# Patient Record
Sex: Male | Born: 1955 | Race: White | Hispanic: No | Marital: Married | Smoking: Current every day smoker
Health system: Southern US, Community
[De-identification: ages and names within clinical notes are randomized; demographics above are authoritative.]

## PROBLEM LIST (undated history)

## (undated) DIAGNOSIS — M1711 Unilateral primary osteoarthritis, right knee: Secondary | ICD-10-CM

## (undated) DIAGNOSIS — F329 Major depressive disorder, single episode, unspecified: Secondary | ICD-10-CM

## (undated) DIAGNOSIS — K259 Gastric ulcer, unspecified as acute or chronic, without hemorrhage or perforation: Secondary | ICD-10-CM

## (undated) DIAGNOSIS — Z87891 Personal history of nicotine dependence: Secondary | ICD-10-CM

## (undated) DIAGNOSIS — F32A Depression, unspecified: Secondary | ICD-10-CM

## (undated) DIAGNOSIS — I10 Essential (primary) hypertension: Secondary | ICD-10-CM

## (undated) DIAGNOSIS — I739 Peripheral vascular disease, unspecified: Secondary | ICD-10-CM

## (undated) DIAGNOSIS — M7070 Other bursitis of hip, unspecified hip: Secondary | ICD-10-CM

## (undated) DIAGNOSIS — M179 Osteoarthritis of knee, unspecified: Secondary | ICD-10-CM

## (undated) DIAGNOSIS — G4730 Sleep apnea, unspecified: Secondary | ICD-10-CM

## (undated) DIAGNOSIS — Z860100 Personal history of colon polyps, unspecified: Secondary | ICD-10-CM

## (undated) DIAGNOSIS — M549 Dorsalgia, unspecified: Secondary | ICD-10-CM

## (undated) DIAGNOSIS — K2210 Ulcer of esophagus without bleeding: Secondary | ICD-10-CM

## (undated) DIAGNOSIS — E785 Hyperlipidemia, unspecified: Secondary | ICD-10-CM

## (undated) DIAGNOSIS — E7800 Pure hypercholesterolemia, unspecified: Secondary | ICD-10-CM

## (undated) DIAGNOSIS — J45909 Unspecified asthma, uncomplicated: Secondary | ICD-10-CM

## (undated) DIAGNOSIS — M1710 Unilateral primary osteoarthritis, unspecified knee: Secondary | ICD-10-CM

## (undated) DIAGNOSIS — M1990 Unspecified osteoarthritis, unspecified site: Secondary | ICD-10-CM

## (undated) DIAGNOSIS — E119 Type 2 diabetes mellitus without complications: Secondary | ICD-10-CM

## (undated) DIAGNOSIS — K219 Gastro-esophageal reflux disease without esophagitis: Secondary | ICD-10-CM

## (undated) HISTORY — PX: ORIF FINGER FRACTURE: SHX2122

## (undated) HISTORY — DX: Osteoarthritis of knee, unspecified: M17.9

## (undated) HISTORY — DX: Other bursitis of hip, unspecified hip: M70.70

## (undated) HISTORY — PX: TONSILLECTOMY: SUR1361

## (undated) HISTORY — DX: Dorsalgia, unspecified: M54.9

## (undated) HISTORY — PX: FINGER AMPUTATION: SHX636

## (undated) HISTORY — DX: Peripheral vascular disease, unspecified: I73.9

## (undated) HISTORY — DX: Unilateral primary osteoarthritis, unspecified knee: M17.10

## (undated) HISTORY — PX: JOINT REPLACEMENT: SHX530

## (undated) HISTORY — PX: CARPAL TUNNEL RELEASE: SHX101

## (undated) HISTORY — DX: Hyperlipidemia, unspecified: E78.5

## (undated) HISTORY — PX: KNEE ARTHROSCOPY: SUR90

## (undated) HISTORY — DX: Personal history of nicotine dependence: Z87.891

---

## 2001-01-21 ENCOUNTER — Emergency Department (HOSPITAL_COMMUNITY): Admission: EM | Admit: 2001-01-21 | Discharge: 2001-01-21 | Payer: Self-pay

## 2001-01-22 ENCOUNTER — Encounter: Payer: Self-pay | Admitting: Emergency Medicine

## 2001-01-23 ENCOUNTER — Observation Stay (HOSPITAL_COMMUNITY): Admission: EM | Admit: 2001-01-23 | Discharge: 2001-01-24 | Payer: Self-pay | Admitting: Emergency Medicine

## 2001-11-19 ENCOUNTER — Emergency Department (HOSPITAL_COMMUNITY): Admission: EM | Admit: 2001-11-19 | Discharge: 2001-11-19 | Payer: Self-pay | Admitting: Internal Medicine

## 2002-10-27 ENCOUNTER — Emergency Department (HOSPITAL_COMMUNITY): Admission: EM | Admit: 2002-10-27 | Discharge: 2002-10-27 | Payer: Self-pay | Admitting: *Deleted

## 2003-02-05 ENCOUNTER — Ambulatory Visit (HOSPITAL_BASED_OUTPATIENT_CLINIC_OR_DEPARTMENT_OTHER): Admission: RE | Admit: 2003-02-05 | Discharge: 2003-02-05 | Payer: Self-pay | Admitting: Orthopedic Surgery

## 2005-07-06 ENCOUNTER — Emergency Department (HOSPITAL_COMMUNITY): Admission: EM | Admit: 2005-07-06 | Discharge: 2005-07-07 | Payer: Self-pay | Admitting: Emergency Medicine

## 2006-08-29 ENCOUNTER — Emergency Department (HOSPITAL_COMMUNITY): Admission: EM | Admit: 2006-08-29 | Discharge: 2006-08-29 | Payer: Self-pay | Admitting: Emergency Medicine

## 2011-03-17 ENCOUNTER — Ambulatory Visit: Payer: Self-pay | Admitting: Gastroenterology

## 2011-03-18 NOTE — Telephone Encounter (Signed)
Filed in error

## 2011-04-07 ENCOUNTER — Ambulatory Visit (HOSPITAL_COMMUNITY)
Admission: RE | Admit: 2011-04-07 | Discharge: 2011-04-07 | Disposition: A | Discharge status: Home / Self Care | Payer: Medicaid Other | Source: Ambulatory Visit | Attending: Family Medicine | Admitting: Family Medicine

## 2011-04-07 ENCOUNTER — Other Ambulatory Visit: Payer: Self-pay | Admitting: Family Medicine

## 2011-04-07 DIAGNOSIS — R0789 Other chest pain: Secondary | ICD-10-CM | POA: Insufficient documentation

## 2011-12-28 ENCOUNTER — Other Ambulatory Visit: Payer: Self-pay

## 2011-12-28 ENCOUNTER — Telehealth: Payer: Self-pay

## 2011-12-28 DIAGNOSIS — Z139 Encounter for screening, unspecified: Secondary | ICD-10-CM

## 2011-12-28 NOTE — Telephone Encounter (Signed)
OK to proceed with colonoscopy.

## 2011-12-28 NOTE — Telephone Encounter (Signed)
Gastroenterology Pre-Procedure Form  Pt said he does not use any alcoholic beverages  Request Date: 12/28/2011      Requesting Physician: Dr. Lilyan Punt     PATIENT INFORMATION:  Jim [name] is a 56 y.o., male (DOB=1956-08-01).  PROCEDURE: Procedure(s) requested: colonoscopy Procedure Reason: screening for colon cancer  PATIENT REVIEW QUESTIONS: The patient reports the following:   1. Diabetes Melitis: no 2. Joint replacements in the past 12 months: no 3. Major health problems in the past 3 months: no 4. Has an artificial valve or MVP:no 5. Has been advised in past to take antibiotics in advance of a procedure like teeth cleaning: no}    MEDICATIONS & ALLERGIES:    Patient reports the following regarding taking any blood thinners:   Plavix? no Aspirin?no Coumadin?  no  Patient confirms/reports the following medications:  Current Outpatient Prescriptions  Medication Sig Dispense Refill  . citalopram (CELEXA) 20 MG tablet Take 20 mg by mouth daily.      Marland Kitchen HYDROcodone-acetaminophen (NORCO) 10-325 MG per tablet Take 1 tablet by mouth every 6 (six) hours as needed. Pt said he takes 5-6 tablets daily for arthritis   ( neck, shoulders, back and knees)      . Multiple Vitamin (MULTIVITAMIN) tablet Take 1 tablet by mouth daily.        Patient confirms/reports the following allergies:  No Known Allergies  Patient is appropriate to schedule for requested procedure(s): yes  AUTHORIZATION INFORMATION Primary Insurance:   ID #:  Group #:  Pre-Cert / Auth required:  Pre-Cert / Auth #:   Secondary Insurance:   ID #:   Group # Pre-Cert / Auth required:  Pre-Cert / Auth #:   No orders of the defined types were placed in this encounter.    SCHEDULE INFORMATION: Procedure has been scheduled as follows:  Date: 01/31/2012    Time: 7:30 AM  Location: [facility] Stay  This Gastroenterology Pre-Precedure Form is being routed to the following provider(s) for review: R.  Roetta Sessions, MD

## 2011-12-30 MED ORDER — PEG 3350-KCL-NA BICARB-NACL 420 G PO SOLR: ORAL | Status: AC

## 2011-12-30 NOTE — Telephone Encounter (Signed)
Rx and instructions mailed.  

## 2012-01-20 ENCOUNTER — Encounter (HOSPITAL_COMMUNITY): Payer: Self-pay | Admitting: Pharmacy Technician

## 2012-01-26 ENCOUNTER — Telehealth: Payer: Self-pay

## 2012-01-26 NOTE — Telephone Encounter (Signed)
OK to proceed with colonoscopy.

## 2012-01-26 NOTE — Telephone Encounter (Signed)
Called pt and he has not had any new problems and no new medications. He is scheduled for his colonoscopy on 01/31/2012 with Dr. Jena Gauss. He has received his prescription and his instructions.

## 2012-01-26 NOTE — Telephone Encounter (Signed)
LMOM to call. Need to update the triage prior to procedure on 01/31/2012 with RMR.

## 2012-01-31 ENCOUNTER — Ambulatory Visit (HOSPITAL_COMMUNITY)
Admission: RE | Admit: 2012-01-31 | Discharge: 2012-01-31 | Disposition: A | Discharge status: Home Health | Payer: Medicaid Other | Source: Ambulatory Visit | Attending: Internal Medicine | Admitting: Internal Medicine

## 2012-01-31 ENCOUNTER — Encounter (HOSPITAL_COMMUNITY): Payer: Self-pay | Admitting: *Deleted

## 2012-01-31 ENCOUNTER — Encounter (HOSPITAL_COMMUNITY)
Admission: RE | Disposition: A | Discharge status: Home Health | Payer: Self-pay | Source: Ambulatory Visit | Attending: Internal Medicine

## 2012-01-31 DIAGNOSIS — D128 Benign neoplasm of rectum: Secondary | ICD-10-CM | POA: Insufficient documentation

## 2012-01-31 DIAGNOSIS — Z1211 Encounter for screening for malignant neoplasm of colon: Secondary | ICD-10-CM | POA: Insufficient documentation

## 2012-01-31 DIAGNOSIS — Z139 Encounter for screening, unspecified: Secondary | ICD-10-CM

## 2012-01-31 DIAGNOSIS — D126 Benign neoplasm of colon, unspecified: Secondary | ICD-10-CM | POA: Insufficient documentation

## 2012-01-31 DIAGNOSIS — K620 Anal polyp: Secondary | ICD-10-CM

## 2012-01-31 DIAGNOSIS — K621 Rectal polyp: Secondary | ICD-10-CM

## 2012-01-31 HISTORY — DX: Gastric ulcer, unspecified as acute or chronic, without hemorrhage or perforation: K25.9

## 2012-01-31 HISTORY — DX: Unspecified osteoarthritis, unspecified site: M19.90

## 2012-01-31 HISTORY — PX: COLONOSCOPY: SHX5424

## 2012-01-31 HISTORY — DX: Depression, unspecified: F32.A

## 2012-01-31 HISTORY — DX: Major depressive disorder, single episode, unspecified: F32.9

## 2012-01-31 SURGERY — COLONOSCOPY
Anesthesia: Moderate Sedation

## 2012-01-31 MED ORDER — MIDAZOLAM HCL 5 MG/5ML IJ SOLN
INTRAMUSCULAR | Status: DC | PRN
  Administered 2012-01-31: 2 mg via INTRAVENOUS
  Administered 2012-01-31: 1 mg via INTRAVENOUS
  Administered 2012-01-31: 2 mg via INTRAVENOUS

## 2012-01-31 MED ORDER — MEPERIDINE HCL 100 MG/ML IJ SOLN
INTRAMUSCULAR | Status: DC | PRN
  Administered 2012-01-31 (×2): 50 mg via INTRAVENOUS
  Administered 2012-01-31: 25 mg via INTRAVENOUS

## 2012-01-31 MED ORDER — SODIUM CHLORIDE 0.45 % IV SOLN
Freq: Once | INTRAVENOUS | Status: AC
  Administered 2012-01-31: 1000 mL via INTRAVENOUS

## 2012-01-31 MED ORDER — MEPERIDINE HCL 100 MG/ML IJ SOLN
INTRAMUSCULAR | Status: AC
  Filled 2012-01-31: qty 2

## 2012-01-31 MED ORDER — MIDAZOLAM HCL 5 MG/5ML IJ SOLN
INTRAMUSCULAR | Status: AC
  Filled 2012-01-31: qty 10

## 2012-01-31 MED ORDER — STERILE WATER FOR IRRIGATION IR SOLN
Status: DC | PRN
  Administered 2012-01-31: 08:00:00

## 2012-01-31 NOTE — Discharge Instructions (Signed)
Colonoscopy Discharge Instructions  Read the instructions outlined below and refer to this sheet in the next few weeks. These discharge instructions provide you with general information on caring for yourself after you leave the hospital. Your doctor may also give you specific instructions. While your treatment has been planned according to the most current medical practices available, unavoidable complications occasionally occur. If you have any problems or questions after discharge, call Dr. Rourk at [phone]. ACTIVITY  You may resume your regular activity, but move at a slower pace for the next 24 hours.   Take frequent rest periods for the next 24 hours.   Walking will help get rid of the air and reduce the bloated feeling in your belly (abdomen).   No driving for 24 hours (because of the medicine (anesthesia) used during the test).    Do not sign any important legal documents or operate any machinery for 24 hours (because of the anesthesia used during the test).  NUTRITION  Drink plenty of fluids.   You may resume your normal diet as instructed by your doctor.   Begin with a light meal and progress to your normal diet. Heavy or fried foods are harder to digest and may make you feel sick to your stomach (nauseated).   Avoid alcoholic beverages for 24 hours or as instructed.  MEDICATIONS  You may resume your normal medications unless your doctor tells you otherwise.  WHAT YOU CAN EXPECT TODAY  Some feelings of bloating in the abdomen.   Passage of more gas than usual.   Spotting of blood in your stool or on the toilet paper.  IF YOU HAD POLYPS REMOVED DURING THE COLONOSCOPY:  No aspirin products for 7 days or as instructed.   No alcohol for 7 days or as instructed.   Eat a soft diet for the next 24 hours.  FINDING OUT THE RESULTS OF YOUR TEST Not all test results are available during your visit. If your test results are not back during the visit, make an appointment  with your caregiver to find out the results. Do not assume everything is normal if you have not heard from your caregiver or the medical facility. It is important for you to follow up on all of your test results.  SEEK IMMEDIATE MEDICAL ATTENTION IF:  You have more than a spotting of blood in your stool.   Your belly is swollen (abdominal distention).   You are nauseated or vomiting.   You have a temperature over 101.   You have abdominal pain or discomfort that is severe or gets worse throughout the day.    Polyp information provided.  Further recommendations to follow pending review of pathology report.  Colon Polyps A polyp is extra tissue that grows inside your body. Colon polyps grow in the large intestine. The large intestine, also called the colon, is part of your digestive system. It is a long, hollow tube at the end of your digestive tract where your body makes and stores stool. Most polyps are not dangerous. They are benign. This means they are not cancerous. But over time, some types of polyps can turn into cancer. Polyps that are smaller than a pea are usually not harmful. But larger polyps could someday become or may already be cancerous. To be safe, doctors remove all polyps and test them.  WHO GETS POLYPS? Anyone can get polyps, but certain people are more likely than others. You may have a greater chance of getting polyps if:    You are over 50.   You have had polyps before.   Someone in your family has had polyps.   Someone in your family has had cancer of the large intestine.   Find out if someone in your family has had polyps. You may also be more likely to get polyps if you:   Eat a lot of fatty foods.   Smoke.   Drink alcohol.   Do not exercise.   Eat too much.  SYMPTOMS  Most small polyps do not cause symptoms. People often do not know they have one until their caregiver finds it during a regular checkup or while testing them for something else. Some  people do have symptoms like these:  Bleeding from the anus. You might notice blood on your underwear or on toilet paper after you have had a bowel movement.   Constipation or diarrhea that lasts more than a week.   Blood in the stool. Blood can make stool look black or it can show up as red streaks in the stool.  If you have any of these symptoms, see your caregiver. HOW DOES THE DOCTOR TEST FOR POLYPS? The doctor can use four tests to check for polyps:  Digital rectal exam. The caregiver wears gloves and checks your rectum (the last part of the large intestine) to see if it feels normal. This test would find polyps only in the rectum. Your caregiver may need to do one of the other tests listed below to find polyps higher up in the intestine.   Barium enema. The caregiver puts a liquid called barium into your rectum before taking x-rays of your large intestine. Barium makes your intestine look white in the pictures. Polyps are dark, so they are easy to see.   Sigmoidoscopy. With this test, the caregiver can see inside your large intestine. A thin flexible tube is placed into your rectum. The device is called a sigmoidoscope, which has a light and a tiny video camera in it. The caregiver uses the sigmoidoscope to look at the last third of your large intestine.   Colonoscopy. This test is like sigmoidoscopy, but the caregiver looks at all of the large intestine. It usually requires sedation. This is the most common method for finding and removing polyps.  TREATMENT   The caregiver will remove the polyp during sigmoidoscopy or colonoscopy. The polyp is then tested for cancer.   If you have had polyps, your caregiver may want you to get tested regularly in the future.  PREVENTION  There is not one sure way to prevent polyps. You might be able to lower your risk of getting them if you:  Eat more fruits and vegetables and less fatty food.   Do not smoke.   Avoid alcohol.   Exercise every  day.   Lose weight if you are overweight.   Eating more calcium and folate can also lower your risk of getting polyps. Some foods that are rich in calcium are milk, cheese, and broccoli. Some foods that are rich in folate are chickpeas, kidney beans, and spinach.   Aspirin might help prevent polyps. Studies are under way.  Document Released: 04/14/2004 Document Revised: 07/08/2011 Document Reviewed: 09/20/2007 [employer] Patient Information 2012 [employer]. 

## 2012-01-31 NOTE — H&P (Signed)
Primary Care Physician:  Lilyan Punt, MD Primary Gastroenterologist:  Dr. Jena Gauss  Pre-Procedure History & Physical: HPI:  Jim [name] is a 56 y.o. male is here for a screening colonoscopy. No bowel symptoms. No family history of colon polyps or colon cancer. No prior colonoscopy.  Past Medical History  Diagnosis Date  . Depression   . Gastric ulcer   . Arthritis     Past Surgical History  Procedure Date  . Knee arthroscopy     right knee  . Carpal tunnel release     right  . Tonsillectomy   . 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 Authorizing Provider  citalopram (CELEXA) 20 MG tablet Take 20 mg by mouth daily.   Yes Historical Provider, MD  HYDROcodone-acetaminophen (NORCO) 10-325 MG per tablet Take 1 tablet by mouth every 6 (six) hours as needed. For pain. Pt states he takes 5-6 tablets daily for arthritis   ( neck, shoulders, back and knees)   Yes Historical Provider, MD  Multiple Vitamin (MULTIVITAMIN WITH MINERALS) TABS Take 1 tablet by mouth daily.   Yes Historical Provider, MD  zolpidem (AMBIEN) 10 MG tablet Take 10 mg by mouth at bedtime as needed. For sleep   Yes Historical Provider, MD    Allergies as of 12/28/2011  . (No Known Allergies)    Family History  Problem Relation Age of Onset  . Heart disease Mother   . Heart disease Father   . Diabetes type I Father   . Heart disease Sister   . Heart disease Brother     History   Social History  . Marital Status: Legally Separated    Spouse Name: N/A    Number of Children: N/A  . Years of Education: N/A   Occupational History  . Not on file.   Social History Main Topics  . Smoking status: Former Smoker    Quit date: 10/31/2011  . Smokeless tobacco: Not on file  . Alcohol Use: No  . Drug Use: No  . Sexually Active: Yes   Other Topics Concern  . Not on file   Social History Narrative  . No narrative on  file    Review of Systems: See HPI, otherwise negative ROS  Physical Exam: BP 136/86  Pulse 65  Temp 97.4 F (36.3 C) (Oral)  Resp 20  Ht 5\' 9"  (1.753 m)  Wt 210 lb (95.255 kg)  BMI 31.01 kg/m2  SpO2 94% General:   Alert,  Well-developed, somewhat disheveled pleasant and cooperative in NAD Head:  Normocephalic and atraumatic. Eyes:  Sclera clear, no icterus.   Conjunctiva pink. Ears:  Normal auditory acuity. Nose:  No deformity, discharge,  or lesions. Mouth:  No deformity or lesions, poor dentition. Neck:  Supple; no masses or thyromegaly. Lungs:  Clear throughout to auscultation.   No wheezes, crackles, or rhonchi. No acute distress. Heart:  Regular rate and rhythm; no murmurs, clicks, rubs,  or gallops. Abdomen:  Soft, nontender and nondistended. No masses, hepatosplenomegaly or hernias noted. Normal bowel sounds, without guarding, and without rebound.   Msk:  Symmetrical without gross deformities. Normal posture. Pulses:  Normal pulses noted. Extremities:  Without clubbing or edema. Neurologic:  Alert and  oriented x4;  grossly normal neurologically. Skin:  Intact without significant lesions or rashes. Cervical Nodes:  No significant cervical adenopathy. Psych:  Alert and cooperative. Normal mood and affect.  Impression/Plan: Jim [name] is now here to undergo a screening colonoscopy. First-ever average risk screening examination.  Risks, benefits, limitations, imponderables and alternatives regarding colonoscopy have been reviewed with the patient. Questions have been answered. All parties agreeable.

## 2012-01-31 NOTE — Op Note (Signed)
[facility] [address] [place], [place]  [address]  COLONOSCOPY PROCEDURE REPORT  PATIENT:  Jim [name], Jim [name]  MR#:  [id number] BIRTHDATE:  January 20, 1956, 56 yrs. old  GENDER:  male ENDOSCOPIST:  R. Roetta Sessions, MD FACP [facility] REF. BY:  Lilyan Punt, M.D. PROCEDURE DATE:  01/31/2012 PROCEDURE:  Colonoscopy with snare polypectomy and polyp ablation  INDICATIONS:  First-ever average risk screening colonoscopy  INFORMED CONSENT:  The risks, benefits, alternatives and imponderables including but not limited to bleeding, perforation as well as the possibility of a missed lesion have been reviewed. The potential for biopsy, lesion removal, etc. have also been discussed.  Questions have been answered.  All parties agreeable. Please see the history and physical in the medical record for more information.  MEDICATIONS:  Versed 5 mg IV and Demerol 125 mg IV in divided doses.  DESCRIPTION OF PROCEDURE:  After a digital rectal exam was performed, the EC-[id number] ([id number]) colonoscope was advanced from the anus through the rectum and colon to the area of the cecum, ileocecal valve and appendiceal orifice.  The cecum was deeply intubated.  These structures were well-seen and photographed for the record.  From the level of the cecum and ileocecal valve, the scope was slowly and cautiously withdrawn.  The mucosal surfaces were carefully surveyed utilizing scope tip deflection to facilitate fold flattening as needed.  The scope was pulled down into the rectum where a thorough examination including retroflexion was performed. <<PROCEDUREIMAGES>>  FINDINGS: Adequate preparation. 7 mm polyp in the rectum at 5 cm. Multiple adjacent diminutive polyps. Colonic polyps in the cecum, ascending  and sigmoid segments; the remainder of the colonic mucosa appeared normal.  THERAPEUTIC / DIAGNOSTIC MANEUVERS PERFORMED:  Above-mentioned polyps were hot snare removed. There were diminutive polyps in  the rectum and sigmoid segments which were ablated with the tip of the hot snare.  COMPLICATIONS:  None  CECAL WITHDRAWAL TIME: 17 minutes  IMPRESSION:  Multiple colonic and rectal polyps-removed/treated as described above  RECOMMENDATIONS:  Followup on pathology.  ______________________________ R. Roetta Sessions, MD Caleen Essex  CC:  Lilyan Punt, M.D.  n. eSIGNED:   R. Roetta Sessions at 01/31/2012 08:29 AM  Randol Kern, [phone]

## 2012-02-05 ENCOUNTER — Encounter: Payer: Self-pay | Admitting: Internal Medicine

## 2012-02-07 ENCOUNTER — Encounter (HOSPITAL_COMMUNITY): Payer: Self-pay | Admitting: Internal Medicine

## 2012-08-21 ENCOUNTER — Encounter (HOSPITAL_COMMUNITY): Payer: Self-pay | Admitting: Pharmacy Technician

## 2012-08-21 ENCOUNTER — Other Ambulatory Visit (HOSPITAL_COMMUNITY): Payer: Medicaid Other

## 2012-08-23 ENCOUNTER — Encounter: Payer: Self-pay | Admitting: Physician Assistant

## 2012-08-23 ENCOUNTER — Other Ambulatory Visit: Payer: Self-pay | Admitting: Physician Assistant

## 2012-08-23 DIAGNOSIS — E785 Hyperlipidemia, unspecified: Secondary | ICD-10-CM

## 2012-08-23 DIAGNOSIS — K259 Gastric ulcer, unspecified as acute or chronic, without hemorrhage or perforation: Secondary | ICD-10-CM

## 2012-08-23 DIAGNOSIS — M1710 Unilateral primary osteoarthritis, unspecified knee: Secondary | ICD-10-CM | POA: Insufficient documentation

## 2012-08-23 DIAGNOSIS — Z87891 Personal history of nicotine dependence: Secondary | ICD-10-CM | POA: Insufficient documentation

## 2012-08-23 DIAGNOSIS — M1990 Unspecified osteoarthritis, unspecified site: Secondary | ICD-10-CM | POA: Insufficient documentation

## 2012-08-23 DIAGNOSIS — M179 Osteoarthritis of knee, unspecified: Secondary | ICD-10-CM | POA: Insufficient documentation

## 2012-08-23 DIAGNOSIS — F32A Depression, unspecified: Secondary | ICD-10-CM

## 2012-08-23 DIAGNOSIS — F329 Major depressive disorder, single episode, unspecified: Secondary | ICD-10-CM

## 2012-08-23 DIAGNOSIS — M7070 Other bursitis of hip, unspecified hip: Secondary | ICD-10-CM

## 2012-08-23 DIAGNOSIS — M549 Dorsalgia, unspecified: Secondary | ICD-10-CM

## 2012-08-23 NOTE — H&P (Signed)
TOTAL KNEE ADMISSION H&P  Patient is being admitted for left total knee arthroplasty.  Subjective:  Chief Complaint:left knee pain.  HPI: Jim [name], 57 y.o. male, has a history of pain and functional disability in the left knee due to arthritis and has failed non-surgical conservative treatments for greater than 12 weeks to includeNSAID's and/or analgesics, corticosteriod injections, viscosupplementation injections, flexibility and strengthening excercises, use of assistive devices, weight reduction as appropriate and activity modification.  Onset of symptoms was gradual, starting 5 years ago with gradually worsening course since that time. The patient noted no past surgery on the left knee(s).  Patient currently rates pain in the left knee(s) at 10 out of 10 with activity. Patient has night pain, worsening of pain with activity and weight bearing, pain that interferes with activities of daily living, crepitus and joint swelling.  Patient has evidence of subchondral sclerosis, periarticular osteophytes and joint space narrowing by imaging studies. There is no active infection.  There are no active problems to display for this patient.  Past Medical History  Diagnosis Date  . Depression   . Gastric ulcer   . Arthritis   . Hyperlipidemia   . Bursitis of hip     Bilateral  . 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 Ex-smoker     Quit 11/07/2011    Past Surgical History  Procedure Date  . Knee arthroscopy     right knee  . Carpal tunnel release     right  . Tonsillectomy   . 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 Colonoscopy 01/31/2012    Procedure: COLONOSCOPY;  Surgeon: Corbin Ade, MD;  Location: [facility];  Service: Endoscopy;  Laterality: N/A;  7:30 AM  . Orif finger fracture     Right 5th finger, Amputation reatachment     (Not in a hospital admission) No Known Allergies   Current Outpatient Prescriptions on File Prior to  Visit  Medication Sig Dispense Refill  . citalopram (CELEXA) 20 MG tablet Take 20 mg by mouth daily.      Marland Kitchen oxyCODONE-acetaminophen (PERCOCET) 10-325 MG per tablet Take 1 tablet by mouth every 4 (four) hours as needed. Pain      . Multiple Vitamin (MULTIVITAMIN WITH MINERALS) TABS Take 1 tablet by mouth daily.         History  Substance Use Topics  . Smoking status: Former Smoker -- 2.0 packs/day for 40 years    Types: Cigarettes    Quit date: 10/31/2011  . Smokeless tobacco: Not on file  . Alcohol Use: Yes     Comment: 1 beer every 3-4 months    Family History  Problem Relation Age of Onset  . Heart disease Mother   . Heart disease Father   . Diabetes type I Father   . Heart disease Sister   . Heart disease Brother   . Hypertension Father   . Cancer Father      Review of Systems  Constitutional: Negative.  Negative for fever, chills and weight loss.  HENT: Negative.   Eyes: Negative.   Respiratory: Negative.   Cardiovascular: Negative.   Gastrointestinal: Negative.   Musculoskeletal: Positive for back pain and joint pain.       Left knee   Skin: Negative.   Neurological: Negative.   Endo/Heme/Allergies: Negative.   Psychiatric/Behavioral: Negative.     Objective:  Physical Exam  Vitals reviewed. Constitutional:  He is oriented to person, place, and time. He appears well-developed and well-nourished.  HENT:  Head: Normocephalic and atraumatic.  Eyes: Pupils are equal, round, and reactive to light.  Neck: Normal range of motion. Neck supple.  Cardiovascular: Normal rate, regular rhythm and normal heart sounds.   Respiratory: Effort normal and breath sounds normal. He has no wheezes.  GI: Soft. Bowel sounds are normal. There is no tenderness.  Genitourinary:       Not pertinent to current symptomatology therefore not examined.   Musculoskeletal: He exhibits tenderness.       Examination of both knees reveals moderate varus deformity bilaterally 1+ crepitation  1+ synovitis range of motion -5 to 115 degrees with diffuse pain, both knees are stable with normal patella tracking. Vascular exam: pulses 2+ and symmetric.  Neurological: He is alert and oriented to person, place, and time.  Skin: Skin is warm and dry.  Psychiatric: He has a normal mood and affect.    Vital signs in last 24 hours: Last recorded: 01/22 1500   BP: 147/94 Pulse: 94  Temp: 98.5 F (36.9 C)    Height: 5\' 9"  (1.753 m) SpO2: 97  Weight: 102.967 kg (227 lb)     Labs:   Estimated Body mass index is 33.52 kg/(m^2) as calculated from the following:   Height as of this encounter: 5\' 9" (1.753 m).   Weight as of this encounter: 227 lb(102.967 kg).   Imaging Review Plain radiographs demonstrate severe degenerative joint disease of the left knee(s). The overall alignment issignificant varus. The bone quality appears to be good for age and reported activity level. Both knees standing AP lateral flexion and sunrise views show significant varus end stage degenerative joint disease with bone on bone arthritis and periarticular osteophytes and spurring and a large anterior loose body in the left knee. Assessment/Plan:  End stage arthritis, left knee  Patient Active Problem List  Diagnosis  . Depression  . Gastric ulcer  . Arthritis  . Hyperlipidemia  . Bursitis of hip  . DJD (degenerative joint disease) of knee  . Ex-smoker  . Back pain   The patient history, physical examination, clinical judgment of the provider and imaging studies are consistent with end stage degenerative joint disease of the left knee(s) and total knee arthroplasty is deemed medically necessary. The treatment options including medical management, injection therapy arthroscopy and arthroplasty were discussed at length. The risks and benefits of total knee arthroplasty were presented and reviewed. The risks due to aseptic loosening, infection, stiffness, patella tracking problems, thromboembolic  complications and other imponderables were discussed. The patient acknowledged the explanation, agreed to proceed with the plan and consent was signed. Patient is being admitted for inpatient treatment for surgery, pain control, PT, OT, prophylactic antibiotics, VTE prophylaxis, progressive ambulation and ADL's and discharge planning. The patient is planning to be discharged home with home health services  Asuna Peth A. Gwinda Passe Physician Assistant [facility] [phone]  08/23/2012, 4:27 PM

## 2012-08-29 ENCOUNTER — Encounter (HOSPITAL_COMMUNITY)
Admission: RE | Admit: 2012-08-29 | Discharge: 2012-08-29 | Disposition: A | Discharge status: Home / Self Care | Payer: Medicaid Other | Source: Ambulatory Visit | Attending: Orthopedic Surgery | Admitting: Orthopedic Surgery

## 2012-08-29 ENCOUNTER — Encounter (HOSPITAL_COMMUNITY): Payer: Self-pay

## 2012-08-29 ENCOUNTER — Ambulatory Visit (HOSPITAL_COMMUNITY)
Admission: RE | Admit: 2012-08-29 | Discharge: 2012-08-29 | Disposition: A | Discharge status: Home / Self Care | Payer: Medicaid Other | Source: Ambulatory Visit | Attending: Physician Assistant | Admitting: Physician Assistant

## 2012-08-29 HISTORY — DX: Personal history of colonic polyps: Z86.010

## 2012-08-29 HISTORY — DX: Personal history of colon polyps, unspecified: Z86.0100

## 2012-08-29 LAB — URINALYSIS, ROUTINE W REFLEX MICROSCOPIC
Bilirubin Urine: NEGATIVE
Hgb urine dipstick: NEGATIVE
Leukocytes, UA: NEGATIVE
Specific Gravity, Urine: 1.02 (ref 1.005–1.030)
pH: 5 (ref 5.0–8.0)

## 2012-08-29 LAB — SURGICAL PCR SCREEN
MRSA, PCR: NEGATIVE
Staphylococcus aureus: NEGATIVE

## 2012-08-29 LAB — PROTIME-INR: Prothrombin Time: 12.7 seconds (ref 11.6–15.2)

## 2012-08-29 LAB — COMPREHENSIVE METABOLIC PANEL
Alkaline Phosphatase: 55 U/L (ref 39–117)
BUN: 12 mg/dL (ref 6–23)
Chloride: 102 mEq/L (ref 96–112)
GFR calc Af Amer: >90 mL/min (ref >90)
GFR calc non Af Amer: >90 mL/min (ref >90)
Glucose, Bld: 98 mg/dL (ref 70–99)
Potassium: 4 mEq/L (ref 3.5–5.1)
Total Bilirubin: 0.2 mg/dL — ABNORMAL LOW (ref 0.3–1.2)

## 2012-08-29 LAB — ABO/RH: ABO/RH(D): O POS

## 2012-08-29 LAB — CBC WITH DIFFERENTIAL/PLATELET
Basophils Relative: 0 % (ref 0–1)
HCT: 45.8 % (ref 39.0–52.0)
Hemoglobin: 15.4 g/dL (ref 13.0–17.0)
Lymphocytes Relative: 24 % (ref 12–46)
MCHC: 33.6 g/dL (ref 30.0–36.0)
MCV: 93.5 fL (ref 78.0–100.0)
Monocytes Relative: 9 % (ref 3–12)
Neutro Abs: 7.6 10*3/uL (ref 1.7–7.7)
WBC: 11.7 10*3/uL — ABNORMAL HIGH (ref 4.0–10.5)

## 2012-08-29 LAB — APTT: aPTT: 30 seconds (ref 24–37)

## 2012-08-29 NOTE — Pre-Procedure Instructions (Signed)
Jim [name]  08/29/2012   Your procedure is scheduled on:  Monday, February 3  Report to [facility] at 0700 AM.  Call this number if you have problems the morning of surgery: [phone]   Remember:   Do not eat food or drink liquids after midnight.Sunday night   Take these medicines the morning of surgery with A SIP OF WATER: Oxycodone   Do not wear jewelry, make-up or nail polish.  Do not wear lotions, powders, or perfumes. You may wear deodorant.  Do not shave 48 hours prior to surgery. Men may shave face and neck.  Do not bring valuables to the hospital.  Contacts, dentures or bridgework may not be worn into surgery.  Leave suitcase in the car. After surgery it may be brought to your room.  For patients admitted to the hospital, checkout time is 11:00 AM the day of  discharge.   Special Instructions: Incentive Spirometry - Practice and bring it with you on the day of surgery. Shower using CHG 2 nights before surgery and the night before surgery.  If you shower the day of surgery use CHG.  Use special wash - you have one bottle of CHG for all showers.  You should use approximately 1/3 of the bottle for each shower.   Please read over the following fact sheets that you were given: Pain Booklet, Coughing and Deep Breathing, Blood Transfusion Information, Total Joint Packet, MRSA Information and Surgical Site Infection Prevention

## 2012-08-30 LAB — URINE CULTURE
Colony Count: NO GROWTH
Culture: NO GROWTH

## 2012-09-03 MED ORDER — CEFAZOLIN SODIUM-DEXTROSE 2-3 GM-% IV SOLR
2.0000 g | INTRAVENOUS | Status: AC
  Administered 2012-09-04: 2 g via INTRAVENOUS

## 2012-09-04 ENCOUNTER — Encounter (HOSPITAL_COMMUNITY)
Admission: RE | Disposition: A | Discharge status: Home Health | Payer: Self-pay | Source: Ambulatory Visit | Attending: Orthopedic Surgery

## 2012-09-04 ENCOUNTER — Encounter (HOSPITAL_COMMUNITY): Payer: Self-pay | Admitting: *Deleted

## 2012-09-04 ENCOUNTER — Inpatient Hospital Stay (HOSPITAL_COMMUNITY)
Admission: RE | Admit: 2012-09-04 | Discharge: 2012-09-05 | DRG: 470 | Disposition: A | Discharge status: Home Health | Payer: Medicaid Other | Source: Ambulatory Visit | Attending: Orthopedic Surgery | Admitting: Orthopedic Surgery

## 2012-09-04 ENCOUNTER — Encounter (HOSPITAL_COMMUNITY): Payer: Self-pay | Admitting: Vascular Surgery

## 2012-09-04 ENCOUNTER — Ambulatory Visit (HOSPITAL_COMMUNITY): Payer: Medicaid Other | Admitting: Vascular Surgery

## 2012-09-04 DIAGNOSIS — F32A Depression, unspecified: Secondary | ICD-10-CM | POA: Diagnosis present

## 2012-09-04 DIAGNOSIS — Z01818 Encounter for other preprocedural examination: Secondary | ICD-10-CM

## 2012-09-04 DIAGNOSIS — Z860100 Personal history of colon polyps, unspecified: Secondary | ICD-10-CM

## 2012-09-04 DIAGNOSIS — Z01810 Encounter for preprocedural cardiovascular examination: Secondary | ICD-10-CM

## 2012-09-04 DIAGNOSIS — F329 Major depressive disorder, single episode, unspecified: Secondary | ICD-10-CM | POA: Diagnosis present

## 2012-09-04 DIAGNOSIS — K259 Gastric ulcer, unspecified as acute or chronic, without hemorrhage or perforation: Secondary | ICD-10-CM | POA: Diagnosis present

## 2012-09-04 DIAGNOSIS — Z79899 Other long term (current) drug therapy: Secondary | ICD-10-CM

## 2012-09-04 DIAGNOSIS — Z87891 Personal history of nicotine dependence: Secondary | ICD-10-CM

## 2012-09-04 DIAGNOSIS — F3289 Other specified depressive episodes: Secondary | ICD-10-CM | POA: Diagnosis present

## 2012-09-04 DIAGNOSIS — M549 Dorsalgia, unspecified: Secondary | ICD-10-CM | POA: Diagnosis present

## 2012-09-04 DIAGNOSIS — M7070 Other bursitis of hip, unspecified hip: Secondary | ICD-10-CM | POA: Diagnosis present

## 2012-09-04 DIAGNOSIS — Z01811 Encounter for preprocedural respiratory examination: Secondary | ICD-10-CM

## 2012-09-04 DIAGNOSIS — S68118A Complete traumatic metacarpophalangeal amputation of other finger, initial encounter: Secondary | ICD-10-CM

## 2012-09-04 DIAGNOSIS — M1990 Unspecified osteoarthritis, unspecified site: Secondary | ICD-10-CM | POA: Diagnosis present

## 2012-09-04 DIAGNOSIS — M1710 Unilateral primary osteoarthritis, unspecified knee: Principal | ICD-10-CM | POA: Diagnosis present

## 2012-09-04 DIAGNOSIS — E785 Hyperlipidemia, unspecified: Secondary | ICD-10-CM | POA: Diagnosis present

## 2012-09-04 DIAGNOSIS — Z01812 Encounter for preprocedural laboratory examination: Secondary | ICD-10-CM

## 2012-09-04 DIAGNOSIS — M179 Osteoarthritis of knee, unspecified: Secondary | ICD-10-CM | POA: Diagnosis present

## 2012-09-04 HISTORY — PX: TOTAL KNEE ARTHROPLASTY: SHX125

## 2012-09-04 LAB — CBC
Platelets: 166 10*3/uL (ref 150–400)
RBC: 4.04 MIL/uL — ABNORMAL LOW (ref 4.22–5.81)
RDW: 13.1 % (ref 11.5–15.5)
WBC: 12.8 10*3/uL — ABNORMAL HIGH (ref 4.0–10.5)

## 2012-09-04 LAB — CREATININE, SERUM
GFR calc Af Amer: >90 mL/min (ref >90)
GFR calc non Af Amer: >90 mL/min (ref >90)

## 2012-09-04 SURGERY — ARTHROPLASTY, KNEE, TOTAL
Anesthesia: Regional | Site: Knee | Laterality: Left | Wound class: Clean

## 2012-09-04 MED ORDER — BUPIVACAINE-EPINEPHRINE 0.25% -1:200000 IJ SOLN
INTRAMUSCULAR | Status: DC | PRN
  Administered 2012-09-04: 30 mL

## 2012-09-04 MED ORDER — ONDANSETRON HCL 4 MG/2ML IJ SOLN: 4.0000 mg | Freq: Four times a day (QID) | INTRAMUSCULAR | Status: DC | PRN

## 2012-09-04 MED ORDER — ADULT MULTIVITAMIN W/MINERALS CH
1.0000 | ORAL_TABLET | Freq: Every day | ORAL | Status: DC
  Administered 2012-09-04 – 2012-09-05 (×2): 1 via ORAL
  Filled 2012-09-04 (×2): qty 1

## 2012-09-04 MED ORDER — DEXAMETHASONE 4 MG PO TABS
10.0000 mg | ORAL_TABLET | Freq: Three times a day (TID) | ORAL | Status: DC
  Administered 2012-09-04 – 2012-09-05 (×2): 10 mg via ORAL
  Filled 2012-09-04 (×3): qty 1

## 2012-09-04 MED ORDER — MENTHOL 3 MG MT LOZG: 1.0000 | LOZENGE | OROMUCOSAL | Status: DC | PRN

## 2012-09-04 MED ORDER — ACETAMINOPHEN 325 MG PO TABS: 650.0000 mg | ORAL_TABLET | Freq: Four times a day (QID) | ORAL | Status: DC | PRN

## 2012-09-04 MED ORDER — DEXAMETHASONE SODIUM PHOSPHATE 10 MG/ML IJ SOLN
INTRAMUSCULAR | Status: DC | PRN
  Administered 2012-09-04: 10 mg via INTRAVENOUS

## 2012-09-04 MED ORDER — CEFUROXIME SODIUM 1.5 G IJ SOLR
INTRAMUSCULAR | Status: AC
  Filled 2012-09-04: qty 1.5

## 2012-09-04 MED ORDER — OXYCODONE HCL 5 MG PO TABS
ORAL_TABLET | ORAL | Status: AC
  Administered 2012-09-04: 5 mg via ORAL
  Filled 2012-09-04: qty 1

## 2012-09-04 MED ORDER — HYDROMORPHONE HCL PF 1 MG/ML IJ SOLN
INTRAMUSCULAR | Status: AC
  Filled 2012-09-04: qty 2

## 2012-09-04 MED ORDER — MIDAZOLAM HCL 5 MG/5ML IJ SOLN
INTRAMUSCULAR | Status: DC | PRN
  Administered 2012-09-04: 2 mg via INTRAVENOUS

## 2012-09-04 MED ORDER — FENTANYL CITRATE 0.05 MG/ML IJ SOLN: 50.0000 ug | INTRAMUSCULAR | Status: DC | PRN

## 2012-09-04 MED ORDER — ACETAMINOPHEN 650 MG RE SUPP: 650.0000 mg | Freq: Four times a day (QID) | RECTAL | Status: DC | PRN

## 2012-09-04 MED ORDER — CEFUROXIME SODIUM 1.5 G IJ SOLR
INTRAMUSCULAR | Status: DC | PRN
  Administered 2012-09-04: 1.5 g

## 2012-09-04 MED ORDER — LACTATED RINGERS IV SOLN
INTRAVENOUS | Status: DC
  Administered 2012-09-04: 08:00:00 via INTRAVENOUS

## 2012-09-04 MED ORDER — POTASSIUM CHLORIDE IN NACL 20-0.9 MEQ/L-% IV SOLN
INTRAVENOUS | Status: DC
  Administered 2012-09-04 – 2012-09-05 (×2): via INTRAVENOUS
  Filled 2012-09-04 (×3): qty 1000

## 2012-09-04 MED ORDER — BISACODYL 5 MG PO TBEC
10.0000 mg | DELAYED_RELEASE_TABLET | Freq: Every day | ORAL | Status: DC
  Administered 2012-09-04: 10 mg via ORAL
  Filled 2012-09-04: qty 2

## 2012-09-04 MED ORDER — DIAZEPAM 5 MG PO TABS
5.0000 mg | ORAL_TABLET | Freq: Four times a day (QID) | ORAL | Status: DC | PRN
  Administered 2012-09-04 – 2012-09-05 (×3): 5 mg via ORAL
  Filled 2012-09-04 (×3): qty 1

## 2012-09-04 MED ORDER — FENTANYL CITRATE 0.05 MG/ML IJ SOLN
50.0000 ug | INTRAMUSCULAR | Status: DC | PRN
  Administered 2012-09-04: 100 ug via INTRAVENOUS

## 2012-09-04 MED ORDER — CHLORHEXIDINE GLUCONATE 4 % EX LIQD: 60.0000 mL | Freq: Once | CUTANEOUS | Status: DC

## 2012-09-04 MED ORDER — PHENOL 1.4 % MT LIQD: 1.0000 | OROMUCOSAL | Status: DC | PRN

## 2012-09-04 MED ORDER — HYDROMORPHONE HCL PF 1 MG/ML IJ SOLN
INTRAMUSCULAR | Status: AC
  Administered 2012-09-04: 0.5 mg via INTRAVENOUS
  Filled 2012-09-04: qty 1

## 2012-09-04 MED ORDER — MIDAZOLAM HCL 2 MG/2ML IJ SOLN
INTRAMUSCULAR | Status: AC
  Administered 2012-09-04: 2 mg via INTRAVENOUS
  Filled 2012-09-04: qty 2

## 2012-09-04 MED ORDER — METOCLOPRAMIDE HCL 5 MG/ML IJ SOLN: 5.0000 mg | Freq: Three times a day (TID) | INTRAMUSCULAR | Status: DC | PRN

## 2012-09-04 MED ORDER — FENTANYL CITRATE 0.05 MG/ML IJ SOLN
INTRAMUSCULAR | Status: AC
  Administered 2012-09-04: 100 ug via INTRAVENOUS
  Filled 2012-09-04: qty 2

## 2012-09-04 MED ORDER — ALUM & MAG HYDROXIDE-SIMETH 200-200-20 MG/5ML PO SUSP: 30.0000 mL | ORAL | Status: DC | PRN

## 2012-09-04 MED ORDER — OXYCODONE HCL 5 MG PO TABS
15.0000 mg | ORAL_TABLET | ORAL | Status: DC | PRN
  Administered 2012-09-04 (×2): 20 mg via ORAL
  Administered 2012-09-05 (×4): 30 mg via ORAL
  Filled 2012-09-04: qty 4
  Filled 2012-09-04 (×4): qty 6
  Filled 2012-09-04: qty 4

## 2012-09-04 MED ORDER — DIPHENHYDRAMINE HCL 12.5 MG/5ML PO ELIX: 12.5000 mg | ORAL_SOLUTION | ORAL | Status: DC | PRN

## 2012-09-04 MED ORDER — LACTATED RINGERS IV SOLN
INTRAVENOUS | Status: DC | PRN
  Administered 2012-09-04 (×2): via INTRAVENOUS

## 2012-09-04 MED ORDER — SODIUM CHLORIDE 0.9 % IR SOLN
Status: DC | PRN
  Administered 2012-09-04: 3000 mL

## 2012-09-04 MED ORDER — POVIDONE-IODINE 7.5 % EX SOLN
Freq: Once | CUTANEOUS | Status: DC
  Filled 2012-09-04: qty 118

## 2012-09-04 MED ORDER — ARTIFICIAL TEARS OP OINT
TOPICAL_OINTMENT | OPHTHALMIC | Status: DC | PRN
  Administered 2012-09-04: 1 via OPHTHALMIC

## 2012-09-04 MED ORDER — HYDROMORPHONE HCL PF 1 MG/ML IJ SOLN
0.5000 mg | INTRAMUSCULAR | Status: DC | PRN
  Administered 2012-09-04 (×2): 0.5 mg via INTRAVENOUS

## 2012-09-04 MED ORDER — OXYCODONE HCL 5 MG/5ML PO SOLN: 5.0000 mg | Freq: Once | ORAL | Status: AC | PRN

## 2012-09-04 MED ORDER — ONDANSETRON HCL 4 MG/2ML IJ SOLN
INTRAMUSCULAR | Status: DC | PRN
  Administered 2012-09-04: 4 mg via INTRAVENOUS

## 2012-09-04 MED ORDER — PROPOFOL 10 MG/ML IV BOLUS
INTRAVENOUS | Status: DC | PRN
  Administered 2012-09-04: 200 mg via INTRAVENOUS

## 2012-09-04 MED ORDER — FENTANYL CITRATE 0.05 MG/ML IJ SOLN
INTRAMUSCULAR | Status: DC | PRN
  Administered 2012-09-04 (×2): 50 ug via INTRAVENOUS
  Administered 2012-09-04 (×2): 100 ug via INTRAVENOUS
  Administered 2012-09-04 (×4): 50 ug via INTRAVENOUS

## 2012-09-04 MED ORDER — HYDROMORPHONE HCL PF 1 MG/ML IJ SOLN
0.5000 mg | INTRAMUSCULAR | Status: DC | PRN
  Administered 2012-09-04 – 2012-09-05 (×3): 1 mg via INTRAVENOUS
  Filled 2012-09-04 (×3): qty 1

## 2012-09-04 MED ORDER — CEFAZOLIN SODIUM-DEXTROSE 2-3 GM-% IV SOLR
INTRAVENOUS | Status: AC
  Filled 2012-09-04: qty 50

## 2012-09-04 MED ORDER — ZOLPIDEM TARTRATE 5 MG PO TABS: 5.0000 mg | ORAL_TABLET | Freq: Every evening | ORAL | Status: DC | PRN

## 2012-09-04 MED ORDER — ONDANSETRON HCL 4 MG PO TABS: 4.0000 mg | ORAL_TABLET | Freq: Four times a day (QID) | ORAL | Status: DC | PRN

## 2012-09-04 MED ORDER — OXYCODONE HCL 5 MG PO TABS
5.0000 mg | ORAL_TABLET | Freq: Once | ORAL | Status: AC | PRN
  Administered 2012-09-04: 5 mg via ORAL

## 2012-09-04 MED ORDER — METOCLOPRAMIDE HCL 10 MG PO TABS: 5.0000 mg | ORAL_TABLET | Freq: Three times a day (TID) | ORAL | Status: DC | PRN

## 2012-09-04 MED ORDER — MIDAZOLAM HCL 2 MG/2ML IJ SOLN
1.0000 mg | INTRAMUSCULAR | Status: DC | PRN
  Administered 2012-09-04: 2 mg via INTRAVENOUS

## 2012-09-04 MED ORDER — CEFAZOLIN SODIUM-DEXTROSE 2-3 GM-% IV SOLR
2.0000 g | Freq: Four times a day (QID) | INTRAVENOUS | Status: AC
  Administered 2012-09-04 (×2): 2 g via INTRAVENOUS
  Filled 2012-09-04 (×2): qty 50

## 2012-09-04 MED ORDER — CELECOXIB 200 MG PO CAPS
200.0000 mg | ORAL_CAPSULE | Freq: Two times a day (BID) | ORAL | Status: DC
  Administered 2012-09-04 – 2012-09-05 (×2): 200 mg via ORAL
  Filled 2012-09-04 (×3): qty 1

## 2012-09-04 MED ORDER — ENOXAPARIN SODIUM 30 MG/0.3ML ~~LOC~~ SOLN
30.0000 mg | Freq: Two times a day (BID) | SUBCUTANEOUS | Status: DC
  Administered 2012-09-05: 30 mg via SUBCUTANEOUS
  Filled 2012-09-04 (×3): qty 0.3

## 2012-09-04 MED ORDER — DOCUSATE SODIUM 100 MG PO CAPS
100.0000 mg | ORAL_CAPSULE | Freq: Two times a day (BID) | ORAL | Status: DC
  Administered 2012-09-04 – 2012-09-05 (×2): 100 mg via ORAL
  Filled 2012-09-04 (×3): qty 1

## 2012-09-04 MED ORDER — HYDROMORPHONE HCL PF 1 MG/ML IJ SOLN
0.2500 mg | INTRAMUSCULAR | Status: DC | PRN
  Administered 2012-09-04 (×4): 0.5 mg via INTRAVENOUS

## 2012-09-04 MED ORDER — DEXAMETHASONE SODIUM PHOSPHATE 10 MG/ML IJ SOLN
10.0000 mg | Freq: Three times a day (TID) | INTRAMUSCULAR | Status: DC
  Filled 2012-09-04 (×3): qty 1

## 2012-09-04 MED ORDER — CITALOPRAM HYDROBROMIDE 20 MG PO TABS
20.0000 mg | ORAL_TABLET | Freq: Every day | ORAL | Status: DC
Start: 2012-09-04 — End: 2012-09-05
  Administered 2012-09-04 – 2012-09-05 (×2): 20 mg via ORAL
  Filled 2012-09-04 (×2): qty 1

## 2012-09-04 MED ORDER — ACETAMINOPHEN 10 MG/ML IV SOLN
1000.0000 mg | Freq: Four times a day (QID) | INTRAVENOUS | Status: DC
  Administered 2012-09-04 – 2012-09-05 (×3): 1000 mg via INTRAVENOUS
  Filled 2012-09-04 (×4): qty 100

## 2012-09-04 SURGICAL SUPPLY — 67 items
BANDAGE ESMARK 6X9 LF (GAUZE/BANDAGES/DRESSINGS) ×1 IMPLANT
BLADE SAGITTAL 25.0X1.19X90 (BLADE) ×2 IMPLANT
BLADE SAW SGTL 11.0X1.19X90.0M (BLADE) IMPLANT
BLADE SAW SGTL 13.0X1.19X90.0M (BLADE) ×2 IMPLANT
BLADE SURG 10 STRL SS (BLADE) ×4 IMPLANT
BNDG CMPR 9X6 STRL LF SNTH (GAUZE/BANDAGES/DRESSINGS) ×1
BNDG CMPR MED 15X6 ELC VLCR LF (GAUZE/BANDAGES/DRESSINGS) ×1
BNDG ELASTIC 6X15 VLCR STRL LF (GAUZE/BANDAGES/DRESSINGS) ×2 IMPLANT
BNDG ESMARK 6X9 LF (GAUZE/BANDAGES/DRESSINGS) ×2
BOWL SMART MIX CTS (DISPOSABLE) ×2 IMPLANT
CEMENT HV SMART SET (Cement) ×4 IMPLANT
CLOTH BEACON ORANGE TIMEOUT ST (SAFETY) ×2 IMPLANT
COVER BACK TABLE 24X17X13 BIG (DRAPES) IMPLANT
COVER SURGICAL LIGHT HANDLE (MISCELLANEOUS) ×2 IMPLANT
CUFF TOURNIQUET SINGLE 34IN LL (TOURNIQUET CUFF) ×2 IMPLANT
CUFF TOURNIQUET SINGLE 44IN (TOURNIQUET CUFF) IMPLANT
DRAPE EXTREMITY T 121X128X90 (DRAPE) ×2 IMPLANT
DRAPE INCISE IOBAN 66X45 STRL (DRAPES) ×1 IMPLANT
DRAPE PROXIMA HALF (DRAPES) ×2 IMPLANT
DRAPE U-SHAPE 47X51 STRL (DRAPES) ×2 IMPLANT
DRSG ADAPTIC 3X8 NADH LF (GAUZE/BANDAGES/DRESSINGS) ×2 IMPLANT
DRSG PAD ABDOMINAL 8X10 ST (GAUZE/BANDAGES/DRESSINGS) ×4 IMPLANT
DURAPREP 26ML APPLICATOR (WOUND CARE) ×2 IMPLANT
ELECT CAUTERY BLADE 6.4 (BLADE) ×2 IMPLANT
ELECT REM PT RETURN 9FT ADLT (ELECTROSURGICAL) ×2
ELECTRODE REM PT RTRN 9FT ADLT (ELECTROSURGICAL) ×1 IMPLANT
EVACUATOR 1/8 PVC DRAIN (DRAIN) ×1 IMPLANT
FACESHIELD LNG OPTICON STERILE (SAFETY) ×2 IMPLANT
GLOVE BIO SURGEON STRL SZ7 (GLOVE) ×2 IMPLANT
GLOVE BIOGEL PI IND STRL 7.0 (GLOVE) ×1 IMPLANT
GLOVE BIOGEL PI IND STRL 7.5 (GLOVE) ×1 IMPLANT
GLOVE BIOGEL PI INDICATOR 7.0 (GLOVE) ×1
GLOVE BIOGEL PI INDICATOR 7.5 (GLOVE) ×1
GLOVE SS BIOGEL STRL SZ 7.5 (GLOVE) ×1 IMPLANT
GLOVE SUPERSENSE BIOGEL SZ 7.5 (GLOVE) ×1
GOWN PREVENTION PLUS XLARGE (GOWN DISPOSABLE) ×4 IMPLANT
GOWN STRL NON-REIN LRG LVL3 (GOWN DISPOSABLE) ×4 IMPLANT
HANDPIECE INTERPULSE COAX TIP (DISPOSABLE) ×2
HOOD PEEL AWAY FACE SHEILD DIS (HOOD) ×4 IMPLANT
IMMOBILIZER KNEE 22 UNIV (SOFTGOODS) ×1 IMPLANT
INSERT CUSHION PRONEVIEW LG (MISCELLANEOUS) ×2 IMPLANT
KIT BASIN OR (CUSTOM PROCEDURE TRAY) ×2 IMPLANT
KIT ROOM TURNOVER OR (KITS) ×2 IMPLANT
MANIFOLD NEPTUNE II (INSTRUMENTS) ×2 IMPLANT
NS IRRIG 1000ML POUR BTL (IV SOLUTION) ×2 IMPLANT
PACK TOTAL JOINT (CUSTOM PROCEDURE TRAY) ×2 IMPLANT
PAD ARMBOARD 7.5X6 YLW CONV (MISCELLANEOUS) ×4 IMPLANT
PAD CAST 4YDX4 CTTN HI CHSV (CAST SUPPLIES) ×1 IMPLANT
PADDING CAST COTTON 4X4 STRL (CAST SUPPLIES) ×2
PADDING CAST COTTON 6X4 STRL (CAST SUPPLIES) ×2 IMPLANT
POSITIONER HEAD PRONE TRACH (MISCELLANEOUS) ×2 IMPLANT
RUBBERBAND STERILE (MISCELLANEOUS) ×2 IMPLANT
SET HNDPC FAN SPRY TIP SCT (DISPOSABLE) ×1 IMPLANT
SPONGE GAUZE 4X4 12PLY (GAUZE/BANDAGES/DRESSINGS) ×2 IMPLANT
STRIP CLOSURE SKIN 1/2X4 (GAUZE/BANDAGES/DRESSINGS) ×2 IMPLANT
SUCTION FRAZIER TIP 10 FR DISP (SUCTIONS) ×2 IMPLANT
SUT ETHIBOND NAB CT1 #1 30IN (SUTURE) ×4 IMPLANT
SUT MNCRL AB 3-0 PS2 18 (SUTURE) ×2 IMPLANT
SUT VIC AB 0 CT1 27 (SUTURE) ×4
SUT VIC AB 0 CT1 27XBRD ANBCTR (SUTURE) ×2 IMPLANT
SUT VIC AB 2-0 CT1 27 (SUTURE) ×4
SUT VIC AB 2-0 CT1 TAPERPNT 27 (SUTURE) ×2 IMPLANT
SYR 30ML SLIP (SYRINGE) ×2 IMPLANT
TOWEL OR 17X24 6PK STRL BLUE (TOWEL DISPOSABLE) ×2 IMPLANT
TOWEL OR 17X26 10 PK STRL BLUE (TOWEL DISPOSABLE) ×2 IMPLANT
TRAY FOLEY CATH 14FR (SET/KITS/TRAYS/PACK) ×2 IMPLANT
WATER STERILE IRR 1000ML POUR (IV SOLUTION) ×6 IMPLANT

## 2012-09-04 NOTE — Transfer of Care (Signed)
Immediate Anesthesia Transfer of Care Note  Patient: Jim [name]  Procedure(s) Performed: Procedure(s) (LRB) with comments: TOTAL KNEE ARTHROPLASTY (Left)  Patient Location: PACU  Anesthesia Type:General and Regional  Level of Consciousness: awake, alert , oriented and sedated  Airway & Oxygen Therapy: Patient Spontanous Breathing, Patient connected to nasal cannula oxygen and Patient connected to face mask oxygen  Post-op Assessment: Report given to PACU RN, Post -op Vital signs reviewed and stable and Patient moving all extremities  Post vital signs: Reviewed and stable  Complications: No apparent anesthesia complications

## 2012-09-04 NOTE — Progress Notes (Signed)
Orthopedic Tech Progress Note Patient Details:  Jim [name] 1956-04-08 [id number]  CPM Left Knee CPM Left Knee: On Left Knee Flexion (Degrees): 60  Left Knee Extension (Degrees): 0  Additional Comments: trapeze bar   Cammer, Mickie Bail 09/04/2012, 1:00 PM

## 2012-09-04 NOTE — Evaluation (Signed)
Physical Therapy Evaluation Patient Details Name: Jim [name] MRN: [id number] DOB: November 18, 1955 Today's Date: 09/04/2012 Time: 7829-5621 PT Time Calculation (min): 26 min  PT Assessment / Plan / Recommendation Clinical Impression  Patient is a 57 yo male s/p Lt. TKA.  Will benefit from acute PT to maximize independence prior to discharge.  Recommend HHPT for continued therapy.    PT Assessment  Patient needs continued PT services    Follow Up Recommendations  Home health PT;Supervision/Assistance - 24 hour    Does the patient have the potential to tolerate intense rehabilitation      Barriers to Discharge None      Equipment Recommendations  None recommended by PT    Recommendations for Other Services     Frequency 7X/week    Precautions / Restrictions Precautions Precautions: Knee Precaution Booklet Issued: Yes (comment) Precaution Comments: Reviewed precautions with patient. Required Braces or Orthoses: Knee Immobilizer - Left Knee Immobilizer - Left: On except when in CPM;Discontinue once straight leg raise with < 10 degree lag Restrictions Weight Bearing Restrictions: Yes LLE Weight Bearing: Weight bearing as tolerated   Pertinent Vitals/Pain       Mobility  Bed Mobility Bed Mobility: Supine to Sit;Sitting - Scoot to Edge of Bed Supine to Sit: 4: Min assist;HOB flat Sitting - Scoot to Edge of Bed: 4: Min guard;With rail Details for Bed Mobility Assistance: Verbal cues for technique.  Assist to move LLE off of bed..  Patient sat EOB x 6 minutes with good balance. Transfers Transfers: Sit to Stand;Stand to Sit;Stand Pivot Transfers Sit to Stand: 4: Min assist;With upper extremity assist;From bed;From elevated surface Stand to Sit: 4: Min assist;With upper extremity assist;With armrests;To chair/3-in-1 Stand Pivot Transfers: 4: Min assist Details for Transfer Assistance: Verbal cues for hand placement, placement of LLE during transfers, and technique.  Assist for  stability and to rise from sitting.  Able to take several steps to pivot to chair. Ambulation/Gait Ambulation/Gait Assistance: Not tested (comment)    Exercises Total Joint Exercises Ankle Circles/Pumps: AROM;Both;10 reps;Seated   PT Diagnosis: Difficulty walking;Acute pain  PT Problem List: Decreased strength;Decreased range of motion;Decreased mobility;Decreased knowledge of use of DME;Decreased knowledge of precautions;Pain PT Treatment Interventions: DME instruction;Gait training;Stair training;Functional mobility training;Therapeutic exercise;Patient/family education   PT Goals Acute Rehab PT Goals PT Goal Formulation: With patient Time For Goal Achievement: 09/11/12 Potential to Achieve Goals: Good Pt will go Supine/Side to Sit: Independently;with HOB 0 degrees PT Goal: Supine/Side to Sit - Progress: Goal set today Pt will go Sit to Supine/Side: Independently;with HOB 0 degrees PT Goal: Sit to Supine/Side - Progress: Goal set today Pt will go Sit to Stand: with supervision;with upper extremity assist PT Goal: Sit to Stand - Progress: Goal set today Pt will Ambulate: 51 - 150 feet;with supervision;with rolling walker PT Goal: Ambulate - Progress: Goal set today Pt will Go Up / Down Stairs: 3-5 stairs;with supervision;with rail(s);with least restrictive assistive device PT Goal: Up/Down Stairs - Progress: Goal set today Pt will Perform Home Exercise Program: Independently PT Goal: Perform Home Exercise Program - Progress: Goal set today  Visit Information  Last PT Received On: 09/04/12 Assistance Needed: +1    Subjective Data  Subjective: "Let's do this" Patient Stated Goal: To go home soon   Prior Functioning  Home Living Lives With: Alone (Will be staying with ex-wife and her mother.) Available Help at Discharge: Family;Available 24 hours/day Type of Home: House Home Access: Stairs to enter [employer] of Steps: 4 Entrance  Stairs-Rails: Right;Left Home  Layout: One level Bathroom Shower/Tub: Engineer, manufacturing systems: Standard Bathroom Accessibility: Yes How Accessible: Accessible via walker Home Adaptive Equipment: Walker - rolling;Bedside commode/3-in-1;Straight cane Prior Function Level of Independence: Independent Able to Take Stairs?: Yes Driving: Yes Vocation: On disability Communication Communication: No difficulties    Cognition  Cognition Overall Cognitive Status: Appears within functional limits for tasks assessed/performed Arousal/Alertness: Awake/alert Orientation Level: Oriented X4 / Intact Behavior During Session: [facility] for tasks performed    Extremity/Trunk Assessment Right Upper Extremity Assessment RUE ROM/Strength/Tone: WFL for tasks assessed RUE Sensation: WFL - Light Touch Left Upper Extremity Assessment LUE ROM/Strength/Tone: WFL for tasks assessed LUE Sensation: WFL - Light Touch Right Lower Extremity Assessment RLE ROM/Strength/Tone: WFL for tasks assessed (Patient reports he needs a TKA on RLE as well) RLE Sensation: WFL - Light Touch Left Lower Extremity Assessment LLE ROM/Strength/Tone: Deficits;Unable to fully assess;Due to pain LLE ROM/Strength/Tone Deficits: Able to assist moving LLE off of bed. Trunk Assessment Trunk Assessment: Normal   Balance    End of Session PT - End of Session Equipment Utilized During Treatment: Gait belt;Left knee immobilizer;Oxygen Activity Tolerance: Patient limited by pain Patient left: in chair;with call bell/phone within reach Nurse Communication: Mobility status CPM Left Knee CPM Left Knee: Off   GP     Vena Austria 09/04/2012, 6:17 PM Durenda Hurt. Renaldo Fiddler, [facility] Acute Rehab Services Pager [phone]

## 2012-09-04 NOTE — Anesthesia Preprocedure Evaluation (Signed)
Anesthesia Evaluation  Patient identified by MRN, date of birth, ID band Patient awake    Reviewed: Allergy & Precautions, H&P , NPO status , Patient's Chart, lab work & pertinent test results  Airway Mallampati: II  Neck ROM: full    Dental   Pulmonary former smoker,          Cardiovascular     Neuro/Psych Depression    GI/Hepatic   Endo/Other  obese  Renal/GU      Musculoskeletal  (+) Arthritis -,   Abdominal   Peds  Hematology   Anesthesia Other Findings   Reproductive/Obstetrics                           Anesthesia Physical Anesthesia Plan  ASA: II  Anesthesia Plan: General and Regional   Post-op Pain Management: MAC Combined w/ Regional for Post-op pain   Induction: Intravenous  Airway Management Planned: LMA  Additional Equipment:   Intra-op Plan:   Post-operative Plan:   Informed Consent: I have reviewed the patients History and Physical, chart, labs and discussed the procedure including the risks, benefits and alternatives for the proposed anesthesia with the patient or authorized representative who has indicated his/her understanding and acceptance.     Plan Discussed with: CRNA and Surgeon  Anesthesia Plan Comments:         Anesthesia Quick Evaluation

## 2012-09-04 NOTE — Progress Notes (Signed)
UR COMPLETED  

## 2012-09-04 NOTE — Anesthesia Postprocedure Evaluation (Signed)
Anesthesia Post Note  Patient: Jim [name]  Procedure(s) Performed: Procedure(s) (LRB): TOTAL KNEE ARTHROPLASTY (Left)  Anesthesia type: general  Patient location: PACU  Post pain: Pain level controlled  Post assessment: Patient's Cardiovascular Status Stable  Last Vitals:  Filed Vitals:   09/04/12 1345  BP:   Pulse: 88  Temp:   Resp: 9    Post vital signs: Reviewed and stable  Level of consciousness: sedated  Complications: No apparent anesthesia complications

## 2012-09-04 NOTE — H&P (View-Only) (Signed)
TOTAL KNEE ADMISSION H&P  Patient is being admitted for left total knee arthroplasty.  Subjective:  Chief Complaint:left knee pain.  HPI: Jim [name], 57 y.o. male, has a history of pain and functional disability in the left knee due to arthritis and has failed non-surgical conservative treatments for greater than 12 weeks to includeNSAID's and/or analgesics, corticosteriod injections, viscosupplementation injections, flexibility and strengthening excercises, use of assistive devices, weight reduction as appropriate and activity modification.  Onset of symptoms was gradual, starting 5 years ago with gradually worsening course since that time. The patient noted no past surgery on the left knee(s).  Patient currently rates pain in the left knee(s) at 10 out of 10 with activity. Patient has night pain, worsening of pain with activity and weight bearing, pain that interferes with activities of daily living, crepitus and joint swelling.  Patient has evidence of subchondral sclerosis, periarticular osteophytes and joint space narrowing by imaging studies. There is no active infection.  There are no active problems to display for this patient.  Past Medical History  Diagnosis Date  . Depression   . Gastric ulcer   . Arthritis   . Hyperlipidemia   . Bursitis of hip     Bilateral  . 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 Ex-smoker     Quit 11/07/2011    Past Surgical History  Procedure Date  . Knee arthroscopy     right knee  . Carpal tunnel release     right  . Tonsillectomy   . 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 Colonoscopy 01/31/2012    Procedure: COLONOSCOPY;  Surgeon: Robert M Rourk, MD;  Location: [facility];  Service: Endoscopy;  Laterality: N/A;  7:30 AM  . Orif finger fracture     Right 5th finger, Amputation reatachment     (Not in a hospital admission) No Known Allergies   Current Outpatient Prescriptions on File Prior to  Visit  Medication Sig Dispense Refill  . citalopram (CELEXA) 20 MG tablet Take 20 mg by mouth daily.      . oxyCODONE-acetaminophen (PERCOCET) 10-325 MG per tablet Take 1 tablet by mouth every 4 (four) hours as needed. Pain      . Multiple Vitamin (MULTIVITAMIN WITH MINERALS) TABS Take 1 tablet by mouth daily.         History  Substance Use Topics  . Smoking status: Former Smoker -- 2.0 packs/day for 40 years    Types: Cigarettes    Quit date: 10/31/2011  . Smokeless tobacco: Not on file  . Alcohol Use: Yes     Comment: 1 beer every 3-4 months    Family History  Problem Relation Age of Onset  . Heart disease Mother   . Heart disease Father   . Diabetes type I Father   . Heart disease Sister   . Heart disease Brother   . Hypertension Father   . Cancer Father      Review of Systems  Constitutional: Negative.  Negative for fever, chills and weight loss.  HENT: Negative.   Eyes: Negative.   Respiratory: Negative.   Cardiovascular: Negative.   Gastrointestinal: Negative.   Musculoskeletal: Positive for back pain and joint pain.       Left knee   Skin: Negative.   Neurological: Negative.   Endo/Heme/Allergies: Negative.   Psychiatric/Behavioral: Negative.     Objective:  Physical Exam  Vitals reviewed. Constitutional:   He is oriented to person, place, and time. He appears well-developed and well-nourished.  HENT:  Head: Normocephalic and atraumatic.  Eyes: Pupils are equal, round, and reactive to light.  Neck: Normal range of motion. Neck supple.  Cardiovascular: Normal rate, regular rhythm and normal heart sounds.   Respiratory: Effort normal and breath sounds normal. He has no wheezes.  GI: Soft. Bowel sounds are normal. There is no tenderness.  Genitourinary:       Not pertinent to current symptomatology therefore not examined.   Musculoskeletal: He exhibits tenderness.       Examination of both knees reveals moderate varus deformity bilaterally 1+ crepitation  1+ synovitis range of motion -5 to 115 degrees with diffuse pain, both knees are stable with normal patella tracking. Vascular exam: pulses 2+ and symmetric.  Neurological: He is alert and oriented to person, place, and time.  Skin: Skin is warm and dry.  Psychiatric: He has a normal mood and affect.    Vital signs in last 24 hours: Last recorded: 01/22 1500   BP: 147/94 Pulse: 94  Temp: 98.5 F (36.9 C)    Height: 5' 9" (1.753 m) SpO2: 97  Weight: 102.967 kg (227 lb)     Labs:   Estimated Body mass index is 33.52 kg/(m^2) as calculated from the following:   Height as of this encounter: 5' 9"(1.753 m).   Weight as of this encounter: 227 lb(102.967 kg).   Imaging Review Plain radiographs demonstrate severe degenerative joint disease of the left knee(s). The overall alignment issignificant varus. The bone quality appears to be good for age and reported activity level. Both knees standing AP lateral flexion and sunrise views show significant varus end stage degenerative joint disease with bone on bone arthritis and periarticular osteophytes and spurring and a large anterior loose body in the left knee. Assessment/Plan:  End stage arthritis, left knee  Patient Active Problem List  Diagnosis  . Depression  . Gastric ulcer  . Arthritis  . Hyperlipidemia  . Bursitis of hip  . DJD (degenerative joint disease) of knee  . Ex-smoker  . Back pain   The patient history, physical examination, clinical judgment of the provider and imaging studies are consistent with end stage degenerative joint disease of the left knee(s) and total knee arthroplasty is deemed medically necessary. The treatment options including medical management, injection therapy arthroscopy and arthroplasty were discussed at length. The risks and benefits of total knee arthroplasty were presented and reviewed. The risks due to aseptic loosening, infection, stiffness, patella tracking problems, thromboembolic  complications and other imponderables were discussed. The patient acknowledged the explanation, agreed to proceed with the plan and consent was signed. Patient is being admitted for inpatient treatment for surgery, pain control, PT, OT, prophylactic antibiotics, VTE prophylaxis, progressive ambulation and ADL's and discharge planning. The patient is planning to be discharged home with home health services  Dawsyn Zurn A. Noland Pizano, PA-C Physician Assistant [facility] [phone]  08/23/2012, 4:27 PM    

## 2012-09-04 NOTE — Op Note (Signed)
MRN:     [id number] DOB/AGE:    1955/11/02 / 57 y.o.       OPERATIVE REPORT    DATE OF PROCEDURE:  09/04/2012       PREOPERATIVE DIAGNOSIS:   DJD LEFT KNEE      Estimated Body mass index is 31.01 kg/(m^2) as calculated from the following:   Height as of 01/31/12: 5\' 9" (1.753 m).   Weight as of 01/31/12: 210 lb(95.255 kg).                                                        POSTOPERATIVE DIAGNOSIS:   degenerative joint disease left knee                                                                      PROCEDURE:  Procedure(s): TOTAL KNEE ARTHROPLASTY Using Depuy Sigma RP implants #3 Femur, #4Tibia, 12.52mm sigma RP bearing, 35 Patella     SURGEON: Pasqualina Colasurdo A    ASSISTANT:  Kirstin Shepperson PA-C   (Present and scrubbed throughout the case, critical for assistance with exposure, retraction, instrumentation, and closure.)         ANESTHESIA: GET with Femoral Nerve Block  DRAINS: foley, 2 medium hemovac in knee   TOURNIQUET TIME:   COMPLICATIONS:  None     SPECIMENS: None   INDICATIONS FOR PROCEDURE: The patient has  DJD LEFT KNEE, varus deformities, XR shows bone on bone arthritis. Patient has failed all conservative measures including anti-inflammatory medicines, narcotics, attempts at  exercise and weight loss, cortisone injections and viscosupplementation.  Risks and benefits of surgery have been discussed, questions answered.   DESCRIPTION OF PROCEDURE: The patient identified by armband, received  right femoral nerve block and IV antibiotics, in the holding area at [facility]. Patient taken to the operating room, appropriate anesthetic  monitors were attached General endotracheal anesthesia induced with  the patient in supine position, Foley catheter was inserted. Tourniquet  applied high to the operative thigh. Lateral post and foot positioner  applied to the table, the lower extremity was then prepped and draped  in usual sterile fashion from the ankle to  the tourniquet. Time-out procedure was performed. The limb was wrapped with an Esmarch bandage and the tourniquet inflated to 365 mmHg. We began the operation by making the anterior midline incision starting at handbreadth above the patella going over the patella 1 cm medial to and  4 cm distal to the tibial tubercle. Small bleeders in the skin and the  subcutaneous tissue identified and cauterized. Transverse retinaculum was incised and reflected medially and a medial parapatellar arthrotomy was accomplished. the patella was everted and theprepatellar fat pad resected. The superficial medial collateral  ligament was then elevated from anterior to posterior along the proximal  flare of the tibia and anterior half of the menisci resected. The knee was hyperflexed exposing bone on bone arthritis. Peripheral and notch osteophytes as well as the cruciate ligaments were then resected. We continued to  work our way around posteriorly along the proximal tibia, and externally  rotated  the tibia subluxing it out from underneath the femur. A McHale  retractor was placed through the notch and a lateral Hohmann retractor  placed, and we then drilled through the proximal tibia in line with the  axis of the tibia followed by an intramedullary guide rod and 2-degree  posterior slope cutting guide. The tibial cutting guide was pinned into place  allowing resection of 4 mm of bone medially and about 6 mm of bone  laterally because of her varus deformity. Satisfied with the tibial resection, we then  entered the distal femur 2 mm anterior to the PCL origin with the  intramedullary guide rod and applied the distal femoral cutting guide  set at 11mm, with 5 degrees of valgus. This was pinned along the  epicondylar axis. At this point, the distal femoral cut was accomplished without difficulty. We then sized for a #3 femoral component and pinned the guide in 3 degrees of external rotation.The chamfer cutting guide was  pinned into place. The anterior, posterior, and chamfer cuts were accomplished without difficulty followed by  the Sigma RP box cutting guide and the box cut. We also removed posterior osteophytes from the posterior femoral condyles. At this  time, the knee was brought into full extension. We checked our  extension and flexion gaps and found them symmetric at 12.85mm.  The patella thickness measured at 25 mm. We set the cutting guide at 15 and removed the posterior 9.5-10 mm  of the patella sized for 35 button and drilled the lollipop. The knee  was then once again hyperflexed exposing the proximal tibia. We sized for a #4 tibial base plate, applied the smokestack and the conical reamer followed by the the Delta fin keel punch. We then hammered into place the Sigma RP trial femoral component, inserted a 12.5-mm trial bearing, trial patellar button, and took the knee through range of motion from 0-130 degrees. No thumb pressure was required for patellar  tracking. At this point, all trial components were removed, a double batch of DePuy HV cement with 1500 mg of Zinacef was mixed and applied to all bony metallic mating surfaces except for the posterior condyles of the femur itself. In order, we  hammered into place the tibial tray and removed excess cement, the femoral component and removed excess cement, a 12.5-mm Sigma RP bearing  was inserted, and the knee brought to full extension with compression.  The patellar button was clamped into place, and excess cement  removed. While the cement cured the wound was irrigated out with normal saline solution pulse lavage, and medium Hemovac drains were placed.. Ligament stability and patellar tracking were checked and found to be excellent. The tourniquet was then released and hemostasis was obtained with cautery. The parapatellar arthrotomy was closed with  #1 ethibond suture. The subcutaneous tissue with 0 and 2-0 undyed  Vicryl suture, and 4-0 Monocryl.. A  dressing of Xeroform,  4 x 4, dressing sponges, Webril, and Ace wrap applied. Needle and sponge count were correct times 2.The patient awakened, extubated, and taken to recovery room without difficulty. Vascular status was normal, pulses 2+ and symmetric.   Aman Bonet A 09/04/2012, 11:36 AM

## 2012-09-04 NOTE — Preoperative (Signed)
Beta Blockers   Reason not to administer Beta Blockers:Not Applicable 

## 2012-09-04 NOTE — Interval H&P Note (Signed)
History and Physical Interval Note:  09/04/2012 9:05 AM  Jim [name]  has presented today for surgery, with the diagnosis of DJD LEFT KNEE  The various methods of treatment have been discussed with the patient and family. After consideration of risks, benefits and other options for treatment, the patient has consented to  Procedure(s) (LRB) with comments: TOTAL KNEE ARTHROPLASTY (Left) as a surgical intervention .  The patient's history has been reviewed, patient examined, no change in status, stable for surgery.  I have reviewed the patient's chart and labs.  Questions were answered to the patient's satisfaction.     Salvatore Marvel A

## 2012-09-05 ENCOUNTER — Encounter (HOSPITAL_COMMUNITY): Payer: Self-pay | Admitting: Orthopedic Surgery

## 2012-09-05 LAB — BASIC METABOLIC PANEL
CO2: 25 mEq/L (ref 19–32)
Calcium: 8.9 mg/dL (ref 8.4–10.5)
Glucose, Bld: 192 mg/dL — ABNORMAL HIGH (ref 70–99)
Potassium: 4.2 mEq/L (ref 3.5–5.1)
Sodium: 139 mEq/L (ref 135–145)

## 2012-09-05 LAB — CBC
MCHC: 32.6 g/dL (ref 30.0–36.0)
Platelets: 183 10*3/uL (ref 150–400)
RDW: 13 % (ref 11.5–15.5)
WBC: 14.2 10*3/uL — ABNORMAL HIGH (ref 4.0–10.5)

## 2012-09-05 MED ORDER — ACETAMINOPHEN 325 MG PO TABS: 650.0000 mg | ORAL_TABLET | Freq: Four times a day (QID) | ORAL | Status: DC | PRN

## 2012-09-05 MED ORDER — DSS 100 MG PO CAPS: ORAL_CAPSULE | ORAL | Status: DC

## 2012-09-05 MED ORDER — ENOXAPARIN SODIUM 30 MG/0.3ML ~~LOC~~ SOLN: 30.0000 mg | Freq: Two times a day (BID) | SUBCUTANEOUS | Status: DC

## 2012-09-05 MED ORDER — OXYCODONE HCL 15 MG PO TABS: ORAL_TABLET | ORAL | Status: DC

## 2012-09-05 MED ORDER — DIAZEPAM 5 MG PO TABS: 5.0000 mg | ORAL_TABLET | Freq: Three times a day (TID) | ORAL | Status: DC | PRN

## 2012-09-05 MED ORDER — BISACODYL 5 MG PO TBEC: DELAYED_RELEASE_TABLET | ORAL | Status: DC

## 2012-09-05 MED ORDER — CELECOXIB 200 MG PO CAPS: 200.0000 mg | ORAL_CAPSULE | Freq: Every day | ORAL | Status: DC

## 2012-09-05 NOTE — Care Management Note (Signed)
    Page 1 of 1   09/05/2012     11:47:25 AM   CARE MANAGEMENT NOTE 09/05/2012  Patient:  Jim [name], Jim [name]   Account Number:  [id number]  Date Initiated:  09/04/2012  Documentation initiated by:  Anette Guarneri  Subjective/Objective Assessment:   left TKA  plan d/c home w/HH services  MD office pre-arranged w/[facility]     Action/Plan:   Home w/HH services,  DME delivered to home by [facility]   Anticipated DC Date:  09/05/2012   Anticipated DC Plan:  HOME W HOME HEALTH SERVICES         Choice offered to / List presented to:             Status of service:  Completed, signed off Medicare Important Message given?   (If response is "NO", the following Medicare IM given date fields will be blank) Date Medicare IM given:   Date Additional Medicare IM given:    Discharge Disposition:  HOME W HOME HEALTH SERVICES  Per UR Regulation:  Reviewed for med. necessity/level of care/duration of stay  If discussed at Long Length of Stay Meetings, dates discussed:    Comments:  09/05/12 11:44 Anette Guarneri RN/CM Spoke with patient regarding d/c plans. Patient plans to d/c home, MD office has pre-arranged services with [facility] Has RW/3n1/CPM per [facility].

## 2012-09-05 NOTE — Evaluation (Signed)
Occupational Therapy Evaluation Patient Details Name: Jim [name] MRN: [id number] DOB: 08/05/1955 Today's Date: 09/05/2012 Time: 4098-1191 OT Time Calculation (min): 16 min  OT Assessment / Plan / Recommendation Clinical Impression  pt doing well following L knee surgery and  at a min A level for LB ADLs and sup level with ADL mobility. All education completed, no further acute or follow up OT needed at this time. OT will sign off    OT Assessment  Patient does not need any further OT services    Follow Up Recommendations  Home health OT    Barriers to Discharge      Equipment Recommendations  Tub/shower seat    Recommendations for Other Services    Frequency       Precautions / Restrictions Precautions Precautions: Knee Required Braces or Orthoses: Knee Immobilizer - Left Restrictions Weight Bearing Restrictions: Yes LLE Weight Bearing: Weight bearing as tolerated   Pertinent Vitals/Pain     ADL  Eating/Feeding: Performed;Set up Where Assessed - Eating/Feeding: Chair Grooming: Performed;Wash/dry hands;Wash/dry face;Supervision/safety Where Assessed - Grooming: Supported standing Upper Body Bathing: Simulated;Supervision/safety;Set up Where Assessed - Upper Body Bathing: Supported standing Lower Body Bathing: Simulated;Minimal assistance Where Assessed - Lower Body Bathing: Unsupported sitting;Supported standing;Supported sit to stand Upper Body Dressing: Performed;Supervision/safety;Set up Where Assessed - Upper Body Dressing: Unsupported sitting Lower Body Dressing: Performed;Minimal assistance Where Assessed - Lower Body Dressing: Unsupported sitting;Supported standing;Supported sit to stand Toilet Transfer: Performed;Supervision/safety Toilet Transfer Method: Other (comment) (ambulating from RW level) Toilet Transfer Equipment: Raised toilet seat with arms (or 3-in-1 over toilet) Toileting - Clothing Manipulation and Hygiene: Performed;Min guard Where Assessed  - Glass blower/designer Manipulation and Hygiene: Standing Equipment Used: Rolling walker;Sock aid;Knee Immobilizer;Long-handled shoe horn;Reacher;Long-handled sponge;Other (comment) (3 in 1) ADL Comments: pt provided with education and demos of ADL A/E for use at home    OT Diagnosis:    OT Problem List:   OT Treatment Interventions:     OT Goals    Visit Information  Last OT Received On: 09/05/12    Subjective Data  Subjective: " I am gonna have plenty help at home " Patient Stated Goal: To return home   Prior Functioning     Home Living Lives With: Alone;Other (Comment) (will d/c to ex wife's house, ex mother in law) Available Help at Discharge: Family;Available 24 hours/day Type of Home: House Home Access: Stairs to enter [employer] of Steps: 4 Entrance Stairs-Rails: Right;Left Home Layout: One level Bathroom Shower/Tub: Health visitor: Standard Bathroom Accessibility: Yes How Accessible: Accessible via walker Home Adaptive Equipment: Walker - rolling;Bedside commode/3-in-1;Straight cane Prior Function Level of Independence: Independent Able to Take Stairs?: Yes Driving: Yes Vocation: On disability Communication Communication: No difficulties Dominant Hand: Right         Vision/Perception Vision - History Baseline Vision: Wears glasses all the time Patient Visual Report: No change from baseline Vision - Assessment Eye Alignment: Within Functional Limits Perception Perception: Within Functional Limits   Cognition  Cognition Overall Cognitive Status: Appears within functional limits for tasks assessed/performed Arousal/Alertness: Awake/alert Orientation Level: Appears intact for tasks assessed Behavior During Session: [facility] for tasks performed    Extremity/Trunk Assessment Right Upper Extremity Assessment RUE ROM/Strength/Tone: [facility] for tasks assessed Left Upper Extremity Assessment LUE ROM/Strength/Tone: WFL for tasks  assessed     Mobility Bed Mobility Bed Mobility: Not assessed Transfers Transfers: Sit to Stand;Stand to Sit Sit to Stand: 4: Min guard;5: Supervision;With armrests;From chair/3-in-1 Stand to Sit: 4: Min guard;5: Supervision;With  armrests;To chair/3-in-1     Exercise     Balance Balance Balance Assessed: No   End of Session OT - End of Session Equipment Utilized During Treatment: Gait belt;Left knee immobilizer;Other (comment) (3 in 1, ADL A/E) Activity Tolerance: Patient tolerated treatment well Patient left: in chair;with call bell/phone within reach    GO     Galen Manila 09/05/2012, 10:17 AM

## 2012-09-05 NOTE — Progress Notes (Signed)
Physical Therapy Note   09/05/12 0900  PT Visit Information  Last PT Received On 09/05/12  Assistance Needed +1  PT Time Calculation  PT Start Time 0826  PT Stop Time 0851  PT Time Calculation (min) 25 min  Subjective Data  Subjective I'm ready to go home.    Precautions  Precautions Knee  Precaution Comments pt able to perform SLR.    Restrictions  Weight Bearing Restrictions Yes  LLE Weight Bearing WBAT  Cognition  Overall Cognitive Status Appears within functional limits for tasks assessed/performed  Arousal/Alertness Awake/alert  Orientation Level Oriented X4 / Intact  Behavior During Session [facility] for tasks performed  Bed Mobility  Bed Mobility Not assessed  Transfers  Transfers Sit to Stand;Stand to Sit  Sit to Stand 5: Supervision;With upper extremity assist;From chair/3-in-1;With armrests  Stand to Sit 5: Supervision;With upper extremity assist;To chair/3-in-1;With armrests  Details for Transfer Assistance cues for UE use and controlling descent to chair.    Ambulation/Gait  Ambulation/Gait Assistance 5: Supervision  Ambulation Distance (Feet) 120 Feet  Assistive device Rolling walker  Ambulation/Gait Assistance Details cues for upright posture, gait sequencing, increased WBing on L LE.    Gait Pattern Step-through pattern;Decreased step length - right;Decreased stance time - left;Decreased stride length  Stairs Yes  Stairs Assistance 4: Min guard  Stair Management Technique One rail Left;Sideways  Number of Stairs 6   Wheelchair Mobility  Wheelchair Mobility No  Balance  Balance Assessed No  Exercises  Exercises Total Joint  Total Joint Exercises  Ankle Circles/Pumps AROM;Both;10 reps  Quad Sets AROM;Both;10 reps  Long [address] [place];Left;10 reps  Knee Flexion AAROM;Left;10 reps  PT - End of Session  Equipment Utilized During Treatment Gait belt  Activity Tolerance Patient tolerated treatment well  Patient left in chair;with call bell/phone within reach   Nurse Communication Mobility status  PT - Assessment/Plan  Comments on Treatment Session pt presents with L TKA.  pt moving excellent!  Ready for D/C to home from PT stand point.    PT Plan Discharge plan remains appropriate;Frequency remains appropriate  PT Frequency 7X/week  Follow Up Recommendations Home health PT;Supervision - Intermittent  PT equipment None recommended by PT  Acute Rehab PT Goals  Time For Goal Achievement 09/11/12  Potential to Achieve Goals Good  PT Goal: Sit to Stand - Progress Met  PT Goal: Ambulate - Progress Met  PT Goal: Up/Down Stairs - Progress Progressing toward goal  PT Goal: Perform Home Exercise Program - Progress Progressing toward goal  PT General Charges  $$ ACUTE PT VISIT 1 Procedure  PT Treatments  $Gait Training 8-22 mins  $Therapeutic Exercise 8-22 mins    Mamta Rimmer, PT [phone]

## 2012-09-05 NOTE — Plan of Care (Signed)
Problem: Phase II Progression Outcomes Goal: Discharge plan established Recommend HH OT, shower chair for d/c home after acute care stay

## 2012-09-05 NOTE — Discharge Summary (Signed)
Patient ID: Jim [name] MRN: [id number] DOB/AGE: 01-08-1956 57 y.o.  Admit date: 09/04/2012 Discharge date: 09/05/2012  Admission Diagnoses:  Principal Problem:  *DJD (degenerative joint disease) of knee Active Problems:  Depression  Gastric ulcer  Arthritis  Hyperlipidemia  Bursitis of hip  Ex-smoker  Back pain   Discharge Diagnoses:  Same  Past Medical History  Diagnosis Date  . Depression   . Gastric ulcer   . Arthritis   . Hyperlipidemia   . Bursitis of hip     Bilateral  . 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 Ex-smoker     Quit 11/07/2011  . Back pain   . History of colon polyps     Surgeries: Procedure(s): TOTAL KNEE ARTHROPLASTY on 09/04/2012   Consultants:    Discharged Condition: Improved  Hospital Course: Jim [name] is an 57 y.o. male who was admitted 09/04/2012 for operative treatment ofDJD (degenerative joint disease) of knee. Patient has severe unremitting pain that affects sleep, daily activities, and work/hobbies. After pre-op clearance the patient was taken to the operating room on 09/04/2012 and underwent  Procedure(s): TOTAL KNEE ARTHROPLASTY.    Patient was given perioperative antibiotics: Anti-infectives     Start     Dose/Rate Route Frequency Ordered Stop   09/04/12 1700   ceFAZolin (ANCEF) IVPB 2 g/50 mL premix        2 g 100 mL/hr over 30 Minutes Intravenous Every 6 hours 09/04/12 1530 09/04/12 2240   09/04/12 1050   cefUROXime (ZINACEF) injection  Status:  Discontinued          As needed 09/04/12 1050 09/04/12 1200   09/04/12 0724   ceFAZolin (ANCEF) 2-3 GM-% IVPB SOLR     Comments: WILHELM, JENNIFER: cabinet override         09/04/12 0724 09/04/12 1929   09/04/12 0600   ceFAZolin (ANCEF) IVPB 2 g/50 mL premix        2 g 100 mL/hr over 30 Minutes Intravenous On call to O.R. 09/03/12 1214 09/04/12 1015           Patient was given sequential compression devices, early ambulation, and  chemoprophylaxis to prevent DVT.  Patient benefited maximally from hospital stay and there were no complications.    Recent vital signs: Patient Vitals for the past 24 hrs:  BP Temp Temp src Pulse Resp SpO2 Height Weight  09/05/12 0536 175/77 mmHg 98 F (36.7 C) Oral 75  18  98 % - -  09/05/12 0219 152/71 mmHg 97.4 F (36.3 C) Oral 78  19  97 % - -  09/04/12 2209 - - - - - - 5\' 9"  (1.753 m) 102.967 kg (227 lb)  09/04/12 2100 154/73 mmHg 97.7 F (36.5 C) - 88  19  98 % - -  09/04/12 1600 - - - - 13  97 % - -  09/04/12 1539 168/84 mmHg 98.6 F (37 C) - 105  12  98 % - -  09/04/12 1500 - - - 94  11  97 % - -  09/04/12 1449 165/93 mmHg - - - - - - -  09/04/12 1445 - - - 84  11  98 % - -  09/04/12 1434 150/99 mmHg - - - - - - -  09/04/12 1430 - - - 89  18  98 % - -  09/04/12 1419 167/89 mmHg - - - - - - -  09/04/12 1415 - - -  99  16  97 % - -  2012/09/20 1404 165/93 mmHg - - - - - - -  09-20-2012 1400 - - - 92  16  97 % - -  09/20/12 1349 162/95 mmHg - - - - - - -  09-20-12 1345 - - - 88  9  96 % - -  September 20, 2012 1334 158/84 mmHg - - - - - - -  09-20-12 1330 - - - 91  15  96 % - -  09/20/12 1319 164/82 mmHg - - - - - - -  Sep 20, 2012 1315 - - - 91  18  99 % - -  09-20-2012 1306 165/87 mmHg - - - - - - -  09-20-12 1300 - - - 89  15  99 % - -  Sep 20, 2012 1249 168/128 mmHg - - - - - - -  2012/09/20 1245 - - - 91  13  98 % - -  2012-09-20 1234 169/104 mmHg - - - - - - -  09/20/2012 1230 - - - 86  17  96 % - -  Sep 20, 2012 1219 181/99 mmHg - - - - - - -  09/20/2012 1215 - - - 88  16  98 % - -  2012-09-20 1205 172/104 mmHg 96.8 F (36 C) - 95  19  93 % - -     Recent laboratory studies:  Basename 09/05/12 0550 09-20-2012 1600  WBC 14.2* 12.8*  HGB 11.5* 12.7*  HCT 35.3* 38.1*  PLT 183 166  NA 139 --  K 4.2 --  CL 103 --  CO2 25 --  BUN 11 --  CREATININE 0.77 0.86  GLUCOSE 192* --  INR -- --  CALCIUM 8.9 --     Discharge Medications:     Medication List     As of 09/05/2012  8:23 AM    STOP  taking these medications         oxyCODONE-acetaminophen 10-325 MG per tablet   Commonly known as: PERCOCET      TAKE these medications         acetaminophen 325 MG tablet   Commonly known as: TYLENOL   Take 2 tablets (650 mg total) by mouth every 6 (six) hours as needed for pain (or Fever >/= 101).      bisacodyl 5 MG EC tablet   Commonly known as: DULCOLAX   Take 2 tablets every night with dinner until bowel movement.  LAXITIVE.  Restart if two days since last bowel movement      celecoxib 200 MG capsule   Commonly known as: CELEBREX   Take 1 capsule (200 mg total) by mouth daily.      citalopram 20 MG tablet   Commonly known as: CELEXA   Take 20 mg by mouth daily.      diazepam 5 MG tablet   Commonly known as: VALIUM   Take 1 tablet (5 mg total) by mouth every 8 (eight) hours as needed for anxiety (or muscle spasm).      DSS 100 MG Caps   1 tab 2 times a day while on narcotics.  STOOL SOFTENER      enoxaparin 30 MG/0.3ML injection   Commonly known as: LOVENOX   Inject 0.3 mLs (30 mg total) into the skin every 12 (twelve) hours.      multivitamin with minerals Tabs   Take 1 tablet by mouth daily.      oxyCODONE 15 MG immediate release  tablet   Commonly known as: ROXICODONE   1-2 tablets every 4-6 hrs as needed for pain        Diagnostic Studies: Dg Chest 2 View  08/29/2012  *RADIOLOGY REPORT*  Clinical Data: Preop knee surgery.  Prior smoker.  CHEST - 2 VIEW  Comparison: 04/07/2011  Findings: Heart and mediastinal contours are within normal limits. No focal opacities or effusions.  No acute bony abnormality.  IMPRESSION: No active cardiopulmonary disease.   Original Report Authenticated By: Charlett Nose, M.D.     Disposition: 06-Home-Health Care Svc      Discharge Orders    Future Orders Please Complete By Expires   Diet - low sodium heart healthy      Call MD / Call 911      Comments:   If you experience chest pain or shortness of breath, CALL 911 and be  transported to the hospital emergency room.  If you develope a fever above 101 F, pus (white drainage) or increased drainage or redness at the wound, or calf pain, call your surgeon's office.   Discharge instructions      Comments:   Total Knee Replacement Care After Refer to this sheet in the next few weeks. These discharge instructions provide you with general information on caring for yourself after you leave the hospital. Your caregiver may also give you specific instructions. Your treatment has been planned according to the most current medical practices available, but unavoidable complications sometimes occur. If you have any problems or questions after discharge, please call your caregiver. Regaining a near full range of motion of your knee within the first 3 to 6 weeks after surgery is critical. HOME CARE INSTRUCTIONS  You may resume a normal diet and activities as directed.  Perform exercises as directed.  Place yellow foam block, yellow side up under heel at all times except when in CPM or when walking.  DO NOT modify, tear, cut, or change in any way. You will receive physical therapy daily  Take showers instead of baths until informed otherwise.  Change bandages (dressings)daily Do not take over-the-counter or prescription medicines for pain, discomfort, or fever. Eat a well-balanced diet.  Avoid lifting or driving until you are instructed otherwise.  Make an appointment to see your caregiver for stitches (suture) or staple removal as directed.  If you have been sent home with a continuous passive motion machine (CPM machine), 0-90 degrees 6 hrs a day   2 hrs a shift SEEK MEDICAL CARE IF: You have swelling of your calf or leg.  You develop shortness of breath or chest pain.  You have redness, swelling, or increasing pain in the wound.  There is pus or any unusual drainage coming from the surgical site.  You notice a bad smell coming from the surgical site or dressing.  The surgical  site breaks open after sutures or staples have been removed.  There is persistent bleeding from the suture or staple line.  You are getting worse or are not improving.  You have any other questions or concerns.  SEEK IMMEDIATE MEDICAL CARE IF:  You have a fever.  You develop a rash.  You have difficulty breathing.  You develop any reaction or side effects to medicines given.  Your knee motion is decreasing rather than improving.  MAKE SURE YOU:  Understand these instructions.  Will watch your condition.  Will get help right away if you are not doing well or get worse.   Constipation Prevention  Comments:   Drink plenty of fluids.  Prune juice may be helpful.  You may use a stool softener, such as Colace (over the counter) 100 mg twice a day.  Use MiraLax (over the counter) for constipation as needed.   Increase activity slowly as tolerated      CPM      Comments:   Continuous passive motion machine (CPM):      Use the CPM from 0 to 90 for 6 hours per day.       You may break it up into 2 or 3 sessions per day.      Use CPM for 2 weeks or until you are told to stop.   TED hose      Comments:   Use stockings (TED hose) for 2 weeks on both leg(s).  You may remove them at night for sleeping.   Change dressing      Comments:   Change the dressing daily with sterile 4 x 4 inch gauze dressing and apply TED hose.  You may clean the incision with alcohol prior to redressing.   Do not put a pillow under the knee. Place it under the heel.      Comments:   Place yellow foam block, yellow side up under heel at all times except when in CPM or when walking.  DO NOT modify, tear, cut, or change in any way the yellow foam block.      Follow-up Information    Follow up with Nilda Simmer, MD. On 09/18/2012. (appt time 2:30)    Contact information:   [address] [place] [address] [phone]           Signed: Pascal Lux 09/05/2012, 8:23 AM

## 2012-10-18 ENCOUNTER — Other Ambulatory Visit: Payer: Self-pay | Admitting: Family Medicine

## 2012-10-18 LAB — LIPID PANEL
HDL: 31 mg/dL — ABNORMAL LOW (ref >39)
LDL Cholesterol: 92 mg/dL (ref 0–99)
Total CHOL/HDL Ratio: 4.9 Ratio
Triglycerides: 141 mg/dL (ref <150)
VLDL: 28 mg/dL (ref 0–40)

## 2012-10-18 LAB — HEPATIC FUNCTION PANEL
Albumin: 4 g/dL (ref 3.5–5.2)
Total Protein: 6.4 g/dL (ref 6.0–8.3)

## 2012-10-20 ENCOUNTER — Encounter: Payer: Self-pay | Admitting: *Deleted

## 2012-10-23 ENCOUNTER — Encounter: Payer: Self-pay | Admitting: Family Medicine

## 2012-10-23 ENCOUNTER — Ambulatory Visit (INDEPENDENT_AMBULATORY_CARE_PROVIDER_SITE_OTHER): Payer: Medicaid Other | Admitting: Family Medicine

## 2012-10-23 VITALS — BP 140/88 | HR 80 | Ht 69.0 in | Wt 214.0 lb

## 2012-10-23 DIAGNOSIS — M25569 Pain in unspecified knee: Secondary | ICD-10-CM

## 2012-10-23 DIAGNOSIS — M25561 Pain in right knee: Secondary | ICD-10-CM

## 2012-10-23 DIAGNOSIS — E785 Hyperlipidemia, unspecified: Secondary | ICD-10-CM

## 2012-10-23 MED ORDER — HYDROCODONE-ACETAMINOPHEN 10-325 MG PO TABS: 1.0000 | ORAL_TABLET | ORAL | Status: DC | PRN

## 2012-10-23 NOTE — Progress Notes (Signed)
  Subjective:    Patient ID: Jim [name], male    DOB: 16-Jul-1956, 57 y.o.   MRN: [id number]  Knee Pain  The incident occurred more than 1 week ago. There was no injury mechanism. The pain is present in the left knee and right knee. The quality of the pain is described as aching. The pain is at a severity of 7/10. The pain is moderate. The pain has been intermittent since onset. Associated symptoms include an inability to bear weight (use cane), a loss of motion (decreased rom l knee since total knee in early Feb) and muscle weakness. Pertinent negatives include no loss of sensation, numbness or tingling. He reports no foreign bodies present. The symptoms are aggravated by movement. He has tried nothing for the symptoms. The treatment provided mild relief.    He has a history of arthritis and a history of chronic pain also a history hyperlipidemia. He will need to followup for regular wellness exam on a yearly basis.  Review of Systems  Neurological: Negative for tingling and numbness.       Objective:   Physical Exam  On exam he is using a cane and a walker and he cannot flex his leg totally or extend it fully his lungs are clear his hearts regular surgical wound is healed he has osteoarthritis both sides skin is warm and dry neurologic grossly normal      Assessment & Plan:  Knee pain, bilateral - Plan: HYDROcodone-acetaminophen (NORCO) 10-325 MG per tablet  Other and unspecified hyperlipidemia  This patient does have chronic pain issues he is on narcotics for this we will use hydrocodone 10 mg/325 one every 4 hours as needed #180 tablets. 2 refills. He will followup in 3 months time.

## 2012-10-23 NOTE — Patient Instructions (Signed)
Use meds as directed  Come back in 3 months

## 2012-12-27 ENCOUNTER — Ambulatory Visit (INDEPENDENT_AMBULATORY_CARE_PROVIDER_SITE_OTHER): Payer: Medicaid Other | Admitting: Family Medicine

## 2012-12-27 ENCOUNTER — Encounter: Payer: Self-pay | Admitting: Family Medicine

## 2012-12-27 VITALS — BP 144/86 | Temp 97.9°F | Ht 69.0 in | Wt 235.4 lb

## 2012-12-27 DIAGNOSIS — M1990 Unspecified osteoarthritis, unspecified site: Secondary | ICD-10-CM

## 2012-12-27 DIAGNOSIS — M129 Arthropathy, unspecified: Secondary | ICD-10-CM

## 2012-12-27 MED ORDER — PREDNISONE 20 MG PO TABS: ORAL_TABLET | ORAL | Status: DC

## 2012-12-27 MED ORDER — OXYCODONE-ACETAMINOPHEN 10-325 MG PO TABS: 1.0000 | ORAL_TABLET | ORAL | Status: DC | PRN

## 2012-12-27 NOTE — Progress Notes (Signed)
  Subjective:    Patient ID: Jim [name], male    DOB: October 05, 1955, 57 y.o.   MRN: [id number]  Arm Pain  The incident occurred 3 to 5 days ago. The injury mechanism is unknown. The pain is present in the left forearm and left hand. The quality of the pain is described as aching. The pain radiates to the left arm. The pain is at a severity of 10/10. The pain is severe. The pain has been constant since the incident. Associated symptoms include tingling. Exacerbated by: lying down. He has tried elevation and immobilization for the symptoms. The treatment provided mild relief.   No rash , moderate pain from neck to hand Worse today, hydrocodone not helping   Review of Systems  Neurological: Positive for tingling.  - for injury - for sob,cp    Objective:   Physical Exam  Vitals reviewed. Constitutional: He appears well-developed.  HENT:  Head: Normocephalic.  Cardiovascular: Normal rate, regular rhythm and normal heart sounds.   Pulmonary/Chest: Effort normal and breath sounds normal.  Musculoskeletal: Normal range of motion. He exhibits edema.  subj pain in hand, tender to firm touch, some hand swelling  Lymphadenopathy:    He has no cervical adenopathy.          Assessment & Plan:  Prednisone taper Percocet 10 / 325 # 50 , use in place of hydrocodone Recheck one week Nerve impingement vs tendonitis

## 2013-01-02 ENCOUNTER — Encounter: Payer: Self-pay | Admitting: Family Medicine

## 2013-01-02 ENCOUNTER — Ambulatory Visit (INDEPENDENT_AMBULATORY_CARE_PROVIDER_SITE_OTHER): Payer: Medicaid Other | Admitting: Family Medicine

## 2013-01-02 VITALS — BP 128/82 | HR 60

## 2013-01-02 DIAGNOSIS — M25561 Pain in right knee: Secondary | ICD-10-CM

## 2013-01-02 DIAGNOSIS — M25569 Pain in unspecified knee: Secondary | ICD-10-CM

## 2013-01-02 DIAGNOSIS — M792 Neuralgia and neuritis, unspecified: Secondary | ICD-10-CM

## 2013-01-02 DIAGNOSIS — IMO0002 Reserved for concepts with insufficient information to code with codable children: Secondary | ICD-10-CM

## 2013-01-02 DIAGNOSIS — M25539 Pain in unspecified wrist: Secondary | ICD-10-CM

## 2013-01-02 DIAGNOSIS — M25532 Pain in left wrist: Secondary | ICD-10-CM

## 2013-01-02 LAB — RHEUMATOID FACTOR: Rhuematoid fact SerPl-aCnc: <10 IU/mL (ref <=14)

## 2013-01-02 LAB — SEDIMENTATION RATE: Sed Rate: 6 mm/hr (ref 0–16)

## 2013-01-02 MED ORDER — HYDROCODONE-ACETAMINOPHEN 10-325 MG PO TABS: 1.0000 | ORAL_TABLET | ORAL | Status: DC | PRN

## 2013-01-02 NOTE — Progress Notes (Signed)
  Subjective:    Patient ID: NUMAN ZYLSTRA, male    DOB: 1956/02/15, 57 y.o.   MRN: [id number]  Arm Pain  The incident occurred more than 1 week ago. The incident occurred in the yard. The pain is present in the left forearm and left shoulder. The quality of the pain is described as aching. The pain does not radiate. The pain is at a severity of 4/10. The pain has been improving since the incident. The symptoms are aggravated by movement.   Patient no longer smokes. Family history noncontributory He has a history of arthralgias and joint pain discomfort.   Review of Systems Denies fever sweats chills he does have arthralgia in other joints.Denies chest pain shortness of breath.    Objective:   Physical Exam Lungs are clear heart is regular does have some subjective discomfort in the upper trapezius also some in the forearm into the hand. No obvious redness or swelling in the joints. Has decent range of motion. No rash.       Assessment & Plan:  Arthralgias-we'll check a sedimentation rate await the results of this Probable neuropathic pain down the left arm. I believe this patient should go ahead and see neurology is unsure they will do nerve conduction studies to help rule out the possibility of carpal tunnel or even the possibility of an impingement minute of the nerve in the neck. Patient already uses hydrocodone for pain I told him to stick with that. I do not want to have him on long-term Percocet. He will keep his regular followup appointment.

## 2013-01-03 ENCOUNTER — Encounter: Payer: Self-pay | Admitting: Family Medicine

## 2013-01-10 ENCOUNTER — Ambulatory Visit: Payer: Medicaid Other | Admitting: Family Medicine

## 2013-01-23 ENCOUNTER — Telehealth: Payer: Self-pay | Admitting: Family Medicine

## 2013-01-23 NOTE — Telephone Encounter (Signed)
Patient notified RX ready for pick up at front desk.

## 2013-01-23 NOTE — Telephone Encounter (Signed)
Pt went to Neurologist today and they used the electrodes on it, pt is now in pain and says his Hydrocone 10/325 is not touching the pain. Can we call him in something else for pain? [facility]

## 2013-01-23 NOTE — Telephone Encounter (Signed)
Short term Percocet 10/325, #16 , one q 4 hours in place of hydrocodone, not for long term use.

## 2013-01-25 ENCOUNTER — Telehealth: Payer: Self-pay | Admitting: Family Medicine

## 2013-01-25 NOTE — Telephone Encounter (Signed)
Discussed with patient. Patient verbalized understanding. 

## 2013-01-25 NOTE — Telephone Encounter (Signed)
I have not heard anything yet it often takes 1-2 weeks to get in there about. (If patient has serious concerns regarding urology visit he should probably call back the urologist with these questions. It will not be possible for [place] to assume management of these issues until we receive what was evaluated.)

## 2013-01-25 NOTE — Telephone Encounter (Signed)
Pt saw urologist Tuesday and would like to know if you have heard anything out of this visit.

## 2013-02-22 ENCOUNTER — Encounter: Payer: Self-pay | Admitting: Family Medicine

## 2013-02-22 ENCOUNTER — Ambulatory Visit (INDEPENDENT_AMBULATORY_CARE_PROVIDER_SITE_OTHER): Payer: Medicaid Other | Admitting: Family Medicine

## 2013-02-22 VITALS — BP 128/82 | Wt 225.8 lb

## 2013-02-22 DIAGNOSIS — M25569 Pain in unspecified knee: Secondary | ICD-10-CM

## 2013-02-22 DIAGNOSIS — M25561 Pain in right knee: Secondary | ICD-10-CM

## 2013-02-22 DIAGNOSIS — G5602 Carpal tunnel syndrome, left upper limb: Secondary | ICD-10-CM

## 2013-02-22 DIAGNOSIS — G5600 Carpal tunnel syndrome, unspecified upper limb: Secondary | ICD-10-CM

## 2013-02-22 MED ORDER — HYDROCODONE-ACETAMINOPHEN 10-325 MG PO TABS: 1.0000 | ORAL_TABLET | ORAL | Status: DC | PRN

## 2013-02-22 NOTE — Progress Notes (Signed)
  Subjective:    Patient ID: Jim [name], male    DOB: 1956-06-06, 57 y.o.   MRN: [id number]  HPI Patient arrives to follow up on wrist pain. Patient has had NCV tests. Patient's tests did show nerve conduction problems worse on the left he is oriented surgery on the right side patient denies any other particular troubles. States severe pain discomfort has to take his pain medication for it it still does not totally help her. He denies other troubles. He does need refills on his pain medication he states he takes it responsibly denies any misuse of it denies abuse of it.  PMH chronic arthritic pain. Review of Systems See above.    Objective:   Physical Exam Lungs are clear no crackle heart is regular pulse normal subjective discomfort left hand   Nerve conduction velocity test was reviewed in detail.       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 Refer back to Dr. Dianah Field. Recommend patient followup if ongoing troubles  Chronic pain-pain medication with refills was given. Patient followup in approximately 3 months. He will need lab work I told him to get this in the fall

## 2013-03-13 ENCOUNTER — Other Ambulatory Visit: Payer: Self-pay | Admitting: Family Medicine

## 2013-05-14 ENCOUNTER — Telehealth: Payer: Self-pay | Admitting: Family Medicine

## 2013-05-14 NOTE — Telephone Encounter (Signed)
Patient needs Rx for hydrocodone. °

## 2013-05-14 NOTE — Telephone Encounter (Signed)
Patient transferred to front desk to schedule appointment.  

## 2013-05-16 HISTORY — PX: CARPAL TUNNEL RELEASE: SHX101

## 2013-05-21 ENCOUNTER — Encounter: Payer: Self-pay | Admitting: Family Medicine

## 2013-05-21 ENCOUNTER — Ambulatory Visit (INDEPENDENT_AMBULATORY_CARE_PROVIDER_SITE_OTHER): Payer: Medicaid Other | Admitting: Family Medicine

## 2013-05-21 VITALS — BP 128/84 | Ht 69.0 in | Wt 227.0 lb

## 2013-05-21 DIAGNOSIS — M25561 Pain in right knee: Secondary | ICD-10-CM

## 2013-05-21 DIAGNOSIS — Z125 Encounter for screening for malignant neoplasm of prostate: Secondary | ICD-10-CM

## 2013-05-21 DIAGNOSIS — M25569 Pain in unspecified knee: Secondary | ICD-10-CM

## 2013-05-21 DIAGNOSIS — M549 Dorsalgia, unspecified: Secondary | ICD-10-CM

## 2013-05-21 DIAGNOSIS — L0291 Cutaneous abscess, unspecified: Secondary | ICD-10-CM

## 2013-05-21 MED ORDER — SULFAMETHOXAZOLE-TMP DS 800-160 MG PO TABS: 1.0000 | ORAL_TABLET | Freq: Two times a day (BID) | ORAL | Status: DC

## 2013-05-21 MED ORDER — HYDROCODONE-ACETAMINOPHEN 10-325 MG PO TABS: 1.0000 | ORAL_TABLET | ORAL | Status: DC | PRN

## 2013-05-21 NOTE — Progress Notes (Signed)
  Subjective:    Patient ID: Jim [name], male    DOB: 05-11-1956, 57 y.o.   MRN: [id number]  HPI Patient is here today for a refill on his Norco.  He suffers from chronic back pain, knee pain, elbow pain, etc. This patient was seen today for chronic pain  The medication list was reviewed and updated.  Discussion was held with the patient regarding compliance with pain medication. The patient was advised the importance of maintaining medication and not using illegal substances with these. The patient was educated that we can provide 3 monthly scripts for their medication, it is their responsibility to follow the instructions. Discussion was held with the patient to make sure they're not having significant side effects. Patient is aware that pain medications are meant to minimize the severity of the pain to allow their pain levels to improve to allow for better function. They are aware of that pain medications cannot totally remove their pain. We also talked at length about the importance of his overall health prostate exam was done today. Dietary measures discussed. Importance of staying away from smoking. No other concerns.  Patient states he tried to do a part time job too much pain in his back. This patient at the end of the visit brought up that he would like to try oxycodone plain because it does not have acetaminophen in it. I told him that his current medications stays under the healthy limit of acetaminophen just don't take any additional with it and I don't recommend any additional changes in his pain medicine currently  Review of Systems Denies chest pain shortness breath rectal bleeding hematuria.    Objective:   Physical Exam Lungs clear hearts regular abdomen soft subjective discomfort in his hips back knees elbows  Prostate exam normal. Patient also has several areas on his abdomen and one on his leg that are consistent with MRSA culture was taken    Assessment & Plan:   Severe orthopedic pain-continue pain medication as is do not change pain medicine currently 3 separate scripts given followup in approximately 3 months. Patient's blood pressure prostate exam doing well dietary measures were discussed. Treat infection with Bactrim DS twice a day for 10 days warning signs discussed await culture

## 2013-05-24 LAB — WOUND CULTURE

## 2013-06-20 ENCOUNTER — Telehealth: Payer: Self-pay | Admitting: Family Medicine

## 2013-06-20 MED ORDER — SULFAMETHOXAZOLE-TMP DS 800-160 MG PO TABS: 1.0000 | ORAL_TABLET | Freq: Two times a day (BID) | ORAL | Status: DC

## 2013-06-20 NOTE — Telephone Encounter (Signed)
NTC, review Sx, Bactrim DS one bid 10 days, f/u if ongoing

## 2013-06-20 NOTE — Telephone Encounter (Signed)
Finished meds given for a staph infection, and it seems to have returned.  Can we call in more meds or NTBS?  Please advise.  Call pt

## 2013-06-20 NOTE — Telephone Encounter (Signed)
Seen on 05/21/13 and given bactrim for cellulitis/abscess.

## 2013-06-20 NOTE — Telephone Encounter (Signed)
Finished meds given for staph infection.  Pt feels like it's returning.  Can we call in more meds or NTBS?  Please advise  Pt uses [facility]

## 2013-06-20 NOTE — Telephone Encounter (Signed)
Medication sent in to pharmacy. Patient was notified.  

## 2013-08-14 ENCOUNTER — Ambulatory Visit: Payer: Medicaid Other | Admitting: Family Medicine

## 2013-08-15 ENCOUNTER — Encounter: Payer: Self-pay | Admitting: Family Medicine

## 2013-08-15 ENCOUNTER — Ambulatory Visit (INDEPENDENT_AMBULATORY_CARE_PROVIDER_SITE_OTHER): Payer: Medicaid Other | Admitting: Family Medicine

## 2013-08-15 VITALS — BP 158/96 | Ht 69.0 in | Wt 236.0 lb

## 2013-08-15 DIAGNOSIS — E785 Hyperlipidemia, unspecified: Secondary | ICD-10-CM

## 2013-08-15 DIAGNOSIS — Z79899 Other long term (current) drug therapy: Secondary | ICD-10-CM

## 2013-08-15 DIAGNOSIS — M25569 Pain in unspecified knee: Secondary | ICD-10-CM

## 2013-08-15 DIAGNOSIS — M25562 Pain in left knee: Secondary | ICD-10-CM

## 2013-08-15 DIAGNOSIS — R0602 Shortness of breath: Secondary | ICD-10-CM

## 2013-08-15 DIAGNOSIS — M25561 Pain in right knee: Secondary | ICD-10-CM

## 2013-08-15 DIAGNOSIS — R739 Hyperglycemia, unspecified: Secondary | ICD-10-CM

## 2013-08-15 DIAGNOSIS — I10 Essential (primary) hypertension: Secondary | ICD-10-CM

## 2013-08-15 DIAGNOSIS — Z125 Encounter for screening for malignant neoplasm of prostate: Secondary | ICD-10-CM

## 2013-08-15 DIAGNOSIS — R7309 Other abnormal glucose: Secondary | ICD-10-CM

## 2013-08-15 DIAGNOSIS — M549 Dorsalgia, unspecified: Secondary | ICD-10-CM

## 2013-08-15 MED ORDER — HYDROCODONE-ACETAMINOPHEN 10-325 MG PO TABS: 1.0000 | ORAL_TABLET | ORAL | Status: DC | PRN

## 2013-08-15 MED ORDER — LISINOPRIL 5 MG PO TABS: 5.0000 mg | ORAL_TABLET | Freq: Every day | ORAL | Status: DC

## 2013-08-15 NOTE — Patient Instructions (Signed)
Hypertension  As your heart beats, it forces blood through your arteries. This force is your blood pressure. If the pressure is too high, it is called hypertension (HTN) or high blood pressure. HTN is dangerous because you may have it and not know it. High blood pressure may mean that your heart has to work harder to pump blood. Your arteries may be narrow or stiff. The extra work puts you at risk for heart disease, stroke, and other problems.   Blood pressure consists of two numbers, a higher number over a lower, 110/72, for example. It is stated as "110 over 72." The ideal is below 120 for the top number (systolic) and under 80 for the bottom (diastolic). Write down your blood pressure today.  You should pay close attention to your blood pressure if you have certain conditions such as:   Heart failure.   Prior heart attack.   Diabetes   Chronic kidney disease.   Prior stroke.   Multiple risk factors for heart disease.  To see if you have HTN, your blood pressure should be measured while you are seated with your arm held at the level of the heart. It should be measured at least twice. A one-time elevated blood pressure reading (especially in the Emergency Department) does not mean that you need treatment. There may be conditions in which the blood pressure is different between your right and left arms. It is important to see your caregiver soon for a recheck.  Most people have essential hypertension which means that there is not a specific cause. This type of high blood pressure may be lowered by changing lifestyle factors such as:   Stress.   Smoking.   Lack of exercise.   Excessive weight.   Drug/tobacco/alcohol use.   Eating less salt.  Most people do not have symptoms from high blood pressure until it has caused damage to the body. Effective treatment can often prevent, delay or reduce that damage.  TREATMENT    When a cause has been identified, treatment for high blood pressure is directed at the cause. There are a large number of medications to treat HTN. These fall into several categories, and your caregiver will help you select the medicines that are best for you. Medications may have side effects. You should review side effects with your caregiver.  If your blood pressure stays high after you have made lifestyle changes or started on medicines,    Your medication(s) may need to be changed.   Other problems may need to be addressed.   Be certain you understand your prescriptions, and know how and when to take your medicine.   Be sure to follow up with your caregiver within the time frame advised (usually within two weeks) to have your blood pressure rechecked and to review your medications.   If you are taking more than one medicine to lower your blood pressure, make sure you know how and at what times they should be taken. Taking two medicines at the same time can result in blood pressure that is too low.  SEEK IMMEDIATE MEDICAL CARE IF:   You develop a severe headache, blurred or changing vision, or confusion.   You have unusual weakness or numbness, or a faint feeling.   You have severe chest or abdominal pain, vomiting, or breathing problems.  MAKE SURE YOU:    Understand these instructions.   Will watch your condition.   Will get help right away if you are not doing well   or get worse.  Document Released: 07/19/2005 Document Revised: 10/11/2011 Document Reviewed: 03/08/2008  ExitCare Patient Information 2014 [employer]  DASH Diet   The DASH diet stands for "Dietary Approaches to Stop Hypertension." It is a healthy eating plan that has been shown to reduce high blood pressure (hypertension) in as little as 14 days, while also possibly providing other significant health benefits. These other health benefits include reducing the risk of breast cancer after menopause and reducing the risk of type 2 diabetes, heart disease, colon cancer, and stroke. Health benefits also include weight loss and slowing kidney failure in patients with chronic kidney disease.   DIET GUIDELINES   Limit salt (sodium). Your diet should contain less than 1500 mg of sodium daily.   Limit refined or processed carbohydrates. Your diet should include mostly whole grains. Desserts and added sugars should be used sparingly.   Include small amounts of heart-healthy fats. These types of fats include nuts, oils, and tub margarine. Limit saturated and trans fats. These fats have been shown to be harmful in the body.  CHOOSING FOODS   The following food groups are based on a 2000 calorie diet. See your Registered Dietitian for individual calorie needs.  Grains and Grain Products (6 to 8 servings daily)   Eat More Often: Whole-wheat bread, brown rice, whole-grain or wheat pasta, quinoa, popcorn without added fat or salt (air popped).   Eat Less Often: White bread, white pasta, white rice, cornbread.  Vegetables (4 to 5 servings daily)   Eat More Often: Fresh, frozen, and canned vegetables. Vegetables may be raw, steamed, roasted, or grilled with a minimal amount of fat.   Eat Less Often/Avoid: Creamed or fried vegetables. Vegetables in a cheese sauce.  Fruit (4 to 5 servings daily)   Eat More Often: All fresh, canned (in natural juice), or frozen fruits. Dried fruits without added sugar. One hundred percent fruit juice ( cup [237 mL] daily).   Eat Less Often: Dried fruits with added sugar. Canned fruit in light or heavy syrup.   Lean Meats, Fish, and Poultry (2 servings or less daily. One serving is 3 to 4 oz [85-114 g]).   Eat More Often: Ninety percent or leaner ground beef, tenderloin, sirloin. Round cuts of beef, chicken breast, turkey breast. All fish. Grill, bake, or broil your meat. Nothing should be fried.   Eat Less Often/Avoid: Fatty cuts of meat, turkey, or chicken leg, thigh, or wing. Fried cuts of meat or fish.  Dairy (2 to 3 servings)   Eat More Often: Low-fat or fat-free milk, low-fat plain or light yogurt, reduced-fat or part-skim cheese.   Eat Less Often/Avoid: Milk (whole, 2%).Whole milk yogurt. Full-fat cheeses.  Nuts, Seeds, and Legumes (4 to 5 servings per week)   Eat More Often: All without added salt.   Eat Less Often/Avoid: Salted nuts and seeds, canned beans with added salt.  Fats and Sweets (limited)   Eat More Often: Vegetable oils, tub margarines without trans fats, sugar-free gelatin. Mayonnaise and salad dressings.   Eat Less Often/Avoid: Coconut oils, palm oils, butter, stick margarine, cream, half and half, cookies, candy, pie.  FOR MORE INFORMATION  The Dash Diet Eating Plan: www.dashdiet.org  Document Released: 07/08/2011 Document Revised: 10/11/2011 Document Reviewed: 07/08/2011  Exit[employer] Patient Information 2014 [employer].

## 2013-08-15 NOTE — Progress Notes (Signed)
   Subjective:    Patient ID: Jim [name], male    DOB: 05/24/1956, 58 y.o.   MRN: [id number]  HPI Patient is here today d/t high BPs. He denies any symptoms of HTN.  He went to the dentist last week and it was 196/94. He checked it one other time since then (yesterday), and it was 150's/90's at [facility]. He denies any chest tightness pressure pain shortness of breath  He would also like a refill on his hydrocodone.  This patient was seen today for chronic pain  The medication list was reviewed and updated.  Discussion was held with the patient regarding compliance with pain medication. The patient was advised the importance of maintaining medication and not using illegal substances with these. The patient was educated that we can provide 3 monthly scripts for their medication, it is their responsibility to follow the instructions. Discussion was held with the patient to make sure they're not having significant side effects. Patient is aware that pain medications are meant to minimize the severity of the pain to allow their pain levels to improve to allow for better function. They are aware of that pain medications cannot totally remove their pain.      Review of Systems  Constitutional: Negative for fever, activity change, appetite change and fatigue.  HENT: Negative for congestion and rhinorrhea.   Eyes: Negative for discharge.  Respiratory: Negative for cough, shortness of breath and wheezing.   Cardiovascular: Negative for chest pain.  Gastrointestinal: Negative for vomiting, abdominal pain and blood in stool.  Endocrine: Negative for polydipsia and polyphagia.  Genitourinary: Negative for frequency and difficulty urinating.  Musculoskeletal: Positive for arthralgias and back pain. Negative for neck pain.  Skin: Negative for rash.  Allergic/Immunologic: Negative for environmental allergies and food allergies.  Neurological: Negative for weakness and headaches.    Psychiatric/Behavioral: Negative for confusion and agitation.       Objective:   Physical Exam  Constitutional: He appears well-developed and well-nourished.  HENT:  Head: Normocephalic and atraumatic.  Neck: No thyromegaly present.  Cardiovascular: Normal rate, regular rhythm and normal heart sounds.   No murmur heard. Pulmonary/Chest: Effort normal and breath sounds normal. No respiratory distress. He has no wheezes.  Abdominal: Soft. Bowel sounds are normal. He exhibits no distension and no mass. There is no tenderness.  Musculoskeletal: He exhibits no edema.  Lymphadenopathy:    He has no cervical adenopathy.  Neurological: He is alert. He exhibits normal muscle tone.  Skin: Skin is warm and dry. No erythema.  Psychiatric: He has a normal mood and affect. His behavior is normal. Judgment normal.          Assessment & Plan:  #1 chronic pain-3 prescriptions were given for his regular medicine he is to followup in 3 months time #2 HTN-I discussed with him the importance of trying to stay physically active watching his diet try and lose low but await also minimizing salt in the diet. Also discussed lisinopril 5 mg daily we will go ahead and check lab work as well. He is to followup in several weeks' time to recheck blood pressure. If any problems with medicines followup sooner. His overall evaluation was good.

## 2013-08-16 LAB — LIPID PANEL
CHOLESTEROL: 168 mg/dL (ref 0–200)
HDL: 32 mg/dL — ABNORMAL LOW (ref >39)
LDL CALC: 102 mg/dL — AB (ref 0–99)
TRIGLYCERIDES: 172 mg/dL — AB (ref <150)
Total CHOL/HDL Ratio: 5.3 Ratio
VLDL: 34 mg/dL (ref 0–40)

## 2013-08-16 LAB — HEMOGLOBIN A1C
Hgb A1c MFr Bld: 6.1 % — ABNORMAL HIGH (ref <5.7)
Mean Plasma Glucose: 128 mg/dL — ABNORMAL HIGH (ref <117)

## 2013-08-16 LAB — HEPATIC FUNCTION PANEL
ALT: 33 U/L (ref 0–53)
AST: 24 U/L (ref 0–37)
Albumin: 4 g/dL (ref 3.5–5.2)
Alkaline Phosphatase: 47 U/L (ref 39–117)
Bilirubin, Direct: 0.1 mg/dL (ref 0.0–0.3)
Indirect Bilirubin: 0.4 mg/dL (ref 0.0–0.9)
Total Bilirubin: 0.5 mg/dL (ref 0.3–1.2)
Total Protein: 6.4 g/dL (ref 6.0–8.3)

## 2013-08-16 LAB — BASIC METABOLIC PANEL WITH GFR
BUN: 15 mg/dL (ref 6–23)
CO2: 27 meq/L (ref 19–32)
Calcium: 9.1 mg/dL (ref 8.4–10.5)
Chloride: 106 meq/L (ref 96–112)
Creat: 1.1 mg/dL (ref 0.50–1.35)
Glucose, Bld: 92 mg/dL (ref 70–99)
Potassium: 4.4 meq/L (ref 3.5–5.3)
Sodium: 142 meq/L (ref 135–145)

## 2013-08-16 LAB — PSA: PSA: 0.7 ng/mL

## 2013-08-17 ENCOUNTER — Encounter: Payer: Self-pay | Admitting: Family Medicine

## 2013-08-17 DIAGNOSIS — R7303 Prediabetes: Secondary | ICD-10-CM | POA: Insufficient documentation

## 2013-08-17 LAB — MICROALBUMIN, URINE: Microalb, Ur: 1.04 mg/dL (ref 0.00–1.89)

## 2013-08-20 ENCOUNTER — Ambulatory Visit: Payer: Medicaid Other | Admitting: Family Medicine

## 2013-09-10 ENCOUNTER — Encounter: Payer: Self-pay | Admitting: Family Medicine

## 2013-09-12 ENCOUNTER — Encounter: Payer: Medicaid Other | Admitting: Family Medicine

## 2013-09-13 NOTE — Progress Notes (Signed)
This encounter was created in error - please disregard. This encounter was created in error - please disregard. This encounter was created in error - please disregard. 

## 2013-09-17 ENCOUNTER — Encounter: Payer: Self-pay | Admitting: Family Medicine

## 2013-09-17 ENCOUNTER — Ambulatory Visit (INDEPENDENT_AMBULATORY_CARE_PROVIDER_SITE_OTHER): Payer: Medicaid Other | Admitting: Family Medicine

## 2013-09-17 VITALS — BP 134/80 | Temp 98.1°F | Ht 69.0 in | Wt 235.0 lb

## 2013-09-17 DIAGNOSIS — I10 Essential (primary) hypertension: Secondary | ICD-10-CM

## 2013-09-17 NOTE — Patient Instructions (Signed)
DASH Diet  The DASH diet stands for "Dietary Approaches to Stop Hypertension." It is a healthy eating plan that has been shown to reduce high blood pressure (hypertension) in as little as 14 days, while also possibly providing other significant health benefits. These other health benefits include reducing the risk of breast cancer after menopause and reducing the risk of type 2 diabetes, heart disease, colon cancer, and stroke. Health benefits also include weight loss and slowing kidney failure in patients with chronic kidney disease.   DIET GUIDELINES  · Limit salt (sodium). Your diet should contain less than 1500 mg of sodium daily.  · Limit refined or processed carbohydrates. Your diet should include mostly whole grains. Desserts and added sugars should be used sparingly.  · Include small amounts of heart-healthy fats. These types of fats include nuts, oils, and tub margarine. Limit saturated and trans fats. These fats have been shown to be harmful in the body.  CHOOSING FOODS   The following food groups are based on a 2000 calorie diet. See your Registered Dietitian for individual calorie needs.  Grains and Grain Products (6 to 8 servings daily)  · Eat More Often: Whole-wheat bread, brown rice, whole-grain or wheat pasta, quinoa, popcorn without added fat or salt (air popped).  · Eat Less Often: White bread, white pasta, white rice, cornbread.  Vegetables (4 to 5 servings daily)  · Eat More Often: Fresh, frozen, and canned vegetables. Vegetables may be raw, steamed, roasted, or grilled with a minimal amount of fat.  · Eat Less Often/Avoid: Creamed or fried vegetables. Vegetables in a cheese sauce.  Fruit (4 to 5 servings daily)  · Eat More Often: All fresh, canned (in natural juice), or frozen fruits. Dried fruits without added sugar. One hundred percent fruit juice (½ cup [237 mL] daily).  · Eat Less Often: Dried fruits with added sugar. Canned fruit in light or heavy syrup.  Lean Meats, Fish, and Poultry (2  servings or less daily. One serving is 3 to 4 oz [85-114 g]).  · Eat More Often: Ninety percent or leaner ground beef, tenderloin, sirloin. Round cuts of beef, chicken breast, turkey breast. All fish. Grill, bake, or broil your meat. Nothing should be fried.  · Eat Less Often/Avoid: Fatty cuts of meat, turkey, or chicken leg, thigh, or wing. Fried cuts of meat or fish.  Dairy (2 to 3 servings)  · Eat More Often: Low-fat or fat-free milk, low-fat plain or light yogurt, reduced-fat or part-skim cheese.  · Eat Less Often/Avoid: Milk (whole, 2%). Whole milk yogurt. Full-fat cheeses.  Nuts, Seeds, and Legumes (4 to 5 servings per week)  · Eat More Often: All without added salt.  · Eat Less Often/Avoid: Salted nuts and seeds, canned beans with added salt.  Fats and Sweets (limited)  · Eat More Often: Vegetable oils, tub margarines without trans fats, sugar-free gelatin. Mayonnaise and salad dressings.  · Eat Less Often/Avoid: Coconut oils, palm oils, butter, stick margarine, cream, half and half, cookies, candy, pie.  FOR MORE INFORMATION  The Dash Diet Eating Plan: www.dashdiet.org  Document Released: 07/08/2011 Document Revised: 10/11/2011 Document Reviewed: 07/08/2011  ExitCare® Patient Information ©2014 [employer].

## 2013-09-17 NOTE — Progress Notes (Signed)
   Subjective:    Patient ID: Jim [name], male    DOB: 04/07/1956, 58 y.o.   MRN: [id number]  Hypertension This is a new problem. The current episode started more than 1 month ago. The problem has been gradually improving since onset. The problem is controlled. Pertinent negatives include no chest pain. There are no associated agents to hypertension. There are no known risk factors for coronary artery disease. Treatments tried: lisinopril. The current treatment provides significant improvement. There are no compliance problems.   Patient has no concerns at this time.   Patient denies any other particular troubles he states he is taking his medicine  Review of Systems  Constitutional: Negative for activity change, appetite change and fatigue.  Respiratory: Negative for cough, choking and chest tightness.   Cardiovascular: Negative for chest pain.  Endocrine: Negative for polydipsia and polyphagia.  Neurological: Negative for weakness.  Psychiatric/Behavioral: Negative for confusion.       Objective:   Physical Exam  Vitals reviewed. Constitutional: He appears well-nourished. No distress.  Cardiovascular: Normal rate, regular rhythm and normal heart sounds.   No murmur heard. Pulmonary/Chest: Effort normal and breath sounds normal. No respiratory distress.  Musculoskeletal: He exhibits no edema.  Lymphadenopathy:    He has no cervical adenopathy.  Neurological: He is alert.  Psychiatric: His behavior is normal.          Assessment & Plan:  HTN-overall good control watch diet stay physically active as best as his arthralgias allow him. Followup in approximately 6 months  Patient does state that he might be relocating to [place] in the future if so he will let us know

## 2013-11-09 ENCOUNTER — Encounter: Payer: Self-pay | Admitting: Family Medicine

## 2013-11-09 ENCOUNTER — Ambulatory Visit (INDEPENDENT_AMBULATORY_CARE_PROVIDER_SITE_OTHER): Payer: Medicaid Other | Admitting: Family Medicine

## 2013-11-09 VITALS — BP 130/88 | Ht 69.0 in | Wt 234.0 lb

## 2013-11-09 DIAGNOSIS — J019 Acute sinusitis, unspecified: Secondary | ICD-10-CM

## 2013-11-09 DIAGNOSIS — J309 Allergic rhinitis, unspecified: Secondary | ICD-10-CM

## 2013-11-09 DIAGNOSIS — R0989 Other specified symptoms and signs involving the circulatory and respiratory systems: Secondary | ICD-10-CM

## 2013-11-09 DIAGNOSIS — J45909 Unspecified asthma, uncomplicated: Secondary | ICD-10-CM

## 2013-11-09 MED ORDER — METHYLPREDNISOLONE ACETATE 40 MG/ML IJ SUSP
40.0000 mg | Freq: Once | INTRAMUSCULAR | Status: AC
  Administered 2013-11-09: 40 mg via INTRAMUSCULAR

## 2013-11-09 MED ORDER — CEFPROZIL 500 MG PO TABS: 500.0000 mg | ORAL_TABLET | Freq: Two times a day (BID) | ORAL | Status: DC

## 2013-11-09 MED ORDER — ALBUTEROL SULFATE HFA 108 (90 BASE) MCG/ACT IN AERS: 2.0000 | INHALATION_SPRAY | Freq: Four times a day (QID) | RESPIRATORY_TRACT | Status: DC | PRN

## 2013-11-09 NOTE — Progress Notes (Signed)
   Subjective:    Patient ID: Jim [name], male    DOB: 1955/11/24, 58 y.o.   MRN: [id number]  Sore Throat  This is a new problem. Episode onset: Sunday. Maximum temperature: Low grade yesterday. Associated symptoms include congestion, coughing, headaches and trouble swallowing. Pertinent negatives include no ear pain. Associated symptoms comments: Body aches. Treatments tried: Allergy meds. The treatment provided no relief.   Patient relates cough drainage is and also some wheezing denies severe shortness of breath   Review of Systems  Constitutional: Negative for fever and activity change.  HENT: Positive for congestion, rhinorrhea and trouble swallowing. Negative for ear pain.   Eyes: Negative for discharge.  Respiratory: Positive for cough. Negative for wheezing.   Cardiovascular: Negative for chest pain.  Neurological: Positive for headaches.       Objective:   Physical Exam  Nursing note and vitals reviewed. Constitutional: He appears well-developed.  HENT:  Head: Normocephalic.  Mouth/Throat: Oropharynx is clear and moist. No oropharyngeal exudate.  Neck: Normal range of motion.  Cardiovascular: Normal rate, regular rhythm and normal heart sounds.   No murmur heard. Pulmonary/Chest: Effort normal and breath sounds normal. He has no wheezes.  Lymphadenopathy:    He has no cervical adenopathy.  Neurological: He exhibits normal muscle tone.  Skin: Skin is warm and dry.          Assessment & Plan:  Allergies/sinusitis/bronchial irritation with reactive airway-albuterol when necessary Depo-Medrol shot antibiotics prescribed. Followup if ongoing troubles warning signs discussed  Patient is to keep his appointment for chronic pain management.

## 2013-11-14 ENCOUNTER — Telehealth: Payer: Self-pay | Admitting: Family Medicine

## 2013-11-14 DIAGNOSIS — M25561 Pain in right knee: Secondary | ICD-10-CM

## 2013-11-14 DIAGNOSIS — M25562 Pain in left knee: Principal | ICD-10-CM

## 2013-11-14 MED ORDER — HYDROCODONE-ACETAMINOPHEN 10-325 MG PO TABS: 1.0000 | ORAL_TABLET | ORAL | Status: DC | PRN

## 2013-11-14 NOTE — Telephone Encounter (Signed)
Last seen for pain management 08/2013

## 2013-11-14 NOTE — Telephone Encounter (Signed)
Rx up front for patient pick up. Patient notified. 

## 2013-11-14 NOTE — Telephone Encounter (Signed)
Patient needs Rx for hydrocodone. °

## 2013-11-14 NOTE — Telephone Encounter (Signed)
Refill times one, will need OV in May for further refills

## 2013-11-26 ENCOUNTER — Ambulatory Visit (INDEPENDENT_AMBULATORY_CARE_PROVIDER_SITE_OTHER): Payer: Medicaid Other | Admitting: Family Medicine

## 2013-11-26 ENCOUNTER — Encounter: Payer: Self-pay | Admitting: Family Medicine

## 2013-11-26 VITALS — BP 120/86 | Ht 69.0 in | Wt 227.5 lb

## 2013-11-26 DIAGNOSIS — M25562 Pain in left knee: Secondary | ICD-10-CM

## 2013-11-26 DIAGNOSIS — M25569 Pain in unspecified knee: Secondary | ICD-10-CM

## 2013-11-26 DIAGNOSIS — M7070 Other bursitis of hip, unspecified hip: Secondary | ICD-10-CM

## 2013-11-26 DIAGNOSIS — M549 Dorsalgia, unspecified: Secondary | ICD-10-CM

## 2013-11-26 DIAGNOSIS — M25561 Pain in right knee: Secondary | ICD-10-CM

## 2013-11-26 DIAGNOSIS — M1990 Unspecified osteoarthritis, unspecified site: Secondary | ICD-10-CM

## 2013-11-26 DIAGNOSIS — M129 Arthropathy, unspecified: Secondary | ICD-10-CM

## 2013-11-26 DIAGNOSIS — M76899 Other specified enthesopathies of unspecified lower limb, excluding foot: Secondary | ICD-10-CM

## 2013-11-26 MED ORDER — HYDROCODONE-ACETAMINOPHEN 10-325 MG PO TABS: 1.0000 | ORAL_TABLET | ORAL | Status: DC | PRN

## 2013-11-26 MED ORDER — OXYCODONE-ACETAMINOPHEN 10-325 MG PO TABS: 1.0000 | ORAL_TABLET | ORAL | Status: DC | PRN

## 2013-11-26 NOTE — Progress Notes (Signed)
   Subjective:    Patient ID: Jim [name], male    DOB: 01-09-1956, 58 y.o.   MRN: [id number]  HPIMed check up.   Fell yesterday going up some steps. Hurt left shoulder and neck.   Patient was going up some steps missed a step was startled fell forward striking his hand on the step he jarred his left shoulder and the muscles having into his neck he denies any numbness or tingling he has his chronic pain and discomfort for which he was scheduled for followup. Patient also will be due comprehensive lab work in the summer time for his other health issues  Review of Systems Denies any problems except for what is listed above denies fever chills cough chest pain.    Objective:   Physical Exam Lungs are clear hearts regular subjective discomfort in the low back and his knees and elbows Has stiffness in the left shoulder from a fall injury.       Assessment & Plan:  Left shoulder pain discomfort secondary to fall-I. feel this is strained muscles. Gentle range of motion exercises were shown Percocet 10 mg every 4 hours when necessary severe pain do not use hydrocodone well you are taking this.  Chronic pain-3 separate prescriptions were written for his hydrocodone this should last him well into early August. He needs to followup near that time.  Patient will be do lab work in his next visit from July or August to include lipid panel, liver, metabolic 7, hemoglobin W0J

## 2014-03-13 ENCOUNTER — Telehealth: Payer: Self-pay | Admitting: Family Medicine

## 2014-03-13 MED ORDER — OXYCODONE-ACETAMINOPHEN 10-325 MG PO TABS: 1.0000 | ORAL_TABLET | ORAL | Status: DC | PRN

## 2014-03-13 NOTE — Telephone Encounter (Signed)
Patient said that he will be out of his hydrocodone today and needs a refill. He has an appointment on 03/15/2014.

## 2014-03-13 NOTE — Telephone Encounter (Signed)
Last seen 11/26/13 

## 2014-03-13 NOTE — Telephone Encounter (Signed)
Ok times one--tell pt we did this last time and pt must be seen evdry three mo's to avoid this

## 2014-03-13 NOTE — Telephone Encounter (Signed)
Rx up front for patient pick up. Patient advised to keep appt on 03/15/14

## 2014-03-15 ENCOUNTER — Ambulatory Visit (INDEPENDENT_AMBULATORY_CARE_PROVIDER_SITE_OTHER): Payer: Medicaid Other | Admitting: Family Medicine

## 2014-03-15 ENCOUNTER — Encounter: Payer: Self-pay | Admitting: Family Medicine

## 2014-03-15 VITALS — BP 128/88 | Ht 69.0 in | Wt 227.0 lb

## 2014-03-15 DIAGNOSIS — M25561 Pain in right knee: Secondary | ICD-10-CM

## 2014-03-15 DIAGNOSIS — M25569 Pain in unspecified knee: Secondary | ICD-10-CM

## 2014-03-15 DIAGNOSIS — M25562 Pain in left knee: Principal | ICD-10-CM

## 2014-03-15 MED ORDER — HYDROCODONE-ACETAMINOPHEN 10-325 MG PO TABS: 1.0000 | ORAL_TABLET | ORAL | Status: DC | PRN

## 2014-03-15 NOTE — Progress Notes (Signed)
   Subjective:    Patient ID: Jim [name], male    DOB: October 24, 1955, 58 y.o.   MRN: [id number]  HPI Patient is here today for a 3 month check up.  He needs his hydrocodone refilled.  I see that we refilled oxycodone for him on 03/13/14. He said he does not take that anymore. He takes Hydrocodone. He took a total of 7 pills since 03/13/14.   Pt would prefer the oxycodone over the hydrocodone.     Review of Systems  Constitutional: Negative for activity change, appetite change and fatigue.  HENT: Negative for congestion.   Respiratory: Negative for cough.   Cardiovascular: Negative for chest pain.  Gastrointestinal: Negative for abdominal pain.  Endocrine: Negative for polydipsia and polyphagia.  Neurological: Negative for weakness.  Psychiatric/Behavioral: Negative for confusion.       Objective:   Physical Exam  Vitals reviewed. Constitutional: He appears well-nourished.  Cardiovascular: Normal rate, regular rhythm and normal heart sounds.   No murmur heard. Pulmonary/Chest: Effort normal and breath sounds normal.  Musculoskeletal: He exhibits no edema.  Lymphadenopathy:    He has no cervical adenopathy.  Neurological: He is alert.  Psychiatric: His behavior is normal.          Assessment & Plan:  Chronic arthritis refills on hydrocodone given. Oxycodone canceled. Patient will followup in 3 months. He may get his first refill on August 17.  I do not recommend oxycodone. Concerned about possibility of increased opioid dependence

## 2014-05-04 ENCOUNTER — Other Ambulatory Visit: Payer: Self-pay | Admitting: Family Medicine

## 2014-06-10 ENCOUNTER — Encounter: Payer: Self-pay | Admitting: Family Medicine

## 2014-06-10 ENCOUNTER — Ambulatory Visit (INDEPENDENT_AMBULATORY_CARE_PROVIDER_SITE_OTHER): Payer: Medicaid Other | Admitting: Family Medicine

## 2014-06-10 VITALS — BP 122/80 | Ht 69.0 in | Wt 232.4 lb

## 2014-06-10 DIAGNOSIS — M25561 Pain in right knee: Secondary | ICD-10-CM

## 2014-06-10 DIAGNOSIS — R7303 Prediabetes: Secondary | ICD-10-CM

## 2014-06-10 DIAGNOSIS — M25562 Pain in left knee: Secondary | ICD-10-CM

## 2014-06-10 DIAGNOSIS — R7309 Other abnormal glucose: Secondary | ICD-10-CM

## 2014-06-10 DIAGNOSIS — I10 Essential (primary) hypertension: Secondary | ICD-10-CM

## 2014-06-10 DIAGNOSIS — M1990 Unspecified osteoarthritis, unspecified site: Secondary | ICD-10-CM

## 2014-06-10 DIAGNOSIS — E785 Hyperlipidemia, unspecified: Secondary | ICD-10-CM

## 2014-06-10 MED ORDER — HYDROCODONE-ACETAMINOPHEN 10-325 MG PO TABS: 1.0000 | ORAL_TABLET | ORAL | Status: DC | PRN

## 2014-06-10 MED ORDER — LISINOPRIL 5 MG PO TABS: 5.0000 mg | ORAL_TABLET | Freq: Every day | ORAL | Status: DC

## 2014-06-10 NOTE — Patient Instructions (Signed)

## 2014-06-10 NOTE — Progress Notes (Signed)
   Subjective:    Patient ID: Jim [name], male    DOB: 1956/04/07, 58 y.o.   MRN: [id number]  Hypertension This is a chronic problem. The current episode started more than 1 year ago. The problem has been gradually improving since onset. The problem is controlled. Pertinent negatives include no chest pain. There are no associated agents to hypertension. There are no known risk factors for coronary artery disease. Treatments tried: lisinopril. The current treatment provides significant improvement. There are no compliance problems.    Patient states that he has no other concerns at this time.  This patient was seen today for chronic pain  The medication list was reviewed and updated.   -Compliance with pain medication: good  The patient was advised the importance of maintaining medication and not using illegal substances with these.  Refills needed: yes  The patient was educated that we can provide 3 monthly scripts for their medication, it is their responsibility to follow the instructions.  Side effects or complications from medications: none  Patient is aware that pain medications are meant to minimize the severity of the pain to allow their pain levels to improve to allow for better function. They are aware of that pain medications cannot totally remove their pain.  Due for UDT ( at least once per year) : next visit       Review of Systems  Constitutional: Negative for activity change, appetite change and fatigue.  HENT: Negative for congestion.   Respiratory: Negative for cough.   Cardiovascular: Negative for chest pain.  Gastrointestinal: Negative for abdominal pain.  Endocrine: Negative for polydipsia and polyphagia.  Neurological: Negative for weakness.  Psychiatric/Behavioral: Negative for confusion.       Objective:   Physical Exam  Constitutional: He appears well-nourished. No distress.  Cardiovascular: Normal rate, regular rhythm and normal heart sounds.     No murmur heard. Pulmonary/Chest: Effort normal and breath sounds normal. No respiratory distress.  Musculoskeletal: He exhibits no edema.  Lymphadenopathy:    He has no cervical adenopathy.  Neurological: He is alert.  Psychiatric: His behavior is normal.  Vitals reviewed.         Assessment & Plan:  #1 chronic pain-3 prescriptions were given. He is to follow-up in 3 months. Urine drug screen on next visit as part of standard protocol  #2history hyperlipidemia check lipid profile  #3 HTN continue current measures continue lisinopril check metabolic 7  #4 hemoglobin A1c ordered because of hyperglycemia watch diet closely

## 2014-06-17 ENCOUNTER — Other Ambulatory Visit: Payer: Self-pay | Admitting: Family Medicine

## 2014-06-17 ENCOUNTER — Telehealth: Payer: Self-pay | Admitting: Family Medicine

## 2014-06-17 DIAGNOSIS — M1990 Unspecified osteoarthritis, unspecified site: Secondary | ICD-10-CM

## 2014-06-17 NOTE — Telephone Encounter (Signed)
Discussed with patient. Patient verbalized understanding. 

## 2014-06-17 NOTE — Telephone Encounter (Signed)
Pt states he would like to get the referral for the surgery on his knee  He needs it sent to Dr Noemi Chapel of [facility] (he did his other knee)  Questions please call pt   [phone]

## 2014-06-17 NOTE — Telephone Encounter (Signed)
referrak was put in,plz explain to pt can take 7 to 10 days to hear

## 2014-08-14 LAB — HEMOGLOBIN A1C
Hgb A1c MFr Bld: 5.8 % — ABNORMAL HIGH (ref <5.7)
MEAN PLASMA GLUCOSE: 120 mg/dL — AB (ref <117)

## 2014-08-14 LAB — LIPID PANEL
CHOLESTEROL: 172 mg/dL (ref 0–200)
HDL: 35 mg/dL — ABNORMAL LOW (ref >39)
LDL Cholesterol: 101 mg/dL — ABNORMAL HIGH (ref 0–99)
TRIGLYCERIDES: 178 mg/dL — AB (ref <150)
Total CHOL/HDL Ratio: 4.9 Ratio
VLDL: 36 mg/dL (ref 0–40)

## 2014-08-14 LAB — BASIC METABOLIC PANEL
BUN: 22 mg/dL (ref 6–23)
CHLORIDE: 105 meq/L (ref 96–112)
CO2: 29 mEq/L (ref 19–32)
CREATININE: 0.93 mg/dL (ref 0.50–1.35)
Calcium: 9 mg/dL (ref 8.4–10.5)
Glucose, Bld: 80 mg/dL (ref 70–99)
Potassium: 4.6 mEq/L (ref 3.5–5.3)
Sodium: 139 mEq/L (ref 135–145)

## 2014-09-11 ENCOUNTER — Ambulatory Visit (INDEPENDENT_AMBULATORY_CARE_PROVIDER_SITE_OTHER): Payer: Medicaid Other | Admitting: Family Medicine

## 2014-09-11 ENCOUNTER — Encounter: Payer: Self-pay | Admitting: Family Medicine

## 2014-09-11 VITALS — BP 132/90 | Ht 69.0 in | Wt 229.0 lb

## 2014-09-11 DIAGNOSIS — E785 Hyperlipidemia, unspecified: Secondary | ICD-10-CM

## 2014-09-11 DIAGNOSIS — M25562 Pain in left knee: Secondary | ICD-10-CM

## 2014-09-11 DIAGNOSIS — M25561 Pain in right knee: Secondary | ICD-10-CM

## 2014-09-11 DIAGNOSIS — L0291 Cutaneous abscess, unspecified: Secondary | ICD-10-CM

## 2014-09-11 DIAGNOSIS — M1990 Unspecified osteoarthritis, unspecified site: Secondary | ICD-10-CM

## 2014-09-11 DIAGNOSIS — R7303 Prediabetes: Secondary | ICD-10-CM

## 2014-09-11 DIAGNOSIS — R7309 Other abnormal glucose: Secondary | ICD-10-CM

## 2014-09-11 MED ORDER — HYDROCODONE-ACETAMINOPHEN 10-325 MG PO TABS: 1.0000 | ORAL_TABLET | ORAL | Status: DC | PRN

## 2014-09-11 MED ORDER — SULFAMETHOXAZOLE-TRIMETHOPRIM 800-160 MG PO TABS: 1.0000 | ORAL_TABLET | Freq: Two times a day (BID) | ORAL | Status: DC

## 2014-09-11 NOTE — Patient Instructions (Signed)

## 2014-09-11 NOTE — Progress Notes (Signed)
   Subjective:    Patient ID: Jim [name], male    DOB: 1956/06/11, 59 y.o.   MRN: [id number]  HPI This patient was seen today for chronic pain  The medication list was reviewed and updated.   -Compliance with pain medication: Yes, but sometimes has to take 2 pills every 4 hours for pain.   The patient was advised the importance of maintaining medication and not using illegal substances with these.  Refills needed: Yes, last filled 08/15/14.   The patient was educated that we can provide 3 monthly scripts for their medication, it is their responsibility to follow the instructions.  Side effects or complications from medications: No  Patient is aware that pain medications are meant to minimize the severity of the pain to allow their pain levels to improve to allow for better function. They are aware of that pain medications cannot totally remove their pain.  Due for UDT ( at least once per year) : Next visit        Review of Systems  Constitutional: Negative for activity change, appetite change and fatigue.  HENT: Negative for congestion.   Respiratory: Negative for cough.   Cardiovascular: Negative for chest pain.  Gastrointestinal: Negative for abdominal pain.  Endocrine: Negative for polydipsia and polyphagia.  Neurological: Negative for weakness.  Psychiatric/Behavioral: Negative for confusion.       Objective:   Physical Exam  Constitutional: He appears well-nourished. No distress.  Cardiovascular: Normal rate, regular rhythm and normal heart sounds.   No murmur heard. Pulmonary/Chest: Effort normal and breath sounds normal. No respiratory distress.  Musculoskeletal: He exhibits no edema.  Lymphadenopathy:    He has no cervical adenopathy.  Neurological: He is alert.  Psychiatric: His behavior is normal.  Vitals reviewed.         Assessment & Plan:  Osteoarthritis joints- Dr Noemi Chapel, he uses pain medication on regular basis. His orthopedist does not feel  he needs surgery. Does injection his needs. He denies misusing the medicine but states he takes one every 3-4 hours 6 in a day. His orthopedist prescribed Mobic  Folliculitis in right axilla, patient was taught to he has a abscess under the arm we discussed the importance of treating this. He did consent. 1% lidocaine with epinephrine. #11 blade used to make incision. Significant amount of pus obtained. Antibiotics prescribed. Warm compresses frequently follow-up if ongoing trouble.  Celexa doing well with his moods continue this medication  Borderline hyperlipidemia watch diet closely stay physically active  Prediabetes actually better control continue current approach with low starch diet  Lipids

## 2014-09-14 LAB — WOUND CULTURE
GRAM STAIN: NONE SEEN
GRAM STAIN: NONE SEEN

## 2014-09-19 ENCOUNTER — Telehealth: Payer: Self-pay | Admitting: Family Medicine

## 2014-09-19 NOTE — Telephone Encounter (Signed)
plz refill times 6, (may need to be specific for Ventolin- may want to confirm with pt) Thanks

## 2014-09-19 NOTE — Telephone Encounter (Signed)
OK to refill

## 2014-09-19 NOTE — Telephone Encounter (Signed)
albuterol (PROVENTIL HFA;VENTOLIN HFA) 108 (90 BASE) MCG/ACT inhaler  Pt states that he needs a refill sent in for this med, that you agreed at his last visit to  Do so if his other inhaler wasn't working  [employer]

## 2014-09-20 MED ORDER — ALBUTEROL SULFATE HFA 108 (90 BASE) MCG/ACT IN AERS: 2.0000 | INHALATION_SPRAY | Freq: Four times a day (QID) | RESPIRATORY_TRACT | Status: DC | PRN

## 2014-09-20 NOTE — Telephone Encounter (Signed)
Rx sent electronically to pharmacy. Patient notified. 

## 2014-09-26 ENCOUNTER — Telehealth: Payer: Self-pay | Admitting: Family Medicine

## 2014-09-26 ENCOUNTER — Other Ambulatory Visit: Payer: Self-pay | Admitting: Family Medicine

## 2014-09-26 DIAGNOSIS — M25561 Pain in right knee: Secondary | ICD-10-CM

## 2014-09-26 NOTE — Telephone Encounter (Signed)
The referral was put in. May proceed thank you

## 2014-09-26 NOTE — Telephone Encounter (Signed)
Pt requesting referral to Dr. Noemi Chapel for right knee pain, hurting pretty bad, has seen Dr. Noemi Chapel before, feels like he needs injection, Dr. Archie Endo office told pt they have an opening next Tuesday but due to his [place], they have to have a referral from our office, if OK please initiate in system so that I may process

## 2014-12-10 ENCOUNTER — Ambulatory Visit (INDEPENDENT_AMBULATORY_CARE_PROVIDER_SITE_OTHER): Payer: Medicaid Other | Admitting: Family Medicine

## 2014-12-10 ENCOUNTER — Encounter: Payer: Self-pay | Admitting: Family Medicine

## 2014-12-10 VITALS — BP 124/84 | Ht 69.0 in | Wt 231.0 lb

## 2014-12-10 DIAGNOSIS — M25561 Pain in right knee: Secondary | ICD-10-CM | POA: Diagnosis not present

## 2014-12-10 DIAGNOSIS — G894 Chronic pain syndrome: Secondary | ICD-10-CM | POA: Diagnosis not present

## 2014-12-10 DIAGNOSIS — I10 Essential (primary) hypertension: Secondary | ICD-10-CM

## 2014-12-10 DIAGNOSIS — M25562 Pain in left knee: Secondary | ICD-10-CM

## 2014-12-10 DIAGNOSIS — R739 Hyperglycemia, unspecified: Secondary | ICD-10-CM | POA: Diagnosis not present

## 2014-12-10 DIAGNOSIS — E785 Hyperlipidemia, unspecified: Secondary | ICD-10-CM

## 2014-12-10 DIAGNOSIS — M170 Bilateral primary osteoarthritis of knee: Secondary | ICD-10-CM

## 2014-12-10 DIAGNOSIS — Z79899 Other long term (current) drug therapy: Secondary | ICD-10-CM | POA: Diagnosis not present

## 2014-12-10 DIAGNOSIS — Z125 Encounter for screening for malignant neoplasm of prostate: Secondary | ICD-10-CM

## 2014-12-10 MED ORDER — HYDROCODONE-ACETAMINOPHEN 10-325 MG PO TABS: 1.0000 | ORAL_TABLET | ORAL | Status: DC | PRN

## 2014-12-10 NOTE — Progress Notes (Signed)
   Subjective:    Patient ID: Jim [name], male    DOB: 27-Sep-1955, 59 y.o.   MRN: [id number]  HPI This patient was seen today for chronic pain  The medication list was reviewed and updated.   -Compliance with pain medication: takes as prescribed  The patient was advised the importance of maintaining medication and not using illegal substances with these.  Refills needed: yes  The patient was educated that we can provide 3 monthly scripts for their medication, it is their responsibility to follow the instructions.  Side effects or complications from medications: none  Patient is aware that pain medications are meant to minimize the severity of the pain to allow their pain levels to improve to allow for better function. They are aware of that pain medications cannot totally remove their pain.  Due for UDT ( at least once per year) : urine drug screen done today  Pain the worse in the knees and hands Also with back and hip pain Seeing Dr Noemi Chapel for the knee injection     Review of Systems  Constitutional: Negative for activity change, appetite change and fatigue.  HENT: Negative for congestion.   Respiratory: Negative for cough.   Cardiovascular: Negative for chest pain.  Gastrointestinal: Negative for abdominal pain.  Endocrine: Negative for polydipsia and polyphagia.  Neurological: Negative for weakness.  Psychiatric/Behavioral: Negative for confusion.       Objective:   Physical Exam  Constitutional: He appears well-nourished. No distress.  Cardiovascular: Normal rate, regular rhythm and normal heart sounds.   No murmur heard. Pulmonary/Chest: Effort normal and breath sounds normal. No respiratory distress.  Musculoskeletal: He exhibits no edema.  Lymphadenopathy:    He has no cervical adenopathy.  Neurological: He is alert.  Psychiatric: His behavior is normal.  Vitals reviewed.         Assessment & Plan:  1. Chronic pain syndrome Pain contract  discussed safety measures regarding medication discussed. Prescriptions given.  2. HTN (hypertension), benign Blood pressure under good control continue current measures  3. Hyperlipidemia Check lab work await the results. - Lipid panel  4. Hyperglycemia Watch diet check lab work stay physically active - Hemoglobin A1c  5. Primary osteoarthritis of both knees Patient sees orthopedics on a regular basis. Check sedimentation rate make sure he does not have inflammatory arthritis - Sedimentation rate  6. Knee pain, bilateral Pain medications Arty have been discussed see above. Helps him with his pain he denies abusing it. - HYDROcodone-acetaminophen (NORCO) 10-325 MG per tablet; Take 1 tablet by mouth every 4 (four) hours as needed.  Dispense: 180 tablet; Refill: 0 - Sedimentation rate  7. Special screening for malignant neoplasm of prostate PSA ordered - PSA  8. High risk medication use Screening test. Patient unable to urinate - Drug Screen + Alcohol, Oral Greater than 25 minutes spent with this patient discussing multiple different things follow-up in 3 months

## 2014-12-10 NOTE — Patient Instructions (Signed)

## 2014-12-17 LAB — DRUG SCREEN + ALCOHOL, ORAL
AMPHETAMINE: NEGATIVE
BARBITURATES: NEGATIVE
BENZODIAZEPINES: NEGATIVE
Buprenorphine: NEGATIVE ng/mL
Cocaine and Metabolites: NEGATIVE
ETHANOL, ORAL FLUID: NEGATIVE %
FENTANYL, ORAL FLUID: NEGATIVE pg/mL
METHADONE: NEGATIVE
Marijuana and Metabolites: NEGATIVE
OPIATES SCREEN, ORAL FLUID: NEGATIVE
Oxycod/Oxymor, Oral Fluid: NEGATIVE
Phencyclidine: NEGATIVE
Propoxyphene: NEGATIVE

## 2014-12-17 LAB — TRAMADOL CONF, ORAL FLUID
TRAMADOL: POSITIVE — AB
Tramadol Conf: 293 ng/mL

## 2014-12-24 ENCOUNTER — Ambulatory Visit (INDEPENDENT_AMBULATORY_CARE_PROVIDER_SITE_OTHER): Payer: Medicaid Other | Admitting: Family Medicine

## 2014-12-24 ENCOUNTER — Encounter: Payer: Self-pay | Admitting: Family Medicine

## 2014-12-24 VITALS — Ht 69.0 in | Wt 231.0 lb

## 2014-12-24 DIAGNOSIS — R52 Pain, unspecified: Secondary | ICD-10-CM

## 2014-12-24 DIAGNOSIS — Z5189 Encounter for other specified aftercare: Secondary | ICD-10-CM | POA: Diagnosis not present

## 2014-12-24 NOTE — Progress Notes (Signed)
   Subjective:    Patient ID: Jim [name], male    DOB: 1955/09/30, 59 y.o.   MRN: [id number]  HPI Patient arrives to discuss pain management.   Review of Systems     Objective:   Physical Exam  I discussed with the patient how his oral swab did not show open your eats. Also discussed with the patient how he is taking his medicine he states he takes his medicines as prescribed. He brings in a bottle that only had approximately 11 days of medications that it yet it's only was 11 days ago that he got the medicine filled. So therefore this does not match up. He states it's because he has pills at home he forgot to bring.      Assessment & Plan:  Long discussion held with the patient. His most recent visit he was unable to give a urine and oral swab did not show any open your legs. It is possible though for this to not registry because of it being oral swab. Therefore he was brought into give urine specimen he states he forgot to bring the pills that were in his 2 week planner for pain medications. As a result he is short runs approximately 2 weeks. I told him it is imperative that he follow-up again tomorrow morning and bring all of the tablets with him. He was unable to come back today because of it being late in the afternoon. They will bring the medicines back in the morning we will count needs to make sure that everything lines up appropriately

## 2014-12-25 ENCOUNTER — Telehealth: Payer: Self-pay | Admitting: Family Medicine

## 2014-12-25 NOTE — Telephone Encounter (Signed)
Hydrocodone refill questions, he states he does not have right amount of medication Due to the pain in his right knee. States it is bone to bone an causing a great deal  Of pain.   Pt states he was supposed to call you to explain why he did not have enough meds   Please advise

## 2014-12-25 NOTE — Telephone Encounter (Signed)
Pt came in this morning wanting to speak with Dr. Nicki Reaper regarding his meds. Pt was told that we would have to take a message and have someone call  Him back. Pt in turn stated that he would wait in the waiting room to speak with Scott. We told him that he would not be able to come out and speak to him regarding this. Pt stated he would call back later.

## 2014-12-26 ENCOUNTER — Encounter: Payer: Self-pay | Admitting: Family Medicine

## 2014-12-26 LAB — PSA: Prostate Specific Ag, Serum: 0.9 ng/mL (ref 0.0–4.0)

## 2014-12-26 LAB — SEDIMENTATION RATE: SED RATE: 11 mm/h (ref 0–30)

## 2014-12-26 LAB — LIPID PANEL
CHOL/HDL RATIO: 4.9 ratio (ref 0.0–5.0)
CHOLESTEROL TOTAL: 173 mg/dL (ref 100–199)
HDL: 35 mg/dL — AB (ref >39)
LDL CALC: 113 mg/dL — AB (ref 0–99)
Triglycerides: 125 mg/dL (ref 0–149)
VLDL CHOLESTEROL CAL: 25 mg/dL (ref 5–40)

## 2014-12-26 LAB — HEMOGLOBIN A1C
ESTIMATED AVERAGE GLUCOSE: 128 mg/dL
Hgb A1c MFr Bld: 6.1 % — ABNORMAL HIGH (ref 4.8–5.6)

## 2014-12-26 NOTE — Telephone Encounter (Signed)
Nurses, this patient had a oral swab which did not show any presence of narcotic in his system. It showed tramadol which she is not being prescribed. When we asked the patient, in for a pill count it should be noted that he was on day 10 out of day 30 of his medication yet only had approximately 7 additional days. At first his explanation was he had his other medicine at home and another container. He was instructed to bring this in for an additional pill count but then obtained by in the morning wanting to explain that he been taking more than he was supposed to because of severe arthritis in his knees. Although that may be so that would not account for approximately 70-80 tablets. Because of these inconsistencies and strict protocols that I must follow I will not be able to further prescribe narcotics for this patient. I would recommend that we go forward with putting in for a pain management consultation in [place]. [facility] would be fine. The patient should taper down his pain medication on the remaining prescriptions he has because these must last him until he is seen by the pain management clinic. There is no guarantee the pain management clinic will prescribe him pain medicine. We will not prescribe pain medicine. I am more than happy to have the patient set up an office visit to come in and discuss but this will not change our decision. If the patient once the referral please go ahead with putting this in.

## 2014-12-27 NOTE — Telephone Encounter (Signed)
Notified patient that because of these inconsistencies and strict protocols that Dr. Nicki Reaper must follow he will not be able to further prescribe narcotics for this patient. He would recommend that we go forward with putting in for a pain management consultation in [place]. [facility] would be fine. The patient should taper down his pain medication on the remaining prescriptions he has because these must last him until he is seen by the pain management clinic. There is no guarantee the pain management clinic will prescribe him pain medicine. We will not prescribe pain medicine. He is more than happy to have the patient set up an office visit to come in and discuss but this will not change our decision. Patient verbalized understanding and was transferred to front desk to schedule an appointment per patient request.

## 2014-12-28 LAB — TOXASSURE SELECT 13 (MW), URINE: PDF: 0

## 2015-01-07 ENCOUNTER — Ambulatory Visit (INDEPENDENT_AMBULATORY_CARE_PROVIDER_SITE_OTHER): Payer: Medicaid Other | Admitting: Family Medicine

## 2015-01-07 ENCOUNTER — Encounter: Payer: Self-pay | Admitting: Family Medicine

## 2015-01-07 VITALS — BP 140/88 | Ht 69.0 in | Wt 232.0 lb

## 2015-01-07 DIAGNOSIS — G894 Chronic pain syndrome: Secondary | ICD-10-CM

## 2015-01-07 NOTE — Progress Notes (Signed)
   Subjective:    Patient ID: Jim [name], male    DOB: 1956-05-30, 59 y.o.   MRN: [id number]  HPI Patient is here today to discuss being referred to pain management. Patient states he has no other concerns at this time.   In the past these negative rheumatoid factor I don't feel referral to rheumatology would be fruitful. Patient's pain is such to where it makes it very difficult for him to get up in the morning and move around he also states that makes it very difficult for him to walk around and stay active when he does take pain medicine seems to help he states a hydrocodone used to help a lot but over the years it is lost effectiveness. Review of Systems He admits that on the last drug screen that was completed on him he had ran out of his pain medicine was taking some of his wife's tramadol. He was told that he should stick with the medicine he is been prescribed in the amount prescribed. See discussion below    Objective:   Physical Exam Severe arthritis in the right knee arthritic changes in his hands arthritic discomfort in his lower back.       Assessment & Plan:  20 minutes spent with this patient today discussing his pain management. I have expressed concerns to him that I felt he was using too much of the medication. He states he has severe pain in his lower back in his hands and in his severely arthritic right knee. He hasn't knee replacement surgery hopefully coming up later this year. He states that the pain medicine is to the point where he has gotten used to it and sometimes takes 2 at a time I reminded him that he must stick with the quantity prescribing that we will not prescribe above that. Our practice is not built for long-acting pain medications and close monitoring of this. I believe this patient would be best served by being referred to a pain management center. We will go ahead and initiate the referral.

## 2015-01-09 ENCOUNTER — Other Ambulatory Visit: Payer: Self-pay | Admitting: Family Medicine

## 2015-01-09 DIAGNOSIS — G894 Chronic pain syndrome: Secondary | ICD-10-CM

## 2015-01-14 ENCOUNTER — Other Ambulatory Visit: Payer: Self-pay | Admitting: Family Medicine

## 2015-01-14 ENCOUNTER — Encounter: Payer: Self-pay | Admitting: Internal Medicine

## 2015-01-14 NOTE — Telephone Encounter (Signed)
Ok plus 5 ref 

## 2015-01-30 ENCOUNTER — Encounter: Payer: Self-pay | Admitting: Family Medicine

## 2015-03-12 ENCOUNTER — Ambulatory Visit: Payer: Medicaid Other | Admitting: Family Medicine

## 2015-03-31 ENCOUNTER — Ambulatory Visit (HOSPITAL_COMMUNITY): Payer: Medicaid Other | Attending: Physical Therapy | Admitting: Physical Therapy

## 2015-03-31 ENCOUNTER — Telehealth (HOSPITAL_COMMUNITY): Payer: Self-pay | Admitting: Physical Therapy

## 2015-04-01 NOTE — Telephone Encounter (Signed)
Called re missed appointment.  Jim [name], [place] CLT [phone]

## 2015-04-08 ENCOUNTER — Ambulatory Visit (HOSPITAL_COMMUNITY): Payer: Medicaid Other | Attending: Anesthesiology

## 2015-04-18 ENCOUNTER — Telehealth (HOSPITAL_COMMUNITY): Payer: Self-pay

## 2015-04-18 NOTE — Telephone Encounter (Signed)
When we called pt back to reschedule he stated that he was doing much better and declined services at this time. NF 04/16/15. NF

## 2015-05-12 ENCOUNTER — Encounter: Payer: Self-pay | Admitting: Family Medicine

## 2015-05-12 ENCOUNTER — Ambulatory Visit (INDEPENDENT_AMBULATORY_CARE_PROVIDER_SITE_OTHER): Payer: Medicaid Other | Admitting: Family Medicine

## 2015-05-12 VITALS — BP 122/8 | Ht 69.0 in | Wt 234.0 lb

## 2015-05-12 DIAGNOSIS — M1711 Unilateral primary osteoarthritis, right knee: Secondary | ICD-10-CM | POA: Diagnosis not present

## 2015-05-12 DIAGNOSIS — R7303 Prediabetes: Secondary | ICD-10-CM

## 2015-05-12 LAB — POCT GLYCOSYLATED HEMOGLOBIN (HGB A1C): HEMOGLOBIN A1C: 5.3

## 2015-05-12 MED ORDER — ALBUTEROL SULFATE HFA 108 (90 BASE) MCG/ACT IN AERS: 2.0000 | INHALATION_SPRAY | Freq: Four times a day (QID) | RESPIRATORY_TRACT | Status: DC | PRN

## 2015-05-12 MED ORDER — CITALOPRAM HYDROBROMIDE 20 MG PO TABS: 20.0000 mg | ORAL_TABLET | Freq: Every day | ORAL | Status: DC

## 2015-05-12 MED ORDER — MELOXICAM 15 MG PO TABS: 15.0000 mg | ORAL_TABLET | Freq: Every day | ORAL | Status: DC

## 2015-05-12 NOTE — Progress Notes (Signed)
   Subjective:    Patient ID: Jim [name], male    DOB: May 18, 1956, 59 y.o.   MRN: [id number]  HPIpt arrives today for med check up on celexa. Pt states no problems with the med. Needs refills.   Prediabetes. A1C today 5.3.  Pt sees pain management.   Requesting referral for right knee pain. Sees Dr. Noemi Chapel. Needs referral sent. Hoping to have right knee replacement in December.   Declines flu vaccine.  Patient having significant pain in the right knee hurts with walking sitting standing   Review of Systems  Constitutional: Negative for activity change, appetite change and fatigue.  HENT: Negative for congestion.   Respiratory: Negative for cough.   Cardiovascular: Negative for chest pain.  Gastrointestinal: Negative for abdominal pain.  Endocrine: Negative for polydipsia and polyphagia.  Musculoskeletal: Positive for arthralgias.  Neurological: Negative for weakness.  Psychiatric/Behavioral: Negative for confusion.       Objective:   Physical Exam  Constitutional: He appears well-nourished. No distress.  Cardiovascular: Normal rate, regular rhythm and normal heart sounds.   No murmur heard. Pulmonary/Chest: Effort normal and breath sounds normal. No respiratory distress.  Musculoskeletal: He exhibits no edema.  Lymphadenopathy:    He has no cervical adenopathy.  Neurological: He is alert.  Psychiatric: His behavior is normal.  Vitals reviewed.         Assessment & Plan:  Right knee arthritis referral to orthopedist patient should be able to do fine for surgery for this.  Pain management patient is followed by pain management [place] they currently have him on 3 pills per day.  Prediabetes watch his diet stay physically active

## 2015-07-02 ENCOUNTER — Encounter (HOSPITAL_COMMUNITY): Payer: Self-pay | Admitting: Physician Assistant

## 2015-07-02 DIAGNOSIS — M1711 Unilateral primary osteoarthritis, right knee: Secondary | ICD-10-CM | POA: Diagnosis present

## 2015-07-02 NOTE — H&P (Signed)
TOTAL KNEE ADMISSION H&P  Patient is being admitted for right total knee arthroplasty.  Subjective:  Chief Complaint:right knee pain.  HPI: Jim [name], 59 y.o. male, has a history of pain and functional disability in the right knee due to arthritis and has failed non-surgical conservative treatments for greater than 12 weeks to includeNSAID's and/or analgesics, corticosteriod injections, viscosupplementation injections, flexibility and strengthening excercises, use of assistive devices, weight reduction as appropriate and activity modification.  Onset of symptoms was gradual, starting 10 years ago with gradually worsening course since that time. The patient noted prior procedures on the knee to include  arthroscopy and menisectomy on the right knee(s).  Patient currently rates pain in the right knee(s) at 10 out of 10 with activity. Patient has night pain, worsening of pain with activity and weight bearing, pain that interferes with activities of daily living, crepitus and joint swelling.  Patient has evidence of subchondral sclerosis, periarticular osteophytes and joint space narrowing by imaging studies.  There is no active infection.  Patient Active Problem List   Diagnosis Date Noted  . Primary localized osteoarthritis of right knee   . Chronic pain syndrome 12/10/2014  . Prediabetes 08/17/2013  . HTN (hypertension), benign 08/15/2013  . Hyperglycemia 08/15/2013  . Depression   . Gastric ulcer   . Arthritis   . Hyperlipidemia   . Bursitis of hip   . DJD (degenerative joint disease) of knee   . Ex-smoker   . Back pain    Past Medical History  Diagnosis Date  . Depression   . Gastric ulcer   . Arthritis   . Hyperlipidemia   . Bursitis of hip     Bilateral  . 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 Ex-smoker     Quit 11/07/2011  . Back pain   . History of colon polyps   . Primary localized osteoarthritis of right knee     Past Surgical  History  Procedure Laterality Date  . Knee arthroscopy      right knee  . Carpal tunnel release      right  . Tonsillectomy    . Finger amputation      partial left 5th finger  . Colonoscopy  01/31/2012    Procedure: COLONOSCOPY;  Surgeon: Daneil Dolin, MD;  [facility];  Service: Endoscopy;  Laterality: N/A;  7:30 AM  . Orif finger fracture      Right 5th finger, Amputation reatachment  . Total knee arthroplasty  09/04/2012    Procedure: TOTAL KNEE ARTHROPLASTY;  Surgeon: Lorn Junes, MD;  Location: [facility];  Service: Orthopedics;  Laterality: Left;  . Carpal tunnel release Left 05/16/13    Dr Noemi Chapel    No prescriptions prior to admission   Allergies  Allergen Reactions  . Doxycycline Nausea Only  . Pravastatin     myalgia    Social History  Substance Use Topics  . Smoking status: Former Smoker -- 2.00 packs/day for 40 years    Types: Cigarettes    Quit date: 10/31/2011  . Smokeless tobacco: Not on file  . Alcohol Use: Yes     Comment: 1 beer every 3-4 months    Family History  Problem Relation Age of Onset  . Heart disease Mother   . Heart disease Father   . Diabetes type I Father   . Heart disease Sister   . Heart disease Brother   . Hypertension  Father   . Cancer Father      Review of Systems  Constitutional: Negative.   HENT: Negative.   Eyes: Negative.   Respiratory: Negative for cough, hemoptysis, sputum production, shortness of breath and wheezing.   Cardiovascular: Negative.   Gastrointestinal: Negative.   Genitourinary: Negative.   Musculoskeletal: Positive for back pain and joint pain.  Skin: Negative.   Neurological: Negative.   Endo/Heme/Allergies: Negative.   Psychiatric/Behavioral: Negative.     Objective:  Physical Exam  Constitutional: He is oriented to person, place, and time. He appears well-developed and well-nourished.  HENT:  Head: Normocephalic and atraumatic.  Eyes: Conjunctivae are normal. Pupils are equal, round,  and reactive to light.  Neck: Neck supple.  Cardiovascular: Normal rate and regular rhythm.   Respiratory: Effort normal and breath sounds normal.  GI: Bowel sounds are normal.  Genitourinary:  Not pertinent to current symptomatology therefore not examined.  Musculoskeletal:   Examination of his right knee reveals varus deformity with pain.  1+ effusion.  Range of motion -5 to 115 degrees.  Knee is stable.  Examination of his left knee reveals well healed total knee incision without swelling or pain.  Full range of motion.  Knee is stable with normal patella tracking.    Neurological: He is alert and oriented to person, place, and time.  Skin: Skin is warm and dry.  Psychiatric: He has a normal mood and affect. His behavior is normal.    Vital signs in last 24 hours: Temp:  [97.8 F (36.6 C)] 97.8 F (36.6 C) (11/30 1500) Pulse Rate:  [65] 65 (11/30 1500) BP: (123)/(78) 123/78 mmHg (11/30 1500) SpO2:  [91 %] 91 % (11/30 1500) Weight:  [103.42 kg (228 lb)] 103.42 kg (228 lb) (11/30 1500)  Labs:   Estimated body mass index is 34.68 kg/(m^2) as calculated from the following:   Height as of this encounter: 5\' 8"  (1.727 m).   Weight as of this encounter: 103.42 kg (228 lb).   Imaging Review Plain radiographs demonstrate severe degenerative joint disease of the right knee(s). The overall alignment issignificant varus. The bone quality appears to be good for age and reported activity level.  Assessment/Plan:  End stage arthritis, right knee  Principal Problem:   Primary localized osteoarthritis of right knee Active Problems:   Depression   Gastric ulcer   Arthritis   Hyperlipidemia   Ex-smoker   Back pain   HTN (hypertension), benign   Prediabetes   Chronic pain syndrome  The patient history, physical examination, clinical judgment of the provider and imaging studies are consistent with end stage degenerative joint disease of the right knee(s) and total knee arthroplasty is  deemed medically necessary. The treatment options including medical management, injection therapy arthroscopy and arthroplasty were discussed at length. The risks and benefits of total knee arthroplasty were presented and reviewed. The risks due to aseptic loosening, infection, stiffness, patella tracking problems, thromboembolic complications and other imponderables were discussed. The patient acknowledged the explanation, agreed to proceed with the plan and consent was signed. Patient is being admitted for inpatient treatment for surgery, pain control, PT, OT, prophylactic antibiotics, VTE prophylaxis, progressive ambulation and ADL's and discharge planning. The patient is planning to be discharged home with home health services

## 2015-07-08 ENCOUNTER — Ambulatory Visit (INDEPENDENT_AMBULATORY_CARE_PROVIDER_SITE_OTHER): Payer: Medicaid Other | Admitting: Family Medicine

## 2015-07-08 ENCOUNTER — Encounter: Payer: Self-pay | Admitting: Family Medicine

## 2015-07-08 VITALS — BP 134/86 | HR 77 | Ht 69.0 in | Wt 228.0 lb

## 2015-07-08 DIAGNOSIS — I10 Essential (primary) hypertension: Secondary | ICD-10-CM | POA: Diagnosis not present

## 2015-07-08 DIAGNOSIS — M1711 Unilateral primary osteoarthritis, right knee: Secondary | ICD-10-CM | POA: Diagnosis not present

## 2015-07-08 NOTE — Progress Notes (Signed)
   Subjective:    Patient ID: Jim [name], male    DOB: Jan 19, 1956, 59 y.o.   MRN: [id number]  HPIpt arrives today for surgical clearance. Right knee total replacement on dec 19th with Dr. Noemi Chapel.  Pt states specialist wanted o2 checked. 02 today 99.  Pt states no other concerns today.  Patient does have severe osteoarthritis in both knees and also some in his hands Patient is having total knee replacement. He underwent one on the left side in the past without difficulty.  Review of Systems Patient denies any chest tightness pressure pain shortness of breath with activity.patient former smoker but denies shortness of breath with activity.    Objective:   Physical Exam His lungs are clear hearts regular pulse normal there is no wheezing no respiratory distress abdomen soft extremities no edema skin warm dry blood pressure good neck no masses throat no masses  EKG looks totally normal. Patient was given a copy of it.     Assessment & Plan:  Surgical clearance-this patient is approved for knee replacement he is to avoid all anti-inflammatories his EKG overall looks good no changes. Preoperative lab work recommended if there are any problems with this lab work anesthesia should notify us. This patient successfully underwent knee replacement on the left side without difficulty. The importance of following the direction of the specialist was discussed in order to avoid risk of infection and minimize risk of blood clots  Patient not hypoxic on room air O2 sat 99. Patient mentioned that he might in.having a spinal block for his total knee. I think this would be a good idea if possible.  Patient is to avoid all anti-inflammatories for the next several weeks until having surgery in follow the direction of the surgeon afterwards.

## 2015-07-11 ENCOUNTER — Encounter (HOSPITAL_COMMUNITY)
Admission: RE | Admit: 2015-07-11 | Discharge: 2015-07-11 | Disposition: A | Discharge status: Home / Self Care | Payer: Medicaid Other | Source: Ambulatory Visit | Attending: Physician Assistant | Admitting: Physician Assistant

## 2015-07-11 ENCOUNTER — Encounter (HOSPITAL_COMMUNITY): Payer: Self-pay

## 2015-07-11 ENCOUNTER — Encounter (HOSPITAL_COMMUNITY)
Admission: RE | Admit: 2015-07-11 | Discharge: 2015-07-11 | Disposition: A | Discharge status: Home / Self Care | Payer: Medicaid Other | Source: Ambulatory Visit | Attending: Orthopedic Surgery | Admitting: Orthopedic Surgery

## 2015-07-11 DIAGNOSIS — Z0183 Encounter for blood typing: Secondary | ICD-10-CM | POA: Diagnosis not present

## 2015-07-11 DIAGNOSIS — J449 Chronic obstructive pulmonary disease, unspecified: Secondary | ICD-10-CM | POA: Diagnosis not present

## 2015-07-11 DIAGNOSIS — Z01818 Encounter for other preprocedural examination: Secondary | ICD-10-CM | POA: Diagnosis not present

## 2015-07-11 DIAGNOSIS — Z01812 Encounter for preprocedural laboratory examination: Secondary | ICD-10-CM | POA: Insufficient documentation

## 2015-07-11 DIAGNOSIS — Z87891 Personal history of nicotine dependence: Secondary | ICD-10-CM | POA: Diagnosis not present

## 2015-07-11 DIAGNOSIS — M179 Osteoarthritis of knee, unspecified: Secondary | ICD-10-CM | POA: Diagnosis not present

## 2015-07-11 LAB — CBC WITH DIFFERENTIAL/PLATELET
Basophils Absolute: 0 10*3/uL (ref 0.0–0.1)
Basophils Relative: 0 %
EOS ABS: 0.2 10*3/uL (ref 0.0–0.7)
Eosinophils Relative: 3 %
HEMATOCRIT: 43.3 % (ref 39.0–52.0)
HEMOGLOBIN: 13.6 g/dL (ref 13.0–17.0)
LYMPHS ABS: 1.8 10*3/uL (ref 0.7–4.0)
LYMPHS PCT: 21 %
MCH: 29.8 pg (ref 26.0–34.0)
MCHC: 31.4 g/dL (ref 30.0–36.0)
MCV: 94.7 fL (ref 78.0–100.0)
Monocytes Absolute: 0.7 10*3/uL (ref 0.1–1.0)
Monocytes Relative: 8 %
NEUTROS ABS: 5.8 10*3/uL (ref 1.7–7.7)
NEUTROS PCT: 68 %
Platelets: 183 10*3/uL (ref 150–400)
RBC: 4.57 MIL/uL (ref 4.22–5.81)
RDW: 13.7 % (ref 11.5–15.5)
WBC: 8.5 10*3/uL (ref 4.0–10.5)

## 2015-07-11 LAB — COMPREHENSIVE METABOLIC PANEL
ALK PHOS: 56 U/L (ref 38–126)
ALT: 35 U/L (ref 17–63)
AST: 26 U/L (ref 15–41)
Albumin: 3.9 g/dL (ref 3.5–5.0)
Anion gap: 8 (ref 5–15)
BILIRUBIN TOTAL: 0.6 mg/dL (ref 0.3–1.2)
BUN: 23 mg/dL — ABNORMAL HIGH (ref 6–20)
CO2: 23 mmol/L (ref 22–32)
Calcium: 8.9 mg/dL (ref 8.9–10.3)
Chloride: 107 mmol/L (ref 101–111)
Creatinine, Ser: 1.29 mg/dL — ABNORMAL HIGH (ref 0.61–1.24)
GFR calc non Af Amer: 59 mL/min — ABNORMAL LOW (ref >60)
Glucose, Bld: 121 mg/dL — ABNORMAL HIGH (ref 65–99)
Potassium: 4.1 mmol/L (ref 3.5–5.1)
SODIUM: 138 mmol/L (ref 135–145)
Total Protein: 6.7 g/dL (ref 6.5–8.1)

## 2015-07-11 LAB — PROTIME-INR
INR: 1.07 (ref 0.00–1.49)
Prothrombin Time: 14.1 seconds (ref 11.6–15.2)

## 2015-07-11 LAB — SURGICAL PCR SCREEN
MRSA, PCR: NEGATIVE
Staphylococcus aureus: POSITIVE — AB

## 2015-07-11 LAB — APTT: aPTT: 31 seconds (ref 24–37)

## 2015-07-11 NOTE — Pre-Procedure Instructions (Addendum)
Jim [name]  07/11/2015      [facility] - [place], [place] - [place] [id number] [place] [id number] Phone: [phone] Fax: [phone]  [id number] - [place], [place] - [id number] [place] [address] Phone: [phone] Fax: [phone]  [id number] - [place], [place] - [address] [place] [address] Phone: [phone] Fax: [phone]    Your procedure is scheduled on 07/21/15.  Report to [facility] short stay admitting at 700 A.M.  Call this number if you have problems the morning of surgery:  [phone]   Remember:  Do not eat food or drink liquids after midnight.  Take these medicines the morning of surgery with A SIP OF WATER inhaler if needed(bring with you), celexa, oxycodone if needed   STOP all herbel meds, nsaids (aleve,naproxen,advil,ibuprofen) 5 days prior to surgery including vitamins, aspirin, mobic starting 07/16/15   Do not wear jewelry, make-up or nail polish.  Do not wear lotions, powders, or perfumes.  You may wear deodorant.  Do not shave 48 hours prior to surgery.  Men may shave face and neck.  Do not bring valuables to the hospital.  [facility] is not responsible for any belongings or valuables.  Contacts, dentures or bridgework may not be worn into surgery.  Leave your suitcase in the car.  After surgery it may be brought to your room.  For patients admitted to the hospital, discharge time will be determined by your treatment team.  Patients discharged the day of surgery will not be allowed to drive home.   Name and phone number of your driver:    Special instructions:   Special Instructions: [facility] - Preparing for Surgery  Before surgery, you can play an important role.  Because skin is not sterile, your skin needs to be as free of germs as possible.  You can reduce the  number of germs on you skin by washing with CHG (chlorahexidine gluconate) soap before surgery.  CHG is an antiseptic cleaner which kills germs and bonds with the skin to continue killing germs even after washing.  Please DO NOT use if you have an allergy to CHG or antibacterial soaps.  If your skin becomes reddened/irritated stop using the CHG and inform your nurse when you arrive at Short Stay.  Do not shave (including legs and underarms) for at least 48 hours prior to the first CHG shower.  You may shave your face.  Please follow these instructions carefully:   1.  Shower with CHG Soap the night before surgery and the morning of Surgery.  2.  If you choose to wash your hair, wash your hair first as usual with your normal shampoo.  3.  After you shampoo, rinse your hair and body thoroughly to remove the Shampoo.  4.  Use CHG as you would any other liquid soap.  You can apply chg directly  to the skin and wash gently with scrungie or a clean washcloth.  5.  Apply the CHG Soap to your body ONLY FROM THE NECK DOWN.  Do not use on open wounds or open sores.  Avoid contact with your eyes ears, mouth and genitals (private parts).  Wash genitals (private parts)       with your normal soap.  6.  Wash thoroughly, paying special attention to the area where your surgery will  be performed.  7.  Thoroughly rinse your body with warm water from the neck down.  8.  DO NOT shower/wash with your normal soap after using and rinsing off the CHG Soap.  9.  Pat yourself dry with a clean towel.            10.  Wear clean pajamas.            11.  Place clean sheets on your bed the night of your first shower and do not sleep with pets.  Day of Surgery  Do not apply any lotions/deodorants the morning of surgery.  Please wear clean clothes to the hospital/surgery center.  Please read over the following fact sheets that you were given. Pain Booklet, Coughing and Deep Breathing, Blood Transfusion Information, Total  Joint Packet, MRSA Information and Surgical Site Infection Prevention

## 2015-07-12 LAB — HEMOGLOBIN A1C
Hgb A1c MFr Bld: 6.3 % — ABNORMAL HIGH (ref 4.8–5.6)
Mean Plasma Glucose: 134 mg/dL

## 2015-07-12 LAB — TYPE AND SCREEN
ABO/RH(D): O POS
Antibody Screen: NEGATIVE

## 2015-07-12 LAB — URINE CULTURE: CULTURE: NO GROWTH

## 2015-07-20 MED ORDER — CEFAZOLIN SODIUM-DEXTROSE 2-3 GM-% IV SOLR
2.0000 g | INTRAVENOUS | Status: AC
  Administered 2015-07-21: 2 g via INTRAVENOUS
  Filled 2015-07-20: qty 50

## 2015-07-20 MED ORDER — CHLORHEXIDINE GLUCONATE 4 % EX LIQD: 60.0000 mL | Freq: Once | CUTANEOUS | Status: DC

## 2015-07-20 MED ORDER — LACTATED RINGERS IV SOLN: INTRAVENOUS | Status: DC

## 2015-07-21 ENCOUNTER — Encounter (HOSPITAL_COMMUNITY): Payer: Self-pay | Admitting: Surgery

## 2015-07-21 ENCOUNTER — Inpatient Hospital Stay (HOSPITAL_COMMUNITY)
Admission: RE | Admit: 2015-07-21 | Discharge: 2015-07-25 | DRG: 470 | Disposition: A | Discharge status: Home / Self Care | Payer: Medicaid Other | Source: Ambulatory Visit | Attending: Orthopedic Surgery | Admitting: Orthopedic Surgery

## 2015-07-21 ENCOUNTER — Inpatient Hospital Stay (HOSPITAL_COMMUNITY): Payer: Medicaid Other | Admitting: Certified Registered"

## 2015-07-21 ENCOUNTER — Encounter (HOSPITAL_COMMUNITY)
Admission: RE | Disposition: A | Discharge status: Home / Self Care | Payer: Self-pay | Source: Ambulatory Visit | Attending: Orthopedic Surgery

## 2015-07-21 DIAGNOSIS — Z87891 Personal history of nicotine dependence: Secondary | ICD-10-CM | POA: Diagnosis not present

## 2015-07-21 DIAGNOSIS — K259 Gastric ulcer, unspecified as acute or chronic, without hemorrhage or perforation: Secondary | ICD-10-CM | POA: Diagnosis present

## 2015-07-21 DIAGNOSIS — M179 Osteoarthritis of knee, unspecified: Secondary | ICD-10-CM | POA: Diagnosis present

## 2015-07-21 DIAGNOSIS — Z881 Allergy status to other antibiotic agents status: Secondary | ICD-10-CM

## 2015-07-21 DIAGNOSIS — Z888 Allergy status to other drugs, medicaments and biological substances status: Secondary | ICD-10-CM

## 2015-07-21 DIAGNOSIS — M1711 Unilateral primary osteoarthritis, right knee: Principal | ICD-10-CM | POA: Diagnosis present

## 2015-07-21 DIAGNOSIS — I10 Essential (primary) hypertension: Secondary | ICD-10-CM | POA: Diagnosis present

## 2015-07-21 DIAGNOSIS — M549 Dorsalgia, unspecified: Secondary | ICD-10-CM | POA: Diagnosis present

## 2015-07-21 DIAGNOSIS — F329 Major depressive disorder, single episode, unspecified: Secondary | ICD-10-CM | POA: Diagnosis present

## 2015-07-21 DIAGNOSIS — E785 Hyperlipidemia, unspecified: Secondary | ICD-10-CM | POA: Diagnosis present

## 2015-07-21 DIAGNOSIS — R7303 Prediabetes: Secondary | ICD-10-CM | POA: Diagnosis present

## 2015-07-21 DIAGNOSIS — F32A Depression, unspecified: Secondary | ICD-10-CM | POA: Diagnosis present

## 2015-07-21 DIAGNOSIS — M1710 Unilateral primary osteoarthritis, unspecified knee: Secondary | ICD-10-CM | POA: Diagnosis present

## 2015-07-21 DIAGNOSIS — M1990 Unspecified osteoarthritis, unspecified site: Secondary | ICD-10-CM | POA: Diagnosis present

## 2015-07-21 DIAGNOSIS — G894 Chronic pain syndrome: Secondary | ICD-10-CM | POA: Diagnosis present

## 2015-07-21 HISTORY — PX: TOTAL KNEE ARTHROPLASTY: SHX125

## 2015-07-21 HISTORY — DX: Unilateral primary osteoarthritis, right knee: M17.11

## 2015-07-21 SURGERY — ARTHROPLASTY, KNEE, TOTAL
Anesthesia: Monitor Anesthesia Care | Site: Knee | Laterality: Right

## 2015-07-21 MED ORDER — TIZANIDINE HCL 4 MG PO TABS
4.0000 mg | ORAL_TABLET | Freq: Three times a day (TID) | ORAL | Status: DC
  Administered 2015-07-21 – 2015-07-25 (×12): 4 mg via ORAL
  Filled 2015-07-21 (×12): qty 1

## 2015-07-21 MED ORDER — DOCUSATE SODIUM 100 MG PO CAPS
100.0000 mg | ORAL_CAPSULE | Freq: Two times a day (BID) | ORAL | Status: DC
  Administered 2015-07-21 – 2015-07-25 (×9): 100 mg via ORAL
  Filled 2015-07-21 (×9): qty 1

## 2015-07-21 MED ORDER — OXYCODONE HCL 5 MG PO TABS
15.0000 mg | ORAL_TABLET | ORAL | Status: DC | PRN
  Administered 2015-07-21 – 2015-07-25 (×22): 15 mg via ORAL
  Filled 2015-07-21 (×24): qty 3

## 2015-07-21 MED ORDER — ACETAMINOPHEN 325 MG PO TABS
650.0000 mg | ORAL_TABLET | Freq: Four times a day (QID) | ORAL | Status: DC | PRN
  Administered 2015-07-23: 650 mg via ORAL
  Filled 2015-07-21: qty 2

## 2015-07-21 MED ORDER — ACETAMINOPHEN 160 MG/5ML PO SOLN
325.0000 mg | ORAL | Status: DC | PRN
  Filled 2015-07-21: qty 20.3

## 2015-07-21 MED ORDER — ACETAMINOPHEN 325 MG PO TABS: 325.0000 mg | ORAL_TABLET | ORAL | Status: DC | PRN

## 2015-07-21 MED ORDER — FENTANYL CITRATE (PF) 100 MCG/2ML IJ SOLN
INTRAMUSCULAR | Status: AC
  Administered 2015-07-21: 50 ug via INTRAVENOUS
  Filled 2015-07-21: qty 2

## 2015-07-21 MED ORDER — BUPIVACAINE-EPINEPHRINE (PF) 0.25% -1:200000 IJ SOLN
INTRAMUSCULAR | Status: AC
  Filled 2015-07-21: qty 30

## 2015-07-21 MED ORDER — HYDROMORPHONE HCL 1 MG/ML IJ SOLN: 0.2500 mg | INTRAMUSCULAR | Status: DC | PRN

## 2015-07-21 MED ORDER — ADULT MULTIVITAMIN W/MINERALS CH
1.0000 | ORAL_TABLET | Freq: Every day | ORAL | Status: DC
  Administered 2015-07-21 – 2015-07-25 (×5): 1 via ORAL
  Filled 2015-07-21 (×5): qty 1

## 2015-07-21 MED ORDER — OXYCODONE HCL 5 MG PO TABS
5.0000 mg | ORAL_TABLET | Freq: Once | ORAL | Status: AC | PRN
  Administered 2015-07-21: 5 mg via ORAL

## 2015-07-21 MED ORDER — ALUM & MAG HYDROXIDE-SIMETH 200-200-20 MG/5ML PO SUSP
30.0000 mL | ORAL | Status: DC | PRN
Start: 2015-07-21 — End: 2015-07-25

## 2015-07-21 MED ORDER — BUPIVACAINE-EPINEPHRINE (PF) 0.5% -1:200000 IJ SOLN
INTRAMUSCULAR | Status: DC | PRN
  Administered 2015-07-21: 20 mL via PERINEURAL

## 2015-07-21 MED ORDER — ACETAMINOPHEN 160 MG/5ML PO SOLN: 325.0000 mg | ORAL | Status: DC | PRN

## 2015-07-21 MED ORDER — ONDANSETRON HCL 4 MG/2ML IJ SOLN
INTRAMUSCULAR | Status: AC
  Filled 2015-07-21: qty 2

## 2015-07-21 MED ORDER — ONDANSETRON HCL 4 MG/2ML IJ SOLN: 4.0000 mg | Freq: Four times a day (QID) | INTRAMUSCULAR | Status: DC | PRN

## 2015-07-21 MED ORDER — LACTATED RINGERS IV SOLN
INTRAVENOUS | Status: DC
  Administered 2015-07-21 (×2): via INTRAVENOUS

## 2015-07-21 MED ORDER — SODIUM CHLORIDE 0.9 % IJ SOLN
INTRAMUSCULAR | Status: AC
  Filled 2015-07-21: qty 10

## 2015-07-21 MED ORDER — BUPIVACAINE IN DEXTROSE 0.75-8.25 % IT SOLN
INTRATHECAL | Status: DC | PRN
  Administered 2015-07-21: 2 mL via INTRATHECAL

## 2015-07-21 MED ORDER — EPHEDRINE SULFATE 50 MG/ML IJ SOLN
INTRAMUSCULAR | Status: AC
  Filled 2015-07-21: qty 1

## 2015-07-21 MED ORDER — FENTANYL CITRATE (PF) 250 MCG/5ML IJ SOLN
INTRAMUSCULAR | Status: AC
  Filled 2015-07-21: qty 5

## 2015-07-21 MED ORDER — OXYCODONE HCL 5 MG PO TABS
ORAL_TABLET | ORAL | Status: AC
  Filled 2015-07-21: qty 1

## 2015-07-21 MED ORDER — ASPIRIN EC 325 MG PO TBEC
325.0000 mg | DELAYED_RELEASE_TABLET | Freq: Two times a day (BID) | ORAL | Status: DC
Start: 2015-07-21 — End: 2015-07-25
  Administered 2015-07-21 – 2015-07-25 (×8): 325 mg via ORAL
  Filled 2015-07-21 (×8): qty 1

## 2015-07-21 MED ORDER — CELECOXIB 200 MG PO CAPS
200.0000 mg | ORAL_CAPSULE | Freq: Two times a day (BID) | ORAL | Status: DC
  Administered 2015-07-21 – 2015-07-25 (×9): 200 mg via ORAL
  Filled 2015-07-21 (×9): qty 1

## 2015-07-21 MED ORDER — PROPOFOL 10 MG/ML IV BOLUS
INTRAVENOUS | Status: AC
  Filled 2015-07-21: qty 20

## 2015-07-21 MED ORDER — POLYETHYLENE GLYCOL 3350 17 G PO PACK
17.0000 g | PACK | Freq: Two times a day (BID) | ORAL | Status: DC
  Administered 2015-07-21 – 2015-07-25 (×9): 17 g via ORAL
  Filled 2015-07-21 (×9): qty 1

## 2015-07-21 MED ORDER — OXYCODONE HCL 5 MG PO TABS: 5.0000 mg | ORAL_TABLET | Freq: Once | ORAL | Status: DC | PRN

## 2015-07-21 MED ORDER — METOCLOPRAMIDE HCL 5 MG/ML IJ SOLN: 5.0000 mg | Freq: Three times a day (TID) | INTRAMUSCULAR | Status: DC | PRN

## 2015-07-21 MED ORDER — DEXAMETHASONE SODIUM PHOSPHATE 10 MG/ML IJ SOLN
INTRAMUSCULAR | Status: AC
  Filled 2015-07-21: qty 1

## 2015-07-21 MED ORDER — OXYCODONE HCL 5 MG/5ML PO SOLN: 5.0000 mg | Freq: Once | ORAL | Status: AC | PRN

## 2015-07-21 MED ORDER — POTASSIUM CHLORIDE IN NACL 20-0.9 MEQ/L-% IV SOLN
INTRAVENOUS | Status: DC
  Administered 2015-07-21 – 2015-07-22 (×2): via INTRAVENOUS
  Filled 2015-07-21 (×2): qty 1000

## 2015-07-21 MED ORDER — PROPOFOL 500 MG/50ML IV EMUL
INTRAVENOUS | Status: DC | PRN
  Administered 2015-07-21: 50 ug/kg/min via INTRAVENOUS

## 2015-07-21 MED ORDER — METHOCARBAMOL 1000 MG/10ML IJ SOLN
500.0000 mg | Freq: Once | INTRAVENOUS | Status: AC
  Administered 2015-07-21: 500 mg via INTRAVENOUS
  Filled 2015-07-21: qty 5

## 2015-07-21 MED ORDER — FENTANYL CITRATE (PF) 100 MCG/2ML IJ SOLN
50.0000 ug | Freq: Once | INTRAMUSCULAR | Status: AC
  Administered 2015-07-21: 50 ug via INTRAVENOUS

## 2015-07-21 MED ORDER — POVIDONE-IODINE 7.5 % EX SOLN
Freq: Once | CUTANEOUS | Status: DC
  Filled 2015-07-21: qty 118

## 2015-07-21 MED ORDER — LIDOCAINE HCL (CARDIAC) 20 MG/ML IV SOLN
INTRAVENOUS | Status: AC
  Filled 2015-07-21: qty 5

## 2015-07-21 MED ORDER — HYDROMORPHONE HCL 1 MG/ML IJ SOLN
INTRAMUSCULAR | Status: AC
  Filled 2015-07-21: qty 1

## 2015-07-21 MED ORDER — METOCLOPRAMIDE HCL 5 MG PO TABS: 5.0000 mg | ORAL_TABLET | Freq: Three times a day (TID) | ORAL | Status: DC | PRN

## 2015-07-21 MED ORDER — MIDAZOLAM HCL 2 MG/2ML IJ SOLN: 2.0000 mg | Freq: Once | INTRAMUSCULAR | Status: DC

## 2015-07-21 MED ORDER — OXYCODONE HCL 5 MG/5ML PO SOLN
5.0000 mg | Freq: Once | ORAL | Status: DC | PRN
Start: 2015-07-21 — End: 2015-07-21

## 2015-07-21 MED ORDER — BUPIVACAINE-EPINEPHRINE 0.25% -1:200000 IJ SOLN
INTRAMUSCULAR | Status: DC | PRN
Start: 2015-07-21 — End: 2015-07-21
  Administered 2015-07-21: 30 mL

## 2015-07-21 MED ORDER — ALBUTEROL SULFATE (2.5 MG/3ML) 0.083% IN NEBU: 3.0000 mL | INHALATION_SOLUTION | Freq: Four times a day (QID) | RESPIRATORY_TRACT | Status: DC | PRN

## 2015-07-21 MED ORDER — CEFAZOLIN SODIUM-DEXTROSE 2-3 GM-% IV SOLR
2.0000 g | Freq: Four times a day (QID) | INTRAVENOUS | Status: AC
  Administered 2015-07-21 (×2): 2 g via INTRAVENOUS
  Filled 2015-07-21 (×2): qty 50

## 2015-07-21 MED ORDER — HYDROMORPHONE HCL 1 MG/ML IJ SOLN
1.0000 mg | INTRAMUSCULAR | Status: DC | PRN
  Administered 2015-07-21 – 2015-07-24 (×20): 1 mg via INTRAVENOUS
  Filled 2015-07-21 (×20): qty 1

## 2015-07-21 MED ORDER — CITALOPRAM HYDROBROMIDE 20 MG PO TABS
20.0000 mg | ORAL_TABLET | Freq: Every day | ORAL | Status: DC
  Administered 2015-07-21 – 2015-07-25 (×5): 20 mg via ORAL
  Filled 2015-07-21 (×5): qty 1

## 2015-07-21 MED ORDER — SODIUM CHLORIDE 0.9 % IR SOLN
Status: DC | PRN
  Administered 2015-07-21: 3000 mL

## 2015-07-21 MED ORDER — PHENOL 1.4 % MT LIQD
1.0000 | OROMUCOSAL | Status: DC | PRN
Start: 2015-07-21 — End: 2015-07-25

## 2015-07-21 MED ORDER — HYDROMORPHONE HCL 1 MG/ML IJ SOLN
0.2500 mg | INTRAMUSCULAR | Status: DC | PRN
  Administered 2015-07-21 (×2): 0.5 mg via INTRAVENOUS

## 2015-07-21 MED ORDER — DEXAMETHASONE SODIUM PHOSPHATE 10 MG/ML IJ SOLN
10.0000 mg | Freq: Three times a day (TID) | INTRAMUSCULAR | Status: AC
  Administered 2015-07-21 – 2015-07-23 (×6): 10 mg via INTRAVENOUS
  Filled 2015-07-21 (×6): qty 1

## 2015-07-21 MED ORDER — SUCCINYLCHOLINE CHLORIDE 20 MG/ML IJ SOLN
INTRAMUSCULAR | Status: AC
  Filled 2015-07-21: qty 1

## 2015-07-21 MED ORDER — CEFUROXIME SODIUM 1.5 G IJ SOLR
INTRAMUSCULAR | Status: DC | PRN
  Administered 2015-07-21: 1.5 g

## 2015-07-21 MED ORDER — MIDAZOLAM HCL 2 MG/2ML IJ SOLN
INTRAMUSCULAR | Status: AC
  Administered 2015-07-21: 2 mg
  Filled 2015-07-21: qty 2

## 2015-07-21 MED ORDER — ONDANSETRON HCL 4 MG/2ML IJ SOLN
INTRAMUSCULAR | Status: DC | PRN
  Administered 2015-07-21: 4 mg via INTRAVENOUS

## 2015-07-21 MED ORDER — DIPHENHYDRAMINE HCL 12.5 MG/5ML PO ELIX: 12.5000 mg | ORAL_SOLUTION | ORAL | Status: DC | PRN

## 2015-07-21 MED ORDER — 0.9 % SODIUM CHLORIDE (POUR BTL) OPTIME
TOPICAL | Status: DC | PRN
  Administered 2015-07-21: 1000 mL

## 2015-07-21 MED ORDER — ONDANSETRON HCL 4 MG PO TABS: 4.0000 mg | ORAL_TABLET | Freq: Four times a day (QID) | ORAL | Status: DC | PRN

## 2015-07-21 MED ORDER — MENTHOL 3 MG MT LOZG: 1.0000 | LOZENGE | OROMUCOSAL | Status: DC | PRN

## 2015-07-21 MED ORDER — CEFUROXIME SODIUM 1.5 G IJ SOLR
INTRAMUSCULAR | Status: AC
  Filled 2015-07-21: qty 1.5

## 2015-07-21 MED ORDER — MORPHINE SULFATE ER 60 MG PO TBCR
60.0000 mg | EXTENDED_RELEASE_TABLET | Freq: Two times a day (BID) | ORAL | Status: DC
  Administered 2015-07-21 – 2015-07-22 (×3): 60 mg via ORAL
  Filled 2015-07-21 (×3): qty 1

## 2015-07-21 MED ORDER — ACETAMINOPHEN 650 MG RE SUPP: 650.0000 mg | Freq: Four times a day (QID) | RECTAL | Status: DC | PRN

## 2015-07-21 MED ORDER — DEXAMETHASONE SODIUM PHOSPHATE 10 MG/ML IJ SOLN
INTRAMUSCULAR | Status: DC | PRN
  Administered 2015-07-21: 10 mg via INTRAVENOUS

## 2015-07-21 SURGICAL SUPPLY — 81 items
APL SKNCLS STERI-STRIP NONHPOA (GAUZE/BANDAGES/DRESSINGS) ×1
BANDAGE ESMARK 6X9 LF (GAUZE/BANDAGES/DRESSINGS) ×1 IMPLANT
BENZOIN TINCTURE PRP APPL 2/3 (GAUZE/BANDAGES/DRESSINGS) ×3 IMPLANT
BLADE SAGITTAL 25.0X1.19X90 (BLADE) ×2 IMPLANT
BLADE SAGITTAL 25.0X1.19X90MM (BLADE) ×1
BLADE SAW RECIP 87.9 MT (BLADE) ×2 IMPLANT
BLADE SAW SAG 90X13X1.27 (BLADE) ×3 IMPLANT
BLADE SURG 10 STRL SS (BLADE) ×10 IMPLANT
BNDG CMPR 9X6 STRL LF SNTH (GAUZE/BANDAGES/DRESSINGS) ×1
BNDG CMPR MED 15X6 ELC VLCR LF (GAUZE/BANDAGES/DRESSINGS) ×1
BNDG ELASTIC 6X15 VLCR STRL LF (GAUZE/BANDAGES/DRESSINGS) ×3 IMPLANT
BNDG ESMARK 6X9 LF (GAUZE/BANDAGES/DRESSINGS) ×3
BOWL SMART MIX CTS (DISPOSABLE) ×3 IMPLANT
CAPT KNEE TOTAL 3 ATTUNE ×2 IMPLANT
CEMENT HV SMART SET (Cement) ×6 IMPLANT
CLOSURE STERI-STRIP 1/2X4 (GAUZE/BANDAGES/DRESSINGS) ×1
CLOSURE WOUND 1/2 X4 (GAUZE/BANDAGES/DRESSINGS) ×1
CLSR STERI-STRIP ANTIMIC 1/2X4 (GAUZE/BANDAGES/DRESSINGS) ×1 IMPLANT
COVER SURGICAL LIGHT HANDLE (MISCELLANEOUS) ×3 IMPLANT
CUFF TOURNIQUET SINGLE 34IN LL (TOURNIQUET CUFF) ×3 IMPLANT
CUFF TOURNIQUET SINGLE 44IN (TOURNIQUET CUFF) IMPLANT
DECANTER SPIKE VIAL GLASS SM (MISCELLANEOUS) ×3 IMPLANT
DRAPE EXTREMITY T 121X128X90 (DRAPE) ×3 IMPLANT
DRAPE INCISE IOBAN 66X45 STRL (DRAPES) ×3 IMPLANT
DRAPE PROXIMA HALF (DRAPES) ×3 IMPLANT
DRAPE U-SHAPE 47X51 STRL (DRAPES) ×3 IMPLANT
DRSG AQUACEL AG ADV 3.5X10 (GAUZE/BANDAGES/DRESSINGS) ×2 IMPLANT
DRSG AQUACEL AG ADV 3.5X14 (GAUZE/BANDAGES/DRESSINGS) ×3 IMPLANT
DRSG PAD ABDOMINAL 8X10 ST (GAUZE/BANDAGES/DRESSINGS) ×2 IMPLANT
DURAPREP 26ML APPLICATOR (WOUND CARE) ×6 IMPLANT
ELECT CAUTERY BLADE 6.4 (BLADE) ×3 IMPLANT
ELECT REM PT RETURN 9FT ADLT (ELECTROSURGICAL) ×3
ELECTRODE REM PT RTRN 9FT ADLT (ELECTROSURGICAL) ×1 IMPLANT
EVACUATOR 1/8 PVC DRAIN (DRAIN) IMPLANT
FACESHIELD WRAPAROUND (MASK) ×3 IMPLANT
FACESHIELD WRAPAROUND OR TEAM (MASK) ×1 IMPLANT
GAUZE SPONGE 4X4 12PLY STRL (GAUZE/BANDAGES/DRESSINGS) ×1 IMPLANT
GLOVE BIO SURGEON STRL SZ7 (GLOVE) ×3 IMPLANT
GLOVE BIOGEL PI IND STRL 7.0 (GLOVE) ×1 IMPLANT
GLOVE BIOGEL PI IND STRL 7.5 (GLOVE) ×1 IMPLANT
GLOVE BIOGEL PI INDICATOR 7.0 (GLOVE) ×2
GLOVE BIOGEL PI INDICATOR 7.5 (GLOVE) ×2
GLOVE SS BIOGEL STRL SZ 7.5 (GLOVE) ×1 IMPLANT
GLOVE SUPERSENSE BIOGEL SZ 7.5 (GLOVE) ×2
GOWN STRL REUS W/ TWL LRG LVL3 (GOWN DISPOSABLE) ×1 IMPLANT
GOWN STRL REUS W/ TWL XL LVL3 (GOWN DISPOSABLE) ×2 IMPLANT
GOWN STRL REUS W/TWL LRG LVL3 (GOWN DISPOSABLE) ×3
GOWN STRL REUS W/TWL XL LVL3 (GOWN DISPOSABLE) ×6
HANDPIECE INTERPULSE COAX TIP (DISPOSABLE) ×3
HOOD PEEL AWAY FACE SHEILD DIS (HOOD) ×6 IMPLANT
IMMOBILIZER KNEE 22 (SOFTGOODS) ×2 IMPLANT
IMMOBILIZER KNEE 22 UNIV (SOFTGOODS) ×1 IMPLANT
KIT BASIN OR (CUSTOM PROCEDURE TRAY) ×3 IMPLANT
KIT ROOM TURNOVER OR (KITS) ×3 IMPLANT
MANIFOLD NEPTUNE II (INSTRUMENTS) ×3 IMPLANT
MARKER SKIN DUAL TIP RULER LAB (MISCELLANEOUS) ×3 IMPLANT
NDL 18GX1X1/2 (RX/OR ONLY) (NEEDLE) IMPLANT
NEEDLE 18GX1X1/2 (RX/OR ONLY) (NEEDLE) ×3 IMPLANT
NS IRRIG 1000ML POUR BTL (IV SOLUTION) ×3 IMPLANT
PACK TOTAL JOINT (CUSTOM PROCEDURE TRAY) ×3 IMPLANT
PACK UNIVERSAL I (CUSTOM PROCEDURE TRAY) ×3 IMPLANT
PAD ARMBOARD 7.5X6 YLW CONV (MISCELLANEOUS) ×6 IMPLANT
PADDING CAST COTTON 6X4 STRL (CAST SUPPLIES) ×3 IMPLANT
RUBBERBAND STERILE (MISCELLANEOUS) ×3 IMPLANT
SET HNDPC FAN SPRY TIP SCT (DISPOSABLE) ×1 IMPLANT
STRIP CLOSURE SKIN 1/2X4 (GAUZE/BANDAGES/DRESSINGS) ×2 IMPLANT
SUCTION FRAZIER TIP 10 FR DISP (SUCTIONS) ×3 IMPLANT
SUT MNCRL AB 3-0 PS2 18 (SUTURE) ×3 IMPLANT
SUT VIC AB 0 CT1 27 (SUTURE) ×6
SUT VIC AB 0 CT1 27XBRD ANBCTR (SUTURE) ×2 IMPLANT
SUT VIC AB 1 CT1 27 (SUTURE) ×3
SUT VIC AB 1 CT1 27XBRD ANBCTR (SUTURE) ×1 IMPLANT
SUT VIC AB 2-0 CT1 27 (SUTURE) ×6
SUT VIC AB 2-0 CT1 TAPERPNT 27 (SUTURE) ×2 IMPLANT
SYR 30ML SLIP (SYRINGE) ×3 IMPLANT
TOWEL OR 17X24 6PK STRL BLUE (TOWEL DISPOSABLE) ×3 IMPLANT
TOWEL OR 17X26 10 PK STRL BLUE (TOWEL DISPOSABLE) ×3 IMPLANT
TRAY FOLEY CATH 16FR SILVER (SET/KITS/TRAYS/PACK) ×3 IMPLANT
TUBE CONNECTING 12'X1/4 (SUCTIONS) ×1
TUBE CONNECTING 12X1/4 (SUCTIONS) ×2 IMPLANT
YANKAUER SUCT BULB TIP NO VENT (SUCTIONS) ×3 IMPLANT

## 2015-07-21 NOTE — Evaluation (Signed)
Physical Therapy Evaluation Patient Details Name: Jim [name] MRN: [id number] DOB: 18-Apr-1956 Today's Date: 07/21/2015   History of Present Illness  59 y.o. male admitted to [facility] on 07/21/15 for elective R TKA.  Pt with significant PMHx of arthritis "everywhere" (back and neck sepcifically reported by pt), and L TKA.    Clinical Impression  Pt is POD #0 and is moving well supervision to min guard assist for in-room gait with RW.  Pt knee education and exercises initiated.  I anticipate he will do well as he has had his other knee done a few years ago.   PT to follow acutely for deficits listed below.       Follow Up Recommendations Home health PT;Supervision for mobility/OOB    Equipment Recommendations  None recommended by PT    Recommendations for Other Services   NA    Precautions / Restrictions Precautions Precautions: Knee Precaution Booklet Issued: Yes (comment) Precaution Comments: exercise handout given and precaution reviewed Required Braces or Orthoses: Knee Immobilizer - Right Knee Immobilizer - Right: Other (comment) (until d/c'd) Restrictions Weight Bearing Restrictions: Yes RLE Weight Bearing: Weight bearing as tolerated      Mobility  Bed Mobility Overal bed mobility: Needs Assistance Bed Mobility: Supine to Sit     Supine to sit: Min assist     General bed mobility comments: Min assist to help progress right leg over EOB.   Transfers Overall transfer level: Needs assistance Equipment used: Rolling walker (2 wheeled) Transfers: Sit to/from Stand Sit to Stand: Supervision         General transfer comment: supervision for safety.  Heavy reliance on arms for transitions. Cues for safe hand placement.   Ambulation/Gait Ambulation/Gait assistance: Min guard Ambulation Distance (Feet): 15 Feet Assistive device: Rolling walker (2 wheeled) Gait Pattern/deviations: Step-to pattern;Antalgic     General Gait Details: Pt with moderately antalgic gait  pattern, no cues needed for sequencing.          Balance Overall balance assessment: Needs assistance Sitting-balance support: Feet supported;No upper extremity supported Sitting balance-Leahy Scale: Good     Standing balance support: Bilateral upper extremity supported Standing balance-Leahy Scale: Poor                               Pertinent Vitals/Pain Pain Assessment: 0-10 Pain Score: 8  Pain Location: right knee Pain Descriptors / Indicators: Aching;Burning Pain Intervention(s): Limited activity within patient's tolerance;Monitored during session;Repositioned;Patient requesting pain meds-RN notified    Home Living Family/patient expects to be discharged to:: Private residence Living Arrangements: Other relatives;Spouse/significant other (staying at "wife's house" , but normally lives with sister) Available Help at Discharge: Family;Available 24 hours/day Type of Home: House Home Access: Stairs to enter Entrance Stairs-Rails: Right;Left (cannot reach both) Entrance Stairs-Number of Steps: 5 Home Layout: One level Home Equipment: Walker - 2 wheels;Cane - single point;Bedside commode      Prior Function Level of Independence: Independent with assistive device(s)         Comments: used cane for ambulation PTA     Hand Dominance   Dominant Hand: Right    Extremity/Trunk Assessment   Upper Extremity Assessment: Defer to OT evaluation           Lower Extremity Assessment: RLE deficits/detail RLE Deficits / Details: right leg with normal post op pain and weakness.  Ankle at least 3/5, knee 2/5, hip 3-/5 (flexion).     Cervical / Trunk  Assessment: Other exceptions  Communication   Communication: HOH (has hearing aide, but not here at the hospital)  Cognition Arousal/Alertness: Awake/alert Behavior During Therapy: WFL for tasks assessed/performed Overall Cognitive Status: Within Functional Limits for tasks assessed                          Exercises Total Joint Exercises Ankle Circles/Pumps: AROM;Both;20 reps;Supine      Assessment/Plan    PT Assessment Patient needs continued PT services  PT Diagnosis Difficulty walking;Abnormality of gait;Generalized weakness;Acute pain   PT Problem List Decreased strength;Decreased range of motion;Decreased activity tolerance;Decreased balance;Decreased mobility;Decreased knowledge of use of DME;Decreased knowledge of precautions;Pain  PT Treatment Interventions DME instruction;Gait training;Stair training;Functional mobility training;Therapeutic activities;Therapeutic exercise;Balance training;Neuromuscular re-education;Patient/family education;Manual techniques;Modalities   PT Goals (Current goals can be found in the Care Plan section) Acute Rehab PT Goals Patient Stated Goal: to go home later this week before Christmas PT Goal Formulation: With patient Time For Goal Achievement: 07/28/15    Frequency 7X/week           End of Session Equipment Utilized During Treatment: Right knee immobilizer Activity Tolerance: Patient limited by pain Patient left: in chair;with call bell/phone within reach;with nursing/sitter in room Nurse Communication: Mobility status;Weight bearing status (to RN tech)         Time: 1455-1530 PT Time Calculation (min) (ACUTE ONLY): 35 min   Charges:   PT Evaluation $Initial PT Evaluation Tier I: 1 Procedure PT Treatments $Gait Training: 8-22 mins        Jim [name], PT, DPT [phone]   07/21/2015, 3:38 PM

## 2015-07-21 NOTE — Op Note (Signed)
MRN:     [id number] DOB/AGE:    1955/12/19 / 59 y.o.       OPERATIVE REPORT    DATE OF PROCEDURE:  07/21/2015       PREOPERATIVE DIAGNOSIS:   PRIMARY LOCALIZED OSTEOARTHRITIS RIGHT KNEE      Estimated body mass index is 34.39 kg/(m^2) as calculated from the following:   Height as of this encounter: 5\' 9"  (1.753 m).   Weight as of this encounter: 105.688 kg (233 lb).                                                        POSTOPERATIVE DIAGNOSIS:   PRIMARY LOCALIZED OSTEOARTHRITIS RIGHT KNEE                                                                      PROCEDURE:  Procedure(s): RIGHT TOTAL KNEE ARTHROPLASTY Using Depuy Attune RP implants #6 Femur, #6Tibia, 7mm attune RP bearing, 36 Patella     SURGEON: Jaren Vanetten A    ASSISTANT:  Kirstin Shepperson PA-C   (Present and scrubbed throughout the case, critical for assistance with exposure, retraction, instrumentation, and closure.)         ANESTHESIA: Spinal with Femoral Nerve Block    TOURNIQUET TIME: [id number]   COMPLICATIONS:  None     SPECIMENS: None   INDICATIONS FOR PROCEDURE: The patient has  PRIMARY LOCALIZED OSTEOARTHRITIS RIGHT KNEE, varus deformities, XR shows bone on bone arthritis. Patient has failed all conservative measures including anti-inflammatory medicines, narcotics, attempts at  exercise and weight loss, cortisone injections and viscosupplementation.  Risks and benefits of surgery have been discussed, questions answered.   DESCRIPTION OF PROCEDURE: The patient identified by armband, received  right femoral nerve block and IV antibiotics, in the holding area at [facility]. Patient taken to the operating room, appropriate anesthetic  monitors were attached General endotracheal anesthesia induced with  the patient in supine position, Foley catheter was inserted. Tourniquet  applied high to the operative thigh. Lateral post and foot positioner  applied to the table, the lower extremity was then prepped  and draped  in usual sterile fashion from the ankle to the tourniquet. Time-out procedure was performed. The limb was wrapped with an Esmarch bandage and the tourniquet inflated to 365 mmHg. We began the operation by making the anterior midline incision starting at handbreadth above the patella going over the patella 1 cm medial to and  4 cm distal to the tibial tubercle. Small bleeders in the skin and the  subcutaneous tissue identified and cauterized. Transverse retinaculum was incised and reflected medially and a medial parapatellar arthrotomy was accomplished. the patella was everted and theprepatellar fat pad resected. The superficial medial collateral  ligament was then elevated from anterior to posterior along the proximal  flare of the tibia and anterior half of the menisci resected. The knee was hyperflexed exposing bone on bone arthritis. Peripheral and notch osteophytes as well as the cruciate ligaments were then resected. We continued to  work our way around posteriorly along the proximal tibia, and externally  rotated the tibia subluxing it out from underneath the femur. A McHale  retractor was placed through the notch and a lateral Hohmann retractor  placed, and we then drilled through the proximal tibia in line with the  axis of the tibia followed by an intramedullary guide rod and 2-degree  posterior slope cutting guide. The tibial cutting guide was pinned into place  allowing resection of 4 mm of bone medially and about 6 mm of bone  laterally because of her varus deformity. Satisfied with the tibial resection, we then  entered the distal femur 2 mm anterior to the PCL origin with the  intramedullary guide rod and applied the distal femoral cutting guide  set at 69mm, with 5 degrees of valgus. This was pinned along the  epicondylar axis. At this point, the distal femoral cut was accomplished without difficulty. We then sized for a #6 femoral component and pinned the guide in 3  degrees of external rotation.The chamfer cutting guide was pinned into place. The anterior, posterior, and chamfer cuts were accomplished without difficulty followed by  the Attune RP box cutting guide and the box cut. We also removed posterior osteophytes from the posterior femoral condyles. At this  time, the knee was brought into full extension. We checked our  extension and flexion gaps and found them symmetric at 60mm.  The patella thickness measured at 25 mm. We set the cutting guide at 15 and removed the posterior 9.5-10 mm  of the patella sized for 38 button and drilled the lollipop. The knee  was then once again hyperflexed exposing the proximal tibia. We sized for a #6 tibial base plate, applied the smokestack and the conical reamer followed by the the Delta fin keel punch. We then hammered into place the Attune RP trial femoral component, inserted a 6-mm trial bearing, trial patellar button, and took the knee through range of motion from 0-130 degrees. No thumb pressure was required for patellar  tracking. At this point, all trial components were removed, a double batch of DePuy HV cement with 1500 mg of Zinacef was mixed and applied to all bony metallic mating surfaces except for the posterior condyles of the femur itself. In order, we  hammered into place the tibial tray and removed excess cement, the femoral component and removed excess cement, a 6-mm Attune RP bearing  was inserted, and the knee brought to full extension with compression.  The patellar button was clamped into place, and excess cement  removed. While the cement cured the wound was irrigated out with normal saline solution pulse lavage.. Ligament stability and patellar tracking were checked and found to be excellent. The tourniquet was then released and hemostasis was obtained with cautery. The parapatellar arthrotomy was closed with  #1 ethibond suture. The subcutaneous tissue with 0 and 2-0 undyed  Vicryl suture, and 4-0  Monocryl.. A dressing of Xeroform,  4 x 4, dressing sponges, Webril, and Ace wrap applied. Needle and sponge count were correct times 2.The patient awakened, extubated, and taken to recovery room without difficulty. Vascular status was normal, pulses 2+ and symmetric.   Jim [name] A 07/21/2015, 11:04 AM

## 2015-07-21 NOTE — Interval H&P Note (Signed)
History and Physical Interval Note:  07/21/2015 8:50 AM  [name]  has presented today for surgery, with the diagnosis of PRIMARY LOCALIZED OA RIGHT KNEE  The various methods of treatment have been discussed with the patient and family. After consideration of risks, benefits and other options for treatment, the patient has consented to  Procedure(s): RIGHT TOTAL KNEE ARTHROPLASTY (Right) as [name] surgical intervention .  The patient's history has been reviewed, patient examined, no change in status, stable for surgery.  I have reviewed the patient's chart and labs.  Questions were answered to the patient's satisfaction.     Jim [name]

## 2015-07-21 NOTE — Transfer of Care (Signed)
Immediate Anesthesia Transfer of Care Note  Patient: Jim [name]  Procedure(s) Performed: Procedure(s): RIGHT TOTAL KNEE ARTHROPLASTY (Right)  Patient Location: PACU  Anesthesia Type:MAC and Spinal  Level of Consciousness: awake, alert  and oriented  Airway & Oxygen Therapy: Patient Spontanous Breathing  Post-op Assessment: Report given to RN  Post vital signs: Reviewed and stable  Last Vitals:  Filed Vitals:   07/21/15 0900 07/21/15 0905  BP: 140/73 120/72  Pulse: 69 73  Temp:    Resp: 17 14    Complications: No apparent anesthesia complications

## 2015-07-21 NOTE — Care Management (Signed)
Utilization review completed. Merrily Tegeler, RN Case Manager [phone]. 

## 2015-07-21 NOTE — Progress Notes (Signed)
Orthopedic Tech Progress Note Patient Details:  Jim [name] 09-06-1955 [id number]  Patient ID: Jim [name], male   DOB: October 12, 1955, 59 y.o.   MRN: [id number] Pt. On cpm at 1815 0-90  [name] 07/21/2015, 6:15 PM

## 2015-07-21 NOTE — Anesthesia Preprocedure Evaluation (Addendum)
Anesthesia Evaluation  Patient identified by MRN, date of birth, ID band Patient awake    Reviewed: Allergy & Precautions, NPO status , Patient's Chart, lab work & pertinent test results  History of Anesthesia Complications Negative for: history of anesthetic complications  Airway Mallampati: IV  TM Distance: >3 FB Neck ROM: Full    Dental  (+) Edentulous Upper,    Pulmonary neg shortness of breath, neg sleep apnea, COPD,  COPD inhaler, neg recent URI, former smoker,    breath sounds clear to auscultation       Cardiovascular hypertension, (-) angina(-) Past MI and (-) CHF  Rhythm:Regular     Neuro/Psych PSYCHIATRIC DISORDERS Depression negative neurological ROS     GI/Hepatic Neg liver ROS,   Endo/Other  Morbid obesity  Renal/GU Renal InsufficiencyRenal disease     Musculoskeletal  (+) Arthritis ,   Abdominal   Peds  Hematology   Anesthesia Other Findings   Reproductive/Obstetrics                            Anesthesia Physical Anesthesia Plan  ASA: II  Anesthesia Plan: MAC and Spinal   Post-op Pain Management: MAC Combined w/ Regional for Post-op pain   Induction: Intravenous  Airway Management Planned: Natural Airway, Nasal Cannula and Simple Face Mask  Additional Equipment: None  Intra-op Plan:   Post-operative Plan:   Informed Consent: I have reviewed the patients History and Physical, chart, labs and discussed the procedure including the risks, benefits and alternatives for the proposed anesthesia with the patient or authorized representative who has indicated his/her understanding and acceptance.   Dental advisory given  Plan Discussed with: CRNA and Surgeon  Anesthesia Plan Comments:         Anesthesia Quick Evaluation

## 2015-07-21 NOTE — Anesthesia Procedure Notes (Addendum)
Anesthesia Regional Block:  Adductor canal block  Pre-Anesthetic Checklist: ,, timeout performed, Correct Patient, Correct Site, Correct Laterality, Correct Procedure, Correct Position, site marked, Risks and benefits discussed,  Surgical consent,  Pre-op evaluation,  At surgeon's request and post-op pain management  Laterality: Lower and Right  Prep: chloraprep       Needles:  Injection technique: Single-shot  Needle Type: Echogenic Stimulator Needle          Additional Needles:  Procedures: ultrasound guided (picture in chart) Adductor canal block Narrative:  Injection made incrementally with aspirations every 5 mL.  Performed by: Personally  Anesthesiologist: MOSER, CHRISTOPHER  Additional Notes: H+P and labs reviewed, risks and benefits discussed with patient, procedure tolerated well without complications   Spinal Patient location during procedure: OR Staffing Anesthesiologist: MOSER, CHRISTOPHER Preanesthetic Checklist Completed: patient identified, surgical consent, pre-op evaluation, timeout performed, IV checked, risks and benefits discussed and monitors and equipment checked Spinal Block Patient position: sitting Prep: site prepped and draped and DuraPrep Patient monitoring: heart rate, cardiac monitor, continuous pulse ox and blood pressure Approach: midline Location: L3-4 Injection technique: single-shot Needle Needle type: Pencan  Needle gauge: 24 G Needle length: 10 cm Assessment Sensory level: T6  Procedure Name: MAC Date/Time: 07/21/2015 9:16 AM Performed by: Barrington Ellison Pre-anesthesia Checklist: Patient identified, Emergency Drugs available, Suction available, Patient being monitored and Timeout performed Patient Re-evaluated:Patient Re-evaluated prior to inductionOxygen Delivery Method: Simple face mask

## 2015-07-22 ENCOUNTER — Encounter (HOSPITAL_COMMUNITY): Payer: Self-pay | Admitting: Orthopedic Surgery

## 2015-07-22 LAB — CBC
HEMATOCRIT: 37.5 % — AB (ref 39.0–52.0)
Hemoglobin: 12.1 g/dL — ABNORMAL LOW (ref 13.0–17.0)
MCH: 30.2 pg (ref 26.0–34.0)
MCHC: 32.3 g/dL (ref 30.0–36.0)
MCV: 93.5 fL (ref 78.0–100.0)
PLATELETS: 175 10*3/uL (ref 150–400)
RBC: 4.01 MIL/uL — ABNORMAL LOW (ref 4.22–5.81)
RDW: 13.4 % (ref 11.5–15.5)
WBC: 12.8 10*3/uL — AB (ref 4.0–10.5)

## 2015-07-22 LAB — BASIC METABOLIC PANEL
ANION GAP: 8 (ref 5–15)
BUN: 16 mg/dL (ref 6–20)
CALCIUM: 8.8 mg/dL — AB (ref 8.9–10.3)
CO2: 27 mmol/L (ref 22–32)
CREATININE: 1.06 mg/dL (ref 0.61–1.24)
Chloride: 105 mmol/L (ref 101–111)
GFR calc Af Amer: >60 mL/min (ref >60)
GLUCOSE: 252 mg/dL — AB (ref 65–99)
Potassium: 4.7 mmol/L (ref 3.5–5.1)
Sodium: 140 mmol/L (ref 135–145)

## 2015-07-22 MED ORDER — MORPHINE SULFATE ER 60 MG PO TBCR
90.0000 mg | EXTENDED_RELEASE_TABLET | Freq: Two times a day (BID) | ORAL | Status: DC
  Administered 2015-07-22 – 2015-07-23 (×3): 90 mg via ORAL
  Filled 2015-07-22 (×3): qty 1

## 2015-07-22 NOTE — Progress Notes (Signed)
Subjective: 1 Day Post-Op Procedure(s) (LRB): RIGHT TOTAL KNEE ARTHROPLASTY (Right) Patient reports pain as 6 on 0-10 scale.    Objective: Vital signs in last 24 hours: Temp:  [97.5 F (36.4 C)-98 F (36.7 C)] 97.7 F (36.5 C) (12/20 0442) Pulse Rate:  [60-82] 66 (12/20 0442) Resp:  [9-18] 16 (12/20 0442) BP: (109-135)/(56-69) 129/68 mmHg (12/20 0442) SpO2:  [91 %-97 %] 96 % (12/20 0442)  Intake/Output from previous day: 12/19 0701 - 12/20 0700 In: 3110 [P.O.:240; I.V.:2815; IV Piggyback:55] Out: 3075 [Urine:3025; Blood:50] Intake/Output this shift:     Recent Labs  07/22/15 0645  HGB 12.1*    Recent Labs  07/22/15 0645  WBC 12.8*  RBC 4.01*  HCT 37.5*  PLT 175    Recent Labs  07/22/15 0645  NA 140  K 4.7  CL 105  CO2 27  BUN 16  CREATININE 1.06  GLUCOSE 252*  CALCIUM 8.8*   No results for input(s): LABPT, INR in the last 72 hours.  ABD soft Neurovascular intact Sensation intact distally Intact pulses distally Dorsiflexion/Plantar flexion intact Incision: dressing C/D/I  Assessment/Plan: 1 Day Post-Op Procedure(s) (LRB): RIGHT TOTAL KNEE ARTHROPLASTY (Right)  Principal Problem:   Primary localized osteoarthritis of right knee Active Problems:   Depression   Gastric ulcer   Arthritis   Hyperlipidemia   DJD (degenerative joint disease) of knee   Ex-smoker   Back pain   HTN (hypertension), benign   Prediabetes   Chronic pain syndrome  Advance diet Up with therapy  Ercell Perlman J 07/22/2015, 9:44 AM

## 2015-07-22 NOTE — Anesthesia Postprocedure Evaluation (Signed)
Anesthesia Post Note  Patient: Jim [name]  Procedure(s) Performed: Procedure(s) (LRB): RIGHT TOTAL KNEE ARTHROPLASTY (Right)  Patient location during evaluation: PACU Anesthesia Type: Regional, MAC and Spinal Level of consciousness: awake Pain management: pain level controlled Vital Signs Assessment: post-procedure vital signs reviewed and stable Respiratory status: spontaneous breathing Cardiovascular status: stable Postop Assessment: no signs of nausea or vomiting Anesthetic complications: no    Last Vitals:  Filed Vitals:   07/22/15 0047 07/22/15 0442  BP: 121/63 129/68  Pulse: 72 66  Temp: 36.4 C 36.5 C  Resp: 18 16    Last Pain:  Filed Vitals:   07/22/15 0525  PainSc: 5                  Yehia Mcbain

## 2015-07-22 NOTE — Progress Notes (Signed)
Physical Therapy Treatment Patient Details Name: Jim [name] MRN: [id number] DOB: 07-31-1956 Today's Date: 07/22/2015    History of Present Illness 59 y.o. male admitted to [facility] on 07/21/15 for elective R TKA.  Pt with significant PMHx of arthritis "everywhere" (back and neck sepcifically reported by pt), and L TKA.      PT Comments    Patient received stair training with safe execution of ascend/descend of 2 steps X 2 with overall mobility level of min guard/supervision for safety. Continue to progress as tolerated.    Follow Up Recommendations  Home health PT;Supervision for mobility/OOB     Equipment Recommendations  None recommended by PT    Recommendations for Other Services       Precautions / Restrictions Precautions Precautions: Knee Precaution Booklet Issued: Yes (comment) Precaution Comments: exercise handout given and precaution reviewed Required Braces or Orthoses: Knee Immobilizer - Right Knee Immobilizer - Right: Other (comment) (until d/c'd) Restrictions Weight Bearing Restrictions: Yes RLE Weight Bearing: Weight bearing as tolerated    Mobility  Bed Mobility Overal bed mobility: Needs Assistance Bed Mobility: Supine to Sit     Supine to sit: Supervision     General bed mobility comments: pt OOB in chair upon arrival  Transfers Overall transfer level: Needs assistance Equipment used: Rolling walker (2 wheeled) Transfers: Sit to/from Stand Sit to Stand: Supervision         General transfer comment: carry over of hand placement; supervision for safety  Ambulation/Gait Ambulation/Gait assistance: Supervision Ambulation Distance (Feet): 8 Feet (X2) Assistive device: Rolling walker (2 wheeled) Gait Pattern/deviations: Step-through pattern;Antalgic     General Gait Details: cues for upright posture    Stairs Stairs: Yes Stairs assistance: Min guard Stair Management: Two rails;Forwards Number of Stairs: 2 (X 2) General stair comments:  good safety awareness; cues for technique  Wheelchair Mobility    Modified Rankin (Stroke Patients Only)       Balance Overall balance assessment: Needs assistance Sitting-balance support: No upper extremity supported Sitting balance-Leahy Scale: Good     Standing balance support: During functional activity Standing balance-Leahy Scale: Fair Standing balance comment: Able to stand with no UE support to void and wash hands at sink                    Cognition Arousal/Alertness: Awake/alert Behavior During Therapy: WFL for tasks assessed/performed Overall Cognitive Status: Within Functional Limits for tasks assessed                      Exercises      General Comments        Pertinent Vitals/Pain Pain Assessment: 0-10 Pain Score: 6  Pain Location: R LE Pain Descriptors / Indicators: Aching Pain Intervention(s): Limited activity within patient's tolerance;Monitored during session;Premedicated before session;Ice applied    Home Living Family/patient expects to be discharged to:: Private residence Living Arrangements: Other relatives Available Help at Discharge: Family;Available 24 hours/day Type of Home: House Home Access: Stairs to enter Entrance Stairs-Rails: Right;Left Home Layout: One level Home Equipment: Environmental consultant - 2 wheels;Cane - single point;Bedside commode Additional Comments: Planning to go stay at ex-wife's house upon d/c    Prior Function Level of Independence: Independent with assistive device(s)      Comments: used cane for ambulation PTA   PT Goals (current goals can now be found in the care plan section) Acute Rehab PT Goals Patient Stated Goal: none stated PT Goal Formulation: With patient Time For Goal Achievement:  07/28/15 Progress towards PT goals: Progressing toward goals    Frequency  7X/week    PT Plan Current plan remains appropriate    Co-evaluation             End of Session Equipment Utilized During  Treatment: Gait belt Activity Tolerance: Patient limited by pain Patient left: in chair;with call bell/phone within reach     Time: 1534-1550 PT Time Calculation (min) (ACUTE ONLY): 16 min  Charges:  $Gait Training: 8-22 mins                    G Codes:      Salina April, PTA Pager: [phone]   07/22/2015, 4:17 PM

## 2015-07-22 NOTE — Evaluation (Signed)
Occupational Therapy Evaluation Patient Details Name: Jim [name] MRN: [id number] DOB: Jun 26, 1956 Today's Date: 07/22/2015    History of Present Illness 59 y.o. male admitted to [facility] on 07/21/15 for elective R TKA.  Pt with significant PMHx of arthritis "everywhere" (back and neck sepcifically reported by pt), and L TKA.     Clinical Impression   Pt reports he was independent with ADLs PTA. Currently pt is overall min guard for functional mobility and ADLs with the exception of min assist for LB ADLs. Began ADL and safety education with pt; he verbalized understanding. Pt planning to d/c home with 24/7 supervision from family. Pt would benefit from continued skilled OT in order to maximize independence and safety with LB ADLs, toilet, and tub/walk in shower transfers.     Follow Up Recommendations  No OT follow up;Supervision - Intermittent    Equipment Recommendations  None recommended by OT (pt reports he has all equipment he will need)    Recommendations for Other Services       Precautions / Restrictions Precautions Precautions: Knee Precaution Comments: Reviewed precautions and use of zero degree knee Required Braces or Orthoses: Knee Immobilizer - Right Knee Immobilizer - Right: Other (comment) (until d/c'd) Restrictions Weight Bearing Restrictions: Yes RLE Weight Bearing: Weight bearing as tolerated      Mobility Bed Mobility Overal bed mobility: Needs Assistance Bed Mobility: Supine to Sit     Supine to sit: Supervision        Transfers Overall transfer level: Needs assistance Equipment used: Rolling walker (2 wheeled) Transfers: Sit to/from Stand Sit to Stand: Min guard         General transfer comment: Min guard for safety; no physical assist required. Good hand placement and technique.    Balance Overall balance assessment: Needs assistance Sitting-balance support: Feet supported Sitting balance-Leahy Scale: Good     Standing balance  support: No upper extremity supported;During functional activity Standing balance-Leahy Scale: Fair Standing balance comment: Able to stand with no UE support to void and wash hands at sink                            ADL Overall ADL's : Needs assistance/impaired Eating/Feeding: Set up;Sitting   Grooming: Min guard;Wash/dry hands;Standing       Lower Body Bathing: Minimal assistance;Sit to/from stand       Lower Body Dressing: Minimal assistance;Sit to/from stand Lower Body Dressing Details (indicate cue type and reason): Pt able to reach bilateral feet but may have difficulty starting pants over RLE. Educated on compensatory strategies for LB ADLs; pt verbalized understanding. Pt reports ex-wife can assist with ADLs as needed. Toilet Transfer: Min guard;Ambulation;Comfort height toilet;RW Armed forces technical officer Details (indicate cue type and reason): Pt able to stand at toilet to void with min guard assist for balance. Toileting- Water quality scientist and Hygiene: Min guard;Sit to/from stand       Functional mobility during ADLs: Min guard;Rolling walker General ADL Comments: No family present for OT eval. Educated on home safety, ice for edema and pain, use of zero degree bone foam; pt verbalized understanding.     Vision     Perception     Praxis      Pertinent Vitals/Pain Pain Assessment: 0-10 Pain Score: 7  Pain Location: R LE Pain Descriptors / Indicators: Aching;Sore Pain Intervention(s): Limited activity within patient's tolerance;Monitored during session;Repositioned;Patient requesting pain meds-RN notified;Ice applied     Hand Dominance Right  Extremity/Trunk Assessment Upper Extremity Assessment Upper Extremity Assessment: Overall WFL for tasks assessed   Lower Extremity Assessment Lower Extremity Assessment: Defer to PT evaluation   Cervical / Trunk Assessment Cervical / Trunk Assessment: Other exceptions Cervical / Trunk Exceptions: h/o chronic  neck and back pain   Communication Communication Communication: HOH   Cognition Arousal/Alertness: Awake/alert Behavior During Therapy: WFL for tasks assessed/performed Overall Cognitive Status: Within Functional Limits for tasks assessed                     General Comments       Exercises       Shoulder Instructions      Home Living Family/patient expects to be discharged to:: Private residence Living Arrangements: Other relatives Available Help at Discharge: Family;Available 24 hours/day Type of Home: House Home Access: Stairs to enter [employer] of Steps: 5 Entrance Stairs-Rails: Right;Left Home Layout: One level     Bathroom Shower/Tub: Walk-in shower;Tub/shower unit   Bathroom Toilet: Standard Bathroom Accessibility: Yes How Accessible: Accessible via walker Home Equipment: [place] - 2 wheels;Cane - single point;Bedside commode   Additional Comments: Planning to go stay at ex-wife's house upon d/c      Prior Functioning/Environment Level of Independence: Independent with assistive device(s)        Comments: used cane for ambulation PTA    OT Diagnosis: Acute pain   OT Problem List: Impaired balance (sitting and/or standing);Decreased activity tolerance;Decreased knowledge of use of DME or AE;Decreased knowledge of precautions;Decreased safety awareness;Pain   OT Treatment/Interventions: Self-care/ADL training;DME and/or AE instruction;Patient/family education    OT Goals(Current goals can be found in the care plan section) Acute Rehab OT Goals Patient Stated Goal: to go home later this week OT Goal Formulation: With patient Time For Goal Achievement: 08/05/15 Potential to Achieve Goals: Good ADL Goals Pt Will Perform Lower Body Bathing: with supervision;sit to/from stand Pt Will Perform Lower Body Dressing: with supervision;sit to/from stand Pt Will Transfer to Toilet: with supervision;ambulating;bedside commode (over toilet) Pt  Will Perform Toileting - Clothing Manipulation and hygiene: with supervision;sit to/from stand Pt Will Perform Tub/Shower Transfer: with supervision;ambulating;3 in 1;rolling walker (tub or shower )  OT Frequency: Min 2X/week   Barriers to D/C:            Co-evaluation              End of Session Equipment Utilized During Treatment: Gait belt;Rolling walker;Right knee immobilizer CPM Right Knee CPM Right Knee: Off Right Knee Flexion (Degrees): 90 Right Knee Extension (Degrees): 0 Nurse Communication: Patient requests pain meds  Activity Tolerance: Patient tolerated treatment well Patient left: in chair;with call bell/phone within reach;Other (comment) (zero degree bone foam applied ro RLE)   Time: [id number] OT Time Calculation (min): 20 min Charges:  OT General Charges $OT Visit: 1 Procedure OT Evaluation $Initial OT Evaluation Tier I: 1 Procedure G-Codes:     Binnie Kand M.S., OTR/L Pager: [phone]  07/22/2015, 3:26 PM

## 2015-07-22 NOTE — Progress Notes (Signed)
Physical Therapy Treatment Patient Details Name: Jim [name] MRN: [id number] DOB: [birth year] Today's Date: 07/22/2015    History of Present Illness 59 y.o. male admitted to [facility] on 07/21/15 for elective R TKA.  Pt with significant PMHx of arthritis "everywhere" (back and neck sepcifically reported by pt), and L TKA.      PT Comments    Pt is progressing toward PT goals with overall mobility level of min guard/supervision and ability to ambulate 100 ft. Pt limited by pain this session but tolerated exercises well with extra time. Pt needs stair training next session. Continue to progress as tolerated.  Follow Up Recommendations  Home health PT;Supervision for mobility/OOB     Equipment Recommendations  None recommended by PT    Recommendations for Other Services       Precautions / Restrictions Precautions Precautions: Knee Precaution Booklet Issued: Yes (comment) Precaution Comments: exercise handout given and precaution reviewed Required Braces or Orthoses: Knee Immobilizer - Right Knee Immobilizer - Right: Other (comment) (until d/c'd) Restrictions Weight Bearing Restrictions: Yes RLE Weight Bearing: Weight bearing as tolerated    Mobility  Bed Mobility               General bed mobility comments: pt OOB in chair upon arrival  Transfers Overall transfer level: Needs assistance Equipment used: Rolling walker (2 wheeled) Transfers: Sit to/from Stand Sit to Stand: Supervision         General transfer comment: carry over of hand placement; cues for safe technique; supervision for safety  Ambulation/Gait Ambulation/Gait assistance: Min guard Ambulation Distance (Feet): 100 Feet Assistive device: Rolling walker (2 wheeled) Gait Pattern/deviations: Step-through pattern;Decreased stride length;Antalgic     General Gait Details: pt with improved with improved B step lengths after initial 20 ft; cues for safe use of AD when turning; tendency to pick RW up  instead of push   Stairs            Wheelchair Mobility    Modified Rankin (Stroke Patients Only)       Balance Overall balance assessment: Needs assistance Sitting-balance support: Feet supported Sitting balance-Leahy Scale: Good     Standing balance support: Bilateral upper extremity supported Standing balance-Leahy Scale: Fair                      Cognition Arousal/Alertness: Awake/alert Behavior During Therapy: WFL for tasks assessed/performed Overall Cognitive Status: Within Functional Limits for tasks assessed                      Exercises Total Joint Exercises Quad Sets: AROM;Right;10 reps Short Arc Quad: AROM;Right;5 reps Heel Slides: AROM;Right;5 reps Hip ABduction/ADduction: AROM;Right;10 reps Straight Leg Raises: AROM;AAROM;Right;5 reps Goniometric ROM: 2-55    General Comments        Pertinent Vitals/Pain Pain Assessment: 0-10 Pain Score: 8  Pain Location: R LE; especially medial and lateral knee Pain Descriptors / Indicators: Aching Pain Intervention(s): Limited activity within patient's tolerance;Monitored during session;Premedicated before session;Ice applied    Home Living                      Prior Function            PT Goals (current goals can now be found in the care plan section) Acute Rehab PT Goals Patient Stated Goal: none stated PT Goal Formulation: With patient Time For Goal Achievement: 07/28/15 Progress towards PT goals: Progressing toward goals    Frequency  7X/week    PT Plan Current plan remains appropriate    Co-evaluation             End of Session Equipment Utilized During Treatment: Gait belt Activity Tolerance: Patient limited by pain Patient left: in chair;with call bell/phone within reach     Time: 1040-1110 PT Time Calculation (min) (ACUTE ONLY): 30 min  Charges:  $Gait Training: 8-22 mins $Therapeutic Exercise: 8-22 mins                    G Codes:       Salina April, PTA Pager: [phone]   07/22/2015, 11:25 AM

## 2015-07-23 ENCOUNTER — Encounter (HOSPITAL_COMMUNITY): Payer: Self-pay | Admitting: General Practice

## 2015-07-23 LAB — BASIC METABOLIC PANEL
ANION GAP: 7 (ref 5–15)
BUN: 18 mg/dL (ref 6–20)
CALCIUM: 8.6 mg/dL — AB (ref 8.9–10.3)
CO2: 29 mmol/L (ref 22–32)
Chloride: 104 mmol/L (ref 101–111)
Creatinine, Ser: 0.91 mg/dL (ref 0.61–1.24)
Glucose, Bld: 279 mg/dL — ABNORMAL HIGH (ref 65–99)
Potassium: 4.8 mmol/L (ref 3.5–5.1)
Sodium: 140 mmol/L (ref 135–145)

## 2015-07-23 LAB — CBC
HEMATOCRIT: 37.1 % — AB (ref 39.0–52.0)
Hemoglobin: 11.7 g/dL — ABNORMAL LOW (ref 13.0–17.0)
MCH: 30.5 pg (ref 26.0–34.0)
MCHC: 31.5 g/dL (ref 30.0–36.0)
MCV: 96.6 fL (ref 78.0–100.0)
PLATELETS: 160 10*3/uL (ref 150–400)
RBC: 3.84 MIL/uL — ABNORMAL LOW (ref 4.22–5.81)
RDW: 13.8 % (ref 11.5–15.5)
WBC: 13.1 10*3/uL — AB (ref 4.0–10.5)

## 2015-07-23 LAB — GLUCOSE, CAPILLARY
GLUCOSE-CAPILLARY: 231 mg/dL — AB (ref 65–99)
Glucose-Capillary: 270 mg/dL — ABNORMAL HIGH (ref 65–99)
Glucose-Capillary: 237 mg/dL — ABNORMAL HIGH (ref 65–99)
Glucose-Capillary: 188 mg/dL — ABNORMAL HIGH (ref 65–99)

## 2015-07-23 MED ORDER — INSULIN GLARGINE 100 UNIT/ML ~~LOC~~ SOLN
10.0000 [IU] | Freq: Every day | SUBCUTANEOUS | Status: DC
  Administered 2015-07-23: 10 [IU] via SUBCUTANEOUS
  Filled 2015-07-23 (×3): qty 0.1

## 2015-07-23 MED ORDER — INSULIN ASPART 100 UNIT/ML ~~LOC~~ SOLN
0.0000 [IU] | Freq: Every day | SUBCUTANEOUS | Status: DC
  Administered 2015-07-23: 2 [IU] via SUBCUTANEOUS

## 2015-07-23 MED ORDER — INSULIN ASPART 100 UNIT/ML ~~LOC~~ SOLN
4.0000 [IU] | Freq: Three times a day (TID) | SUBCUTANEOUS | Status: DC
  Administered 2015-07-23 – 2015-07-25 (×7): 4 [IU] via SUBCUTANEOUS

## 2015-07-23 MED ORDER — INSULIN ASPART 100 UNIT/ML ~~LOC~~ SOLN
0.0000 [IU] | Freq: Three times a day (TID) | SUBCUTANEOUS | Status: DC
  Administered 2015-07-23: 8 [IU] via SUBCUTANEOUS
  Administered 2015-07-23: 3 [IU] via SUBCUTANEOUS
  Administered 2015-07-23: 5 [IU] via SUBCUTANEOUS

## 2015-07-23 NOTE — Progress Notes (Signed)
Occupational Therapy Treatment Patient Details Name: Jim [name] MRN: [id number] DOB: 10/22/1955 Today's Date: 07/23/2015    History of present illness 59 y.o. male admitted to [facility] on 07/21/15 for elective R TKA.  Pt with significant PMHx of arthritis "everywhere" (back and neck sepcifically reported by pt), and L TKA.     OT comments  Pt progressing quickly toward OT goals. Educated on walk in shower transfer technique; pt able to return demo. Pt currently at an overall supervision level for ADLs and functional mobility but still requiring min assist for LB ADLs. Will continue to follow acutely.    Follow Up Recommendations  No OT follow up;Supervision - Intermittent    Equipment Recommendations  None recommended by OT    Recommendations for Other Services      Precautions / Restrictions Precautions Precautions: Knee Precaution Comments: Reviewed precautions and use of zero degree knee Required Braces or Orthoses: Knee Immobilizer - Right Restrictions Weight Bearing Restrictions: Yes RLE Weight Bearing: Weight bearing as tolerated       Mobility Bed Mobility Overal bed mobility: Modified Independent Bed Mobility: Supine to Sit     Supine to sit: Modified independent (Device/Increase time)        Transfers Overall transfer level: Needs assistance Equipment used: Rolling walker (2 wheeled) Transfers: Sit to/from Stand Sit to Stand: Supervision         General transfer comment: Supervision for safety. Good hand placement and technique. Sit to stand from EOB x 1, BSC x 1    Balance Overall balance assessment: Needs assistance Sitting-balance support: Feet supported Sitting balance-Leahy Scale: Good     Standing balance support: During functional activity Standing balance-Leahy Scale: Fair                     ADL Overall ADL's : Needs assistance/impaired     Grooming: Supervision/safety;Wash/dry hands;Standing               Lower Body  Dressing: Minimal assistance;Sit to/from stand Lower Body Dressing Details (indicate cue type and reason): Min assist for managing clothing on RLE Toilet Transfer: Supervision/safety;Ambulation;BSC;RW (BSC over toilet)       Tub/ Shower Transfer: Supervision/safety;Walk-in shower;Ambulation;Rolling walker Tub/Shower Transfer Details (indicate cue type and reason): Educated on walk in shower transfer technique; pt able to return demo. Educated on need for supervision with shower transfers upon return home. Functional mobility during ADLs: Supervision/safety;Rolling walker General ADL Comments: No family present for OT eval. Reviewed use of ice for edema and pain and use of zero degree knee      Vision                     Perception     Praxis      Cognition   Behavior During Therapy: WFL for tasks assessed/performed Overall Cognitive Status: Within Functional Limits for tasks assessed                       Extremity/Trunk Assessment               Exercises     Shoulder Instructions       General Comments      Pertinent Vitals/ Pain       Pain Assessment: 0-10 Pain Score: 6  Pain Location: R knee Pain Descriptors / Indicators: Aching Pain Intervention(s): Monitored during session;Repositioned;Ice applied  Home Living  Prior Functioning/Environment              Frequency Min 2X/week     Progress Toward Goals  OT Goals(current goals can now be found in the care plan section)  Progress towards OT goals: Progressing toward goals  Acute Rehab OT Goals Patient Stated Goal: to go home on friday   Plan Discharge plan remains appropriate    Co-evaluation                 End of Session Equipment Utilized During Treatment: Rolling walker   Activity Tolerance Patient tolerated treatment well   Patient Left in chair;with call bell/phone within reach;Other (comment) (zero  degree applied to RLE)   Nurse Communication          Time: [id number] OT Time Calculation (min): 16 min  Charges: OT General Charges $OT Visit: 1 Procedure OT Treatments $Self Care/Home Management : 8-22 mins  Binnie Kand M.S., OTR/L Pager: [phone]  07/23/2015, 3:35 PM

## 2015-07-23 NOTE — Progress Notes (Signed)
Physical Therapy Treatment Patient Details Name: Jim [name] MRN: [id number] DOB: Apr 24, 1956 Today's Date: 07/23/2015    History of Present Illness 59 y.o. male admitted to [facility] on 07/21/15 for elective R TKA.  Pt with significant PMHx of arthritis "everywhere" (back and neck sepcifically reported by pt), and L TKA.      PT Comments    Patient is progressing toward mobility goals with ability to ambulate 140 ft. Overall mobility level of supervision/mod I. Pt tolerated exercises well and with improved gait mechanics this session. Continue to progress as tolerated.  Follow Up Recommendations  Home health PT;Supervision for mobility/OOB     Equipment Recommendations  None recommended by PT    Recommendations for Other Services       Precautions / Restrictions Precautions Precautions: Knee Precaution Booklet Issued: Yes (comment) Precaution Comments: exercise handout given and precaution reviewed Required Braces or Orthoses: Knee Immobilizer - Right Knee Immobilizer - Right: Other (comment) (until d/c'd) Restrictions Weight Bearing Restrictions: Yes RLE Weight Bearing: Weight bearing as tolerated    Mobility  Bed Mobility Overal bed mobility: Modified Independent Bed Mobility: Supine to Sit     Supine to sit: Modified independent (Device/Increase time)     General bed mobility comments: HOB flat without use of bed rails; extra time needed  Transfers Overall transfer level: Needs assistance Equipment used: Rolling walker (2 wheeled) Transfers: Sit to/from Stand Sit to Stand: Supervision         General transfer comment: carry over of hand placement and technique; supervision for safety  Ambulation/Gait Ambulation/Gait assistance: Supervision Ambulation Distance (Feet): 140 Feet Assistive device: Rolling walker (2 wheeled) Gait Pattern/deviations: Step-through pattern;Antalgic     General Gait Details: heavy use of Bilat UE; improved cadence and WS to R LE  but still antalgic gait; cues for upright posture and increased R knee flexion   Stairs   Stairs assistance: Min guard     General stair comments: good safety awareness; cues for technique  Wheelchair Mobility    Modified Rankin (Stroke Patients Only)       Balance Overall balance assessment: Needs assistance Sitting-balance support: No upper extremity supported Sitting balance-Leahy Scale: Good     Standing balance support: During functional activity Standing balance-Leahy Scale: Fair                      Cognition Arousal/Alertness: Awake/alert Behavior During Therapy: WFL for tasks assessed/performed Overall Cognitive Status: Within Functional Limits for tasks assessed                      Exercises Total Joint Exercises Ankle Circles/Pumps: AROM;Both;10 reps Quad Sets: AROM;Right;10 reps Heel Slides: AROM;Right;10 reps Hip ABduction/ADduction: AROM;Right;10 reps Straight Leg Raises: AROM;Right;5 reps Goniometric ROM: 0-75    General Comments        Pertinent Vitals/Pain Pain Assessment: Faces Faces Pain Scale: Hurts even more Pain Location: R knee Pain Descriptors / Indicators: Aching;Sore    Home Living                      Prior Function            PT Goals (current goals can now be found in the care plan section) Acute Rehab PT Goals Patient Stated Goal: none stated PT Goal Formulation: With patient Time For Goal Achievement: 07/28/15 Progress towards PT goals: Progressing toward goals    Frequency  7X/week    PT Plan Current plan  remains appropriate    Co-evaluation             End of Session Equipment Utilized During Treatment: Gait belt Activity Tolerance: Patient limited by pain Patient left: in chair;with call bell/phone within reach     Time: 0923-0957 PT Time Calculation (min) (ACUTE ONLY): 34 min  Charges:  $Gait Training: 8-22 mins $Therapeutic Exercise: 8-22 mins                    G  Codes:      Salina April, PTA Pager: [phone]   07/23/2015, 10:04 AM

## 2015-07-23 NOTE — Progress Notes (Signed)
Orthopedic Tech Progress Note Patient Details:  Jim [name] 1956/02/15 [id number] Patient on cpm at 1915 Patient ID: Jim [name], male   DOB: 1956-07-03, 59 y.o.   MRN: [id number]   Jim [name] 07/23/2015, 7:15 PM

## 2015-07-23 NOTE — Progress Notes (Signed)
Inpatient Diabetes Program Recommendations  AACE/ADA: New Consensus Statement on Inpatient Glycemic Control (2015)  Target Ranges:  Prepandial:   less than 140 mg/dL      Peak postprandial:   less than 180 mg/dL (1-2 hours)      Critically ill patients:  140 - 180 mg/dL   Review of Glycemic Control  Diabetes history: None Current orders for Inpatient glycemic control: Lantus 10 units QHS, Novolog Moderate + HS + Novolog 4 units TID meal coverage  Inpatient Diabetes Program Recommendations: Insulin - Basal: Basal insulin ordered to be given tonight, please adjust time to this am.  Thanks,  Tama Headings RN, MSN, [facility] Inpatient Diabetes Coordinator Team Pager [phone] (8a-5p)

## 2015-07-23 NOTE — Progress Notes (Signed)
Physical Therapy Treatment Patient Details Name: Jim [name] MRN: [id number] DOB: 1956-06-21 Today's Date: 07/23/2015    History of Present Illness 59 y.o. male admitted to [facility] on 07/21/15 for elective R TKA.  Pt with significant PMHx of arthritis "everywhere" (back and neck sepcifically reported by pt), and L TKA.      PT Comments    Patient continues to do well with PT with ability to ambulate 200 ft with improved gait mechanics and increased cadence. Overall mobility level of supervision. Educated pt on knee flexion stretch and encouraged compliance of HEP. Continue to progress as tolerated.  Follow Up Recommendations  Home health PT;Supervision for mobility/OOB     Equipment Recommendations  None recommended by PT    Recommendations for Other Services       Precautions / Restrictions Precautions Precautions: Knee Precaution Booklet Issued: Yes (comment) Precaution Comments: exercise handout given and precaution reviewed Required Braces or Orthoses: Knee Immobilizer - Right Knee Immobilizer - Right: Other (comment) (until d/c'd) Restrictions Weight Bearing Restrictions: Yes RLE Weight Bearing: Weight bearing as tolerated    Mobility  Bed Mobility Overal bed mobility: Modified Independent Bed Mobility: Supine to Sit     Supine to sit: Modified independent (Device/Increase time)        Transfers Overall transfer level: Needs assistance Equipment used: Rolling walker (2 wheeled) Transfers: Sit to/from Stand Sit to Stand: Supervision         General transfer comment: safe hand placement and technique  Ambulation/Gait Ambulation/Gait assistance: Supervision Ambulation Distance (Feet): 200 Feet Assistive device: Rolling walker (2 wheeled) Gait Pattern/deviations: Step-through pattern;Antalgic;Decreased stance time - right     General Gait Details: heavy use of Bilat UE; tendendy to keep R knee in slight flexion; vc for engaging R quad before WS to R LE     Stairs            Wheelchair Mobility    Modified Rankin (Stroke Patients Only)       Balance Overall balance assessment: Needs assistance Sitting-balance support: Feet supported Sitting balance-Leahy Scale: Good     Standing balance support: During functional activity Standing balance-Leahy Scale: Fair                      Cognition Arousal/Alertness: Awake/alert Behavior During Therapy: WFL for tasks assessed/performed Overall Cognitive Status: Within Functional Limits for tasks assessed                      Exercises Total Joint Exercises Knee Flexion: AROM;Right;10 reps;Seated (L LE assisted for flexion stretch)    General Comments        Pertinent Vitals/Pain Pain Assessment: 0-10 Pain Score: 7  Pain Location: R knee  Pain Descriptors / Indicators: Aching Pain Intervention(s): Limited activity within patient's tolerance;Monitored during session;Premedicated before session;Ice applied    Home Living                      Prior Function            PT Goals (current goals can now be found in the care plan section) Acute Rehab PT Goals Patient Stated Goal: none stated PT Goal Formulation: With patient Time For Goal Achievement: 07/28/15 Progress towards PT goals: Progressing toward goals    Frequency  7X/week    PT Plan Current plan remains appropriate    Co-evaluation             End of Session  Equipment Utilized During Treatment: Gait belt Activity Tolerance: Patient limited by pain Patient left: in chair;with call bell/phone within reach     Time: 1600-1625 PT Time Calculation (min) (ACUTE ONLY): 25 min  Charges:  $Gait Training: 23-37 mins                    G Codes:      Salina April, PTA Pager: [phone]   07/23/2015, 4:32 PM

## 2015-07-24 LAB — GLUCOSE, CAPILLARY
GLUCOSE-CAPILLARY: 119 mg/dL — AB (ref 65–99)
Glucose-Capillary: 152 mg/dL — ABNORMAL HIGH (ref 65–99)
Glucose-Capillary: 95 mg/dL (ref 65–99)
Glucose-Capillary: 110 mg/dL — ABNORMAL HIGH (ref 65–99)

## 2015-07-24 LAB — BASIC METABOLIC PANEL
ANION GAP: 7 (ref 5–15)
BUN: 16 mg/dL (ref 6–20)
CALCIUM: 8.8 mg/dL — AB (ref 8.9–10.3)
CO2: 30 mmol/L (ref 22–32)
Chloride: 104 mmol/L (ref 101–111)
Creatinine, Ser: 0.8 mg/dL (ref 0.61–1.24)
GFR calc Af Amer: >60 mL/min (ref >60)
GFR calc non Af Amer: >60 mL/min (ref >60)
GLUCOSE: 161 mg/dL — AB (ref 65–99)
Potassium: 4.9 mmol/L (ref 3.5–5.1)
Sodium: 141 mmol/L (ref 135–145)

## 2015-07-24 LAB — CBC
HEMATOCRIT: 37.5 % — AB (ref 39.0–52.0)
Hemoglobin: 11.8 g/dL — ABNORMAL LOW (ref 13.0–17.0)
MCH: 30.1 pg (ref 26.0–34.0)
MCHC: 31.5 g/dL (ref 30.0–36.0)
MCV: 95.7 fL (ref 78.0–100.0)
PLATELETS: 179 10*3/uL (ref 150–400)
RBC: 3.92 MIL/uL — ABNORMAL LOW (ref 4.22–5.81)
RDW: 14 % (ref 11.5–15.5)
WBC: 13.4 10*3/uL — AB (ref 4.0–10.5)

## 2015-07-24 LAB — HEMOGLOBIN A1C
Hgb A1c MFr Bld: 6.3 % — ABNORMAL HIGH (ref 4.8–5.6)
Mean Plasma Glucose: 134 mg/dL

## 2015-07-24 MED ORDER — MORPHINE SULFATE ER 60 MG PO TBCR
60.0000 mg | EXTENDED_RELEASE_TABLET | Freq: Two times a day (BID) | ORAL | Status: DC
  Administered 2015-07-24 – 2015-07-25 (×3): 60 mg via ORAL
  Filled 2015-07-24 (×3): qty 1

## 2015-07-24 NOTE — Progress Notes (Signed)
Physical Therapy Treatment Patient Details Name: Jim [name] MRN: [id number] DOB: 1955/08/17 Today's Date: 07/24/2015    History of Present Illness 59 y.o. male admitted to [facility] on 07/21/15 for elective R TKA.  Pt with significant PMHx of arthritis "everywhere" (back and neck sepcifically reported by pt), and L TKA.      PT Comments    Patient received stair training with straight cane and demonstrated ability to ascend/descend 5 steps with min guard for safety. Pt reported that he will have assistance from his sister when d/c home. Continue to progress as tolerated with anticipated d/c home with home health PT.  Follow Up Recommendations  Home health PT;Supervision for mobility/OOB     Equipment Recommendations  None recommended by PT    Recommendations for Other Services       Precautions / Restrictions Precautions Precautions: Knee Precaution Booklet Issued: Yes (comment) Precaution Comments: exercise handout given and precaution reviewed Required Braces or Orthoses: Knee Immobilizer - Right Knee Immobilizer - Right: Other (comment) (until d/c'd) Restrictions Weight Bearing Restrictions: Yes RLE Weight Bearing: Weight bearing as tolerated    Mobility  Bed Mobility               General bed mobility comments: OOB in chair upon arrival  Transfers Overall transfer level: Needs assistance Equipment used: Rolling walker (2 wheeled) Transfers: Sit to/from Stand (from EOB and recliner chair) Sit to Stand: Modified independent (Device/Increase time)         General transfer comment: pt with slight LOB upon first sit to stand from EOB with ability to regain balance with use of bed rail and straight cane; pt instructed that although he is progressing very well and feeling better he still needs to take this slowly and be as safe as possible; carry over of hand placement and technique  Ambulation/Gait Ambulation/Gait assistance: Supervision Ambulation Distance  (Feet): 150 Feet Assistive device: Straight cane Gait Pattern/deviations: Step-through pattern;Antalgic;Decreased stride length     General Gait Details: vc for increased L step length and increased WS to R LE; pt with good sequencing of gait with use of AD    Stairs   Stairs assistance: Min guard Stair Management: One rail Right;Sideways;With cane Number of Stairs: 5 General stair comments: vc for technique and sequencing  Wheelchair Mobility    Modified Rankin (Stroke Patients Only)       Balance Overall balance assessment: Needs assistance Sitting-balance support: Feet supported Sitting balance-Leahy Scale: Good     Standing balance support: During functional activity Standing balance-Leahy Scale: Fair                      Cognition Arousal/Alertness: Awake/alert Behavior During Therapy: WFL for tasks assessed/performed Overall Cognitive Status: Within Functional Limits for tasks assessed                      Exercises Total Joint Exercises Quad Sets: AROM;Right;10 reps Heel Slides: AROM;Right;10 reps Hip ABduction/ADduction: AROM;Right;10 reps Straight Leg Raises: AROM;Right;5 reps Long Arc Quad: AROM;Right;10 reps;Seated Knee Flexion: AROM;Right;15 reps;Seated Goniometric ROM: 0-85    General Comments        Pertinent Vitals/Pain Pain Assessment: Faces Faces Pain Scale: Hurts even more (with knee flexion) Pain Location: R knee Pain Descriptors / Indicators: Sore Pain Intervention(s): Limited activity within patient's tolerance;Premedicated before session;Monitored during session    Home Living  Prior Function            PT Goals (current goals can now be found in the care plan section) Acute Rehab PT Goals Patient Stated Goal: none stated PT Goal Formulation: With patient Time For Goal Achievement: 07/28/15 Progress towards PT goals: Progressing toward goals    Frequency  7X/week    PT Plan  Current plan remains appropriate    Co-evaluation             End of Session Equipment Utilized During Treatment: Gait belt Activity Tolerance: Patient limited by pain Patient left: in chair;with call bell/phone within reach     Time: [id number] PT Time Calculation (min) (ACUTE ONLY): 14 min  Charges:  $Gait Training: 8-22 mins                    G Codes:      Salina April, PTA Pager: [phone]   07/24/2015, 2:37 PM

## 2015-07-24 NOTE — Progress Notes (Signed)
Subjective: 3 Days Post-Op Procedure(s) (LRB): RIGHT TOTAL KNEE ARTHROPLASTY (Right) Patient reports pain as 9 on 0-10 scale.    Objective: Vital signs in last 24 hours: Temp:  [97.8 F (36.6 C)-98.5 F (36.9 C)] 98.3 F (36.8 C) (12/22 0647) Pulse Rate:  [60-69] 60 (12/22 0647) Resp:  [18] 18 (12/22 0647) BP: (134-156)/(71-87) 134/71 mmHg (12/22 0647) SpO2:  [93 %-99 %] 99 % (12/22 0647)  Intake/Output from previous day: 12/21 0701 - 12/22 0700 In: 2880 [P.O.:2880] Out: 2100 [Urine:2100] Intake/Output this shift:     Recent Labs  07/22/15 0645 07/23/15 0540 07/24/15 0622  HGB 12.1* 11.7* 11.8*    Recent Labs  07/23/15 0540 07/24/15 0622  WBC 13.1* 13.4*  RBC 3.84* 3.92*  HCT 37.1* 37.5*  PLT 160 179    Recent Labs  07/23/15 0540 07/24/15 0622  NA 140 141  K 4.8 4.9  CL 104 104  CO2 29 30  BUN 18 16  CREATININE 0.91 0.80  GLUCOSE 279* 161*  CALCIUM 8.6* 8.8*   No results for input(s): LABPT, INR in the last 72 hours.  ABD soft Neurovascular intact Sensation intact distally Intact pulses distally Incision: dressing C/D/I  Assessment/Plan: 3 Days Post-Op Procedure(s) (LRB): RIGHT TOTAL KNEE ARTHROPLASTY (Right)  Principal Problem:   Primary localized osteoarthritis of right knee Active Problems:   Depression   Gastric ulcer   Arthritis   Hyperlipidemia   DJD (degenerative joint disease) of knee   Ex-smoker   Back pain   HTN (hypertension), benign   Prediabetes   Chronic pain syndrome  Advance diet Up with therapy Plan for discharge tomorrow  Will discontinue IV dilaudid today in hopes of controlling his pain on PO pain medication. Plan to start outpatient physical therapy on 07-29-2015.  Patient has an appt at [facility] outpatient therapy already scheduled.  Krisy Dix J 07/24/2015, 8:08 AM

## 2015-07-24 NOTE — Progress Notes (Signed)
Physical Therapy Treatment Patient Details Name: DERONTA LUEPKE MRN: [id number] DOB: 12-31-1955 Today's Date: 07/24/2015    History of Present Illness 59 y.o. male admitted to [facility] on 07/21/15 for elective R TKA.  Pt with significant PMHx of arthritis "everywhere" (back and neck sepcifically reported by pt), and L TKA.      PT Comments    Patient continues to progress toward mobility goals with overall mobility level of supervision/mod I and ability ambulate 250 ft with improved gait mechanics. Pt tolerated exercises well with improved ROM. Stair training with straight cane next session.   Follow Up Recommendations  Home health PT;Supervision for mobility/OOB     Equipment Recommendations  None recommended by PT    Recommendations for Other Services       Precautions / Restrictions Precautions Precautions: Knee Precaution Booklet Issued: Yes (comment) Precaution Comments: exercise handout given and precaution reviewed Required Braces or Orthoses: Knee Immobilizer - Right Knee Immobilizer - Right: Other (comment) (until d/c'd) Restrictions Weight Bearing Restrictions: Yes RLE Weight Bearing: Weight bearing as tolerated    Mobility  Bed Mobility               General bed mobility comments: OOB in chair upon arrival  Transfers Overall transfer level: Needs assistance Equipment used: Rolling walker (2 wheeled) Transfers: Sit to/from Stand Sit to Stand: Modified independent (Device/Increase time)         General transfer comment: carry over of hand placement and technique  Ambulation/Gait Ambulation/Gait assistance: Supervision Ambulation Distance (Feet): 250 Feet Assistive device: Straight cane Gait Pattern/deviations: Step-through pattern;Antalgic;Decreased stride length     General Gait Details: pt with improved gait mechanics and with increased R knee extension before WS onto R LE; vc for 2 point gait pattern    Stairs            Wheelchair  Mobility    Modified Rankin (Stroke Patients Only)       Balance Overall balance assessment: Needs assistance Sitting-balance support: Feet supported Sitting balance-Leahy Scale: Good     Standing balance support: During functional activity Standing balance-Leahy Scale: Fair                      Cognition Arousal/Alertness: Awake/alert Behavior During Therapy: WFL for tasks assessed/performed Overall Cognitive Status: Within Functional Limits for tasks assessed                      Exercises Total Joint Exercises Quad Sets: AROM;Right;10 reps Heel Slides: AROM;Right;10 reps Hip ABduction/ADduction: AROM;Right;10 reps Straight Leg Raises: AROM;Right;5 reps Knee Flexion:  (L LE assisted for flexion stretch) Goniometric ROM: 0-85    General Comments        Pertinent Vitals/Pain Pain Assessment: Faces Faces Pain Scale: Hurts even more Pain Location: R knee Pain Descriptors / Indicators: Sore Pain Intervention(s): Limited activity within patient's tolerance;Monitored during session;Premedicated before session    Home Living                      Prior Function            PT Goals (current goals can now be found in the care plan section) Acute Rehab PT Goals Patient Stated Goal: none stated PT Goal Formulation: With patient Time For Goal Achievement: 07/28/15 Progress towards PT goals: Progressing toward goals    Frequency  7X/week    PT Plan Current plan remains appropriate    Co-evaluation  End of Session Equipment Utilized During Treatment: Gait belt Activity Tolerance: Patient limited by pain Patient left: in chair;with call bell/phone within reach     Time: [id number] PT Time Calculation (min) (ACUTE ONLY): 19 min  Charges:  $Gait Training: 8-22 mins                    G Codes:      Salina April, PTA Pager: [phone]   07/24/2015, 1:36 PM

## 2015-07-25 LAB — BASIC METABOLIC PANEL
ANION GAP: 8 (ref 5–15)
BUN: 20 mg/dL (ref 6–20)
CALCIUM: 8.4 mg/dL — AB (ref 8.9–10.3)
CHLORIDE: 102 mmol/L (ref 101–111)
CO2: 31 mmol/L (ref 22–32)
CREATININE: 1.03 mg/dL (ref 0.61–1.24)
GFR calc non Af Amer: >60 mL/min (ref >60)
Glucose, Bld: 98 mg/dL (ref 65–99)
Potassium: 4.1 mmol/L (ref 3.5–5.1)
SODIUM: 141 mmol/L (ref 135–145)

## 2015-07-25 LAB — CBC
HEMATOCRIT: 37.7 % — AB (ref 39.0–52.0)
HEMOGLOBIN: 11.9 g/dL — AB (ref 13.0–17.0)
MCH: 30.1 pg (ref 26.0–34.0)
MCHC: 31.6 g/dL (ref 30.0–36.0)
MCV: 95.2 fL (ref 78.0–100.0)
Platelets: 163 10*3/uL (ref 150–400)
RBC: 3.96 MIL/uL — ABNORMAL LOW (ref 4.22–5.81)
RDW: 13.8 % (ref 11.5–15.5)
WBC: 10.2 10*3/uL (ref 4.0–10.5)

## 2015-07-25 LAB — GLUCOSE, CAPILLARY: GLUCOSE-CAPILLARY: 91 mg/dL (ref 65–99)

## 2015-07-25 MED ORDER — POLYETHYLENE GLYCOL 3350 17 G PO PACK: PACK | ORAL | Status: DC

## 2015-07-25 MED ORDER — ACETAMINOPHEN 325 MG PO TABS: 650.0000 mg | ORAL_TABLET | Freq: Four times a day (QID) | ORAL | Status: DC | PRN

## 2015-07-25 MED ORDER — ASPIRIN 325 MG PO TBEC: DELAYED_RELEASE_TABLET | ORAL | Status: DC

## 2015-07-25 MED ORDER — OXYCODONE HCL 15 MG PO TABS: ORAL_TABLET | ORAL | Status: DC

## 2015-07-25 MED ORDER — DOCUSATE SODIUM 100 MG PO CAPS: ORAL_CAPSULE | ORAL | Status: DC

## 2015-07-25 MED ORDER — MORPHINE SULFATE ER 30 MG PO TBCR: EXTENDED_RELEASE_TABLET | ORAL | Status: DC

## 2015-07-25 NOTE — Discharge Summary (Signed)
Patient ID: Jim [name] MRN: [id number] DOB/AGE: 60/23/1957 60 y.o.  Admit date: 07/21/2015 Discharge date: 07/25/2015  Admission Diagnoses:  Principal Problem:   Primary localized osteoarthritis of right knee Active Problems:   Depression   Gastric ulcer   Arthritis   Hyperlipidemia   DJD (degenerative joint disease) of knee   Ex-smoker   Back pain   HTN (hypertension), benign   Prediabetes   Chronic pain syndrome   Discharge Diagnoses:  Same  Past Medical History  Diagnosis Date  . Depression   . Gastric ulcer   . Arthritis   . Hyperlipidemia   . Bursitis of hip     Bilateral  . 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 Ex-smoker     Quit 11/07/2011  . Back pain   . History of colon polyps   . Primary localized osteoarthritis of right knee     Surgeries: Procedure(s): RIGHT TOTAL KNEE ARTHROPLASTY on 07/21/2015   Consultants:    Discharged Condition: Improved  Hospital Course: Jim [name] is an 59 y.o. male who was admitted 07/21/2015 for operative treatment ofPrimary localized osteoarthritis of right knee. Patient has severe unremitting pain that affects sleep, daily activities, and work/hobbies. After pre-op clearance the patient was taken to the operating room on 07/21/2015 and underwent  Procedure(s): RIGHT TOTAL KNEE ARTHROPLASTY.    Patient was given perioperative antibiotics: Anti-infectives    Start     Dose/Rate Route Frequency Ordered Stop   07/21/15 1500  ceFAZolin (ANCEF) IVPB 2 g/50 mL premix     2 g 100 mL/hr over 30 Minutes Intravenous Every 6 hours 07/21/15 1357 07/21/15 2214   07/21/15 1042  cefUROXime (ZINACEF) injection  Status:  Discontinued       As needed 07/21/15 1043 07/21/15 1143   07/21/15 0600  ceFAZolin (ANCEF) IVPB 2 g/50 mL premix     2 g 100 mL/hr over 30 Minutes Intravenous To ShortStay Surgical 07/20/15 1525 07/21/15 0916       Patient was given sequential compression devices, early  ambulation, and chemoprophylaxis to prevent DVT.  Patient benefited maximally from hospital stay and there were no complications.    Recent vital signs: Patient Vitals for the past 24 hrs:  BP Temp Temp src Pulse Resp SpO2  07/25/15 0442 (!) 170/81 mmHg 98.2 F (36.8 C) Oral 66 18 99 %  07/24/15 2057 (!) 141/69 mmHg 98 F (36.7 C) Oral 64 18 98 %  07/24/15 1535 (!) 148/86 mmHg 97.7 F (36.5 C) Oral 63 18 95 %     Recent laboratory studies:  Recent Labs  07/24/15 0622 07/25/15 0535  WBC 13.4* 10.2  HGB 11.8* 11.9*  HCT 37.5* 37.7*  PLT 179 163  NA 141 141  K 4.9 4.1  CL 104 102  CO2 30 31  BUN 16 20  CREATININE 0.80 1.03  GLUCOSE 161* 98  CALCIUM 8.8* 8.4*     Discharge Medications:     Medication List    STOP taking these medications        bisacodyl 5 MG EC tablet  Commonly known as:  DULCOLAX      TAKE these medications        acetaminophen 325 MG tablet  Commonly known as:  TYLENOL  Take 2 tablets (650 mg total) by mouth every 6 (six) hours as needed for mild pain (or Fever >/= 101).     albuterol 108 (90 BASE) MCG/ACT  inhaler  Commonly known as:  PROVENTIL HFA;VENTOLIN HFA  Inhale 2 puffs into the lungs every 6 (six) hours as needed for wheezing.     aspirin 325 MG EC tablet  1 tablet twice a day for 30 days to prevent blood clots     citalopram 20 MG tablet  Commonly known as:  CELEXA  Take 1 tablet (20 mg total) by mouth daily.     docusate sodium 100 MG capsule  Commonly known as:  COLACE  1 tab 2 times a day while on narcotics.  STOOL SOFTENER     meloxicam 15 MG tablet  Commonly known as:  MOBIC  Take 1 tablet (15 mg total) by mouth daily. with food     morphine 30 MG 12 hr tablet  Commonly known as:  MS CONTIN  Take 2 tablets twice a day for 7 days then 1 tablet twice a day for 7 days then stop.  LONG ACTING PAIN MEDCATION     multivitamin with minerals Tabs tablet  Take 1 tablet by mouth daily.     oxyCODONE 15 MG immediate  release tablet  Commonly known as:  ROXICODONE  1 tablet every 3 hrs for break through pain.  DO NOT TAKE AT THE SAME TIME AS MSCONTIN.   MAX OF 6 PILLS A DAY. THIS SCRIPT MUST LAST UNTIL APPT AT [facility] ON 08/13/2015     polyethylene glycol packet  Commonly known as:  MIRALAX / GLYCOLAX  17grams in 16 oz of water twice a day until bowel movement.  LAXITIVE.  Restart if two days since last bowel movement     tiZANidine 4 MG tablet  Commonly known as:  ZANAFLEX  Take 4 mg by mouth every 8 (eight) hours.        Diagnostic Studies: Dg Chest 2 View  07/11/2015  CLINICAL DATA:  Preoperative exam prior to right total knee arthroplasty. History of COPD ; previous smoker discontinued in April 2013. EXAM: CHEST  2 VIEW COMPARISON:  PA and lateral chest x-ray of August 29, 2012 FINDINGS: The lungs are adequately inflated. There is no focal infiltrate. The interstitial markings are coarse bilaterally. Nipple shadows are visible bilaterally. The heart and pulmonary vascularity are normal. The mediastinum is normal in width. The trachea is midline. There is mild multilevel degenerative disc space narrowing of the thoracic spine. IMPRESSION: There is no pneumonia nor CHF nor other acute cardiopulmonary abnormality. Prominence of the pulmonary interstitial markings is consistent with the patient's long smoking history. Electronically Signed   By: David  Martinique M.D.   On: 07/11/2015 12:57    Disposition: 06-Home-Health Care Svc      Discharge Instructions    CPM    Complete by:  As directed   Continuous passive motion machine (CPM):      Use the CPM from 0 to 90 for 6 hours per day.       You may break it up into 2 or 3 sessions per day.      Use CPM for 2 weeks or until you are told to stop.     Call MD / Call 911    Complete by:  As directed   If you experience chest pain or shortness of breath, CALL 911 and be transported to the hospital emergency room.  If you develope a fever above 101  F, pus (white drainage) or increased drainage or redness at the wound, or calf pain, call your surgeon's office.  Change dressing    Complete by:  As directed   Change the gauze dressing daily with sterile 4 x 4 inch gauze and apply TED hose.  DO NOT REMOVE BANDAGE OVER SURGICAL INCISION.  [place] WHOLE LEG INCLUDING OVER THE WATERPROOF BANDAGE WITH SOAP AND WATER EVERY DAY.     Constipation Prevention    Complete by:  As directed   Drink plenty of fluids.  Prune juice may be helpful.  You may use a stool softener, such as Colace (over the counter) 100 mg twice a day.  Use MiraLax (over the counter) for constipation as needed.     Diet - low sodium heart healthy    Complete by:  As directed      Discharge instructions    Complete by:  As directed   INSTRUCTIONS AFTER JOINT REPLACEMENT   Remove items at home which could result in a fall. This includes throw rugs or furniture in walking pathways ICE to the affected joint every three hours while awake for 30 minutes at a time, for at least the first 3-5 days, and then as needed for pain and swelling.  Continue to use ice for pain and swelling. You may notice swelling that will progress down to the foot and ankle.  This is normal after surgery.  Elevate your leg when you are not up walking on it.   Continue to use the breathing machine you got in the hospital (incentive spirometer) which will help keep your temperature down.  It is common for your temperature to cycle up and down following surgery, especially at night when you are not up moving around and exerting yourself.  The breathing machine keeps your lungs expanded and your temperature down.   DIET:  As you were doing prior to hospitalization, we recommend a well-balanced diet.  DRESSING / WOUND CARE / SHOWERING  Keep the surgical dressing until follow up.  The dressing is water proof, so you can shower without any extra covering.  IF THE DRESSING FALLS OFF or the wound gets wet inside,  change the dressing with sterile gauze.  Please use good hand washing techniques before changing the dressing.  Do not use any lotions or creams on the incision until instructed by your surgeon.    ACTIVITY  Increase activity slowly as tolerated, but follow the weight bearing instructions below.   No driving for 6 weeks or until further direction given by your physician.  You cannot drive while taking narcotics.  No lifting or carrying greater than 10 lbs. until further directed by your surgeon. Avoid periods of inactivity such as sitting longer than an hour when not asleep. This helps prevent blood clots.  You may return to work once you are authorized by your doctor.     WEIGHT BEARING   Weight bearing as tolerated with assist device cane as directed, use it as long as suggested by your surgeon or therapist, typically at least 3-4 weeks.   EXERCISES  Results after joint replacement surgery are often greatly improved when you follow the exercise, range of motion and muscle strengthening exercises prescribed by your doctor. Safety measures are also important to protect the joint from further injury. Any time any of these exercises cause you to have increased pain or swelling, decrease what you are doing until you are comfortable again and then slowly increase them. If you have problems or questions, call your caregiver or physical therapist for advice.   Rehabilitation is important following a joint  replacement. After just a few days of immobilization, the muscles of the leg can become weakened and shrink (atrophy).  These exercises are designed to build up the tone and strength of the thigh and leg muscles and to improve motion. Often times heat used for twenty to thirty minutes before working out will loosen up your tissues and help with improving the range of motion but do not use heat for the first two weeks following surgery (sometimes heat can increase post-operative swelling).   These  exercises can be done on a training (exercise) mat, on the floor, on a table or on a bed. Use whatever works the best and is most comfortable for you.    Use music or television while you are exercising so that the exercises are a pleasant break in your day. This will make your life better with the exercises acting as a break in your routine that you can look forward to.   Perform all exercises about fifteen times, three times per day or as directed.  You should exercise both the operative leg and the other leg as well.   Exercises include:   Quad Sets - Tighten up the muscle on the front of the thigh (Quad) and hold for 5-10 seconds.   Straight Leg Raises - With your knee straight (if you were given a brace, keep it on), lift the leg to 60 degrees, hold for 3 seconds, and slowly lower the leg.  Perform this exercise against resistance later as your leg gets stronger.  Leg Slides: Lying on your back, slowly slide your foot toward your buttocks, bending your knee up off the floor (only go as far as is comfortable). Then slowly slide your foot back down until your leg is flat on the floor again.  Angel Wings: Lying on your back spread your legs to the side as far apart as you can without causing discomfort.  Hamstring Strength:  Lying on your back, push your heel against the floor with your leg straight by tightening up the muscles of your buttocks.  Repeat, but this time bend your knee to a comfortable angle, and push your heel against the floor.  You may put a pillow under the heel to make it more comfortable if necessary.   A rehabilitation program following joint replacement surgery can speed recovery and prevent re-injury in the future due to weakened muscles. Contact your doctor or a physical therapist for more information on knee rehabilitation.    CONSTIPATION  Constipation is defined medically as fewer than three stools per week and severe constipation as less than one stool per week.  Even if  you have a regular bowel pattern at home, your normal regimen is likely to be disrupted due to multiple reasons following surgery.  Combination of anesthesia, postoperative narcotics, change in appetite and fluid intake all can affect your bowels.   YOU MUST use at least one of the following options; they are listed in order of increasing strength to get the job done.  They are all available over the counter, and you may need to use some, POSSIBLY even all of these options:    Drink plenty of fluids (prune juice may be helpful) and high fiber foods Colace 100 mg by mouth twice a day  Senokot for constipation as directed and as needed Dulcolax (bisacodyl), take with full glass of water  Miralax (polyethylene glycol) once or twice a day as needed.  If you have tried all these things and  are unable to have a bowel movement in the first 3-4 days after surgery call either your surgeon or your primary doctor.    If you experience loose stools or diarrhea, hold the medications until you stool forms back up.  If your symptoms do not get better within 1 week or if they get worse, check with your doctor.  If you experience "the worst abdominal pain ever" or develop nausea or vomiting, please contact the office immediately for further recommendations for treatment.   ITCHING:  If you experience itching with your medications, try taking only a single pain pill, or even half a pain pill at a time.  You can also use Benadryl over the counter for itching or also to help with sleep.   TED HOSE STOCKINGS:  Use stockings on both legs until for at least 2 weeks or as directed by physician office. They may be removed at night for sleeping.  MEDICATIONS:  See your medication summary on the "After Visit Summary" that nursing will review with you.  You may have some home medications which will be placed on hold until you complete the course of blood thinner medication.  It is important for you to complete the blood  thinner medication as prescribed.  PRECAUTIONS:  If you experience chest pain or shortness of breath - call 911 immediately for transfer to the hospital emergency department.   If you develop a fever greater that 101 F, purulent drainage from wound, increased redness or drainage from wound, foul odor from the wound/dressing, or calf pain - CONTACT YOUR SURGEON.                                                   FOLLOW-UP APPOINTMENTS:  If you do not already have a post-op appointment, please call the office for an appointment to be seen by your surgeon.  Guidelines for how soon to be seen are listed in your "After Visit Summary", but are typically between 1-4 weeks after surgery.  OTHER INSTRUCTIONS:   Knee Replacement:  Do not place pillow under knee, focus on keeping the knee straight while resting. CPM instructions: 0-90 degrees, 2 hours in the morning, 2 hours in the afternoon, and 2 hours in the evening. Place foam block, curve side up under heel at all times except when in CPM or when walking.  DO NOT modify, tear, cut, or change the foam block in any way.  MAKE SURE YOU:  Understand these instructions.  Get help right away if you are not doing well or get worse.    Thank you for letting us be a part of your medical care team.  It is a privilege we respect greatly.  We hope these instructions will help you stay on track for a fast and full recovery!     Do not put a pillow under the knee. Place it under the heel.    Complete by:  As directed   Place gray foam block, curve side up under heel at all times except when in CPM or when walking.  DO NOT modify, tear, cut, or change in any way the gray foam block.     Increase activity slowly as tolerated    Complete by:  As directed      TED hose    Complete by:  As directed  Use stockings (TED hose) for 2 weeks on both leg(s).  You may remove them at night for sleeping.           Follow-up Information    Follow up with Lorn Junes, MD On 08/05/2015.   Specialty:  Orthopedic Surgery   Why:  APPT TIME 3:10 PM   Contact information:   [address] [place] [address] [phone]      I HAVE WRITTEN THIS PATIENT ALL THE PAIN MEDICATION I EXPECT HIM TO TAKE UNTIL HE FOLLOWS UP AT [facility].  HE WAS GIVEN A SCRIPT FROM [facility] PRIOR TO SURGERY.  I HAVE INSTRUCTED HIM AND HIS WIFE TO SEAL THIS ON THE DAY OF SURGERY AND NOT TAKE ANY MORE OF THAT SCRIPT THAT WAS PROVIDED BY [facility].  HE IS TO PRESENT THAT BOTTLE TO [facility] AT HIS FOLLOW UP APPT ON August 13, 2015.  I HAVE WRITTEN HIM 42 LONG ACTING MSCONTIN 30MG  PILLS.  HE IS INSTRUCTED TO TAKE THESE 2 PILLS TWICE A DAY FOR 1 WEEK THEN 1 PILL TWICE A DAY FOR 1 WEEK.  HE IS ALSO GIVEN 120 OXYCODONE 15 MG PILLS WITH THE INSTRUCTION OF TAKING 1 TABLET EVERY 3 HRS FOR BREAK THROUGH PAIN BUT NOT TO TAKE WITHIN 3 HRS OF THE LONG ACTING MEDICATION.  THIS IS A MAX OF 6 PILLS A DAY TO LAST HIM UNTIL HE IS SEEN ON August 13, 2015 AT [facility].  I HAVE INSTRUCTED THE PATIENT TO NOT TAKE ANY OTHER MEDICATION THAN WHAT I HAVE PRESCRIBED UNTIL HE FOLLOWS UP WITH THE PAIN MANAGEMENT SPECIALIST.    SignedLinda Hedges 07/25/2015, 7:29 AM

## 2015-07-25 NOTE — Care Management Note (Signed)
Case Management Note  Patient Details  Name: ANTOIN MCCULLERS MRN: [id number] Date of Birth: [birth year]  Subjective/Objective:    S/p right total knee arthroplasty                Action/Plan: Patient has outpatient therapy at [facility] 07/30/15 at 11:30am, set up by MD office. Patient will not have home therapy visits.Spoke with patient and his girlfriend, he will be staying at her home after discharge and [employer] will be delivering CPM to her home. Patient stated that he has a rolling walker and a 3N1. Gave them the outpatient therapy appointment information.        Expected Discharge Date:                  Expected Discharge Plan:  Home/Self Care  In-House Referral:  NA  Discharge planning Services  CM Consult  Post Acute Care Choice:  Durable Medical Equipment Choice offered to:     DME Arranged:  CPM DME Agency:  [employer]  HH Arranged:    HH Agency:     Status of Service:  Completed, signed off  Medicare Important Message Given:    Date Medicare IM Given:    Medicare IM give by:    Date Additional Medicare IM Given:    Additional Medicare Important Message give by:     If discussed at [place] of Stay Meetings, dates discussed:    Additional Comments:  Nila Nephew, RN 07/25/2015, 8:58 AM

## 2015-07-25 NOTE — Progress Notes (Signed)
Physical Therapy Treatment Patient Details Name: Jim [name] MRN: [id number] DOB: 01/09/1956 Today's Date: 07/25/2015    History of Present Illness 59 y.o. male admitted [facility] on 07/21/15 for elective R TKA.  Pt with significant PMHx of arthritis "everywhere" (back and neck sepcifically reported by pt), and L TKA.      PT Comments    Patient with improved gait mechanics and ability to complete HEP with min cues. Pt tolerated exercises well and demonstrated improved ROM and activity tolerance. Current plan remains appropriate.   Follow Up Recommendations  Home health PT;Supervision for mobility/OOB     Equipment Recommendations  None recommended by PT    Recommendations for Other Services       Precautions / Restrictions Precautions Precautions: Knee Precaution Booklet Issued: Yes (comment) Precaution Comments: exercise handout given and precaution reviewed Required Braces or Orthoses: Knee Immobilizer - Right Knee Immobilizer - Right: Other (comment) (until d/c'd) Restrictions Weight Bearing Restrictions: Yes RLE Weight Bearing: Weight bearing as tolerated    Mobility  Bed Mobility               General bed mobility comments: OOB in chair upon arrival  Transfers Overall transfer level: Needs assistance Equipment used: Rolling walker (2 wheeled) Transfers: Sit to/from Stand (X2 from recliner ) Sit to Stand: Modified independent (Device/Increase time)         General transfer comment: good hand placement and technique  Ambulation/Gait Ambulation/Gait assistance: Supervision Ambulation Distance (Feet): 250 Feet Assistive device: Straight cane Gait Pattern/deviations: Step-through pattern;Antalgic     General Gait Details: slightly antalgic gait; pt with improved gait mechanics and increased cadence after initial 50 ft   Stairs            Wheelchair Mobility    Modified Rankin (Stroke Patients Only)       Balance Overall balance  assessment: Needs assistance Sitting-balance support: Feet supported Sitting balance-Leahy Scale: Good     Standing balance support: Single extremity supported Standing balance-Leahy Scale: Good                      Cognition Arousal/Alertness: Awake/alert Behavior During Therapy: WFL for tasks assessed/performed Overall Cognitive Status: Within Functional Limits for tasks assessed                      Exercises Total Joint Exercises Quad Sets: AROM;Right;10 reps Heel Slides: AROM;Right;10 reps Hip ABduction/ADduction: AROM;Right;10 reps Straight Leg Raises: AROM;Right;5 reps Long Arc Quad: AROM;Right;10 reps;Seated Knee Flexion: AROM;Right;Seated;10 reps;Standing Goniometric ROM: 0-90 Marching in Standing: AROM;Right;10 reps;Standing    General Comments        Pertinent Vitals/Pain Pain Assessment: Faces Faces Pain Scale: Hurts even more (with knee flexion) Pain Location: R knee Pain Descriptors / Indicators: Sore Pain Intervention(s): Limited activity within patient's tolerance;Premedicated before session;Monitored during session    Home Living                      Prior Function            PT Goals (current goals can now be found in the care plan section) Acute Rehab PT Goals Patient Stated Goal: none stated PT Goal Formulation: With patient Time For Goal Achievement: 07/28/15 Progress towards PT goals: Progressing toward goals    Frequency  7X/week    PT Plan Current plan remains appropriate    Co-evaluation  End of Session Equipment Utilized During Treatment: Gait belt Activity Tolerance: Patient limited by pain Patient left: in chair;with call bell/phone within reach;with family/visitor present     Time: 0921-0938 PT Time Calculation (min) (ACUTE ONLY): 17 min  Charges:  $Gait Training: 8-22 mins                    G Codes:      Salina April, PTA Pager: [phone]   07/25/2015, 10:53 AM

## 2015-11-06 ENCOUNTER — Telehealth: Payer: Self-pay | Admitting: Family Medicine

## 2015-11-06 NOTE — Telephone Encounter (Signed)
Pt dropped off a form to be filled out for a handicap placard. Form in yellow folder at nurse station.

## 2015-11-06 NOTE — Telephone Encounter (Signed)
Form was completed 

## 2015-11-06 NOTE — Telephone Encounter (Signed)
Form is in yellow folder in Dr. Gabriel Carina.

## 2015-11-10 ENCOUNTER — Ambulatory Visit: Payer: Medicaid Other | Admitting: Family Medicine

## 2015-12-09 ENCOUNTER — Ambulatory Visit: Payer: Medicaid Other | Admitting: Family Medicine

## 2016-01-10 ENCOUNTER — Other Ambulatory Visit: Payer: Self-pay | Admitting: Family Medicine

## 2016-02-19 ENCOUNTER — Other Ambulatory Visit: Payer: Self-pay | Admitting: Family Medicine

## 2016-02-19 ENCOUNTER — Ambulatory Visit (INDEPENDENT_AMBULATORY_CARE_PROVIDER_SITE_OTHER): Payer: Medicaid Other | Admitting: Family Medicine

## 2016-02-19 ENCOUNTER — Encounter: Payer: Self-pay | Admitting: Family Medicine

## 2016-02-19 VITALS — BP 132/78 | Temp 98.3°F | Ht 69.0 in | Wt 240.0 lb

## 2016-02-19 DIAGNOSIS — I10 Essential (primary) hypertension: Secondary | ICD-10-CM | POA: Diagnosis not present

## 2016-02-19 DIAGNOSIS — Z125 Encounter for screening for malignant neoplasm of prostate: Secondary | ICD-10-CM | POA: Diagnosis not present

## 2016-02-19 DIAGNOSIS — L739 Follicular disorder, unspecified: Secondary | ICD-10-CM | POA: Diagnosis not present

## 2016-02-19 DIAGNOSIS — R7303 Prediabetes: Secondary | ICD-10-CM | POA: Diagnosis not present

## 2016-02-19 DIAGNOSIS — E785 Hyperlipidemia, unspecified: Secondary | ICD-10-CM

## 2016-02-19 MED ORDER — SULFAMETHOXAZOLE-TRIMETHOPRIM 800-160 MG PO TABS: 1.0000 | ORAL_TABLET | Freq: Two times a day (BID) | ORAL | Status: DC

## 2016-02-19 NOTE — Progress Notes (Signed)
   Subjective:    Patient ID: Jim [name], male    DOB: 12-Jan-1956, 60 y.o.   MRN: [id number]  HPISores on right leg. Burning pain, clear drainage. Came up 3 weeks ago.  Patient states no skin sores are slightly tender denies any other particular troubles Sore on lip. Came up today.   Sore on left side of face. Came up yesterday.     Review of Systems Denies high fever chills sweats.    Objective:   Physical Exam  Multiple areas of folliculitis on right lower leg couple small areas of cellulitis no abscesses are seen lungs are clear heart regular He also has a very small spot on his face     Assessment & Plan:  Folliculitis with cellulitis antibiotics prescribed warning signs discussed follow-up if problems warm compresses on a regular basis

## 2016-04-06 ENCOUNTER — Other Ambulatory Visit: Payer: Self-pay | Admitting: Family Medicine

## 2016-04-07 MED ORDER — SULFAMETHOXAZOLE-TRIMETHOPRIM 800-160 MG PO TABS: 1.0000 | ORAL_TABLET | Freq: Two times a day (BID) | ORAL | 0 refills | Status: DC

## 2016-04-08 ENCOUNTER — Ambulatory Visit: Payer: Medicaid Other | Admitting: Family Medicine

## 2016-04-13 ENCOUNTER — Ambulatory Visit (INDEPENDENT_AMBULATORY_CARE_PROVIDER_SITE_OTHER): Payer: Medicaid Other | Admitting: Family Medicine

## 2016-04-13 ENCOUNTER — Encounter: Payer: Self-pay | Admitting: Family Medicine

## 2016-04-13 VITALS — BP 132/84 | Ht 69.0 in | Wt 237.8 lb

## 2016-04-13 DIAGNOSIS — E785 Hyperlipidemia, unspecified: Secondary | ICD-10-CM | POA: Diagnosis not present

## 2016-04-13 DIAGNOSIS — G4700 Insomnia, unspecified: Secondary | ICD-10-CM | POA: Diagnosis not present

## 2016-04-13 DIAGNOSIS — R739 Hyperglycemia, unspecified: Secondary | ICD-10-CM

## 2016-04-13 MED ORDER — TRAZODONE HCL 50 MG PO TABS: ORAL_TABLET | ORAL | 3 refills | Status: DC

## 2016-04-13 NOTE — Progress Notes (Signed)
   Subjective:    Patient ID: Jim [name], male    DOB: 1956/04/11, 60 y.o.   MRN: [id number]  Hypertension  This is a chronic problem. The current episode started more than 1 year ago. Pertinent negatives include no chest pain. Risk factors for coronary artery disease include dyslipidemia and male gender. Past treatments include lifestyle changes. There are no compliance problems.    Patient  has not had blood work that was ordered done but was wanting to go when he leaves office Patient does follow with pain medicine doctor for his medications Patient does try to adhere to a healthy diet Patient does have moderate insomnia issues difficult time falling asleep. Denies being depressed. Patient no longer smokes Patient up-to-date on colonoscopy   Review of Systems  Constitutional: Negative for activity change, appetite change and fatigue.  HENT: Negative for congestion.   Respiratory: Negative for cough.   Cardiovascular: Negative for chest pain.  Gastrointestinal: Negative for abdominal pain.  Endocrine: Negative for polydipsia and polyphagia.  Neurological: Negative for weakness.  Psychiatric/Behavioral: Negative for confusion.       Objective:   Physical Exam  Constitutional: He appears well-nourished. No distress.  Cardiovascular: Normal rate, regular rhythm and normal heart sounds.   No murmur heard. Pulmonary/Chest: Effort normal and breath sounds normal. No respiratory distress.  Musculoskeletal: He exhibits no edema.  Lymphadenopathy:    He has no cervical adenopathy.  Neurological: He is alert.  Psychiatric: His behavior is normal.  Vitals reviewed.  Patient denies being depressed does have insomnia issues difficult time falling asleep difficulty time staying asleep. Wondered if nerve medicine or of sleep medicine would help we talked about safety of medications with his pain medicine therefore we will stick with trazodone   Follow-up in 6 months sooner if any  problems    Assessment & Plan:  HTN good control watch diet stay physically active Follow-up with pain management for pain medicines Insomnia issues try trazodone to see if they'll help sleep stopped Celexa History hyperglycemia as well as hyperglycemia and hyperlipidemia previous lab work reviewed with patient importance of healthy eating regular physical activity discussed. Check lab work await the results

## 2016-04-14 ENCOUNTER — Other Ambulatory Visit: Payer: Self-pay

## 2016-04-14 LAB — HEMOGLOBIN A1C
ESTIMATED AVERAGE GLUCOSE: 140 mg/dL
Hgb A1c MFr Bld: 6.5 % — ABNORMAL HIGH (ref 4.8–5.6)

## 2016-04-14 LAB — LIPID PANEL
CHOLESTEROL TOTAL: 148 mg/dL (ref 100–199)
Chol/HDL Ratio: 5.1 ratio units — ABNORMAL HIGH (ref 0.0–5.0)
HDL: 29 mg/dL — ABNORMAL LOW (ref >39)
LDL Calculated: 72 mg/dL (ref 0–99)
TRIGLYCERIDES: 236 mg/dL — AB (ref 0–149)
VLDL Cholesterol Cal: 47 mg/dL — ABNORMAL HIGH (ref 5–40)

## 2016-04-14 LAB — BASIC METABOLIC PANEL
BUN/Creatinine Ratio: 16 (ref 10–24)
BUN: 16 mg/dL (ref 8–27)
CO2: 22 mmol/L (ref 18–29)
CREATININE: 1.02 mg/dL (ref 0.76–1.27)
Calcium: 8.4 mg/dL — ABNORMAL LOW (ref 8.6–10.2)
Chloride: 104 mmol/L (ref 96–106)
GFR calc Af Amer: 91 mL/min/{1.73_m2} (ref >59)
GFR calc non Af Amer: 79 mL/min/{1.73_m2} (ref >59)
GLUCOSE: 97 mg/dL (ref 65–99)
POTASSIUM: 4.2 mmol/L (ref 3.5–5.2)
SODIUM: 141 mmol/L (ref 134–144)

## 2016-04-14 LAB — MICROALBUMIN / CREATININE URINE RATIO
Creatinine, Urine: 114 mg/dL
MICROALB/CREAT RATIO: 20.4 mg/g creat (ref 0.0–30.0)
MICROALBUM., U, RANDOM: 23.2 ug/mL

## 2016-04-14 LAB — PSA: Prostate Specific Ag, Serum: 0.8 ng/mL (ref 0.0–4.0)

## 2016-04-14 MED ORDER — METFORMIN HCL 500 MG PO TABS: 250.0000 mg | ORAL_TABLET | Freq: Two times a day (BID) | ORAL | 4 refills | Status: DC

## 2016-05-11 ENCOUNTER — Telehealth: Payer: Self-pay | Admitting: Family Medicine

## 2016-05-11 NOTE — Telephone Encounter (Signed)
Patient called today to make an appointment for his depression.  He is not having any suicidal thoughts, but he doesn't feel that he can wait until May 25, 2016, which is Dr. Bary Leriche next available, to be seen.  He said he would like to be worked in Dr. Bary Leriche schedule when he is able to or if we could increase the dosage on his depression medication.

## 2016-05-11 NOTE — Telephone Encounter (Signed)
Recommend follow-up office visit tomorrow

## 2016-05-11 NOTE — Telephone Encounter (Signed)
Appt sch'd, pt aware

## 2016-05-12 ENCOUNTER — Encounter: Payer: Self-pay | Admitting: Family Medicine

## 2016-05-12 ENCOUNTER — Ambulatory Visit (INDEPENDENT_AMBULATORY_CARE_PROVIDER_SITE_OTHER): Payer: Medicaid Other | Admitting: Family Medicine

## 2016-05-12 VITALS — BP 130/82 | Ht 69.0 in | Wt 232.4 lb

## 2016-05-12 DIAGNOSIS — F332 Major depressive disorder, recurrent severe without psychotic features: Secondary | ICD-10-CM | POA: Diagnosis not present

## 2016-05-12 DIAGNOSIS — Z23 Encounter for immunization: Secondary | ICD-10-CM | POA: Diagnosis not present

## 2016-05-12 MED ORDER — SERTRALINE HCL 100 MG PO TABS: 100.0000 mg | ORAL_TABLET | Freq: Every day | ORAL | 3 refills | Status: DC

## 2016-05-12 NOTE — Progress Notes (Signed)
   Subjective:    Patient ID: Jim [name], male    DOB: 03-28-1956, 60 y.o.   MRN: [id number]  HPI Patient arrives with c/o issues with depression. Patient states it got worse in 2023-03-05 when his sister passed away. Patient relates fair amount of sadness related to the fact that his sister passed away in 2023/03/05 she had declining health. Nonetheless it's been very traumatic to the patient because he is very close to her that was one of his best friends that he would talk to every day now he states he has found himself being isolated not wanting to get up out of bed not wanting eat and not wanting the help around the house or work outside. The patient denies being suicidal. States he loves his wife too much to do that.  Review of Systems Patient denies being suicidal. Denies chest pain shortness of breath.    Objective:   Physical Exam   patient has lack of desire lack of energyof  30 minutes spent with patient discussing his depression    Assessment & Plan:  Severe depression not suicidal referral for counseling initiate Zoloft follow-up within 2-3 weeks patient gave verbal agreement if he becomes suicidal he will immediately seek help here or ER

## 2016-05-21 ENCOUNTER — Encounter: Payer: Self-pay | Admitting: Family Medicine

## 2016-05-31 ENCOUNTER — Ambulatory Visit: Payer: Medicaid Other | Admitting: Family Medicine

## 2016-06-25 ENCOUNTER — Telehealth: Payer: Self-pay | Admitting: Family Medicine

## 2016-06-25 MED ORDER — TRAMADOL HCL 50 MG PO TABS: 50.0000 mg | ORAL_TABLET | Freq: Four times a day (QID) | ORAL | 0 refills | Status: DC | PRN

## 2016-06-25 MED ORDER — PENICILLIN V POTASSIUM 500 MG PO TABS: 500.0000 mg | ORAL_TABLET | Freq: Three times a day (TID) | ORAL | 0 refills | Status: DC

## 2016-06-25 NOTE — Telephone Encounter (Signed)
Pen vk 500 tid ten d ultram fifty numb 28n one q six prn pain (rec add bid two aleave otc to this)

## 2016-06-25 NOTE — Telephone Encounter (Signed)
Patient has a really bad toothache.  He said he is trying to get in touch with a dentist but has had no luck yet.  He wants to know if we can send him in an Rx for this?     [place]

## 2016-06-25 NOTE — Telephone Encounter (Signed)
Notified patient Penicillin VK 500 mg TID ten days and ultram fifty mg #28 one q six prn pain (recommend add bid two aleve otc to this). Patient verbalized understanding. Meds sent to pharmacy.

## 2016-07-05 ENCOUNTER — Telehealth (HOSPITAL_COMMUNITY): Payer: Self-pay | Admitting: *Deleted

## 2016-07-05 NOTE — Telephone Encounter (Signed)
patient called to cancel appointment scheduled 07/13/16.   When asked if he would like to reschedule, he said he got with his family over the weekend, they talked everything over and he think he is going to be okay.Did not wish to reschedule appointment.

## 2016-07-07 ENCOUNTER — Encounter: Payer: Self-pay | Admitting: Family Medicine

## 2016-07-07 ENCOUNTER — Ambulatory Visit (INDEPENDENT_AMBULATORY_CARE_PROVIDER_SITE_OTHER): Payer: Medicaid Other | Admitting: Family Medicine

## 2016-07-07 DIAGNOSIS — F324 Major depressive disorder, single episode, in partial remission: Secondary | ICD-10-CM | POA: Insufficient documentation

## 2016-07-07 MED ORDER — SERTRALINE HCL 100 MG PO TABS: 100.0000 mg | ORAL_TABLET | Freq: Every day | ORAL | 6 refills | Status: DC

## 2016-07-07 NOTE — Progress Notes (Signed)
   Subjective:    Patient ID: Jim [name], male    DOB: 1956/02/12, 60 y.o.   MRN: [id number]  HPIFollow up on anxiety. Started on zoloft. Pt states med is working great. Feels better now than he has in a long time.  Patient states Zoloft helping greatly moods are doing good energy doing good appetite good denies any problems currently Pt states no concerns.     Review of Systems    no chest tightness pressure pain shortness of breath no fevers Objective:   Physical Exam  Lungs clear heart regular pulse normal BP good      Assessment & Plan:  Major depression much improved continue medication follow-up in the early spring lab work in the springtime

## 2016-07-09 NOTE — Progress Notes (Deleted)
Psychiatric Initial Adult Assessment   Patient Identification: Jim [name] MRN:  [id number] Date of Evaluation:  07/09/2016 Referral Source: *** Chief Complaint:   Visit Diagnosis: No diagnosis found.  History of Present Illness:  ***  Associated Signs/Symptoms: Depression Symptoms:  {DEPRESSION SYMPTOMS:20000} (Hypo) Manic Symptoms:  {BHH MANIC SYMPTOMS:22872} Anxiety Symptoms:  {BHH ANXIETY SYMPTOMS:22873} Psychotic Symptoms:  {BHH PSYCHOTIC SYMPTOMS:22874} PTSD Symptoms: {BHH PTSD SYMPTOMS:22875}  Past Psychiatric History: ***  Previous Psychotropic Medications: {YES/NO:21197}  Substance Abuse History in the last 12 months:  {yes no:314532}  Consequences of Substance Abuse: {BHH CONSEQUENCES OF SUBSTANCE ABUSE:22880}  Past Medical History:  Past Medical History:  Diagnosis Date  . Arthritis   . Back pain   . Bursitis of hip    Bilateral  . Depression   . 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 Ex-smoker    Quit 11/07/2011  . Gastric ulcer   . History of colon polyps   . Hyperlipidemia   . Primary localized osteoarthritis of right knee     Past Surgical History:  Procedure Laterality Date  . CARPAL TUNNEL RELEASE     right  . CARPAL TUNNEL RELEASE Left 05/16/13   Dr Noemi Chapel  . COLONOSCOPY  01/31/2012   Procedure: COLONOSCOPY;  Surgeon: Daneil Dolin, MD;  [facility];  Service: Endoscopy;  Laterality: N/A;  7:30 AM  . FINGER AMPUTATION     partial left 5th finger  . JOINT REPLACEMENT    . KNEE ARTHROSCOPY     right knee  . ORIF FINGER FRACTURE     Right 5th finger, Amputation reatachment  . TONSILLECTOMY    . TOTAL KNEE ARTHROPLASTY  09/04/2012   Procedure: TOTAL KNEE ARTHROPLASTY;  Surgeon: Lorn Junes, MD;  Location: [facility];  Service: Orthopedics;  Laterality: Left;  . TOTAL KNEE ARTHROPLASTY Right 07/21/2015   Procedure: RIGHT TOTAL KNEE ARTHROPLASTY;  Surgeon: Elsie Saas, MD;  Location: [facility];  Service:  Orthopedics;  Laterality: Right;    Family Psychiatric History: ***  Family History:  Family History  Problem Relation Age of Onset  . Heart disease Mother   . Heart disease Father   . Diabetes type I Father   . Heart disease Sister   . Heart disease Brother   . Hypertension Father   . Cancer Father     Social History:   Social History   Social History  . Marital status: Legally Separated    Spouse name: N/A  . Number of children: N/A  . Years of education: N/A   Occupational History  . disabled Other   Social History Main Topics  . Smoking status: Former Smoker    Packs/day: 2.00    Years: 40.00    Types: Cigarettes    Quit date: 10/31/2011  . Smokeless tobacco: Never Used  . Alcohol use Yes     Comment: 1 beer every 3-4 months  . Drug use: No  . Sexual activity: Yes   Other Topics Concern  . Not on file   Social History Narrative  . No narrative on file    Additional Social History: ***  Allergies:   Allergies  Allergen Reactions  . Doxycycline Nausea Only  . Pravastatin     myalgia    Metabolic Disorder Labs: Lab Results  Component Value Date   HGBA1C 6.5 (H) 04/13/2016   MPG 134 07/23/2015   MPG 134 07/11/2015   No results found  for: PROLACTIN Lab Results  Component Value Date   CHOL 148 04/13/2016   TRIG 236 (H) 04/13/2016   HDL 29 (L) 04/13/2016   CHOLHDL 5.1 (H) 04/13/2016   VLDL 36 08/13/2014   LDLCALC 72 04/13/2016   LDLCALC 113 (H) 12/25/2014     Current Medications: Current Outpatient Prescriptions  Medication Sig Dispense Refill  . acetaminophen (TYLENOL) 325 MG tablet Take 2 tablets (650 mg total) by mouth every 6 (six) hours as needed for mild pain (or Fever >/= 101).    Marland Kitchen albuterol (PROVENTIL HFA;VENTOLIN HFA) 108 (90 BASE) MCG/ACT inhaler Inhale 2 puffs into the lungs every 6 (six) hours as needed for wheezing. 1 Inhaler 6  . metFORMIN (GLUCOPHAGE) 500 MG tablet Take 0.5 tablets (250 mg total) by mouth 2 (two) times  daily with a meal. 30 tablet 4  . Multiple Vitamin (MULTIVITAMIN WITH MINERALS) TABS Take 1 tablet by mouth daily.    Marland Kitchen oxyCODONE (ROXICODONE) 15 MG immediate release tablet 1 tablet every 3 hrs for break through pain.  DO NOT TAKE AT THE SAME TIME AS MSCONTIN.   MAX OF 6 PILLS A DAY. THIS SCRIPT MUST LAST UNTIL APPT AT [facility] ON 08/13/2015 120 tablet 0  . penicillin v potassium (VEETID) 500 MG tablet Take 1 tablet (500 mg total) by mouth 3 (three) times daily. X 10 days 30 tablet 0  . sertraline (ZOLOFT) 100 MG tablet Take 1 tablet (100 mg total) by mouth daily. 30 tablet 6  . tiZANidine (ZANAFLEX) 4 MG tablet Take 4 mg by mouth every 8 (eight) hours.  0  . traZODone (DESYREL) 50 MG tablet 1 each evening 30 tablet 3   No current facility-administered medications for this visit.     Neurologic: Headache: {BHH YES OR NO:22294} Seizure: {BHH YES OR NO:22294} Paresthesias:{BHH YES OR XR:537143  Musculoskeletal: Strength & Muscle Tone: {desc; muscle tone:32375} Gait & Station: {PE GAIT ED QX:8161427 Patient leans: {Patient Leans:21022755}  Psychiatric Specialty Exam: ROS  There were no vitals taken for this visit.There is no height or weight on file to calculate BMI.  General Appearance: {Appearance:22683}  Eye Contact:  {BHH EYE CONTACT:22684}  Speech:  {Speech:22685}  Volume:  {Volume (PAA):22686}  Mood:  {BHH MOOD:22306}  Affect:  {Affect (PAA):22687}  Thought Process:  {Thought Process (PAA):22688}  Orientation:  {BHH ORIENTATION (PAA):22689}  Thought Content:  {Thought Content:22690}  Suicidal Thoughts:  {ST/HT (PAA):22692}  Homicidal Thoughts:  {ST/HT (PAA):22692}  Memory:  {BHH MEMORY:22881}  Judgement:  {Judgement (PAA):22694}  Insight:  {Insight (PAA):22695}  Psychomotor Activity:  {Psychomotor (PAA):22696}  Concentration:  {Concentration:21399}  Recall:  {BHH GOOD/FAIR/POOR:22877}  Fund of Knowledge:{BHH GOOD/FAIR/POOR:22877}  Language: {BHH  GOOD/FAIR/POOR:22877}  Akathisia:  {BHH YES OR NO:22294}  Handed:  {Handed:22697}  AIMS (if indicated):  ***  Assets:  {Assets (PAA):22698}  ADL's:  {BHH TW:9249394  Cognition: {chl bhh cognition:304700322}  Sleep:  ***    Treatment Plan Summary: {CHL AMB BH MD TX YF:[id number]   Norman Clay, MD 12/8/20178:07 AM

## 2016-07-13 ENCOUNTER — Ambulatory Visit (HOSPITAL_COMMUNITY): Payer: Self-pay | Admitting: Psychiatry

## 2016-08-13 ENCOUNTER — Encounter: Payer: Self-pay | Admitting: Family Medicine

## 2016-08-13 ENCOUNTER — Ambulatory Visit (INDEPENDENT_AMBULATORY_CARE_PROVIDER_SITE_OTHER): Payer: Medicaid Other | Admitting: Family Medicine

## 2016-08-13 ENCOUNTER — Telehealth: Payer: Self-pay | Admitting: Family Medicine

## 2016-08-13 VITALS — BP 120/74 | Ht 69.0 in | Wt 230.2 lb

## 2016-08-13 DIAGNOSIS — D638 Anemia in other chronic diseases classified elsewhere: Secondary | ICD-10-CM

## 2016-08-13 DIAGNOSIS — Z23 Encounter for immunization: Secondary | ICD-10-CM

## 2016-08-13 DIAGNOSIS — Z113 Encounter for screening for infections with a predominantly sexual mode of transmission: Secondary | ICD-10-CM | POA: Diagnosis not present

## 2016-08-13 DIAGNOSIS — R7303 Prediabetes: Secondary | ICD-10-CM

## 2016-08-13 DIAGNOSIS — G894 Chronic pain syndrome: Secondary | ICD-10-CM | POA: Diagnosis not present

## 2016-08-13 DIAGNOSIS — E7849 Other hyperlipidemia: Secondary | ICD-10-CM

## 2016-08-13 DIAGNOSIS — E784 Other hyperlipidemia: Secondary | ICD-10-CM | POA: Diagnosis not present

## 2016-08-13 MED ORDER — TRAZODONE HCL 50 MG PO TABS: ORAL_TABLET | ORAL | 6 refills | Status: DC

## 2016-08-13 MED ORDER — ALBUTEROL SULFATE HFA 108 (90 BASE) MCG/ACT IN AERS: 2.0000 | INHALATION_SPRAY | Freq: Four times a day (QID) | RESPIRATORY_TRACT | 6 refills | Status: DC | PRN

## 2016-08-13 MED ORDER — METFORMIN HCL 500 MG PO TABS: 250.0000 mg | ORAL_TABLET | Freq: Two times a day (BID) | ORAL | 6 refills | Status: DC

## 2016-08-13 NOTE — Telephone Encounter (Signed)
Pt dropped off a form to be filled out and signed. Form is in nurse box. 

## 2016-08-13 NOTE — Telephone Encounter (Signed)
Form forwarded to Dr. Nicki Reaper. Pt seen in office this am.

## 2016-08-13 NOTE — Progress Notes (Signed)
   Subjective:    Patient ID: Jim [name], male    DOB: Sep 22, 1955, 61 y.o.   MRN: [id number]  HPI Patient is here today to discuss being referred to a closer pain management facility due to transportation issues. Patient states that he currently sees a pain management doctor in [place] but it is hard for him to find a ride and pay people to take him that far. He wants a pain doctor that is local, if possible.   Patient wants to also be checked for Hep C.  Patient has history of mild elevation of cholesterol tries watch his diet Recent history of depression under better control Patient with chronic pain and discomfort in his joints and arthritis sees a pain clinic that [facility] oxycodone 15 mg 5 times a day Having a hard time getting to it Also needs some screening lab work Review of Systems  Constitutional: Negative for activity change.  Gastrointestinal: Negative for abdominal pain and vomiting.  Neurological: Negative for weakness.  Psychiatric/Behavioral: Negative for confusion.       Objective:   Physical Exam  Constitutional: He appears well-nourished. No distress.  Cardiovascular: Normal rate, regular rhythm and normal heart sounds.   No murmur heard. Pulmonary/Chest: Effort normal and breath sounds normal. No respiratory distress.  Musculoskeletal: He exhibits no edema.  Lymphadenopathy:    He has no cervical adenopathy.  Neurological: He is alert.  Psychiatric: His behavior is normal.  Vitals reviewed.  Doing well on antidepressant doing well with metformin not having use the albuterol but       Assessment & Plan:  Hyperlipidemia-previous labs reviewed. Healthy eating recommended check lab work  Prediabetes on metformin low-dose watch diet stay active keep weight down recheck A1c  Chronic pain will see if there is a different pain clinic he can get to that is closer than [facility]-this patient is on more medication than what I can safely prescribe as his  family physician  Screening lab work per [employer] guidelines

## 2016-08-14 LAB — HEPATIC FUNCTION PANEL
ALK PHOS: 60 IU/L (ref 39–117)
ALT: 55 IU/L — AB (ref 0–44)
AST: 33 IU/L (ref 0–40)
Albumin: 4.4 g/dL (ref 3.6–4.8)
BILIRUBIN, DIRECT: 0.09 mg/dL (ref 0.00–0.40)
Bilirubin Total: 0.4 mg/dL (ref 0.0–1.2)
Total Protein: 6.7 g/dL (ref 6.0–8.5)

## 2016-08-14 LAB — CBC WITH DIFFERENTIAL/PLATELET
BASOS ABS: 0 10*3/uL (ref 0.0–0.2)
Basos: 0 %
EOS (ABSOLUTE): 0.3 10*3/uL (ref 0.0–0.4)
Eos: 3 %
HEMATOCRIT: 46.3 % (ref 37.5–51.0)
Hemoglobin: 15.7 g/dL (ref 13.0–17.7)
Immature Grans (Abs): 0 10*3/uL (ref 0.0–0.1)
Immature Granulocytes: 0 %
LYMPHS ABS: 1.8 10*3/uL (ref 0.7–3.1)
Lymphs: 20 %
MCH: 29.7 pg (ref 26.6–33.0)
MCHC: 33.9 g/dL (ref 31.5–35.7)
MCV: 88 fL (ref 79–97)
MONOS ABS: 0.6 10*3/uL (ref 0.1–0.9)
Monocytes: 7 %
NEUTROS PCT: 70 %
Neutrophils Absolute: 6.2 10*3/uL (ref 1.4–7.0)
PLATELETS: 209 10*3/uL (ref 150–379)
RBC: 5.28 x10E6/uL (ref 4.14–5.80)
RDW: 14.2 % (ref 12.3–15.4)
WBC: 8.9 10*3/uL (ref 3.4–10.8)

## 2016-08-14 LAB — HEPATITIS C ANTIBODY: Hep C Virus Ab: <0.1 s/co ratio (ref 0.0–0.9)

## 2016-08-14 LAB — HIV ANTIBODY (ROUTINE TESTING W REFLEX): HIV Screen 4th Generation wRfx: NONREACTIVE

## 2016-08-14 LAB — HEMOGLOBIN A1C
ESTIMATED AVERAGE GLUCOSE: 128 mg/dL
Hgb A1c MFr Bld: 6.1 % — ABNORMAL HIGH (ref 4.8–5.6)

## 2016-08-15 NOTE — Telephone Encounter (Signed)
finished

## 2016-08-20 ENCOUNTER — Encounter: Payer: Self-pay | Admitting: Family Medicine

## 2016-09-20 ENCOUNTER — Telehealth: Payer: Self-pay | Admitting: Family Medicine

## 2016-09-20 NOTE — Telephone Encounter (Signed)
Patient says he has been trying to switch to [facility] for his pain medication, but [facility] has not sent his records to [place] yet.  He said he signed a release 3 weeks ago.  He says [facility] will not give him medication until they receive those records and he is currently out of his Oxycodone 15 mg.  He wants to know if Dr. Nicki Reaper would be willing to give him this medication until he can get everything situated with [facility].

## 2016-09-20 NOTE — Telephone Encounter (Signed)
Discussed with pt. Pt verbalized understanding.  °

## 2016-09-20 NOTE — Telephone Encounter (Signed)
I cannot

## 2016-10-05 ENCOUNTER — Other Ambulatory Visit (HOSPITAL_BASED_OUTPATIENT_CLINIC_OR_DEPARTMENT_OTHER): Payer: Self-pay

## 2016-10-05 DIAGNOSIS — R0683 Snoring: Secondary | ICD-10-CM

## 2016-10-05 DIAGNOSIS — G4710 Hypersomnia, unspecified: Secondary | ICD-10-CM

## 2016-10-05 DIAGNOSIS — G4730 Sleep apnea, unspecified: Secondary | ICD-10-CM

## 2016-10-26 ENCOUNTER — Ambulatory Visit: Payer: Medicaid Other | Attending: Nurse Practitioner | Admitting: Neurology

## 2016-10-26 DIAGNOSIS — Z79899 Other long term (current) drug therapy: Secondary | ICD-10-CM | POA: Diagnosis not present

## 2016-10-26 DIAGNOSIS — G4733 Obstructive sleep apnea (adult) (pediatric): Secondary | ICD-10-CM | POA: Insufficient documentation

## 2016-10-26 DIAGNOSIS — R0683 Snoring: Secondary | ICD-10-CM

## 2016-10-26 DIAGNOSIS — Z7984 Long term (current) use of oral hypoglycemic drugs: Secondary | ICD-10-CM | POA: Diagnosis not present

## 2016-10-26 DIAGNOSIS — G4710 Hypersomnia, unspecified: Secondary | ICD-10-CM

## 2016-10-26 DIAGNOSIS — G4730 Sleep apnea, unspecified: Secondary | ICD-10-CM | POA: Diagnosis present

## 2016-11-02 NOTE — Procedures (Signed)
[place] A. Merlene Laughter, MD     [email or web address]             NOCTURNAL POLYSOMNOGRAPHY   LOCATION: [facility]   Patient Name: Jim [name], Jim [name] Date: 10/26/2016 Gender: Male D.O.B: [birth year] Age (years): 60 Referring Provider: Joyce Copa Height (inches): 69 Interpreting Physician: Phillips Odor MD, ABSM Weight (lbs): 230 RPSGT: Rosebud Poles BMI: 34 MRN: [id number] Neck Size: 19.00 CLINICAL INFORMATION Sleep Study Type: Split Night CPAP  Indication for sleep study: N/A  Epworth Sleepiness Score: 13  SLEEP STUDY TECHNIQUE As per the AASM Manual for the Scoring of Sleep and Associated Events v2.3 (April 2016) with a hypopnea requiring 4% desaturations.  The channels recorded and monitored were frontal, central and occipital EEG, electrooculogram (EOG), submentalis EMG (chin), nasal and oral airflow, thoracic and abdominal wall motion, anterior tibialis EMG, snore microphone, electrocardiogram, and pulse oximetry. Continuous positive airway pressure (CPAP) was initiated when the patient met split night criteria and was titrated according to treat sleep-disordered breathing.  MEDICATIONS Medications self-administered by patient taken the night of the study : N/A  Current Outpatient Prescriptions:  .  acetaminophen (TYLENOL) 325 MG tablet, Take 2 tablets (650 mg total) by mouth every 6 (six) hours as needed for mild pain (or Fever >/= 101)., Disp: , Rfl:  .  albuterol (PROVENTIL HFA;VENTOLIN HFA) 108 (90 Base) MCG/ACT inhaler, Inhale 2 puffs into the lungs every 6 (six) hours as needed for wheezing., Disp: 1 Inhaler, Rfl: 6 .  metFORMIN (GLUCOPHAGE) 500 MG tablet, Take 0.5 tablets (250 mg total) by mouth 2 (two) times daily with a meal., Disp: 30 tablet, Rfl: 6 .  Multiple Vitamin (MULTIVITAMIN WITH MINERALS) TABS, Take 1 tablet by mouth daily., Disp: , Rfl:  .  oxyCODONE (ROXICODONE) 15 MG immediate release tablet, 1 tablet every 3 hrs for break  through pain.  DO NOT TAKE AT THE SAME TIME AS MSCONTIN.   MAX OF 6 PILLS A DAY. THIS SCRIPT MUST LAST UNTIL APPT AT [facility] ON 08/13/2015, Disp: 120 tablet, Rfl: 0 .  sertraline (ZOLOFT) 100 MG tablet, Take 1 tablet (100 mg total) by mouth daily., Disp: 30 tablet, Rfl: 6 .  tiZANidine (ZANAFLEX) 4 MG tablet, Take 4 mg by mouth every 8 (eight) hours., Disp: , Rfl: 0 .  traZODone (DESYREL) 50 MG tablet, 1 each evening, Disp: 30 tablet, Rfl: 6   RESPIRATORY PARAMETERS Diagnostic  Total AHI (/hr): 17.9 RDI (/hr): 18.3 OA Index (/hr): 1.2 CA Index (/hr): 0.0 REM AHI (/hr): N/A NREM AHI (/hr): 17.9 Supine AHI (/hr): 35.5 Non-supine AHI (/hr): 4.25 Min O2 Sat (%): 83.00 Mean O2 (%): 90.27 Time below 88% (min): 15.1   Titration  Optimal Pressure (cm): 10 AHI at Optimal Pressure (/hr): 0.9 Min O2 at Optimal Pressure (%): 91.0 Supine % at Optimal (%): 47 Sleep % at Optimal (%): 95   SLEEP ARCHITECTURE The recording time for the entire night was 398.9 minutes.  During a baseline period of 157.4 minutes, the patient slept for 150.5 minutes in REM and nonREM, yielding a sleep efficiency of 95.6%. Sleep onset after lights out was 2.3 minutes with a REM latency of N/A minutes. The patient spent 2.33% of the night in stage N1 sleep, 61.79% in stage N2 sleep, 35.88% in stage N3 and 0.00% in REM.  During the titration period of 231.3 minutes, the patient slept for 218.2 minutes in REM and nonREM, yielding a sleep efficiency of 94.3%. Sleep onset after CPAP initiation was  0.2 minutes with a REM latency of 53.0 minutes. The patient spent 3.05% of the night in stage N1 sleep, 70.82% in stage N2 sleep, 3.21% in stage N3 and 22.92% in REM.  CARDIAC DATA The 2 lead EKG demonstrated sinus rhythm. The mean heart rate was N/A beats per minute. Other EKG findings include: None. LEG MOVEMENT DATA The total Periodic Limb Movements of Sleep (PLMS) were 0. The PLMS index was 0.00.  IMPRESSIONS - Moderate  obstructive sleep apnea occurred during the diagnostic portion of the study(AHI = 17.9/hour). An optimal CPAP was selected for this patient ( 10 cm of water) - Clinically significant periodic limb movements did not occur during sleep.  Delano Metz, MD Diplomate, American Board of Sleep Medicine.  ELECTRONICALLY SIGNED ON:  11/02/2016, 10:47 AM [facility] PH: [phone]   FX: [phone] [place]

## 2016-11-05 ENCOUNTER — Ambulatory Visit: Payer: Medicaid Other | Admitting: Family Medicine

## 2016-11-24 ENCOUNTER — Ambulatory Visit: Payer: Medicaid Other | Admitting: Family Medicine

## 2016-11-29 ENCOUNTER — Ambulatory Visit (INDEPENDENT_AMBULATORY_CARE_PROVIDER_SITE_OTHER): Payer: Medicaid Other | Admitting: Family Medicine

## 2016-11-29 ENCOUNTER — Encounter: Payer: Self-pay | Admitting: Family Medicine

## 2016-11-29 VITALS — BP 142/98 | Ht 69.0 in | Wt 232.0 lb

## 2016-11-29 DIAGNOSIS — E119 Type 2 diabetes mellitus without complications: Secondary | ICD-10-CM

## 2016-11-29 DIAGNOSIS — R030 Elevated blood-pressure reading, without diagnosis of hypertension: Secondary | ICD-10-CM

## 2016-11-29 LAB — POCT GLYCOSYLATED HEMOGLOBIN (HGB A1C): Hemoglobin A1C: 5.7

## 2016-11-29 NOTE — Patient Instructions (Signed)
DASH Eating Plan DASH stands for "Dietary Approaches to Stop Hypertension." The DASH eating plan is a healthy eating plan that has been shown to reduce high blood pressure (hypertension). It may also reduce your risk for type 2 diabetes, heart disease, and stroke. The DASH eating plan may also help with weight loss. What are tips for following this plan? General guidelines  Avoid eating more than 2,300 mg (milligrams) of salt (sodium) a day. If you have hypertension, you may need to reduce your sodium intake to 1,500 mg a day.  Limit alcohol intake to no more than 1 drink a day for nonpregnant women and 2 drinks a day for men. One drink equals 12 oz of beer, 5 oz of wine, or 1 oz of hard liquor.  Work with your health care provider to maintain a healthy body weight or to lose weight. Ask what an ideal weight is for you.  Get at least 30 minutes of exercise that causes your heart to beat faster (aerobic exercise) most days of the week. Activities may include walking, swimming, or biking.  Work with your health care provider or diet and nutrition specialist (dietitian) to adjust your eating plan to your individual calorie needs. Reading food labels  Check food labels for the amount of sodium per serving. Choose foods with less than 5 percent of the Daily Value of sodium. Generally, foods with less than 300 mg of sodium per serving fit into this eating plan.  To find whole grains, look for the word "whole" as the first word in the ingredient list. Shopping  Buy products labeled as "low-sodium" or "no salt added."  Buy fresh foods. Avoid canned foods and premade or frozen meals. Cooking  Avoid adding salt when cooking. Use salt-free seasonings or herbs instead of table salt or sea salt. Check with your health care provider or pharmacist before using salt substitutes.  Do not fry foods. Cook foods using healthy methods such as baking, boiling, grilling, and broiling instead.  Cook with  heart-healthy oils, such as olive, canola, soybean, or sunflower oil. Meal planning   Eat a balanced diet that includes: ? 5 or more servings of fruits and vegetables each day. At each meal, try to fill half of your plate with fruits and vegetables. ? Up to 6-8 servings of whole grains each day. ? Less than 6 oz of lean meat, poultry, or fish each day. A 3-oz serving of meat is about the same size as a deck of cards. One egg equals 1 oz. ? 2 servings of low-fat dairy each day. ? A serving of nuts, seeds, or beans 5 times each week. ? Heart-healthy fats. Healthy fats called Omega-3 fatty acids are found in foods such as flaxseeds and coldwater fish, like sardines, salmon, and mackerel.  Limit how much you eat of the following: ? Canned or prepackaged foods. ? Food that is high in trans fat, such as fried foods. ? Food that is high in saturated fat, such as fatty meat. ? Sweets, desserts, sugary drinks, and other foods with added sugar. ? Full-fat dairy products.  Do not salt foods before eating.  Try to eat at least 2 vegetarian meals each week.  Eat more home-cooked food and less restaurant, buffet, and fast food.  When eating at a restaurant, ask that your food be prepared with less salt or no salt, if possible. What foods are recommended? The items listed may not be a complete list. Talk with your dietitian about what   dietary choices are best for you. Grains Whole-grain or whole-wheat bread. Whole-grain or whole-wheat pasta. Brown rice. Oatmeal. Quinoa. Bulgur. Whole-grain and low-sodium cereals. Pita bread. Low-fat, low-sodium crackers. Whole-wheat flour tortillas. Vegetables Fresh or frozen vegetables (raw, steamed, roasted, or grilled). Low-sodium or reduced-sodium tomato and vegetable juice. Low-sodium or reduced-sodium tomato sauce and tomato paste. Low-sodium or reduced-sodium canned vegetables. Fruits All fresh, dried, or frozen fruit. Canned fruit in natural juice (without  added sugar). Meat and other protein foods Skinless chicken or turkey. Ground chicken or turkey. Pork with fat trimmed off. Fish and seafood. Egg whites. Dried beans, peas, or lentils. Unsalted nuts, nut butters, and seeds. Unsalted canned beans. Lean cuts of beef with fat trimmed off. Low-sodium, lean deli meat. Dairy Low-fat (1%) or fat-free (skim) milk. Fat-free, low-fat, or reduced-fat cheeses. Nonfat, low-sodium ricotta or cottage cheese. Low-fat or nonfat yogurt. Low-fat, low-sodium cheese. Fats and oils Soft margarine without trans fats. Vegetable oil. Low-fat, reduced-fat, or light mayonnaise and salad dressings (reduced-sodium). Canola, safflower, olive, soybean, and sunflower oils. Avocado. Seasoning and other foods Herbs. Spices. Seasoning mixes without salt. Unsalted popcorn and pretzels. Fat-free sweets. What foods are not recommended? The items listed may not be a complete list. Talk with your dietitian about what dietary choices are best for you. Grains Baked goods made with fat, such as croissants, muffins, or some breads. Dry pasta or rice meal packs. Vegetables Creamed or fried vegetables. Vegetables in a cheese sauce. Regular canned vegetables (not low-sodium or reduced-sodium). Regular canned tomato sauce and paste (not low-sodium or reduced-sodium). Regular tomato and vegetable juice (not low-sodium or reduced-sodium). Pickles. Olives. Fruits Canned fruit in a light or heavy syrup. Fried fruit. Fruit in cream or butter sauce. Meat and other protein foods Fatty cuts of meat. Ribs. Fried meat. Bacon. Sausage. Bologna and other processed lunch meats. Salami. Fatback. Hotdogs. Bratwurst. Salted nuts and seeds. Canned beans with added salt. Canned or smoked fish. Whole eggs or egg yolks. Chicken or turkey with skin. Dairy Whole or 2% milk, cream, and half-and-half. Whole or full-fat cream cheese. Whole-fat or sweetened yogurt. Full-fat cheese. Nondairy creamers. Whipped toppings.  Processed cheese and cheese spreads. Fats and oils Butter. Stick margarine. Lard. Shortening. Ghee. Bacon fat. Tropical oils, such as coconut, palm kernel, or palm oil. Seasoning and other foods Salted popcorn and pretzels. Onion salt, garlic salt, seasoned salt, table salt, and sea salt. Worcestershire sauce. Tartar sauce. Barbecue sauce. Teriyaki sauce. Soy sauce, including reduced-sodium. Steak sauce. Canned and packaged gravies. Fish sauce. Oyster sauce. Cocktail sauce. Horseradish that you find on the shelf. Ketchup. Mustard. Meat flavorings and tenderizers. Bouillon cubes. Hot sauce and Tabasco sauce. Premade or packaged marinades. Premade or packaged taco seasonings. Relishes. Regular salad dressings. Where to find more information:  National Heart, Lung, and Blood Institute: www.nhlbi.nih.gov  American Heart Association: www.heart.org Summary  The DASH eating plan is a healthy eating plan that has been shown to reduce high blood pressure (hypertension). It may also reduce your risk for type 2 diabetes, heart disease, and stroke.  With the DASH eating plan, you should limit salt (sodium) intake to 2,300 mg a day. If you have hypertension, you may need to reduce your sodium intake to 1,500 mg a day.  When on the DASH eating plan, aim to eat more fresh fruits and vegetables, whole grains, lean proteins, low-fat dairy, and heart-healthy fats.  Work with your health care provider or diet and nutrition specialist (dietitian) to adjust your eating plan to your individual   calorie needs. This information is not intended to replace advice given to you by your health care provider. Make sure you discuss any questions you have with your health care provider. Document Released: 07/08/2011 Document Revised: 07/12/2016 Document Reviewed: 07/12/2016 Elsevier Interactive Patient Education  2017 Elsevier Inc.  

## 2016-11-29 NOTE — Progress Notes (Signed)
   Subjective:    Patient ID: Jim [name], male    DOB: 01-May-1956, 61 y.o.   MRN: [id number]  Diabetes  He presents for his follow-up diabetic visit. He has type 2 diabetes mellitus. Current diabetic treatment includes oral agent (monotherapy). He is compliant with treatment all of the time. Home blood sugar record trend: does not check blood sugar. He does not see a podiatrist.Eye exam is not current.  A1C today 5.7  Sees [facility] for pain management now.  His pain medicines are through the pain management  Review of Systems Denies chest tightness pressure pain shortness of breath denies numbness tingling    Objective:   Physical Exam Neck no masses lungs clear no crackles heart regular pulse normal abdomen soft extremities no edema blood pressure mildly elevated       Assessment & Plan:  Diabetes very good control A1c 5.7 watch diet continue current measures  Chronic pain via pain management  Elevated blood pressure healthy diet recommended information given regular physical activity recheck blood pressure in a few weeks if still up will start medication

## 2016-12-21 ENCOUNTER — Encounter: Payer: Self-pay | Admitting: Family Medicine

## 2016-12-21 ENCOUNTER — Ambulatory Visit (INDEPENDENT_AMBULATORY_CARE_PROVIDER_SITE_OTHER): Payer: Medicaid Other | Admitting: Family Medicine

## 2016-12-21 VITALS — BP 138/86 | Ht 69.0 in | Wt 235.4 lb

## 2016-12-21 DIAGNOSIS — E784 Other hyperlipidemia: Secondary | ICD-10-CM

## 2016-12-21 DIAGNOSIS — I10 Essential (primary) hypertension: Secondary | ICD-10-CM | POA: Diagnosis not present

## 2016-12-21 DIAGNOSIS — R0609 Other forms of dyspnea: Secondary | ICD-10-CM

## 2016-12-21 DIAGNOSIS — E7849 Other hyperlipidemia: Secondary | ICD-10-CM

## 2016-12-21 MED ORDER — LISINOPRIL 5 MG PO TABS: 5.0000 mg | ORAL_TABLET | Freq: Every day | ORAL | 5 refills | Status: DC

## 2016-12-21 NOTE — Progress Notes (Signed)
   Subjective:    Patient ID: Jim [name], male    DOB: 1956/03/28, 61 y.o.   MRN: [id number]  HPI Patient arrives for a recheck of blood pressure. Blood pressure was elevated at last visit. This patient relates that he is try to watch salt in his diet try to be a little more active but he denies being able to dramatically increase activity Arthritis keeps him back Patient does relate significant trouble with his pain will be following up with pain specialist Occasionally gets short of breath when he sitting still sometimes with walking but it only last a couple seconds no chest pressure tightness with it  Review of Systems  Constitutional: Negative for activity change, fatigue and fever.  Respiratory: Negative for cough and shortness of breath.   Cardiovascular: Negative for chest pain and leg swelling.  Neurological: Negative for headaches.       Objective:   Physical Exam  Constitutional: He appears well-nourished. No distress.  Cardiovascular: Normal rate, regular rhythm and normal heart sounds.   No murmur heard. Pulmonary/Chest: Effort normal and breath sounds normal. No respiratory distress.  Musculoskeletal: He exhibits no edema.  Lymphadenopathy:    He has no cervical adenopathy.  Neurological: He is alert.  Psychiatric: His behavior is normal.  Vitals reviewed.         Assessment & Plan:  Dyspnea-given that it only last a couple seconds and goes away on its own I really doubt that this is a sign of any problem but patient was encouraged to do a little more physical activity if he is finding himself short of breath with activity he needs to let us know we would have to run further tests EKG looks good no sign of ischemia, no sign of LVH HTN-blood pressure overall elevated. Lisinopril 5 mg daily, side effects discussed, follow-up in 6 weeks to recheck, recheck lipid profile as well as metabolic 7 in 2-3 weeks

## 2017-02-03 ENCOUNTER — Ambulatory Visit: Payer: Medicaid Other | Admitting: Family Medicine

## 2017-02-09 ENCOUNTER — Ambulatory Visit (INDEPENDENT_AMBULATORY_CARE_PROVIDER_SITE_OTHER): Payer: Medicaid Other | Admitting: Family Medicine

## 2017-02-09 ENCOUNTER — Encounter: Payer: Self-pay | Admitting: Family Medicine

## 2017-02-09 VITALS — BP 120/80 | Ht 69.0 in | Wt 225.8 lb

## 2017-02-09 DIAGNOSIS — M25541 Pain in joints of right hand: Secondary | ICD-10-CM

## 2017-02-09 DIAGNOSIS — I10 Essential (primary) hypertension: Secondary | ICD-10-CM

## 2017-02-09 DIAGNOSIS — M25542 Pain in joints of left hand: Secondary | ICD-10-CM

## 2017-02-09 MED ORDER — ALBUTEROL SULFATE HFA 108 (90 BASE) MCG/ACT IN AERS: 2.0000 | INHALATION_SPRAY | Freq: Four times a day (QID) | RESPIRATORY_TRACT | 6 refills | Status: DC | PRN

## 2017-02-09 MED ORDER — METFORMIN HCL 500 MG PO TABS: 250.0000 mg | ORAL_TABLET | Freq: Two times a day (BID) | ORAL | 6 refills | Status: DC

## 2017-02-09 NOTE — Progress Notes (Signed)
   Subjective:    Patient ID: Jim [name], male    DOB: 1955-10-12, 61 y.o.   MRN: [id number]  Hypertension  This is a chronic problem. The current episode started more than 1 year ago. Pertinent negatives include no chest pain, headaches or shortness of breath. Risk factors for coronary artery disease include dyslipidemia and male gender. Treatments tried: lisinopril. There are no compliance problems.    Patient has not had his blood work yet   Review of Systems  Constitutional: Negative for activity change, fatigue and fever.  Respiratory: Negative for cough and shortness of breath.   Cardiovascular: Negative for chest pain and leg swelling.  Musculoskeletal: Positive for arthralgias.  Neurological: Negative for headaches.       Objective:   Physical Exam  Constitutional: He appears well-nourished. No distress.  Cardiovascular: Normal rate, regular rhythm and normal heart sounds.   No murmur heard. Pulmonary/Chest: Effort normal and breath sounds normal. No respiratory distress.  Musculoskeletal: He exhibits no edema.  Lymphadenopathy:    He has no cervical adenopathy.  Neurological: He is alert.  Psychiatric: His behavior is normal.  Vitals reviewed.         Assessment & Plan:  HTN overall good control continue current measures. Watch diet continue to try to stay active lose weight  Arthralgia both hands I doubt it's rheumatoid I think it's more osteoarthritis but we will check lab work  Patient is not due for any lab work currently. Patient is followed by pain medicine

## 2017-02-09 NOTE — Patient Instructions (Signed)
DASH Eating Plan DASH stands for "Dietary Approaches to Stop Hypertension." The DASH eating plan is a healthy eating plan that has been shown to reduce high blood pressure (hypertension). It may also reduce your risk for type 2 diabetes, heart disease, and stroke. The DASH eating plan may also help with weight loss. What are tips for following this plan? General guidelines  Avoid eating more than 2,300 mg (milligrams) of salt (sodium) a day. If you have hypertension, you may need to reduce your sodium intake to 1,500 mg a day.  Limit alcohol intake to no more than 1 drink a day for nonpregnant women and 2 drinks a day for men. One drink equals 12 oz of beer, 5 oz of wine, or 1 oz of hard liquor.  Work with your health care provider to maintain a healthy body weight or to lose weight. Ask what an ideal weight is for you.  Get at least 30 minutes of exercise that causes your heart to beat faster (aerobic exercise) most days of the week. Activities may include walking, swimming, or biking.  Work with your health care provider or diet and nutrition specialist (dietitian) to adjust your eating plan to your individual calorie needs. Reading food labels  Check food labels for the amount of sodium per serving. Choose foods with less than 5 percent of the Daily Value of sodium. Generally, foods with less than 300 mg of sodium per serving fit into this eating plan.  To find whole grains, look for the word "whole" as the first word in the ingredient list. Shopping  Buy products labeled as "low-sodium" or "no salt added."  Buy fresh foods. Avoid canned foods and premade or frozen meals. Cooking  Avoid adding salt when cooking. Use salt-free seasonings or herbs instead of table salt or sea salt. Check with your health care provider or pharmacist before using salt substitutes.  Do not fry foods. Cook foods using healthy methods such as baking, boiling, grilling, and broiling instead.  Cook with  heart-healthy oils, such as olive, canola, soybean, or sunflower oil. Meal planning   Eat a balanced diet that includes: ? 5 or more servings of fruits and vegetables each day. At each meal, try to fill half of your plate with fruits and vegetables. ? Up to 6-8 servings of whole grains each day. ? Less than 6 oz of lean meat, poultry, or fish each day. A 3-oz serving of meat is about the same size as a deck of cards. One egg equals 1 oz. ? 2 servings of low-fat dairy each day. ? A serving of nuts, seeds, or beans 5 times each week. ? Heart-healthy fats. Healthy fats called Omega-3 fatty acids are found in foods such as flaxseeds and coldwater fish, like sardines, salmon, and mackerel.  Limit how much you eat of the following: ? Canned or prepackaged foods. ? Food that is high in trans fat, such as fried foods. ? Food that is high in saturated fat, such as fatty meat. ? Sweets, desserts, sugary drinks, and other foods with added sugar. ? Full-fat dairy products.  Do not salt foods before eating.  Try to eat at least 2 vegetarian meals each week.  Eat more home-cooked food and less restaurant, buffet, and fast food.  When eating at a restaurant, ask that your food be prepared with less salt or no salt, if possible. What foods are recommended? The items listed may not be a complete list. Talk with your dietitian about what   dietary choices are best for you. Grains Whole-grain or whole-wheat bread. Whole-grain or whole-wheat pasta. Brown rice. Oatmeal. Quinoa. Bulgur. Whole-grain and low-sodium cereals. Pita bread. Low-fat, low-sodium crackers. Whole-wheat flour tortillas. Vegetables Fresh or frozen vegetables (raw, steamed, roasted, or grilled). Low-sodium or reduced-sodium tomato and vegetable juice. Low-sodium or reduced-sodium tomato sauce and tomato paste. Low-sodium or reduced-sodium canned vegetables. Fruits All fresh, dried, or frozen fruit. Canned fruit in natural juice (without  added sugar). Meat and other protein foods Skinless chicken or turkey. Ground chicken or turkey. Pork with fat trimmed off. Fish and seafood. Egg whites. Dried beans, peas, or lentils. Unsalted nuts, nut butters, and seeds. Unsalted canned beans. Lean cuts of beef with fat trimmed off. Low-sodium, lean deli meat. Dairy Low-fat (1%) or fat-free (skim) milk. Fat-free, low-fat, or reduced-fat cheeses. Nonfat, low-sodium ricotta or cottage cheese. Low-fat or nonfat yogurt. Low-fat, low-sodium cheese. Fats and oils Soft margarine without trans fats. Vegetable oil. Low-fat, reduced-fat, or light mayonnaise and salad dressings (reduced-sodium). Canola, safflower, olive, soybean, and sunflower oils. Avocado. Seasoning and other foods Herbs. Spices. Seasoning mixes without salt. Unsalted popcorn and pretzels. Fat-free sweets. What foods are not recommended? The items listed may not be a complete list. Talk with your dietitian about what dietary choices are best for you. Grains Baked goods made with fat, such as croissants, muffins, or some breads. Dry pasta or rice meal packs. Vegetables Creamed or fried vegetables. Vegetables in a cheese sauce. Regular canned vegetables (not low-sodium or reduced-sodium). Regular canned tomato sauce and paste (not low-sodium or reduced-sodium). Regular tomato and vegetable juice (not low-sodium or reduced-sodium). Pickles. Olives. Fruits Canned fruit in a light or heavy syrup. Fried fruit. Fruit in cream or butter sauce. Meat and other protein foods Fatty cuts of meat. Ribs. Fried meat. Bacon. Sausage. Bologna and other processed lunch meats. Salami. Fatback. Hotdogs. Bratwurst. Salted nuts and seeds. Canned beans with added salt. Canned or smoked fish. Whole eggs or egg yolks. Chicken or turkey with skin. Dairy Whole or 2% milk, cream, and half-and-half. Whole or full-fat cream cheese. Whole-fat or sweetened yogurt. Full-fat cheese. Nondairy creamers. Whipped toppings.  Processed cheese and cheese spreads. Fats and oils Butter. Stick margarine. Lard. Shortening. Ghee. Bacon fat. Tropical oils, such as coconut, palm kernel, or palm oil. Seasoning and other foods Salted popcorn and pretzels. Onion salt, garlic salt, seasoned salt, table salt, and sea salt. Worcestershire sauce. Tartar sauce. Barbecue sauce. Teriyaki sauce. Soy sauce, including reduced-sodium. Steak sauce. Canned and packaged gravies. Fish sauce. Oyster sauce. Cocktail sauce. Horseradish that you find on the shelf. Ketchup. Mustard. Meat flavorings and tenderizers. Bouillon cubes. Hot sauce and Tabasco sauce. Premade or packaged marinades. Premade or packaged taco seasonings. Relishes. Regular salad dressings. Where to find more information:  National Heart, Lung, and Blood Institute: www.nhlbi.nih.gov  American Heart Association: www.heart.org Summary  The DASH eating plan is a healthy eating plan that has been shown to reduce high blood pressure (hypertension). It may also reduce your risk for type 2 diabetes, heart disease, and stroke.  With the DASH eating plan, you should limit salt (sodium) intake to 2,300 mg a day. If you have hypertension, you may need to reduce your sodium intake to 1,500 mg a day.  When on the DASH eating plan, aim to eat more fresh fruits and vegetables, whole grains, lean proteins, low-fat dairy, and heart-healthy fats.  Work with your health care provider or diet and nutrition specialist (dietitian) to adjust your eating plan to your individual   calorie needs. This information is not intended to replace advice given to you by your health care provider. Make sure you discuss any questions you have with your health care provider. Document Released: 07/08/2011 Document Revised: 07/12/2016 Document Reviewed: 07/12/2016 Elsevier Interactive Patient Education  2017 Elsevier Inc.  

## 2017-02-10 ENCOUNTER — Encounter: Payer: Self-pay | Admitting: Family Medicine

## 2017-02-10 LAB — RHEUMATOID FACTOR

## 2017-02-10 LAB — SEDIMENTATION RATE: Sed Rate: 2 mm/hr (ref 0–30)

## 2017-02-12 LAB — BASIC METABOLIC PANEL
BUN/Creatinine Ratio: 14 (ref 10–24)
BUN: 14 mg/dL (ref 8–27)
CHLORIDE: 107 mmol/L — AB (ref 96–106)
CO2: 23 mmol/L (ref 20–29)
Calcium: 9 mg/dL (ref 8.6–10.2)
Creatinine, Ser: 0.98 mg/dL (ref 0.76–1.27)
GFR calc Af Amer: 95 mL/min/{1.73_m2} (ref >59)
GFR calc non Af Amer: 82 mL/min/{1.73_m2} (ref >59)
GLUCOSE: 114 mg/dL — AB (ref 65–99)
POTASSIUM: 4.5 mmol/L (ref 3.5–5.2)
SODIUM: 145 mmol/L — AB (ref 134–144)

## 2017-02-12 LAB — LIPID PANEL
CHOL/HDL RATIO: 4.7 ratio (ref 0.0–5.0)
Cholesterol, Total: 169 mg/dL (ref 100–199)
HDL: 36 mg/dL — ABNORMAL LOW (ref >39)
LDL CALC: 110 mg/dL — AB (ref 0–99)
Triglycerides: 117 mg/dL (ref 0–149)
VLDL CHOLESTEROL CAL: 23 mg/dL (ref 5–40)

## 2017-02-14 ENCOUNTER — Other Ambulatory Visit: Payer: Self-pay

## 2017-02-14 MED ORDER — EZETIMIBE 10 MG PO TABS: 10.0000 mg | ORAL_TABLET | Freq: Every day | ORAL | 1 refills | Status: DC

## 2017-05-18 ENCOUNTER — Telehealth: Payer: Self-pay | Admitting: Family Medicine

## 2017-05-18 DIAGNOSIS — R52 Pain, unspecified: Secondary | ICD-10-CM

## 2017-05-18 NOTE — Telephone Encounter (Signed)
Left message return call 05/18/17 ( referral in epic)

## 2017-05-18 NOTE — Telephone Encounter (Signed)
Please go ahead with referral for pain management Dr. Josefa Half

## 2017-05-18 NOTE — Telephone Encounter (Signed)
Pt called & requested referral to a new pain management specialist Pt states he'd like to see Dr. Primus Bravo [phone])  Please initiate referral so I may process

## 2017-06-09 ENCOUNTER — Encounter: Payer: Self-pay | Admitting: Family Medicine

## 2017-06-09 ENCOUNTER — Ambulatory Visit: Payer: Medicaid Other | Admitting: Family Medicine

## 2017-06-09 VITALS — BP 132/82 | Ht 69.0 in | Wt 234.8 lb

## 2017-06-09 DIAGNOSIS — Z23 Encounter for immunization: Secondary | ICD-10-CM | POA: Diagnosis not present

## 2017-06-09 DIAGNOSIS — M25552 Pain in left hip: Secondary | ICD-10-CM | POA: Diagnosis not present

## 2017-06-09 DIAGNOSIS — G4700 Insomnia, unspecified: Secondary | ICD-10-CM

## 2017-06-09 DIAGNOSIS — M25551 Pain in right hip: Secondary | ICD-10-CM

## 2017-06-09 DIAGNOSIS — M1990 Unspecified osteoarthritis, unspecified site: Secondary | ICD-10-CM

## 2017-06-09 DIAGNOSIS — R7303 Prediabetes: Secondary | ICD-10-CM

## 2017-06-09 DIAGNOSIS — I10 Essential (primary) hypertension: Secondary | ICD-10-CM

## 2017-06-09 DIAGNOSIS — E7849 Other hyperlipidemia: Secondary | ICD-10-CM | POA: Diagnosis not present

## 2017-06-09 MED ORDER — METFORMIN HCL 500 MG PO TABS: 250.0000 mg | ORAL_TABLET | Freq: Two times a day (BID) | ORAL | 6 refills | Status: DC

## 2017-06-09 MED ORDER — LISINOPRIL 5 MG PO TABS: 5.0000 mg | ORAL_TABLET | Freq: Every day | ORAL | 5 refills | Status: DC

## 2017-06-09 MED ORDER — ALBUTEROL SULFATE HFA 108 (90 BASE) MCG/ACT IN AERS: 2.0000 | INHALATION_SPRAY | Freq: Four times a day (QID) | RESPIRATORY_TRACT | 6 refills | Status: DC | PRN

## 2017-06-09 MED ORDER — EZETIMIBE 10 MG PO TABS: 10.0000 mg | ORAL_TABLET | Freq: Every day | ORAL | 1 refills | Status: DC

## 2017-06-09 MED ORDER — TRAZODONE HCL 50 MG PO TABS: ORAL_TABLET | ORAL | 6 refills | Status: DC

## 2017-06-09 NOTE — Progress Notes (Signed)
   Subjective:    Patient ID: Jim [name], male    DOB: 09-Jul-1956, 61 y.o.   MRN: [id number]  Hypertension  This is a chronic problem. The current episode started more than 1 year ago. Pertinent negatives include no chest pain. Risk factors for coronary artery disease include male gender. Treatments tried: lisinopril. There are no compliance problems.    Patient with significant arthritic issues he does go to pain management.  He does take anti-inflammatories on a as needed basis.  Patient with cholesterol issues tries to manage it with diet and taking Zetia does fair  Patient with depression issues states is under good control follows with psychiatry  Has insomnia issues but takes trazodone at night to help with that   Review of Systems  Constitutional: Negative for activity change, appetite change and fatigue.  HENT: Negative for congestion.   Respiratory: Negative for cough.   Cardiovascular: Negative for chest pain.  Gastrointestinal: Negative for abdominal pain.  Endocrine: Negative for polydipsia and polyphagia.  Neurological: Negative for weakness.  Psychiatric/Behavioral: Negative for confusion.       Objective:   Physical Exam  Constitutional: He appears well-nourished. No distress.  Cardiovascular: Normal rate, regular rhythm and normal heart sounds.  No murmur heard. Pulmonary/Chest: Effort normal and breath sounds normal. No respiratory distress.  Musculoskeletal: He exhibits no edema.  Lymphadenopathy:    He has no cervical adenopathy.  Neurological: He is alert.  Psychiatric: His behavior is normal.  Vitals reviewed.        25 minutes was spent with the patient. Greater than half the time was spent in discussion and answering questions and counseling regarding the issues that the patient came in for today.  Assessment & Plan:  HTN decent control watch diet stay physically active try to lose weight  Significant arthritis with hip pain  anti-inflammatories as needed he is also on pain management by specialist x-ray of the hip indicated  Insomnia-I would not recommend anything other than trazodone because of risk of sedation  Prediabetes watch diet cut back starches try to get weight below 220  Hyperlipidemia previous labs reviewed patient is to watch diet closely continue Zetia  Hip pain probably arthritis check x-ray of the right hip that is where it is at its worse

## 2017-06-09 NOTE — Patient Instructions (Signed)
DASH Eating Plan DASH stands for "Dietary Approaches to Stop Hypertension." The DASH eating plan is a healthy eating plan that has been shown to reduce high blood pressure (hypertension). It may also reduce your risk for type 2 diabetes, heart disease, and stroke. The DASH eating plan may also help with weight loss. What are tips for following this plan? General guidelines  Avoid eating more than 2,300 mg (milligrams) of salt (sodium) a day. If you have hypertension, you may need to reduce your sodium intake to 1,500 mg a day.  Limit alcohol intake to no more than 1 drink a day for nonpregnant women and 2 drinks a day for men. One drink equals 12 oz of beer, 5 oz of wine, or 1 oz of hard liquor.  Work with your health care provider to maintain a healthy body weight or to lose weight. Ask what an ideal weight is for you.  Get at least 30 minutes of exercise that causes your heart to beat faster (aerobic exercise) most days of the week. Activities may include walking, swimming, or biking.  Work with your health care provider or diet and nutrition specialist (dietitian) to adjust your eating plan to your individual calorie needs. Reading food labels  Check food labels for the amount of sodium per serving. Choose foods with less than 5 percent of the Daily Value of sodium. Generally, foods with less than 300 mg of sodium per serving fit into this eating plan.  To find whole grains, look for the word "whole" as the first word in the ingredient list. Shopping  Buy products labeled as "low-sodium" or "no salt added."  Buy fresh foods. Avoid canned foods and premade or frozen meals. Cooking  Avoid adding salt when cooking. Use salt-free seasonings or herbs instead of table salt or sea salt. Check with your health care provider or pharmacist before using salt substitutes.  Do not fry foods. Cook foods using healthy methods such as baking, boiling, grilling, and broiling instead.  Cook with  heart-healthy oils, such as olive, canola, soybean, or sunflower oil. Meal planning   Eat a balanced diet that includes: ? 5 or more servings of fruits and vegetables each day. At each meal, try to fill half of your plate with fruits and vegetables. ? Up to 6-8 servings of whole grains each day. ? Less than 6 oz of lean meat, poultry, or fish each day. A 3-oz serving of meat is about the same size as a deck of cards. One egg equals 1 oz. ? 2 servings of low-fat dairy each day. ? A serving of nuts, seeds, or beans 5 times each week. ? Heart-healthy fats. Healthy fats called Omega-3 fatty acids are found in foods such as flaxseeds and coldwater fish, like sardines, salmon, and mackerel.  Limit how much you eat of the following: ? Canned or prepackaged foods. ? Food that is high in trans fat, such as fried foods. ? Food that is high in saturated fat, such as fatty meat. ? Sweets, desserts, sugary drinks, and other foods with added sugar. ? Full-fat dairy products.  Do not salt foods before eating.  Try to eat at least 2 vegetarian meals each week.  Eat more home-cooked food and less restaurant, buffet, and fast food.  When eating at a restaurant, ask that your food be prepared with less salt or no salt, if possible. What foods are recommended? The items listed may not be a complete list. Talk with your dietitian about what   dietary choices are best for you. Grains Whole-grain or whole-wheat bread. Whole-grain or whole-wheat pasta. Brown rice. Oatmeal. Quinoa. Bulgur. Whole-grain and low-sodium cereals. Pita bread. Low-fat, low-sodium crackers. Whole-wheat flour tortillas. Vegetables Fresh or frozen vegetables (raw, steamed, roasted, or grilled). Low-sodium or reduced-sodium tomato and vegetable juice. Low-sodium or reduced-sodium tomato sauce and tomato paste. Low-sodium or reduced-sodium canned vegetables. Fruits All fresh, dried, or frozen fruit. Canned fruit in natural juice (without  added sugar). Meat and other protein foods Skinless chicken or turkey. Ground chicken or turkey. Pork with fat trimmed off. Fish and seafood. Egg whites. Dried beans, peas, or lentils. Unsalted nuts, nut butters, and seeds. Unsalted canned beans. Lean cuts of beef with fat trimmed off. Low-sodium, lean deli meat. Dairy Low-fat (1%) or fat-free (skim) milk. Fat-free, low-fat, or reduced-fat cheeses. Nonfat, low-sodium ricotta or cottage cheese. Low-fat or nonfat yogurt. Low-fat, low-sodium cheese. Fats and oils Soft margarine without trans fats. Vegetable oil. Low-fat, reduced-fat, or light mayonnaise and salad dressings (reduced-sodium). Canola, safflower, olive, soybean, and sunflower oils. Avocado. Seasoning and other foods Herbs. Spices. Seasoning mixes without salt. Unsalted popcorn and pretzels. Fat-free sweets. What foods are not recommended? The items listed may not be a complete list. Talk with your dietitian about what dietary choices are best for you. Grains Baked goods made with fat, such as croissants, muffins, or some breads. Dry pasta or rice meal packs. Vegetables Creamed or fried vegetables. Vegetables in a cheese sauce. Regular canned vegetables (not low-sodium or reduced-sodium). Regular canned tomato sauce and paste (not low-sodium or reduced-sodium). Regular tomato and vegetable juice (not low-sodium or reduced-sodium). Pickles. Olives. Fruits Canned fruit in a light or heavy syrup. Fried fruit. Fruit in cream or butter sauce. Meat and other protein foods Fatty cuts of meat. Ribs. Fried meat. Bacon. Sausage. Bologna and other processed lunch meats. Salami. Fatback. Hotdogs. Bratwurst. Salted nuts and seeds. Canned beans with added salt. Canned or smoked fish. Whole eggs or egg yolks. Chicken or turkey with skin. Dairy Whole or 2% milk, cream, and half-and-half. Whole or full-fat cream cheese. Whole-fat or sweetened yogurt. Full-fat cheese. Nondairy creamers. Whipped toppings.  Processed cheese and cheese spreads. Fats and oils Butter. Stick margarine. Lard. Shortening. Ghee. Bacon fat. Tropical oils, such as coconut, palm kernel, or palm oil. Seasoning and other foods Salted popcorn and pretzels. Onion salt, garlic salt, seasoned salt, table salt, and sea salt. Worcestershire sauce. Tartar sauce. Barbecue sauce. Teriyaki sauce. Soy sauce, including reduced-sodium. Steak sauce. Canned and packaged gravies. Fish sauce. Oyster sauce. Cocktail sauce. Horseradish that you find on the shelf. Ketchup. Mustard. Meat flavorings and tenderizers. Bouillon cubes. Hot sauce and Tabasco sauce. Premade or packaged marinades. Premade or packaged taco seasonings. Relishes. Regular salad dressings. Where to find more information:  National Heart, Lung, and Blood Institute: www.nhlbi.nih.gov  American Heart Association: www.heart.org Summary  The DASH eating plan is a healthy eating plan that has been shown to reduce high blood pressure (hypertension). It may also reduce your risk for type 2 diabetes, heart disease, and stroke.  With the DASH eating plan, you should limit salt (sodium) intake to 2,300 mg a day. If you have hypertension, you may need to reduce your sodium intake to 1,500 mg a day.  When on the DASH eating plan, aim to eat more fresh fruits and vegetables, whole grains, lean proteins, low-fat dairy, and heart-healthy fats.  Work with your health care provider or diet and nutrition specialist (dietitian) to adjust your eating plan to your individual   calorie needs. This information is not intended to replace advice given to you by your health care provider. Make sure you discuss any questions you have with your health care provider. Document Released: 07/08/2011 Document Revised: 07/12/2016 Document Reviewed: 07/12/2016 Elsevier Interactive Patient Education  2017 Elsevier Inc.  

## 2017-06-17 ENCOUNTER — Telehealth: Payer: Self-pay | Admitting: Family Medicine

## 2017-06-17 DIAGNOSIS — Z125 Encounter for screening for malignant neoplasm of prostate: Secondary | ICD-10-CM

## 2017-06-17 DIAGNOSIS — R7301 Impaired fasting glucose: Secondary | ICD-10-CM

## 2017-06-17 DIAGNOSIS — E785 Hyperlipidemia, unspecified: Secondary | ICD-10-CM

## 2017-06-17 NOTE — Telephone Encounter (Signed)
Review results to labs in results folder from Dr. Freddie Apley office.

## 2017-06-22 NOTE — Telephone Encounter (Signed)
Patient is aware and orders sent.

## 2017-06-22 NOTE — Telephone Encounter (Signed)
Please let the patient know that I did review over the lab work that neurology did -the patient still needs to do lipid profile, glucose, A1c, PSA-hyperlipidemia, fasting hyperglycemia, screening-please have the patient do that lab work thank you

## 2017-06-25 LAB — LIPID PANEL
CHOLESTEROL TOTAL: 166 mg/dL (ref 100–199)
Chol/HDL Ratio: 3.9 ratio (ref 0.0–5.0)
HDL: 43 mg/dL (ref >39)
LDL Calculated: 96 mg/dL (ref 0–99)
Triglycerides: 136 mg/dL (ref 0–149)
VLDL Cholesterol Cal: 27 mg/dL (ref 5–40)

## 2017-06-25 LAB — HEMOGLOBIN A1C
ESTIMATED AVERAGE GLUCOSE: 128 mg/dL
HEMOGLOBIN A1C: 6.1 % — AB (ref 4.8–5.6)

## 2017-06-25 LAB — PSA: PROSTATE SPECIFIC AG, SERUM: 1.4 ng/mL (ref 0.0–4.0)

## 2017-06-25 LAB — GLUCOSE, RANDOM: GLUCOSE: 121 mg/dL — AB (ref 65–99)

## 2017-08-01 ENCOUNTER — Telehealth: Payer: Self-pay | Admitting: Family Medicine

## 2017-08-01 NOTE — Telephone Encounter (Signed)
Received Rx prior auth from [facility] for pt's Zetia  Medicaid now covers the generic & not the name brand  Called [facility] & they ran claim with generic & claim went through  Nothing further for [place] to do

## 2017-08-08 ENCOUNTER — Ambulatory Visit: Payer: Medicaid Other | Admitting: Family Medicine

## 2017-08-08 ENCOUNTER — Encounter: Payer: Self-pay | Admitting: Family Medicine

## 2017-08-08 VITALS — BP 128/78 | Temp 99.2°F | Ht 69.0 in | Wt 235.0 lb

## 2017-08-08 DIAGNOSIS — R413 Other amnesia: Secondary | ICD-10-CM | POA: Diagnosis not present

## 2017-08-08 DIAGNOSIS — L719 Rosacea, unspecified: Secondary | ICD-10-CM

## 2017-08-08 DIAGNOSIS — R5383 Other fatigue: Secondary | ICD-10-CM | POA: Diagnosis not present

## 2017-08-08 MED ORDER — DOXYCYCLINE HYCLATE 100 MG PO TABS: 100.0000 mg | ORAL_TABLET | Freq: Two times a day (BID) | ORAL | 5 refills | Status: DC

## 2017-08-08 MED ORDER — TRIAMCINOLONE ACETONIDE 0.1 % EX CREA: TOPICAL_CREAM | CUTANEOUS | 4 refills | Status: DC

## 2017-08-08 NOTE — Progress Notes (Signed)
   Subjective:    Patient ID: Jim [name], male    DOB: 08/20/1955, 62 y.o.   MRN: [id number]  HPI Patient is here today with complaints of a rash on legs and face noticed it a few months ago. States the rash on legs is getting better,but the one on face is constantly there. States having some short term memory loss.I told them we may have to bring back to do a further work up. Significant rosacea issues on his nose but also significant peeling redness on his face  Review of Systems    Denies any other particular underlying issues but is having some memory issues progressive over the past several years according to his wife Objective:   Physical Exam Erythematous areas on the face along with acne rosacea neck no masses lungs clear heart regular a couple dry skin areas on his legs legs-use lotions      Assessment & Plan:  Facial area probable acne rosacea with seborrheic dermatitis steroid cream as directed plus also doxycycline which he can tolerate it just causes nausea if he does not he will follow-up in several weeks for recheck  We will also fill out memory questionnaire with the help of his wife and forward that to [place] and follow-up accordingly

## 2017-08-09 ENCOUNTER — Other Ambulatory Visit: Payer: Self-pay | Admitting: *Deleted

## 2017-08-09 LAB — TSH: TSH: 0.743 u[IU]/mL (ref 0.450–4.500)

## 2017-08-09 LAB — VITAMIN D 25 HYDROXY (VIT D DEFICIENCY, FRACTURES): VIT D 25 HYDROXY: 11.7 ng/mL — AB (ref 30.0–100.0)

## 2017-08-09 LAB — VITAMIN B12: Vitamin B-12: 539 pg/mL (ref 232–1245)

## 2017-08-09 MED ORDER — VITAMIN D (ERGOCALCIFEROL) 1.25 MG (50000 UNIT) PO CAPS: 50000.0000 [IU] | ORAL_CAPSULE | ORAL | 1 refills | Status: DC

## 2017-08-19 ENCOUNTER — Ambulatory Visit: Payer: Medicaid Other | Admitting: Family Medicine

## 2017-08-19 ENCOUNTER — Encounter: Payer: Self-pay | Admitting: Family Medicine

## 2017-08-19 VITALS — BP 126/88 | Ht 69.0 in | Wt 234.0 lb

## 2017-08-19 DIAGNOSIS — Z79899 Other long term (current) drug therapy: Secondary | ICD-10-CM

## 2017-08-19 DIAGNOSIS — E559 Vitamin D deficiency, unspecified: Secondary | ICD-10-CM | POA: Diagnosis not present

## 2017-08-19 DIAGNOSIS — E119 Type 2 diabetes mellitus without complications: Secondary | ICD-10-CM

## 2017-08-19 DIAGNOSIS — R413 Other amnesia: Secondary | ICD-10-CM | POA: Diagnosis not present

## 2017-08-19 DIAGNOSIS — I10 Essential (primary) hypertension: Secondary | ICD-10-CM

## 2017-08-19 DIAGNOSIS — E785 Hyperlipidemia, unspecified: Secondary | ICD-10-CM | POA: Diagnosis not present

## 2017-08-19 MED ORDER — DULOXETINE HCL 30 MG PO CPEP: ORAL_CAPSULE | ORAL | 4 refills | Status: DC

## 2017-08-19 NOTE — Progress Notes (Signed)
   Subjective:    Patient ID: Jim [name], male    DOB: [birth year], 62 y.o.   MRN: [id number]  HPIFollow up memory dysfunction.   Sharp constant pain on left wrist. Started one - two weeks ago.  Montreal cognitive assessment was completed Patient having memory dysfunction which is been present for at least 8 months Wife states that the patient also suffers with lack of focus and sometimes this makes his driving a little scary for her he is not had any speeding tickets no racks for he states he knows where he goes he is able to describe the route he would take in denies being a dangerous driver He will be seeing a pain medicine doctor coming up for possibility of pain medicine He is requesting some refills on his Cymbalta for depression states he is doing well with this   Review of Systems  Constitutional: Negative for activity change, appetite change and fatigue.  HENT: Negative for congestion.   Respiratory: Negative for cough.   Cardiovascular: Negative for chest pain.  Gastrointestinal: Negative for abdominal pain.  Endocrine: Negative for polydipsia and polyphagia.  Neurological: Negative for weakness.  Psychiatric/Behavioral: Negative for confusion.       Objective:   Physical Exam  Constitutional: He appears well-nourished. No distress.  Cardiovascular: Normal rate, regular rhythm and normal heart sounds.  No murmur heard. Pulmonary/Chest: Effort normal and breath sounds normal. No respiratory distress.  Musculoskeletal: He exhibits no edema.  Lymphadenopathy:    He has no cervical adenopathy.  Neurological: He is alert.  Psychiatric: His behavior is normal.  Vitals reviewed.   Diabetic foot exam normal      Assessment & Plan:  25 minutes was spent with the patient. Greater than half the time was spent in discussion and answering questions and counseling regarding the issues that the patient came in for today.  Montreal cognitive assessment 27/30 His greatest  deficit is short-term memory issues but not bad enough to call this dementia I would encourage patient keep blood pressure sugar cholesterol under good control he will check lab work before his next visit in May I have also cautioned the patient that if he does start with pain medicine that needs to be low-dose to avoid having cognitive effects We also discussed how this could progress to dementia I believe it is safe for him to drive but I recommend not driving on Highway's also recommend familiar roads only plus also daytime driving

## 2017-09-08 ENCOUNTER — Telehealth: Payer: Self-pay | Admitting: Family Medicine

## 2017-09-08 NOTE — Telephone Encounter (Signed)
Patient said that at his last visit with Dr. Nicki Reaper, he mentioned he had wrist pain.  He wants to know if Dr. Nicki Reaper could send him Rx for a wrist brace for his left wrist.   [facility] [place]

## 2017-09-08 NOTE — Telephone Encounter (Signed)
Last seen 1/18 for wrist pain. Left message to get more info

## 2017-09-08 NOTE — Telephone Encounter (Signed)
Seen 1/18 for wrist pain. Pt states pain is about the same. Takes oxycodone bid that he gets from pain clinic. It eases pain only for a little while. Would like rx for a left wrist brace

## 2017-09-08 NOTE — Telephone Encounter (Signed)
He may have a prescription for a left wrist brace to use as needed please let the patient know that we will be happy to set him up with orthopedics if he is interested

## 2017-09-09 NOTE — Telephone Encounter (Signed)
Prescription written.Awaiting signature.

## 2017-09-09 NOTE — Telephone Encounter (Signed)
Pt aware rx was sent to [facility].

## 2017-09-14 ENCOUNTER — Ambulatory Visit (HOSPITAL_COMMUNITY)
Admission: RE | Admit: 2017-09-14 | Discharge: 2017-09-14 | Disposition: A | Discharge status: Home / Self Care | Payer: Medicaid Other | Source: Ambulatory Visit | Attending: Family Medicine | Admitting: Family Medicine

## 2017-09-14 ENCOUNTER — Ambulatory Visit: Payer: Medicaid Other | Admitting: Family Medicine

## 2017-09-14 ENCOUNTER — Encounter: Payer: Self-pay | Admitting: Family Medicine

## 2017-09-14 VITALS — BP 132/84 | Ht 69.0 in | Wt 238.0 lb

## 2017-09-14 DIAGNOSIS — M25552 Pain in left hip: Secondary | ICD-10-CM | POA: Insufficient documentation

## 2017-09-14 DIAGNOSIS — M1990 Unspecified osteoarthritis, unspecified site: Secondary | ICD-10-CM | POA: Diagnosis not present

## 2017-09-14 DIAGNOSIS — M25551 Pain in right hip: Secondary | ICD-10-CM | POA: Diagnosis not present

## 2017-09-14 DIAGNOSIS — M25532 Pain in left wrist: Secondary | ICD-10-CM

## 2017-09-14 MED ORDER — PREDNISONE 20 MG PO TABS: ORAL_TABLET | ORAL | 0 refills | Status: DC

## 2017-09-14 NOTE — Progress Notes (Signed)
   Subjective:    Patient ID: Jim [name], male    DOB: May 06, 1956, 62 y.o.   MRN: [id number]  Wrist Pain   Pain location: left wrist pain. This is a new problem. Episode onset: one month. He has tried rest (brace) for the symptoms.   Wrist got to hurting and aching  wa cranking the chain saw, felt popping in the wrist  Wearing a brace since last wk  Taking two oxycodone, pain management, uses voltaren cream    No sig work straining hand    Review of Systems No headache, no major weight loss or weight gain, no chest pain no back pain abdominal pain no change in bowel habits complete ROS otherwise negative     Objective:   Physical Exam  Alert vitals stable, NAD. Blood pressure good on repeat. HEENT normal. Lungs clear. Heart regular rate and rhythm. Distinct tenderness anatomic snuffbox left wrist.  Positive Finkelstein's test      Assessment & Plan:  Impression de Quervain's tenosynovitis prednisone taper.  Short wrist splint hopefully will resolve with this if persists may need to see orthopedics and given injection x-ray done no acute findings

## 2017-10-19 ENCOUNTER — Telehealth: Payer: Self-pay | Admitting: Family Medicine

## 2017-10-20 NOTE — Telephone Encounter (Signed)
error 

## 2017-11-17 ENCOUNTER — Telehealth: Payer: Self-pay | Admitting: Family Medicine

## 2017-11-17 NOTE — Telephone Encounter (Signed)
This is not something that we can jump into.  I recommend that he follow-up with pain medicine for further discussion

## 2017-11-17 NOTE — Telephone Encounter (Signed)
Dr. Primus Bravo at [facility] did a drug screen on him and came up with drugs he said he doesn't take and wants [place] to do a drug screen here.

## 2017-11-17 NOTE — Telephone Encounter (Signed)
Patient states he failed his drug test that was taken thru his pain management but it said meds he has never taken showed up and wonders if our office can repeat the drug test and send it to pain management to prove it was wrong and he didn't fail it

## 2017-11-17 NOTE — Telephone Encounter (Signed)
Discussed with pt. Pt verbalized understanding.  °

## 2017-11-17 NOTE — Telephone Encounter (Signed)
Pt called to check on this message  States he needs a urine drug screen done today to prove that whatever showed up at the specialist is wrong  Pt very adamant that whatever they claim showed in his recent urine drug screen is not in his system (he feels test is wrong)  Please advise

## 2017-12-07 ENCOUNTER — Ambulatory Visit: Payer: Medicaid Other | Admitting: Family Medicine

## 2017-12-12 ENCOUNTER — Encounter: Payer: Self-pay | Admitting: Family Medicine

## 2017-12-12 ENCOUNTER — Ambulatory Visit: Payer: Medicaid Other | Admitting: Family Medicine

## 2017-12-12 VITALS — BP 138/80 | Ht 69.0 in | Wt 235.2 lb

## 2017-12-12 DIAGNOSIS — R7303 Prediabetes: Secondary | ICD-10-CM | POA: Diagnosis not present

## 2017-12-12 DIAGNOSIS — M1990 Unspecified osteoarthritis, unspecified site: Secondary | ICD-10-CM

## 2017-12-12 DIAGNOSIS — I10 Essential (primary) hypertension: Secondary | ICD-10-CM | POA: Diagnosis not present

## 2017-12-12 DIAGNOSIS — E7849 Other hyperlipidemia: Secondary | ICD-10-CM | POA: Diagnosis not present

## 2017-12-12 DIAGNOSIS — G4700 Insomnia, unspecified: Secondary | ICD-10-CM

## 2017-12-12 DIAGNOSIS — E559 Vitamin D deficiency, unspecified: Secondary | ICD-10-CM

## 2017-12-12 MED ORDER — METFORMIN HCL 500 MG PO TABS: 250.0000 mg | ORAL_TABLET | Freq: Two times a day (BID) | ORAL | 6 refills | Status: DC

## 2017-12-12 MED ORDER — LISINOPRIL 5 MG PO TABS: 5.0000 mg | ORAL_TABLET | Freq: Every day | ORAL | 5 refills | Status: DC

## 2017-12-12 MED ORDER — TRAZODONE HCL 50 MG PO TABS: ORAL_TABLET | ORAL | 6 refills | Status: DC

## 2017-12-12 MED ORDER — DULOXETINE HCL 60 MG PO CPEP: ORAL_CAPSULE | ORAL | 6 refills | Status: DC

## 2017-12-12 MED ORDER — EZETIMIBE 10 MG PO TABS: 10.0000 mg | ORAL_TABLET | Freq: Every day | ORAL | 1 refills | Status: DC

## 2017-12-12 NOTE — Patient Instructions (Signed)
DASH Eating Plan DASH stands for "Dietary Approaches to Stop Hypertension." The DASH eating plan is a healthy eating plan that has been shown to reduce high blood pressure (hypertension). It may also reduce your risk for type 2 diabetes, heart disease, and stroke. The DASH eating plan may also help with weight loss. What are tips for following this plan? General guidelines  Avoid eating more than 2,300 mg (milligrams) of salt (sodium) a day. If you have hypertension, you may need to reduce your sodium intake to 1,500 mg a day.  Limit alcohol intake to no more than 1 drink a day for nonpregnant women and 2 drinks a day for men. One drink equals 12 oz of beer, 5 oz of wine, or 1 oz of hard liquor.  Work with your health care provider to maintain a healthy body weight or to lose weight. Ask what an ideal weight is for you.  Get at least 30 minutes of exercise that causes your heart to beat faster (aerobic exercise) most days of the week. Activities may include walking, swimming, or biking.  Work with your health care provider or diet and nutrition specialist (dietitian) to adjust your eating plan to your individual calorie needs. Reading food labels  Check food labels for the amount of sodium per serving. Choose foods with less than 5 percent of the Daily Value of sodium. Generally, foods with less than 300 mg of sodium per serving fit into this eating plan.  To find whole grains, look for the word "whole" as the first word in the ingredient list. Shopping  Buy products labeled as "low-sodium" or "no salt added."  Buy fresh foods. Avoid canned foods and premade or frozen meals. Cooking  Avoid adding salt when cooking. Use salt-free seasonings or herbs instead of table salt or sea salt. Check with your health care provider or pharmacist before using salt substitutes.  Do not fry foods. Cook foods using healthy methods such as baking, boiling, grilling, and broiling instead.  Cook with  heart-healthy oils, such as olive, canola, soybean, or sunflower oil. Meal planning   Eat a balanced diet that includes: ? 5 or more servings of fruits and vegetables each day. At each meal, try to fill half of your plate with fruits and vegetables. ? Up to 6-8 servings of whole grains each day. ? Less than 6 oz of lean meat, poultry, or fish each day. A 3-oz serving of meat is about the same size as a deck of cards. One egg equals 1 oz. ? 2 servings of low-fat dairy each day. ? A serving of nuts, seeds, or beans 5 times each week. ? Heart-healthy fats. Healthy fats called Omega-3 fatty acids are found in foods such as flaxseeds and coldwater fish, like sardines, salmon, and mackerel.  Limit how much you eat of the following: ? Canned or prepackaged foods. ? Food that is high in trans fat, such as fried foods. ? Food that is high in saturated fat, such as fatty meat. ? Sweets, desserts, sugary drinks, and other foods with added sugar. ? Full-fat dairy products.  Do not salt foods before eating.  Try to eat at least 2 vegetarian meals each week.  Eat more home-cooked food and less restaurant, buffet, and fast food.  When eating at a restaurant, ask that your food be prepared with less salt or no salt, if possible. What foods are recommended? The items listed may not be a complete list. Talk with your dietitian about what   dietary choices are best for you. Grains Whole-grain or whole-wheat bread. Whole-grain or whole-wheat pasta. Brown rice. Oatmeal. Quinoa. Bulgur. Whole-grain and low-sodium cereals. Pita bread. Low-fat, low-sodium crackers. Whole-wheat flour tortillas. Vegetables Fresh or frozen vegetables (raw, steamed, roasted, or grilled). Low-sodium or reduced-sodium tomato and vegetable juice. Low-sodium or reduced-sodium tomato sauce and tomato paste. Low-sodium or reduced-sodium canned vegetables. Fruits All fresh, dried, or frozen fruit. Canned fruit in natural juice (without  added sugar). Meat and other protein foods Skinless chicken or turkey. Ground chicken or turkey. Pork with fat trimmed off. Fish and seafood. Egg whites. Dried beans, peas, or lentils. Unsalted nuts, nut butters, and seeds. Unsalted canned beans. Lean cuts of beef with fat trimmed off. Low-sodium, lean deli meat. Dairy Low-fat (1%) or fat-free (skim) milk. Fat-free, low-fat, or reduced-fat cheeses. Nonfat, low-sodium ricotta or cottage cheese. Low-fat or nonfat yogurt. Low-fat, low-sodium cheese. Fats and oils Soft margarine without trans fats. Vegetable oil. Low-fat, reduced-fat, or light mayonnaise and salad dressings (reduced-sodium). Canola, safflower, olive, soybean, and sunflower oils. Avocado. Seasoning and other foods Herbs. Spices. Seasoning mixes without salt. Unsalted popcorn and pretzels. Fat-free sweets. What foods are not recommended? The items listed may not be a complete list. Talk with your dietitian about what dietary choices are best for you. Grains Baked goods made with fat, such as croissants, muffins, or some breads. Dry pasta or rice meal packs. Vegetables Creamed or fried vegetables. Vegetables in a cheese sauce. Regular canned vegetables (not low-sodium or reduced-sodium). Regular canned tomato sauce and paste (not low-sodium or reduced-sodium). Regular tomato and vegetable juice (not low-sodium or reduced-sodium). Pickles. Olives. Fruits Canned fruit in a light or heavy syrup. Fried fruit. Fruit in cream or butter sauce. Meat and other protein foods Fatty cuts of meat. Ribs. Fried meat. Bacon. Sausage. Bologna and other processed lunch meats. Salami. Fatback. Hotdogs. Bratwurst. Salted nuts and seeds. Canned beans with added salt. Canned or smoked fish. Whole eggs or egg yolks. Chicken or turkey with skin. Dairy Whole or 2% milk, cream, and half-and-half. Whole or full-fat cream cheese. Whole-fat or sweetened yogurt. Full-fat cheese. Nondairy creamers. Whipped toppings.  Processed cheese and cheese spreads. Fats and oils Butter. Stick margarine. Lard. Shortening. Ghee. Bacon fat. Tropical oils, such as coconut, palm kernel, or palm oil. Seasoning and other foods Salted popcorn and pretzels. Onion salt, garlic salt, seasoned salt, table salt, and sea salt. Worcestershire sauce. Tartar sauce. Barbecue sauce. Teriyaki sauce. Soy sauce, including reduced-sodium. Steak sauce. Canned and packaged gravies. Fish sauce. Oyster sauce. Cocktail sauce. Horseradish that you find on the shelf. Ketchup. Mustard. Meat flavorings and tenderizers. Bouillon cubes. Hot sauce and Tabasco sauce. Premade or packaged marinades. Premade or packaged taco seasonings. Relishes. Regular salad dressings. Where to find more information:  National Heart, Lung, and Blood Institute: www.nhlbi.nih.gov  American Heart Association: www.heart.org Summary  The DASH eating plan is a healthy eating plan that has been shown to reduce high blood pressure (hypertension). It may also reduce your risk for type 2 diabetes, heart disease, and stroke.  With the DASH eating plan, you should limit salt (sodium) intake to 2,300 mg a day. If you have hypertension, you may need to reduce your sodium intake to 1,500 mg a day.  When on the DASH eating plan, aim to eat more fresh fruits and vegetables, whole grains, lean proteins, low-fat dairy, and heart-healthy fats.  Work with your health care provider or diet and nutrition specialist (dietitian) to adjust your eating plan to your individual   calorie needs. This information is not intended to replace advice given to you by your health care provider. Make sure you discuss any questions you have with your health care provider. Document Released: 07/08/2011 Document Revised: 07/12/2016 Document Reviewed: 07/12/2016 Elsevier Interactive Patient Education  2018 Elsevier Inc.  

## 2017-12-12 NOTE — Progress Notes (Signed)
   Subjective:    Patient ID: Jim [name], male    DOB: 11/30/1955, 62 y.o.   MRN: [id number]  Hypertension  This is a chronic problem. Pertinent negatives include no chest pain, headaches or shortness of breath. There are no compliance problems.    Pt here for 6 month follow up. Pt was seeing Dr. Merlene Laughter but no longer sees him. Needs scripts Cymbalta. Go to Pain Management doctor  Patient has chronic arthritis.  Takes anti-inflammatory for this.  Help some some  He also relates soreness in the bottom of his feet  Depression under good control takes Cymbalta currently to help him with depression as well as to help him with musculoskeletal pain and discomfort  Has a vitamin D deficiency would like to have vitamin D level checked again  Has insomnia issues would like to continue trazodone.  Prediabetes watch his diet tries to keep his weight down  Hyperlipidemia takes his medication tries to watch his diet as best as possible Review of Systems  Constitutional: Negative for activity change.  HENT: Negative for congestion and rhinorrhea.   Respiratory: Negative for cough and shortness of breath.   Cardiovascular: Negative for chest pain and leg swelling.  Gastrointestinal: Negative for abdominal pain, diarrhea, nausea and vomiting.  Genitourinary: Negative for dysuria and hematuria.  Musculoskeletal: Positive for arthralgias and back pain.  Neurological: Negative for weakness and headaches.  Hematological: Negative for adenopathy. Does not bruise/bleed easily.  Psychiatric/Behavioral: Negative for confusion and decreased concentration.       Objective:   Physical Exam  Constitutional: He appears well-nourished.  HENT:  Head: Normocephalic and atraumatic.  Eyes: Right eye exhibits no discharge. Left eye exhibits no discharge.  Neck: No tracheal deviation present.  Cardiovascular: Normal rate, regular rhythm and normal heart sounds.  No murmur heard. Pulmonary/Chest: Effort  normal and breath sounds normal. He has no rales.  Abdominal: Soft. He exhibits no distension. There is no tenderness.  Musculoskeletal: He exhibits no edema or deformity.  Lymphadenopathy:    He has no cervical adenopathy.  Neurological: He is alert. Coordination normal.  Skin: Skin is warm and dry.  Psychiatric: His behavior is normal.  Vitals reviewed.          Assessment & Plan:  HTN- Patient was seen today as part of a visit regarding hypertension. The importance of healthy diet and regular physical activity was discussed. The importance of compliance with medications discussed.  Ideal goal is to keep blood pressure low elevated levels certainly below 644/03 when possible.  The patient was counseled that keeping blood pressure under control lessen his risk of heart attack, stroke, kidney failure, and early death.  The importance of regular follow-ups was discussed with the patient.  Low-salt diet such as DASH recommended. Regular physical activity was recommended as well.  Patient was advised to keep regular follow-ups.  Arthritis issues anti-inflammatory follow through with pain management medicine as well  Prediabetes continue medication check lab work await results  Insomnia uses trazodone at nighttime  Hyperlipidemia does not tolerate statins but taking Zetia watching diet.  Vitamin D deficiency.  OTC measures check lab work  25 minutes spent patient greater than half in discussion of multiple issues

## 2017-12-14 LAB — LIPID PANEL
CHOLESTEROL TOTAL: 195 mg/dL (ref 100–199)
Chol/HDL Ratio: 5.9 ratio — ABNORMAL HIGH (ref 0.0–5.0)
HDL: 33 mg/dL — AB (ref >39)
LDL Calculated: 109 mg/dL — ABNORMAL HIGH (ref 0–99)
TRIGLYCERIDES: 263 mg/dL — AB (ref 0–149)
VLDL CHOLESTEROL CAL: 53 mg/dL — AB (ref 5–40)

## 2017-12-14 LAB — HEPATIC FUNCTION PANEL
ALBUMIN: 4.4 g/dL (ref 3.6–4.8)
ALK PHOS: 69 IU/L (ref 39–117)
ALT: 63 IU/L — AB (ref 0–44)
AST: 45 IU/L — AB (ref 0–40)
Bilirubin Total: 0.4 mg/dL (ref 0.0–1.2)
Bilirubin, Direct: 0.09 mg/dL (ref 0.00–0.40)
Total Protein: 6.5 g/dL (ref 6.0–8.5)

## 2017-12-14 LAB — BASIC METABOLIC PANEL
BUN / CREAT RATIO: 18 (ref 10–24)
BUN: 18 mg/dL (ref 8–27)
CALCIUM: 8.8 mg/dL (ref 8.6–10.2)
CHLORIDE: 105 mmol/L (ref 96–106)
CO2: 21 mmol/L (ref 20–29)
Creatinine, Ser: 1 mg/dL (ref 0.76–1.27)
GFR calc Af Amer: 92 mL/min/{1.73_m2} (ref >59)
GFR calc non Af Amer: 80 mL/min/{1.73_m2} (ref >59)
GLUCOSE: 133 mg/dL — AB (ref 65–99)
Potassium: 4.6 mmol/L (ref 3.5–5.2)
Sodium: 140 mmol/L (ref 134–144)

## 2017-12-14 LAB — HEMOGLOBIN A1C
ESTIMATED AVERAGE GLUCOSE: 151 mg/dL
Hgb A1c MFr Bld: 6.9 % — ABNORMAL HIGH (ref 4.8–5.6)

## 2017-12-14 LAB — VITAMIN D 25 HYDROXY (VIT D DEFICIENCY, FRACTURES): Vit D, 25-Hydroxy: 31.3 ng/mL (ref 30.0–100.0)

## 2017-12-15 ENCOUNTER — Telehealth: Payer: Self-pay | Admitting: Family Medicine

## 2017-12-15 NOTE — Telephone Encounter (Signed)
Pt.notified

## 2017-12-15 NOTE — Telephone Encounter (Signed)
Written. Awaiting Signature.

## 2017-12-15 NOTE — Telephone Encounter (Signed)
Patient is requesting a prescription for new meter to be called into [facility]- [address]

## 2017-12-16 ENCOUNTER — Other Ambulatory Visit: Payer: Self-pay | Admitting: *Deleted

## 2017-12-16 DIAGNOSIS — R748 Abnormal levels of other serum enzymes: Secondary | ICD-10-CM

## 2017-12-16 MED ORDER — METFORMIN HCL 500 MG PO TABS: 500.0000 mg | ORAL_TABLET | Freq: Two times a day (BID) | ORAL | 1 refills | Status: DC

## 2017-12-16 NOTE — Progress Notes (Signed)
Left message to return call 

## 2017-12-19 ENCOUNTER — Telehealth: Payer: Self-pay

## 2017-12-19 ENCOUNTER — Telehealth: Payer: Self-pay | Admitting: Family Medicine

## 2017-12-19 NOTE — Telephone Encounter (Signed)
Patient is scheduled for U/s abd and needs to be e signed please  

## 2017-12-19 NOTE — Telephone Encounter (Signed)
[facility]  Need to inform patient of ultrasound appointment  12/22/17, arrive @ [facility] @  9:15am - nothing to eat of drink 6 hours prior

## 2017-12-20 ENCOUNTER — Telehealth: Payer: Self-pay | Admitting: Family Medicine

## 2017-12-20 NOTE — Telephone Encounter (Signed)
2nd [facility] - need to notify pt of U/S appt  -  See note below      Me  12/19/17 9:50 AM   [facility] Need to inform patient of ultrasound appointment 12/22/17, arrive @ [facility] @  9:15am - nothing to eat of drink 6 hours prior

## 2017-12-22 ENCOUNTER — Ambulatory Visit (HOSPITAL_COMMUNITY)
Admission: RE | Admit: 2017-12-22 | Discharge: 2017-12-22 | Disposition: A | Discharge status: Home / Self Care | Payer: Medicaid Other | Source: Ambulatory Visit | Attending: Family Medicine | Admitting: Family Medicine

## 2017-12-22 DIAGNOSIS — R748 Abnormal levels of other serum enzymes: Secondary | ICD-10-CM | POA: Insufficient documentation

## 2017-12-22 DIAGNOSIS — R932 Abnormal findings on diagnostic imaging of liver and biliary tract: Secondary | ICD-10-CM | POA: Insufficient documentation

## 2017-12-28 ENCOUNTER — Other Ambulatory Visit: Payer: Self-pay | Admitting: Family Medicine

## 2017-12-28 DIAGNOSIS — R748 Abnormal levels of other serum enzymes: Secondary | ICD-10-CM

## 2017-12-28 DIAGNOSIS — R5383 Other fatigue: Secondary | ICD-10-CM

## 2018-01-03 LAB — ANA

## 2018-01-03 LAB — FERRITIN: Ferritin: 86 ng/mL (ref 30–400)

## 2018-01-03 LAB — HEPATITIS C ANTIBODY: Hep C Virus Ab: <0.1 s/co ratio (ref 0.0–0.9)

## 2018-01-03 LAB — CERULOPLASMIN: CERULOPLASMIN: 26.4 mg/dL (ref 16.0–31.0)

## 2018-01-03 LAB — HEPATITIS B SURFACE ANTIGEN: HEP B S AG: NEGATIVE

## 2018-01-04 ENCOUNTER — Telehealth: Payer: Self-pay | Admitting: *Deleted

## 2018-01-04 NOTE — Telephone Encounter (Signed)
Patient called requesting orthopedic shoes. [place] [facility]. Please advise [phone]

## 2018-01-05 NOTE — Telephone Encounter (Signed)
[place] typically wants [place] to do a very detailed examination and submit the dictation documentation along with the prescription in order to get diabetic shoes.  I recommend the patient schedule an office visit specific for a diabetic foot exam and documentation for shoes that way his insurance will pay for them

## 2018-01-05 NOTE — Telephone Encounter (Signed)
Patient informed and transferred to the front to schedule.

## 2018-01-11 ENCOUNTER — Encounter: Payer: Self-pay | Admitting: Family Medicine

## 2018-01-11 ENCOUNTER — Ambulatory Visit: Payer: Medicaid Other | Admitting: Family Medicine

## 2018-01-11 VITALS — BP 130/84 | Ht 69.0 in | Wt 237.2 lb

## 2018-01-11 DIAGNOSIS — Z79899 Other long term (current) drug therapy: Secondary | ICD-10-CM | POA: Diagnosis not present

## 2018-01-11 DIAGNOSIS — Z125 Encounter for screening for malignant neoplasm of prostate: Secondary | ICD-10-CM

## 2018-01-11 DIAGNOSIS — E1140 Type 2 diabetes mellitus with diabetic neuropathy, unspecified: Secondary | ICD-10-CM | POA: Insufficient documentation

## 2018-01-11 DIAGNOSIS — E785 Hyperlipidemia, unspecified: Secondary | ICD-10-CM | POA: Diagnosis not present

## 2018-01-11 DIAGNOSIS — I10 Essential (primary) hypertension: Secondary | ICD-10-CM | POA: Diagnosis not present

## 2018-01-11 NOTE — Progress Notes (Signed)
   Subjective:    Patient ID: Jim [name], male    DOB: [birth year], 62 y.o.   MRN: [id number]  HPI Pt here for diabetic foot exam. Pt states his feet hurt all the time. Pt states that he feels like he is walking on pins and needles and his feet burn all the time.  This patient has diabetes He takes his medication on a regular basis Watch his diet  He relates he has bilateral foot pain intermittent burning pins-and-needles and feels like they are on fire in addition to this he relates pain in his feet and neuropathy  Review of Systems    Please see above denies chest tightness pressure pain shortness breath wheezing difficulty breathing Objective:   Physical Exam Lungs clear respiratory rate normal heart regular no murmurs foot exam has some areas on his feet he can feel monofilament other areas he cannot he also has pre-ulcerative calluses on the inner aspects of both feet pulses present no ulcers.   Assessment-plan-diabetic neuropathy Would benefit from diabetic shoes Will send this along with prescription to his pharmacy       Assessment & Plan:  Please see above  Patient will do comprehensive lab work in a few months with follow-up office visit

## 2018-01-24 ENCOUNTER — Telehealth: Payer: Self-pay | Admitting: Family Medicine

## 2018-01-24 NOTE — Telephone Encounter (Signed)
noted 

## 2018-01-24 NOTE — Telephone Encounter (Signed)
Patient stated he spoke with [employer] and they have the prescription and notes but are going to send a special insurance form over today that must be completed by the doctor

## 2018-01-24 NOTE — Telephone Encounter (Signed)
Patient came in on 01/11/18 to discuss getting orthopaedic shoes.  He said he hasn't heard anything else about this and would like to know the status.

## 2018-01-24 NOTE — Telephone Encounter (Signed)
Nurses I did the evaluation-as best I can tell it was sent-find out from the patient where but he is requesting the shoes from.  Hand write a prescription for diabetic shoes.  I will sign the prescription again.  Have Danae Chen make a copy of that dictation along with sending the prescription to his requested supplier-and then instructed the patient to call that supplier later this week

## 2018-02-24 ENCOUNTER — Telehealth: Payer: Self-pay | Admitting: Family Medicine

## 2018-02-24 NOTE — Telephone Encounter (Signed)
Sleep apnea

## 2018-02-26 ENCOUNTER — Telehealth: Payer: Self-pay | Admitting: Family Medicine

## 2018-02-26 DIAGNOSIS — R0681 Apnea, not elsewhere classified: Secondary | ICD-10-CM

## 2018-02-26 DIAGNOSIS — G4730 Sleep apnea, unspecified: Secondary | ICD-10-CM

## 2018-02-26 NOTE — Telephone Encounter (Signed)
I discussed with the patient and with his wife when he was here with her on a recent visit for her.  Apparently he is having severe sleep apnea issues.  This patient is on opioids.  He states his current CPAP machine does not suit him well and he is not using it.  This patient is at high risk of having sleep apnea issues  I recommend a consultation with a sleep apnea specialist in [place] please go ahead with the referral-the family is aware that we are making this referral

## 2018-02-27 NOTE — Addendum Note (Signed)
Addended by: Carmelina Noun on: 02/27/2018 08:52 AM   Modules accepted: Orders

## 2018-02-27 NOTE — Telephone Encounter (Signed)
Referral put in.

## 2018-03-02 ENCOUNTER — Encounter (INDEPENDENT_AMBULATORY_CARE_PROVIDER_SITE_OTHER): Payer: Self-pay

## 2018-03-02 ENCOUNTER — Encounter: Payer: Self-pay | Admitting: Family Medicine

## 2018-04-24 ENCOUNTER — Telehealth: Payer: Self-pay | Admitting: Family Medicine

## 2018-04-24 NOTE — Telephone Encounter (Signed)
Nurse made aware: Pt is a diabetic and has bilateral leg swelling to the point both legs are in pain. Please advise pt of further instructions.

## 2018-04-24 NOTE — Telephone Encounter (Signed)
Spoke with the pt and he states he has had some bilateral leg swelling and pain. He says the right calve has been sore for going on three months and the left leg has some blistering on going for a couple of months. He has no sob,fever. He does not take any fluid pills. He is aware we have no available appt's for today,but we can see him tomorrow. Pt is aware and was transferred to the front for an appt scheduled tomorrow.

## 2018-04-25 ENCOUNTER — Ambulatory Visit: Payer: Medicaid Other | Admitting: Family Medicine

## 2018-04-25 VITALS — BP 132/82 | Ht 69.0 in | Wt 240.2 lb

## 2018-04-25 DIAGNOSIS — I739 Peripheral vascular disease, unspecified: Secondary | ICD-10-CM

## 2018-04-25 DIAGNOSIS — M7989 Other specified soft tissue disorders: Secondary | ICD-10-CM | POA: Diagnosis not present

## 2018-04-25 NOTE — Progress Notes (Signed)
   Subjective:    Patient ID: Jim [name], male    DOB: 10-31-1955, 62 y.o.   MRN: [id number]  HPI  Patient arrives with bilateral leg pain and swelling for 3 months. Patient gets intermittent spells of his calves being tight he also relates a lot of pain and discomfort when he walks he states the pain causes him to 8 he has to stop rest then there get better than he starts walking again then it starts hurting again he is hoping to get this worked out Denies any chest tightness shortness of breath Does have risk factors for peripheral vascular disease Review of Systems  Constitutional: Negative for activity change, chills and fever.  HENT: Negative for congestion, ear pain and rhinorrhea.   Eyes: Negative for discharge.  Respiratory: Negative for cough and wheezing.   Cardiovascular: Negative for chest pain.  Gastrointestinal: Negative for nausea and vomiting.  Musculoskeletal: Negative for arthralgias.   He relates leg pain but denies any severe swelling but this is been going on for months progressively worse    Objective:   Physical Exam  Lungs are clear respiratory rate normal heart is regular pulses normal foot pulses are diminished capillary refill good extremities minimal edema no tenderness in the calves      Assessment & Plan:  I doubt DVT More likely claudication We will do d-dimer May need ultrasound Recommend ABI Avoid smoking Await findings

## 2018-04-26 LAB — D-DIMER, QUANTITATIVE: D-DIMER: 0.62 mg/L FEU — ABNORMAL HIGH (ref 0.00–0.49)

## 2018-04-27 ENCOUNTER — Other Ambulatory Visit: Payer: Self-pay | Admitting: Family Medicine

## 2018-04-27 DIAGNOSIS — M7989 Other specified soft tissue disorders: Secondary | ICD-10-CM

## 2018-05-01 ENCOUNTER — Ambulatory Visit (HOSPITAL_COMMUNITY)
Admission: RE | Admit: 2018-05-01 | Discharge: 2018-05-01 | Disposition: A | Discharge status: Home / Self Care | Payer: Medicaid Other | Source: Ambulatory Visit | Attending: Family Medicine | Admitting: Family Medicine

## 2018-05-01 ENCOUNTER — Telehealth: Payer: Self-pay | Admitting: Family Medicine

## 2018-05-01 ENCOUNTER — Other Ambulatory Visit: Payer: Self-pay | Admitting: *Deleted

## 2018-05-01 DIAGNOSIS — I739 Peripheral vascular disease, unspecified: Secondary | ICD-10-CM | POA: Diagnosis not present

## 2018-05-01 DIAGNOSIS — M7989 Other specified soft tissue disorders: Secondary | ICD-10-CM | POA: Insufficient documentation

## 2018-05-01 NOTE — Telephone Encounter (Signed)
[place] ultrasound incorrect CPT code, needs correct one.

## 2018-05-02 NOTE — Telephone Encounter (Signed)
done

## 2018-05-03 ENCOUNTER — Ambulatory Visit (HOSPITAL_COMMUNITY)
Admission: RE | Admit: 2018-05-03 | Discharge: 2018-05-03 | Disposition: A | Discharge status: Home / Self Care | Payer: Medicaid Other | Source: Ambulatory Visit | Attending: Family Medicine | Admitting: Family Medicine

## 2018-05-03 DIAGNOSIS — R7989 Other specified abnormal findings of blood chemistry: Secondary | ICD-10-CM | POA: Diagnosis not present

## 2018-05-03 DIAGNOSIS — M7989 Other specified soft tissue disorders: Secondary | ICD-10-CM | POA: Insufficient documentation

## 2018-05-08 ENCOUNTER — Encounter: Payer: Medicaid Other | Admitting: Vascular Surgery

## 2018-05-15 ENCOUNTER — Encounter: Payer: Self-pay | Admitting: Vascular Surgery

## 2018-05-15 ENCOUNTER — Ambulatory Visit (INDEPENDENT_AMBULATORY_CARE_PROVIDER_SITE_OTHER): Payer: Medicaid Other | Admitting: Vascular Surgery

## 2018-05-15 VITALS — BP 127/76 | HR 62 | Temp 97.8°F | Resp 20 | Ht 69.0 in | Wt 236.4 lb

## 2018-05-15 DIAGNOSIS — I70211 Atherosclerosis of native arteries of extremities with intermittent claudication, right leg: Secondary | ICD-10-CM | POA: Diagnosis not present

## 2018-05-15 NOTE — Progress Notes (Signed)
V[facility]in Specialist of [place]  Patient name: Jim [name] MRN: [id number] DOB: May 06, 1956 Sex: male  REASON FOR CONSULT: Evaluation of right lower extremity claudication  Seen today in our [place] office  HPI: Jim [name] is a 62 y.o. male, who is here today for evaluation of right lower extremity claudication.  He reports that this has been progressive over the past 4 to 6 months.  He reports very classic calf cramping and aching with walking a short distance.  This is relieved with resting for a minute or 2 and then if he walks again this recurs quickly.  He does report occasional buttock and thigh discomfort but this is mainly calf.  He does not have any lower extremity tissue loss.  He does have a history of bilateral knee replacements several years ago and did well with this.  He was a long-term smoker but quit 6 years ago.  No cardiac disease and he is semiretired and this does make it difficult for him to do his job   Past Medical History:  Diagnosis Date  . Arthritis   . Back pain   . Bursitis of hip    Bilateral  . Depression   . 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 Ex-smoker    Quit 11/07/2011  . Gastric ulcer   . History of colon polyps   . Hyperlipidemia   . Primary localized osteoarthritis of right knee     Family History  Problem Relation Age of Onset  . Heart disease Mother   . Heart disease Father   . Diabetes type I Father   . Heart disease Sister   . Heart disease Brother   . Hypertension Father   . Cancer Father     SOCIAL HISTORY: Social History   Socioeconomic History  . Marital status: Legally Separated    Spouse name: Not on file  . Number of children: Not on file  . Years of education: Not on file  . Highest education level: Not on file  Occupational History  . Occupation: disabled    Employer: [employer]  . Financial resource strain: Not on  file  . Food insecurity:    Worry: Not on file    Inability: Not on file  . Transportation needs:    Medical: Not on file    Non-medical: Not on file  Tobacco Use  . Smoking status: Former Smoker    Packs/day: 2.00    Years: 40.00    Pack years: 80.00    Types: Cigarettes    Last attempt to quit: 10/31/2011    Years since quitting: 6.5  . Smokeless tobacco: Never Used  Substance and Sexual Activity  . Alcohol use: Yes    Comment: 1 beer every 3-4 months  . Drug use: No  . Sexual activity: Yes  Lifestyle  . Physical activity:    Days per week: Not on file    Minutes per session: Not on file  . Stress: Not on file  Relationships  . Social connections:    Talks on phone: Not on file    Gets together: Not on file    Attends religious service: Not on file    Active member of club or organization: Not on file    Attends meetings of clubs or organizations: Not on file    Relationship status: Not on file  . Intimate partner violence:  Fear of current or ex partner: Not on file    Emotionally abused: Not on file    Physically abused: Not on file    Forced sexual activity: Not on file  Other Topics Concern  . Not on file  Social History Narrative  . Not on file    Allergies  Allergen Reactions  . Doxycycline Nausea Only  . Pravastatin     myalgia    Current Outpatient Medications  Medication Sig Dispense Refill  . albuterol (PROVENTIL HFA;VENTOLIN HFA) 108 (90 Base) MCG/ACT inhaler Inhale 2 puffs every 6 (six) hours as needed into the lungs for wheezing. 1 Inhaler 6  . DULoxetine (CYMBALTA) 60 MG capsule take 1 capsule by mouth once daily 30 capsule 6  . ezetimibe (ZETIA) 10 MG tablet Take 1 tablet (10 mg total) by mouth daily. 90 tablet 1  . lisinopril (PRINIVIL,ZESTRIL) 5 MG tablet Take 1 tablet (5 mg total) by mouth daily. 30 tablet 5  . metFORMIN (GLUCOPHAGE) 500 MG tablet Take 1 tablet (500 mg total) by mouth 2 (two) times daily with a meal. 180 tablet 1  .  Oxycodone HCl 10 MG TABS take 1/2-1 tablet by mouth one to two times a day IF TOLERATED  0  . traZODone (DESYREL) 50 MG tablet 1 each evening 30 tablet 6  . triamcinolone cream (KENALOG) 0.1 % Apply bid prn 45 g 4  . Vitamin D, Ergocalciferol, (DRISDOL) 50000 units CAPS capsule Take 1 capsule (50,000 Units total) by mouth every 7 (seven) days. Take one capsule daily for 8 weeks then stop and take over the counter vitamin D 1,000 units daily. 4 capsule 1   No current facility-administered medications for this visit.     REVIEW OF SYSTEMS:  [X]  denotes positive finding, [ ]  denotes negative finding Cardiac  Comments:  Chest pain or chest pressure:    Shortness of breath upon exertion:    Short of breath when lying flat:    Irregular heart rhythm:        Vascular    Pain in calf, thigh, or hip brought on by ambulation: x   Pain in feet at night that wakes you up from your sleep:     Blood clot in your veins:    Leg swelling:         Pulmonary    Oxygen at home:    Productive cough:     Wheezing:         Neurologic    Sudden weakness in arms or legs:     Sudden numbness in arms or legs:     Sudden onset of difficulty speaking or slurred speech:    Temporary loss of vision in one eye:     Problems with dizziness:         Gastrointestinal    Blood in stool:     Vomited blood:         Genitourinary    Burning when urinating:     Blood in urine:        Psychiatric    Major depression:         Hematologic    Bleeding problems:    Problems with blood clotting too easily:        Skin    Rashes or ulcers:        Constitutional    Fever or chills:      PHYSICAL EXAM: Vitals:   05/15/18 1032 05/15/18 1033  BP: 137/68 127/76  Pulse: 62 62  Resp: 20   Temp: 97.8 F (36.6 C)   TempSrc: Temporal   Weight: 236 lb 6.4 oz (107.2 kg)   Height: 5\' 9"  (1.753 m)     GENERAL: The patient is a well-nourished male, in no acute distress. The vital signs are documented  above. CARDIOVASCULAR: Carotid arteries without bruits bilaterally 2+ radial pulses bilaterally.  Diminished femoral pulses by palpation bilaterally possibly due to obesity.  I can palpate a left popliteal pulse and no popliteal pulse on the right.  No pedal pulses on the right PULMONARY: There is good air exchange  ABDOMEN: Soft and non-tender  MUSCULOSKELETAL: There are no major deformities or cyanosis. NEUROLOGIC: No focal weakness or paresthesias are detected. SKIN: There are no ulcers or rashes noted. PSYCHIATRIC: The patient has a normal affect.  DATA:  Noninvasive studies at [facility] earlier this month reveal ankle arm index of 0.6 on the right with blunted waveforms.  Normal ankle arm index on the left with triphasic waveforms  MEDICAL ISSUES: I had long discussion with the patient and his wife present.  He clearly has classic right leg claudication.  I explained that this is not limb threatening.  He reports that he is unable to tolerate this level of claudication in his work activities and also daytime activities.  I explained the next option would be arteriography for further evaluation.  His symptoms are mainly calf but he does have some thigh symptoms which could be arterial insufficiency as well.  Explained that would make decision at the time of the procedure regarding indications for angioplasty versus continued observation or consideration of open surgery for treatment.  He understands and wished to proceed as soon as possible as an outpatient at [facility]   Rosetta Posner, MD [facility] Vascular and Vein Specialists of [facility] Tel [phone] Pager [phone]

## 2018-05-15 NOTE — H&P (View-Only) (Signed)
Vascular and Vein Specialist of [place]  Patient name: Jim [name] MRN: [id number] DOB: 30-Aug-1955 Sex: male  REASON FOR CONSULT: Evaluation of right lower extremity claudication  Seen today in our [place] office  HPI: Jim [name] is a 62 y.o. male, who is here today for evaluation of right lower extremity claudication.  He reports that this has been progressive over the past 4 to 6 months.  He reports very classic calf cramping and aching with walking a short distance.  This is relieved with resting for a minute or 2 and then if he walks again this recurs quickly.  He does report occasional buttock and thigh discomfort but this is mainly calf.  He does not have any lower extremity tissue loss.  He does have a history of bilateral knee replacements several years ago and did well with this.  He was a long-term smoker but quit 6 years ago.  No cardiac disease and he is semiretired and this does make it difficult for him to do his job   Past Medical History:  Diagnosis Date  . Arthritis   . Back pain   . Bursitis of hip    Bilateral  . Depression   . 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 Ex-smoker    Quit 11/07/2011  . Gastric ulcer   . History of colon polyps   . Hyperlipidemia   . Primary localized osteoarthritis of right knee     Family History  Problem Relation Age of Onset  . Heart disease Mother   . Heart disease Father   . Diabetes type I Father   . Heart disease Sister   . Heart disease Brother   . Hypertension Father   . Cancer Father     SOCIAL HISTORY: Social History   Socioeconomic History  . Marital status: Legally Separated    Spouse name: Not on file  . Number of children: Not on file  . Years of education: Not on file  . Highest education level: Not on file  Occupational History  . Occupation: disabled    Employer: [employer]  . Financial resource strain: Not on  file  . Food insecurity:    Worry: Not on file    Inability: Not on file  . Transportation needs:    Medical: Not on file    Non-medical: Not on file  Tobacco Use  . Smoking status: Former Smoker    Packs/day: 2.00    Years: 40.00    Pack years: 80.00    Types: Cigarettes    Last attempt to quit: 10/31/2011    Years since quitting: 6.5  . Smokeless tobacco: Never Used  Substance and Sexual Activity  . Alcohol use: Yes    Comment: 1 beer every 3-4 months  . Drug use: No  . Sexual activity: Yes  Lifestyle  . Physical activity:    Days per week: Not on file    Minutes per session: Not on file  . Stress: Not on file  Relationships  . Social connections:    Talks on phone: Not on file    Gets together: Not on file    Attends religious service: Not on file    Active member of club or organization: Not on file    Attends meetings of clubs or organizations: Not on file    Relationship status: Not on file  . Intimate partner violence:  Fear of current or ex partner: Not on file    Emotionally abused: Not on file    Physically abused: Not on file    Forced sexual activity: Not on file  Other Topics Concern  . Not on file  Social History Narrative  . Not on file    Allergies  Allergen Reactions  . Doxycycline Nausea Only  . Pravastatin     myalgia    Current Outpatient Medications  Medication Sig Dispense Refill  . albuterol (PROVENTIL HFA;VENTOLIN HFA) 108 (90 Base) MCG/ACT inhaler Inhale 2 puffs every 6 (six) hours as needed into the lungs for wheezing. 1 Inhaler 6  . DULoxetine (CYMBALTA) 60 MG capsule take 1 capsule by mouth once daily 30 capsule 6  . ezetimibe (ZETIA) 10 MG tablet Take 1 tablet (10 mg total) by mouth daily. 90 tablet 1  . lisinopril (PRINIVIL,ZESTRIL) 5 MG tablet Take 1 tablet (5 mg total) by mouth daily. 30 tablet 5  . metFORMIN (GLUCOPHAGE) 500 MG tablet Take 1 tablet (500 mg total) by mouth 2 (two) times daily with a meal. 180 tablet 1  .  Oxycodone HCl 10 MG TABS take 1/2-1 tablet by mouth one to two times a day IF TOLERATED  0  . traZODone (DESYREL) 50 MG tablet 1 each evening 30 tablet 6  . triamcinolone cream (KENALOG) 0.1 % Apply bid prn 45 g 4  . Vitamin D, Ergocalciferol, (DRISDOL) 50000 units CAPS capsule Take 1 capsule (50,000 Units total) by mouth every 7 (seven) days. Take one capsule daily for 8 weeks then stop and take over the counter vitamin D 1,000 units daily. 4 capsule 1   No current facility-administered medications for this visit.     REVIEW OF SYSTEMS:  [X]  denotes positive finding, [ ]  denotes negative finding Cardiac  Comments:  Chest pain or chest pressure:    Shortness of breath upon exertion:    Short of breath when lying flat:    Irregular heart rhythm:        Vascular    Pain in calf, thigh, or hip brought on by ambulation: x   Pain in feet at night that wakes you up from your sleep:     Blood clot in your veins:    Leg swelling:         Pulmonary    Oxygen at home:    Productive cough:     Wheezing:         Neurologic    Sudden weakness in arms or legs:     Sudden numbness in arms or legs:     Sudden onset of difficulty speaking or slurred speech:    Temporary loss of vision in one eye:     Problems with dizziness:         Gastrointestinal    Blood in stool:     Vomited blood:         Genitourinary    Burning when urinating:     Blood in urine:        Psychiatric    Major depression:         Hematologic    Bleeding problems:    Problems with blood clotting too easily:        Skin    Rashes or ulcers:        Constitutional    Fever or chills:      PHYSICAL EXAM: Vitals:   05/15/18 1032 05/15/18 1033  BP: 137/68 127/76  Pulse: 62 62  Resp: 20   Temp: 97.8 F (36.6 C)   TempSrc: Temporal   Weight: 236 lb 6.4 oz (107.2 kg)   Height: 5\' 9"  (1.753 m)     GENERAL: The patient is a well-nourished male, in no acute distress. The vital signs are documented  above. CARDIOVASCULAR: Carotid arteries without bruits bilaterally 2+ radial pulses bilaterally.  Diminished femoral pulses by palpation bilaterally possibly due to obesity.  I can palpate a left popliteal pulse and no popliteal pulse on the right.  No pedal pulses on the right PULMONARY: There is good air exchange  ABDOMEN: Soft and non-tender  MUSCULOSKELETAL: There are no major deformities or cyanosis. NEUROLOGIC: No focal weakness or paresthesias are detected. SKIN: There are no ulcers or rashes noted. PSYCHIATRIC: The patient has a normal affect.  DATA:  Noninvasive studies at [facility] earlier this month reveal ankle arm index of 0.6 on the right with blunted waveforms.  Normal ankle arm index on the left with triphasic waveforms  MEDICAL ISSUES: I had long discussion with the patient and his wife present.  He clearly has classic right leg claudication.  I explained that this is not limb threatening.  He reports that he is unable to tolerate this level of claudication in his work activities and also daytime activities.  I explained the next option would be arteriography for further evaluation.  His symptoms are mainly calf but he does have some thigh symptoms which could be arterial insufficiency as well.  Explained that would make decision at the time of the procedure regarding indications for angioplasty versus continued observation or consideration of open surgery for treatment.  He understands and wished to proceed as soon as possible as an outpatient at [facility]   Rosetta Posner, MD [facility] Vascular and Vein Specialists of [facility] Tel [phone] Pager [phone]

## 2018-05-16 ENCOUNTER — Other Ambulatory Visit: Payer: Self-pay | Admitting: Vascular Surgery

## 2018-05-16 LAB — LIPID PANEL
Chol/HDL Ratio: 4.1 ratio (ref 0.0–5.0)
Cholesterol, Total: 173 mg/dL (ref 100–199)
HDL: 42 mg/dL (ref >39)
LDL Calculated: 100 mg/dL — ABNORMAL HIGH (ref 0–99)
Triglycerides: 154 mg/dL — ABNORMAL HIGH (ref 0–149)
VLDL Cholesterol Cal: 31 mg/dL (ref 5–40)

## 2018-05-16 LAB — HEPATIC FUNCTION PANEL
ALBUMIN: 4.3 g/dL (ref 3.6–4.8)
ALK PHOS: 54 IU/L (ref 39–117)
ALT: 51 IU/L — ABNORMAL HIGH (ref 0–44)
AST: 25 IU/L (ref 0–40)
Bilirubin Total: 0.3 mg/dL (ref 0.0–1.2)
Bilirubin, Direct: 0.1 mg/dL (ref 0.00–0.40)
TOTAL PROTEIN: 6.7 g/dL (ref 6.0–8.5)

## 2018-05-16 LAB — BASIC METABOLIC PANEL
BUN/Creatinine Ratio: 14 (ref 10–24)
BUN: 14 mg/dL (ref 8–27)
CO2: 25 mmol/L (ref 20–29)
CREATININE: 1.03 mg/dL (ref 0.76–1.27)
Calcium: 9.7 mg/dL (ref 8.6–10.2)
Chloride: 104 mmol/L (ref 96–106)
GFR, EST AFRICAN AMERICAN: 89 mL/min/{1.73_m2} (ref >59)
GFR, EST NON AFRICAN AMERICAN: 77 mL/min/{1.73_m2} (ref >59)
Glucose: 148 mg/dL — ABNORMAL HIGH (ref 65–99)
Potassium: 5.1 mmol/L (ref 3.5–5.2)
SODIUM: 143 mmol/L (ref 134–144)

## 2018-05-16 LAB — HEMOGLOBIN A1C
Est. average glucose Bld gHb Est-mCnc: 154 mg/dL
Hgb A1c MFr Bld: 7 % — ABNORMAL HIGH (ref 4.8–5.6)

## 2018-05-16 LAB — MICROALBUMIN / CREATININE URINE RATIO
Creatinine, Urine: 76.9 mg/dL
MICROALB/CREAT RATIO: 7.4 mg/g{creat} (ref 0.0–30.0)
MICROALBUM., U, RANDOM: 5.7 ug/mL

## 2018-05-16 LAB — PSA: Prostate Specific Ag, Serum: 0.8 ng/mL (ref 0.0–4.0)

## 2018-05-19 ENCOUNTER — Telehealth: Payer: Self-pay | Admitting: Vascular Surgery

## 2018-05-19 ENCOUNTER — Ambulatory Visit (HOSPITAL_COMMUNITY)
Admission: RE | Admit: 2018-05-19 | Discharge: 2018-05-19 | Disposition: A | Discharge status: Home / Self Care | Payer: Medicaid Other | Source: Ambulatory Visit | Attending: Vascular Surgery | Admitting: Vascular Surgery

## 2018-05-19 ENCOUNTER — Telehealth: Payer: Self-pay | Admitting: *Deleted

## 2018-05-19 ENCOUNTER — Ambulatory Visit (HOSPITAL_COMMUNITY)
Admission: RE | Disposition: A | Discharge status: Home / Self Care | Payer: Self-pay | Source: Ambulatory Visit | Attending: Vascular Surgery

## 2018-05-19 DIAGNOSIS — E785 Hyperlipidemia, unspecified: Secondary | ICD-10-CM | POA: Insufficient documentation

## 2018-05-19 DIAGNOSIS — F329 Major depressive disorder, single episode, unspecified: Secondary | ICD-10-CM | POA: Diagnosis not present

## 2018-05-19 DIAGNOSIS — I739 Peripheral vascular disease, unspecified: Secondary | ICD-10-CM | POA: Diagnosis present

## 2018-05-19 DIAGNOSIS — I70213 Atherosclerosis of native arteries of extremities with intermittent claudication, bilateral legs: Secondary | ICD-10-CM

## 2018-05-19 DIAGNOSIS — Z87891 Personal history of nicotine dependence: Secondary | ICD-10-CM | POA: Diagnosis not present

## 2018-05-19 DIAGNOSIS — Z7984 Long term (current) use of oral hypoglycemic drugs: Secondary | ICD-10-CM | POA: Diagnosis not present

## 2018-05-19 HISTORY — PX: PERIPHERAL VASCULAR INTERVENTION: CATH118257

## 2018-05-19 HISTORY — PX: ABDOMINAL AORTOGRAM W/LOWER EXTREMITY: CATH118223

## 2018-05-19 LAB — POCT I-STAT, CHEM 8
BUN: 16 mg/dL (ref 8–23)
CALCIUM ION: 1.17 mmol/L (ref 1.15–1.40)
Chloride: 110 mmol/L (ref 98–111)
Creatinine, Ser: 1 mg/dL (ref 0.61–1.24)
GLUCOSE: 145 mg/dL — AB (ref 70–99)
HCT: 41 % (ref 39.0–52.0)
Hemoglobin: 13.9 g/dL (ref 13.0–17.0)
Potassium: 4.5 mmol/L (ref 3.5–5.1)
SODIUM: 143 mmol/L (ref 135–145)
TCO2: 26 mmol/L (ref 22–32)

## 2018-05-19 LAB — GLUCOSE, CAPILLARY
GLUCOSE-CAPILLARY: 134 mg/dL — AB (ref 70–99)
GLUCOSE-CAPILLARY: 145 mg/dL — AB (ref 70–99)

## 2018-05-19 LAB — POCT ACTIVATED CLOTTING TIME
ACTIVATED CLOTTING TIME: 224 s
ACTIVATED CLOTTING TIME: 208 s
ACTIVATED CLOTTING TIME: 180 s
Activated Clotting Time: 235 seconds

## 2018-05-19 SURGERY — ABDOMINAL AORTOGRAM W/LOWER EXTREMITY
Anesthesia: LOCAL | Laterality: Right

## 2018-05-19 MED ORDER — SODIUM CHLORIDE 0.9% FLUSH: 3.0000 mL | INTRAVENOUS | Status: DC | PRN

## 2018-05-19 MED ORDER — ACETAMINOPHEN 325 MG PO TABS: 650.0000 mg | ORAL_TABLET | ORAL | Status: DC | PRN

## 2018-05-19 MED ORDER — HEPARIN SODIUM (PORCINE) 1000 UNIT/ML IJ SOLN
INTRAMUSCULAR | Status: DC | PRN
  Administered 2018-05-19: 5000 [IU] via INTRAVENOUS

## 2018-05-19 MED ORDER — CLOPIDOGREL BISULFATE 75 MG PO TABS: 300.0000 mg | ORAL_TABLET | Freq: Once | ORAL | Status: DC

## 2018-05-19 MED ORDER — HEPARIN (PORCINE) IN NACL 1000-0.9 UT/500ML-% IV SOLN
INTRAVENOUS | Status: DC | PRN
  Administered 2018-05-19 (×2): 500 mL

## 2018-05-19 MED ORDER — HYDROMORPHONE HCL 1 MG/ML IJ SOLN: 0.5000 mg | INTRAMUSCULAR | Status: DC | PRN

## 2018-05-19 MED ORDER — MIDAZOLAM HCL 2 MG/2ML IJ SOLN
INTRAMUSCULAR | Status: DC | PRN
  Administered 2018-05-19 (×2): 1 mg via INTRAVENOUS

## 2018-05-19 MED ORDER — HYDROMORPHONE HCL 1 MG/ML IJ SOLN
INTRAMUSCULAR | Status: AC
  Filled 2018-05-19: qty 0.5

## 2018-05-19 MED ORDER — OXYCODONE HCL 5 MG PO TABS
ORAL_TABLET | ORAL | Status: AC
  Filled 2018-05-19: qty 2

## 2018-05-19 MED ORDER — FENTANYL CITRATE (PF) 100 MCG/2ML IJ SOLN
INTRAMUSCULAR | Status: DC | PRN
  Administered 2018-05-19 (×2): 50 ug via INTRAVENOUS
  Administered 2018-05-19 (×2): 25 ug via INTRAVENOUS

## 2018-05-19 MED ORDER — CLOPIDOGREL BISULFATE 75 MG PO TABS: 75.0000 mg | ORAL_TABLET | Freq: Every day | ORAL | 11 refills | Status: DC

## 2018-05-19 MED ORDER — CLOPIDOGREL BISULFATE 300 MG PO TABS
ORAL_TABLET | ORAL | Status: AC
  Filled 2018-05-19: qty 1

## 2018-05-19 MED ORDER — HEPARIN SODIUM (PORCINE) 1000 UNIT/ML IJ SOLN
INTRAMUSCULAR | Status: AC
  Filled 2018-05-19: qty 1

## 2018-05-19 MED ORDER — LIDOCAINE HCL (PF) 1 % IJ SOLN
INTRAMUSCULAR | Status: DC | PRN
  Administered 2018-05-19: 15 mL

## 2018-05-19 MED ORDER — SODIUM CHLORIDE 0.9 % IV SOLN: INTRAVENOUS | Status: AC

## 2018-05-19 MED ORDER — OXYCODONE HCL 5 MG PO TABS
5.0000 mg | ORAL_TABLET | ORAL | Status: DC | PRN
  Administered 2018-05-19 (×2): 10 mg via ORAL
  Filled 2018-05-19: qty 2

## 2018-05-19 MED ORDER — ONDANSETRON HCL 4 MG/2ML IJ SOLN: 4.0000 mg | Freq: Four times a day (QID) | INTRAMUSCULAR | Status: DC | PRN

## 2018-05-19 MED ORDER — FENTANYL CITRATE (PF) 100 MCG/2ML IJ SOLN
INTRAMUSCULAR | Status: AC
  Filled 2018-05-19: qty 2

## 2018-05-19 MED ORDER — SODIUM CHLORIDE 0.9 % IV SOLN: 250.0000 mL | INTRAVENOUS | Status: DC | PRN

## 2018-05-19 MED ORDER — LIDOCAINE HCL (PF) 1 % IJ SOLN
INTRAMUSCULAR | Status: AC
  Filled 2018-05-19: qty 30

## 2018-05-19 MED ORDER — IODIXANOL 320 MG/ML IV SOLN
INTRAVENOUS | Status: DC | PRN
  Administered 2018-05-19: 160 mL via INTRA_ARTERIAL

## 2018-05-19 MED ORDER — CLOPIDOGREL BISULFATE 75 MG PO TABS: 75.0000 mg | ORAL_TABLET | Freq: Every day | ORAL | Status: DC

## 2018-05-19 MED ORDER — HEPARIN (PORCINE) IN NACL 1000-0.9 UT/500ML-% IV SOLN
INTRAVENOUS | Status: AC
  Filled 2018-05-19: qty 1000

## 2018-05-19 MED ORDER — HEPARIN (PORCINE) IN NACL 2000-0.9 UNIT/L-% IV SOLN
INTRAVENOUS | Status: DC | PRN
  Administered 2018-05-19: 10000 mL

## 2018-05-19 MED ORDER — MIDAZOLAM HCL 2 MG/2ML IJ SOLN
INTRAMUSCULAR | Status: AC
  Filled 2018-05-19: qty 2

## 2018-05-19 MED ORDER — ASPIRIN EC 325 MG PO TBEC: 325.0000 mg | DELAYED_RELEASE_TABLET | Freq: Every day | ORAL | Status: DC

## 2018-05-19 MED ORDER — HYDRALAZINE HCL 20 MG/ML IJ SOLN: 5.0000 mg | INTRAMUSCULAR | Status: DC | PRN

## 2018-05-19 MED ORDER — LABETALOL HCL 5 MG/ML IV SOLN: 10.0000 mg | INTRAVENOUS | Status: DC | PRN

## 2018-05-19 MED ORDER — SODIUM CHLORIDE 0.9% FLUSH: 3.0000 mL | Freq: Two times a day (BID) | INTRAVENOUS | Status: DC

## 2018-05-19 MED ORDER — SODIUM CHLORIDE 0.9 % IV SOLN
INTRAVENOUS | Status: DC
  Administered 2018-05-19: 06:00:00 via INTRAVENOUS

## 2018-05-19 MED ORDER — CLOPIDOGREL BISULFATE 300 MG PO TABS
ORAL_TABLET | ORAL | Status: DC | PRN
  Administered 2018-05-19: 300 mg via ORAL

## 2018-05-19 MED ORDER — HYDROMORPHONE HCL 1 MG/ML IJ SOLN
INTRAMUSCULAR | Status: DC | PRN
  Administered 2018-05-19: 0.5 mg via INTRAVENOUS

## 2018-05-19 MED ORDER — ASPIRIN EC 325 MG PO TBEC: 325.0000 mg | DELAYED_RELEASE_TABLET | Freq: Every day | ORAL | 3 refills | Status: AC

## 2018-05-19 SURGICAL SUPPLY — 21 items
BAG SNAP BAND KOVER 36X36 (MISCELLANEOUS) ×1 IMPLANT
BALLN MUSTANG 5X80X135 (BALLOONS) ×3
BALLOON MUSTANG 5X80X135 (BALLOONS) IMPLANT
CATH ANGIO 5F PIGTAIL 65CM (CATHETERS) ×1 IMPLANT
CATH CROSS OVER TEMPO 5F (CATHETERS) ×1 IMPLANT
CATH QUICKCROSS .035X135CM (MICROCATHETER) ×1 IMPLANT
CATH SOFT-VU ST 4F 90CM (CATHETERS) ×1 IMPLANT
COVER DOME SNAP 22 D (MISCELLANEOUS) ×1 IMPLANT
GUIDEWIRE .025 260CM (WIRE) IMPLANT
GUIDEWIRE ANGLED .035X260CM (WIRE) ×1 IMPLANT
KIT PV (KITS) ×3 IMPLANT
SHEATH PINNACLE 5F 10CM (SHEATH) ×1 IMPLANT
SHEATH PINNACLE MP 7F 45CM (SHEATH) ×1 IMPLANT
SHEATH PROBE COVER 6X72 (BAG) ×1 IMPLANT
STENT ELUVIA 6X100X130 (Permanent Stent) ×1 IMPLANT
SYR MEDRAD MARK V 150ML (SYRINGE) ×3 IMPLANT
TRANSDUCER W/STOPCOCK (MISCELLANEOUS) ×3 IMPLANT
TRAY PV CATH (CUSTOM PROCEDURE TRAY) ×3 IMPLANT
WIRE HITORQ VERSACORE ST 145CM (WIRE) ×1 IMPLANT
WIRE ROSEN-J .035X260CM (WIRE) ×1 IMPLANT
WIRE VERSACORE LOC 115CM (WIRE) ×1 IMPLANT

## 2018-05-19 NOTE — Telephone Encounter (Signed)
sch appt lvm mld ltr 08/24/18 10am ABI 11am LE Art 1215pm p/o NP

## 2018-05-19 NOTE — Op Note (Addendum)
Procedure: Abdominal aortogram with bilateral lower extremity runoff right popliteal stent  Preoperative diagnosis: Claudication  Postoperative diagnosis: Same  Anesthesia: Local with IV sedation  Operative findings: #1 left leg 50% left SFA stenosis two-vessel runoff very diseased peroneal artery patent posterior tibial artery occluded anterior tibial artery  2.  Right popliteal artery occlusion with reconstitution above the knee two-vessel runoff via the posterior tibial and peroneal arteries  3.  Recanalization of above-knee popliteal artery and stenting to residual 0% stenosis with 6 x 100 Eluvia drug-eluting stent  Operative details: After obtaining informed consent, the patient was taken the [place] lab.  The patient was placed in supine position Angio table.  Both groins were prepped and draped in usual sterile fashion.  Local anesthesia was infiltrated over the left common femoral artery.  Ultrasound was used to identify the left common femoral artery.  This was patent.  Femoral bifurcation was also done applied.  A permanent image hardcopy was made to place in the patient's permanent medical record.  An introducer needle was used to cannulate the left common femoral artery and an 035 versacore wire threaded up in the abdominal aorta under fluoroscopic guidance.  Next a 5 French sheath was placed over the guidewire in the left common femoral artery.  This was thoroughly flushed with heparinized saline.  A 5 French pig tail catheter was then advanced over the guidewire up into the abdominal aorta and abdominal aortogram was obtained in AP projection.  The left and right renal arteries are widely patent.  The infrarenal abdominal aorta left and right common external and internal iliac arteries are all widely patent.  At this point the pedal catheter was pulled down just above the aortic bifurcation and bilateral lower extremity runoff views were obtained through the pico catheter.  New  In the  left lower extremity, the left common femoral profunda femoris superficial femoral arteries are all patent.  There is a 50% stenosis in the midportion of the left superficial femoral artery.  The popliteal artery is patent.  There is two-vessel runoff to the left foot the posterior tibial artery is the dominant vessel the peroneal although patent is small and quite diseased.  The anterior tibial artery is occluded.  In the right lower extreme of the right common femoral profunda femoris and superficial femoral arteries are patent.  The popliteal artery occludes at its origin at the adductor hiatus.  It then reconstitutes via collaterals above the knee.  There is two-vessel runoff to the right leg via the posterior tibial and peroneal arteries.  Again the peroneal artery is fairly small.  A lateral view of the right knee was also obtained due to the overlying hardware from a knee replacement.  This confirmed the above findings.  At this point was decided to intervene on the popliteal artery occlusion.  The pigtail catheter was removed over guidewire.  A 5 French crossover catheter was used to selectively catheterize the right common iliac artery and then the 035 Rosen wire advanced all the way into the right superficial femoral artery.  A 5 French sheath was removed and a 7 [place] destination sheath advanced up and over the aortic bifurcation and into the proximal right superficial femoral artery.  Patient was given a total of 15,000 units of heparin to maintain ACT greater than 250.  Next an 035 angled Glidewire and a quick cross catheter were brought up and advanced as a unit up to the level of the occlusion.  I was then  able to reflux the Glidewire on itself and advanced the catheter over this.  Contrast angiogram was performed to assure that we were still within the true lumen.  A 5 x 80 angioplasty balloon was then advanced and centered on the lesion and inflated to 10 atm for 1 minute.  Completion contrast  angiogram showed multiple areas of dissection in the artery and I did not feel that drug-eluting balloon was going to take care of all of this.  Therefore at this point a 6 x 100 Eluvia drug-eluting stent was brought up in the field and centered on the lesion this was then deployed.  It was then postdilated with a 5 mm x 80 balloon with overlapping inflations to postdilated the entire stent.  Completion angiogram was performed which showed the stent was widely patent with no areas of dissection and preserved posterior tibial peroneal runoff.  At this point the sheath was pulled back into the left hemipelvis.  The guidewire was removed.  The sheath was thoroughly flushed with heparinized saline.  The sheath was left in place to be pulled in the holding area.  Patient tolerated procedure well and there were no complications patient was taken to holding area in stable condition.  Operative management: The patient will be started on Plavix and aspirin in combination.  This will be continued indefinitely.  The patient will follow-up with [place] in 3 months with a duplex ultrasound and ABIs.  Ruta Hinds, MD Vascular and Vein Specialists of [place] Office: [phone] Pager: [phone]

## 2018-05-19 NOTE — Telephone Encounter (Signed)
sch appt lvm mld ltr 08/24/18 ABI 11am LE art 1215pm p/o NP

## 2018-05-19 NOTE — Telephone Encounter (Signed)
Documentation that patient will need to be on DAPT indefinitely.

## 2018-05-19 NOTE — Telephone Encounter (Signed)
-----   Message from Mena Goes, RN sent at 05/19/2018 10:04 AM EDT ----- Regarding: 3 months    ----- Message ----- From: Elam Dutch, MD Sent: 05/19/2018   9:29 AM EDT To: Vvs Charge Pool  Procedure: Abdominal aortogram with bilateral lower extremity runoff right popliteal stent [place] groin   The patient will be started on Plavix and aspirin in combination.  This will be continued indefinitely.  The patient will follow-up with [place] in 3 months with a duplex ultrasound and ABIs.  He can be seen in the PA or NP clinic  Ruta Hinds, MD Vascular and Vein Specialists of [place] Office: [phone] Pager: [phone]

## 2018-05-19 NOTE — Telephone Encounter (Signed)
-----   Message from Elam Dutch, MD sent at 05/19/2018  9:29 AM EDT ----- Procedure: Abdominal aortogram with bilateral lower extremity runoff right popliteal stent [place] groin   The patient will be started on Plavix and aspirin in combination.  This will be continued indefinitely.  The patient will follow-up with [place] in 3 months with a duplex ultrasound and ABIs.  He can be seen in the PA or NP clinic  Ruta Hinds, MD Vascular and Vein Specialists of [place] Office: [phone] Pager: [phone]

## 2018-05-19 NOTE — Telephone Encounter (Signed)
-----   Message from Mena Goes, RN sent at 05/19/2018 10:04 AM EDT ----- Regarding: 3 months    ----- Message ----- From: Elam Dutch, MD Sent: 05/19/2018   9:29 AM EDT To: Vvs Charge Pool  Procedure: Abdominal aortogram with bilateral lower extremity runoff right popliteal stent [place] groin   The patient will be started on Plavix and aspirin in combination.  This will be continued indefinitely.  The patient will follow-up with [place] in 3 months with a duplex ultrasound and ABIs.  He can be seen in the PA or NP clinic  Ruta Hinds, MD Vascular and Vein Specialists of [place] Office: [phone] Pager: [phone]

## 2018-05-19 NOTE — Interval H&P Note (Signed)
History and Physical Interval Note:  05/19/2018 7:32 AM  Jim [name]  has presented today for surgery, with the diagnosis of pvd  The various methods of treatment have been discussed with the patient and family. After consideration of risks, benefits and other options for treatment, the patient has consented to  Procedure(s): ABDOMINAL AORTOGRAM W/LOWER EXTREMITY (N/A) as a surgical intervention .  The patient's history has been reviewed, patient examined, no change in status, stable for surgery.  I have reviewed the patient's chart and labs.  Questions were answered to the patient's satisfaction.     Ruta Hinds

## 2018-05-19 NOTE — Progress Notes (Addendum)
Site area: LFA Site Prior to Removal:  Level 0 Pressure Applied For: 30 min Manual:   yes Patient Status During Pull:   Post Pull Site:  Level 0 Post Pull Instructions Given: yes  Post Pull Pulses Present: doppler Dressing Applied:  Clear  Bedrest begins @ 1125 till 1525 Comments: pt sleeping throughout procedure. Reports back pain at 8/10

## 2018-05-19 NOTE — Discharge Instructions (Signed)
Hold METFORMIN 48 hours post procedure. May resume 10/20 evening  Femoral Site Care Refer to this sheet in the next few weeks. These instructions provide you with information about caring for yourself after your procedure. Your health care provider may also give you more specific instructions. Your treatment has been planned according to current medical practices, but problems sometimes occur. Call your health care provider if you have any problems or questions after your procedure. What can I expect after the procedure? After your procedure, it is typical to have the following:  Bruising at the site that usually fades within 1-2 weeks.  Blood collecting in the tissue (hematoma) that may be painful to the touch. It should usually decrease in size and tenderness within 1-2 weeks.  Follow these instructions at home:  Take medicines only as directed by your health care provider.  You may shower 24-48 hours after the procedure or as directed by your health care provider. Remove the bandage (dressing) and gently wash the site with plain soap and water. Pat the area dry with a clean towel. Do not rub the site, because this may cause bleeding.  Do not take baths, swim, or use a hot tub until your health care provider approves.  Check your insertion site every day for redness, swelling, or drainage.  Do not apply powder or lotion to the site.  Limit use of stairs to twice a day for the first 2-3 days or as directed by your health care provider.  Do not squat for the first 2-3 days or as directed by your health care provider.  Do not lift over 10 lb (4.5 kg) for 5 days after your procedure or as directed by your health care provider.  Ask your health care provider when it is okay to: ? Return to work or school. ? Resume usual physical activities or sports. ? Resume sexual activity.  Do not drive home if you are discharged the same day as the procedure. Have someone else drive you.  You may  drive 24 hours after the procedure unless otherwise instructed by your health care provider.  Do not operate machinery or power tools for 24 hours after the procedure or as directed by your health care provider.  If your procedure was done as an outpatient procedure, which means that you went home the same day as your procedure, a responsible adult should be with you for the first 24 hours after you arrive home.  Keep all follow-up visits as directed by your health care provider. This is important. Contact a health care provider if:  You have a fever.  You have chills.  You have increased bleeding from the site. Hold pressure on the site. Get help right away if:  You have unusual pain at the site.  You have redness, warmth, or swelling at the site.  You have drainage (other than a small amount of blood on the dressing) from the site.  If the site starts bleeding, hold steady pressure on the site and CALL 911.  Your leg or foot becomes pale, cool, tingly, or numb. This information is not intended to replace advice given to you by your health care provider. Make sure you discuss any questions you have with your health care provider. Document Released: 03/22/2014 Document Revised: 12/25/2015 Document Reviewed: 02/05/2014 Elsevier Interactive Patient Education  [employer].

## 2018-05-21 ENCOUNTER — Encounter: Payer: Self-pay | Admitting: Family Medicine

## 2018-05-21 DIAGNOSIS — I739 Peripheral vascular disease, unspecified: Secondary | ICD-10-CM | POA: Insufficient documentation

## 2018-05-21 HISTORY — DX: Peripheral vascular disease, unspecified: I73.9

## 2018-05-22 ENCOUNTER — Encounter (HOSPITAL_COMMUNITY): Payer: Self-pay | Admitting: Vascular Surgery

## 2018-05-24 MED FILL — Heparin Sodium (Porcine) Inj 1000 Unit/ML: INTRAMUSCULAR | Qty: 10 | Status: AC

## 2018-05-26 ENCOUNTER — Other Ambulatory Visit: Payer: Self-pay

## 2018-05-26 DIAGNOSIS — I70211 Atherosclerosis of native arteries of extremities with intermittent claudication, right leg: Secondary | ICD-10-CM

## 2018-06-13 ENCOUNTER — Other Ambulatory Visit: Payer: Self-pay | Admitting: Family Medicine

## 2018-06-14 ENCOUNTER — Ambulatory Visit: Payer: Medicaid Other | Admitting: Family Medicine

## 2018-06-14 ENCOUNTER — Encounter: Payer: Self-pay | Admitting: Family Medicine

## 2018-06-14 VITALS — BP 120/70 | Ht 69.0 in | Wt 244.0 lb

## 2018-06-14 DIAGNOSIS — Z23 Encounter for immunization: Secondary | ICD-10-CM | POA: Diagnosis not present

## 2018-06-14 DIAGNOSIS — E1140 Type 2 diabetes mellitus with diabetic neuropathy, unspecified: Secondary | ICD-10-CM | POA: Diagnosis not present

## 2018-06-14 DIAGNOSIS — E7849 Other hyperlipidemia: Secondary | ICD-10-CM | POA: Diagnosis not present

## 2018-06-14 DIAGNOSIS — I10 Essential (primary) hypertension: Secondary | ICD-10-CM | POA: Diagnosis not present

## 2018-06-14 MED ORDER — METFORMIN HCL 500 MG PO TABS: ORAL_TABLET | ORAL | 6 refills | Status: DC

## 2018-06-14 MED ORDER — DULOXETINE HCL 60 MG PO CPEP: ORAL_CAPSULE | ORAL | 6 refills | Status: DC

## 2018-06-14 MED ORDER — LISINOPRIL 5 MG PO TABS: 5.0000 mg | ORAL_TABLET | Freq: Every day | ORAL | 5 refills | Status: DC

## 2018-06-14 MED ORDER — EZETIMIBE 10 MG PO TABS: ORAL_TABLET | ORAL | 2 refills | Status: DC

## 2018-06-14 NOTE — Progress Notes (Signed)
   Subjective:    Patient ID: Jim [name], male    DOB: Dec 23, 1955, 62 y.o.   MRN: [id number]  HPI Patient is here today to follow up on his chronic illnesses.He is diabetic and takes Metformin 500 mg one bid. He eats healthy and does get exercise. He does see a pain management Dr. He is on Gabapentin 300 mg one Qd, and oxycodone 10 mg Q 4 hours prn.He reports he had a stent placed in right leg. Review of Systems  Constitutional: Negative for diaphoresis and fatigue.  HENT: Negative for congestion and rhinorrhea.   Respiratory: Negative for cough and shortness of breath.   Cardiovascular: Negative for chest pain and leg swelling.  Gastrointestinal: Negative for abdominal pain and diarrhea.  Skin: Negative for color change and rash.  Neurological: Negative for dizziness and headaches.  Psychiatric/Behavioral: Negative for behavioral problems and confusion.       Objective:   Physical Exam  Constitutional: He appears well-nourished. No distress.  HENT:  Head: Normocephalic and atraumatic.  Eyes: Right eye exhibits no discharge. Left eye exhibits no discharge.  Neck: No tracheal deviation present.  Cardiovascular: Normal rate, regular rhythm and normal heart sounds.  No murmur heard. Pulmonary/Chest: Effort normal and breath sounds normal. No respiratory distress.  Musculoskeletal: He exhibits no edema.  Lymphadenopathy:    He has no cervical adenopathy.  Neurological: He is alert. Coordination normal.  Skin: Skin is warm and dry.  Psychiatric: He has a normal mood and affect. His behavior is normal.  Vitals reviewed.         Assessment & Plan:  HTN- Patient was seen today as part of a visit regarding hypertension. The importance of healthy diet and regular physical activity was discussed. The importance of compliance with medications discussed.  Ideal goal is to keep blood pressure low elevated levels certainly below 716/96 when possible.  The patient was counseled that  keeping blood pressure under control lessen his risk of complications.  The importance of regular follow-ups was discussed with the patient.  Low-salt diet such as DASH recommended.  Regular physical activity was recommended as well.  Patient was advised to keep regular follow-ups.  Patient does have hyperlipidemia to be decided yet does not tolerate statins will watch his closely  Patient does have diabetes with peripheral vascular disease will continue try to keep cholesterol and control keep A1c under good control  A1c not quite where we want to be increase metformin follow-up patient again in several months time to recheck A1c

## 2018-06-28 ENCOUNTER — Encounter (HOSPITAL_COMMUNITY): Payer: Self-pay

## 2018-07-21 ENCOUNTER — Other Ambulatory Visit: Payer: Self-pay

## 2018-07-21 ENCOUNTER — Telehealth: Payer: Self-pay | Admitting: Family Medicine

## 2018-07-21 MED ORDER — TRAZODONE HCL 50 MG PO TABS: 50.0000 mg | ORAL_TABLET | Freq: Every day | ORAL | 5 refills | Status: DC

## 2018-07-21 NOTE — Telephone Encounter (Signed)
Please advise. Thank you

## 2018-07-21 NOTE — Telephone Encounter (Signed)
Ok plus five ref 

## 2018-07-21 NOTE — Telephone Encounter (Signed)
Patient requesting a refill on:  traZODone (DESYREL) 50 MG tablet  Switched pharmacy to [facility] in [place].

## 2018-07-21 NOTE — Telephone Encounter (Signed)
Pt.notified

## 2018-07-21 NOTE — Telephone Encounter (Signed)
Medication sent in. Left message to return call 

## 2018-08-22 NOTE — Progress Notes (Signed)
History of Present Illness:  Patient is a 63 y.o. year old male who presented on 05/15/2018 he was originally here for an evaluation of right lower extremity claudication.  He underwent angiogram by Dr. Oneida Alar with placement of right above the knee popliteal artery stent placement.  He is here today for follow up visit and repeat ABI's with arterial duplex.  He denise history of non healing wounds and rest pain.  He states his symptoms are gone and he is able to walk any distance he wants without right LE calf pain.    He has continued to take Aspirin and Plavix daily since his procedure.  He has not had nay new medical concerns or problems since his last visit.    Past Medical History:  Diagnosis Date  . Arthritis   . Back pain   . Bursitis of hip    Bilateral  . Depression   . 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 Ex-smoker    Quit 11/07/2011  . Gastric ulcer   . History of colon polyps   . Hyperlipidemia   . Primary localized osteoarthritis of right knee   . PVD (peripheral vascular disease) with claudication ([place]) 05/21/2018   Under the care of vascular surgery    Past Surgical History:  Procedure Laterality Date  . ABDOMINAL AORTOGRAM W/LOWER EXTREMITY N/A 05/19/2018   Procedure: ABDOMINAL AORTOGRAM W/LOWER EXTREMITY;  Surgeon: Elam Dutch, MD;  Location: [facility];  Service: Cardiovascular;  Laterality: N/A;  . CARPAL TUNNEL RELEASE     right  . CARPAL TUNNEL RELEASE Left 05/16/13   Dr Noemi Chapel  . COLONOSCOPY  01/31/2012   Procedure: COLONOSCOPY;  Surgeon: Daneil Dolin, MD;  [facility];  Service: Endoscopy;  Laterality: N/A;  7:30 AM  . FINGER AMPUTATION     partial left 5th finger  . JOINT REPLACEMENT    . KNEE ARTHROSCOPY     right knee  . ORIF FINGER FRACTURE     Right 5th finger, Amputation reatachment  . PERIPHERAL VASCULAR INTERVENTION Right 05/19/2018   Procedure: PERIPHERAL VASCULAR INTERVENTION;   Surgeon: Elam Dutch, MD;  Location: [facility];  Service: Cardiovascular;  Laterality: Right;  right popliteal  . TONSILLECTOMY    . TOTAL KNEE ARTHROPLASTY  09/04/2012   Procedure: TOTAL KNEE ARTHROPLASTY;  Surgeon: Lorn Junes, MD;  Location: [facility];  Service: Orthopedics;  Laterality: Left;  . TOTAL KNEE ARTHROPLASTY Right 07/21/2015   Procedure: RIGHT TOTAL KNEE ARTHROPLASTY;  Surgeon: Elsie Saas, MD;  Location: [facility];  Service: Orthopedics;  Laterality: Right;    ROS:   General:  No weight loss, Fever, chills  HEENT: No recent headaches, no nasal bleeding, no visual changes, no sore throat  Neurologic: No dizziness, blackouts, seizures. No recent symptoms of stroke or mini- stroke. No recent episodes of slurred speech, or temporary blindness.  Cardiac: No recent episodes of chest pain/pressure, no shortness of breath at rest.  No shortness of breath with exertion.  Denies history of atrial fibrillation or irregular heartbeat  Vascular: No history of rest pain in feet.  No history of claudication.  No history of non-healing ulcer, No history of DVT   Pulmonary: No home oxygen, no productive cough, no hemoptysis,  No asthma or wheezing  Musculoskeletal:  [ ]  Arthritis, [ ]  Low back pain,  [ ]  Joint pain  Hematologic:No history of hypercoagulable state.  No history of easy bleeding.  No history of anemia  Gastrointestinal: No hematochezia or melena,  No gastroesophageal reflux, no trouble swallowing  Urinary: [ ]  chronic Kidney disease, [ ]  on HD - [ ]  MWF or [ ]  TTHS, [ ]  Burning with urination, [ ]  Frequent urination, [ ]  Difficulty urinating;   Skin: No rashes  Psychological: No history of anxiety,  No history of depression  Social History Social History   Tobacco Use  . Smoking status: Former Smoker    Packs/day: 2.00    Years: 40.00    Pack years: 80.00    Types: Cigarettes    Last attempt to quit: 10/31/2011    Years since quitting: 6.8  .  Smokeless tobacco: Never Used  Substance Use Topics  . Alcohol use: Yes    Comment: 1 beer every 3-4 months  . Drug use: No    Family History Family History  Problem Relation Age of Onset  . Heart disease Mother   . Heart disease Father   . Diabetes type I Father   . Hypertension Father   . Cancer Father   . Heart disease Sister   . Heart disease Brother     Allergies  Allergies  Allergen Reactions  . Doxycycline Nausea Only  . Pravastatin Other (See Comments)    myalgia     Current Outpatient Medications  Medication Sig Dispense Refill  . albuterol (PROVENTIL HFA;VENTOLIN HFA) 108 (90 Base) MCG/ACT inhaler Inhale 2 puffs every 6 (six) hours as needed into the lungs for wheezing. (Patient taking differently: Inhale 2 puffs into the lungs every 6 (six) hours as needed for wheezing or shortness of breath. ) 1 Inhaler 6  . aspirin EC 325 MG tablet Take 1 tablet (325 mg total) by mouth daily. 100 tablet 3  . Cholecalciferol (VITAMIN D3 PO) Take 1 capsule by mouth daily.    . clopidogrel (PLAVIX) 75 MG tablet Take 1 tablet (75 mg total) by mouth daily. 30 tablet 11  . DULoxetine (CYMBALTA) 60 MG capsule take 1 capsule by mouth once daily 30 capsule 6  . ezetimibe (ZETIA) 10 MG tablet TAKE 1 TABLET(10 MG) BY MOUTH DAILY 90 tablet 2  . flintstones complete (FLINTSTONES) 60 MG chewable tablet Chew 1 tablet by mouth daily.    Marland Kitchen gabapentin (NEURONTIN) 300 MG capsule Take 300 mg by mouth every evening.    Marland Kitchen lisinopril (PRINIVIL,ZESTRIL) 5 MG tablet Take 1 tablet (5 mg total) by mouth daily. 30 tablet 5  . metFORMIN (GLUCOPHAGE) 500 MG tablet 2 qam and 1q supper 90 tablet 6  . Oxycodone HCl 20 MG TABS TK 1/2-1 T 3-5 TIMES DAILY IF TOLERATED    . traZODone (DESYREL) 50 MG tablet Take 1 tablet (50 mg total) by mouth at bedtime. 30 tablet 5  . triamcinolone cream (KENALOG) 0.1 % Apply bid prn (Patient taking differently: Apply 1 application topically 2 (two) times daily as needed (for  rash). ) 45 g 4   No current facility-administered medications for this visit.     Physical Examination  Vitals:   08/24/18 1112  BP: 123/67  Pulse: 72  Resp: 20  Temp: (!) 97.3 F (36.3 C)  SpO2: 95%  Weight: 240 lb (108.9 kg)  Height: 5\' 9"  (1.753 m)    Body mass index is 35.44 kg/m.  General:  Alert and oriented, no acute distress HEENT: Normal, normocephalic Neck: No bruit or JVD Pulmonary: Clear to auscultation bilaterally Cardiac: Regular Rate and Rhythm  without murmur Abdomen: Soft, non-tender, non-distended, no mass, no scars Skin: No rash Extremity Pulses:  2+ radial, brachial, femoral, dorsalis pedis, left posterior tibial pulses  Musculoskeletal: No deformity or edema, well healed B Knee incisions  Neurologic: Upper and lower extremity motor 5/5 and grossly symmetric  DATA:     Right Duplex Findings: +-----------+--------+-----+---------------+---------+------------------+            PSV cm/sRatioStenosis       Waveform Comments           +-----------+--------+-----+---------------+---------+------------------+ CFA Distal 185          30-49% stenosisbiphasic                    +-----------+--------+-----+---------------+---------+------------------+ DFA        99                          triphasic                   +-----------+--------+-----+---------------+---------+------------------+ SFA Prox   108                         triphasic                   +-----------+--------+-----+---------------+---------+------------------+ SFA Mid    146                         triphasicUpper end of range +-----------+--------+-----+---------------+---------+------------------+ SFA Distal 201          50-74% stenosistriphasicStent              +-----------+--------+-----+---------------+---------+------------------+ POP Prox   129                         triphasic                    +-----------+--------+-----+---------------+---------+------------------+ POP Mid    90                          triphasic                   +-----------+--------+-----+---------------+---------+------------------+ POP Distal 86                          triphasic                   +-----------+--------+-----+---------------+---------+------------------+ ATA Distal 60                          triphasic                   +-----------+--------+-----+---------------+---------+------------------+ PTA Prox   81                          triphasic                   +-----------+--------+-----+---------------+---------+------------------+ PTA Mid    82                          triphasic                   +-----------+--------+-----+---------------+---------+------------------+ PTA Distal 92  triphasic                   +-----------+--------+-----+---------------+---------+------------------+ PERO Prox  50                          triphasic                   +-----------+--------+-----+---------------+---------+------------------+ PERO Mid   68                          triphasic                   +-----------+--------+-----+---------------+---------+------------------+ PERO Distal32                          triphasic                   +-----------+--------+-----+---------------+---------+------------------+    Right Stent(s): +---------------+---+---------------+---------+------------------+ Prox to Stent  20150-99% stenosistriphasicWith PST           +---------------+---+---------------+---------+------------------+ Proximal Stent 25450-99% stenosistriphasicWith PST           +---------------+---+---------------+---------+------------------+ Mid Stent      102               triphasic                   +---------------+---+---------------+---------+------------------+ Distal Stent   109                triphasic                   +---------------+---+---------------+---------+------------------+ Distal to [id number]               triphasicUpper end of range +---------------+---+---------------+---------+------------------+  Narrowing at inflow artery and proximal stent end with increased velocities indicating a 50-99% stenosis with post stenotic turbulence noted.     Summary: Right: 30-49% stenosis noted in the common femoral artery. Narrowing at inflow artery and proximal stent end with increased velocities indicating a 50-99% stenosis with post stenotic turbulence noted.      ABI Findings: +---------+------------------+-----+---------+--------+ Right    Rt Pressure (mmHg)IndexWaveform Comment  +---------+------------------+-----+---------+--------+ Brachial 124                    triphasic         +---------+------------------+-----+---------+--------+ PTA      124               1.00 triphasic         +---------+------------------+-----+---------+--------+ DP       123               0.99 triphasic         +---------+------------------+-----+---------+--------+ Great Toe95                0.77 Normal            +---------+------------------+-----+---------+--------+  +---------+------------------+-----+---------+-------+ Left     Lt Pressure (mmHg)IndexWaveform Comment +---------+------------------+-----+---------+-------+ Brachial 122                    triphasic        +---------+------------------+-----+---------+-------+ PTA      126               1.02 triphasic        +---------+------------------+-----+---------+-------+ DP       106  0.85 biphasic         +---------+------------------+-----+---------+-------+ Great Toe89                0.72 Normal            +---------+------------------+-----+---------+-------+  +-------+-----------+-----------+------------+------------+ ABI/TBIToday's ABIToday's TBIPrevious ABIPrevious TBI +-------+-----------+-----------+------------+------------+ Right  1.00       0.77                                +-------+-----------+-----------+------------+------------+ Left   1.02       0.72                                +-------+-----------+-----------+------------+------------+    ASSESSMENT:  Right LE claudication symptoms  Operative findings: #1 left leg 50% left SFA stenosis two-vessel runoff very diseased peroneal artery patent posterior tibial artery occluded anterior tibial artery  2.  Right popliteal artery occlusion with reconstitution above the knee two-vessel runoff via the posterior tibial and peroneal arteries  3.  Recanalization of above-knee popliteal artery and stenting to residual 0% stenosis with 6 x 100 Eluvia drug-eluting stent  PLAN:  He denise symptoms of claudication in his daily activities.   The Arterial duplex shows narrowing in the proximal stent with velocities of 200.  We will continue to watch this.  The flow is triphasic with TBI of 72% on the right.  If the velocities increase and are 3000 or greater he will be scheduled for an angiogram.    Post procedure he was started on Plavix and aspirin in combination.  This will be continued indefinitely.   He knows what his symptoms were prior to the procedure and if he has problems or concerns he will call, otherwise we will see him back in 3 months for repeat arterial duplex and ABI's.     Roxy Horseman PA-C [facility] Office: [phone]  MD in clinic [place]

## 2018-08-24 ENCOUNTER — Ambulatory Visit (HOSPITAL_COMMUNITY)
Admission: RE | Admit: 2018-08-24 | Discharge: 2018-08-24 | Disposition: A | Discharge status: Home / Self Care | Payer: Medicaid Other | Source: Ambulatory Visit | Attending: Family | Admitting: Family

## 2018-08-24 ENCOUNTER — Ambulatory Visit (INDEPENDENT_AMBULATORY_CARE_PROVIDER_SITE_OTHER)
Admit: 2018-08-24 | Discharge: 2018-08-24 | Disposition: A | Discharge status: Home / Self Care | Payer: Medicaid Other | Attending: Family | Admitting: Family

## 2018-08-24 ENCOUNTER — Other Ambulatory Visit: Payer: Self-pay

## 2018-08-24 ENCOUNTER — Ambulatory Visit (INDEPENDENT_AMBULATORY_CARE_PROVIDER_SITE_OTHER): Payer: Medicaid Other | Admitting: Physician Assistant

## 2018-08-24 VITALS — BP 123/67 | HR 72 | Temp 97.3°F | Resp 20 | Ht 69.0 in | Wt 240.0 lb

## 2018-08-24 DIAGNOSIS — I70211 Atherosclerosis of native arteries of extremities with intermittent claudication, right leg: Secondary | ICD-10-CM | POA: Diagnosis not present

## 2018-09-14 LAB — HM DIABETES EYE EXAM

## 2018-09-15 ENCOUNTER — Encounter: Payer: Self-pay | Admitting: Family Medicine

## 2018-09-15 ENCOUNTER — Ambulatory Visit: Payer: Medicaid Other | Admitting: Family Medicine

## 2018-09-15 VITALS — BP 122/84 | Ht 69.0 in | Wt 240.0 lb

## 2018-09-15 DIAGNOSIS — F411 Generalized anxiety disorder: Secondary | ICD-10-CM

## 2018-09-15 DIAGNOSIS — R454 Irritability and anger: Secondary | ICD-10-CM | POA: Diagnosis not present

## 2018-09-15 DIAGNOSIS — F321 Major depressive disorder, single episode, moderate: Secondary | ICD-10-CM

## 2018-09-15 MED ORDER — FLUOXETINE HCL 40 MG PO CAPS: 40.0000 mg | ORAL_CAPSULE | Freq: Every day | ORAL | 3 refills | Status: DC
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
## 2018-09-18 NOTE — Progress Notes (Signed)
Referral ordered in Epic. 

## 2018-09-18 NOTE — Addendum Note (Signed)
Addended by: Dairl Ponder on: 09/18/2018 09:03 AM   Modules accepted: Orders

## 2018-09-22 ENCOUNTER — Encounter: Payer: Self-pay | Admitting: Family Medicine

## 2018-09-29 ENCOUNTER — Ambulatory Visit: Payer: Medicaid Other | Admitting: Family Medicine

## 2018-09-29 ENCOUNTER — Encounter: Payer: Self-pay | Admitting: Family Medicine

## 2018-09-29 VITALS — BP 110/80 | Ht 69.0 in | Wt 232.0 lb

## 2018-09-29 DIAGNOSIS — E7849 Other hyperlipidemia: Secondary | ICD-10-CM

## 2018-09-29 DIAGNOSIS — I10 Essential (primary) hypertension: Secondary | ICD-10-CM | POA: Diagnosis not present

## 2018-09-29 DIAGNOSIS — E1140 Type 2 diabetes mellitus with diabetic neuropathy, unspecified: Secondary | ICD-10-CM

## 2018-09-29 DIAGNOSIS — F321 Major depressive disorder, single episode, moderate: Secondary | ICD-10-CM | POA: Diagnosis not present

## 2018-09-29 NOTE — Progress Notes (Addendum)
   Subjective:    Patient ID: Jim [name], male    DOB: [birth year], 63 y.o.   MRN: [id number]  Depression         This is a chronic problem.  Associated symptoms include no headaches. follow up on changing from cymbalta to prozac. Pt states med is working well. No concerns today.   Very nice patient Does take pain medicine for chronic pain Follows with the management center They are doing some counseling there for his depression and have decreased the belt of his pain medicine He states his depression doing better on the current medication.  He is feeling better.  Denies being suicidal.  Review of Systems  Constitutional: Negative for activity change.  HENT: Negative for congestion and rhinorrhea.   Respiratory: Negative for cough and shortness of breath.   Cardiovascular: Negative for chest pain.  Gastrointestinal: Negative for abdominal pain, diarrhea, nausea and vomiting.  Genitourinary: Negative for dysuria and hematuria.  Neurological: Negative for weakness and headaches.  Psychiatric/Behavioral: Positive for depression. Negative for behavioral problems and confusion.       Objective:   Physical Exam Vitals signs reviewed.  Cardiovascular:     Rate and Rhythm: Normal rate and regular rhythm.     Heart sounds: Normal heart sounds. No murmur.  Pulmonary:     Effort: Pulmonary effort is normal.     Breath sounds: Normal breath sounds.  Lymphadenopathy:     Cervical: No cervical adenopathy.  Neurological:     Mental Status: He is alert.  Psychiatric:        Behavior: Behavior normal.     15 minutes was spent with patient today discussing healthcare issues which they came.  More than 50% of this visit-total duration of visit-was spent in counseling and coordination of care.  Please see diagnosis regarding the focus of this coordination and care       Assessment & Plan:  Blood pressure good control continue current measures  Lab work before next visit to look at  cholesterol and A1c  Depression doing better on current medication continue the medicine.  Recommend patient to follow-up if progressive troubles otherwise we will see him back in about 3 to 4 months.  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  Monofilament testing shows neuropathy in the feet.  Reportedly painful by the patient's account.  Patient does have some pre-ulcerative calluses.

## 2018-11-08 ENCOUNTER — Other Ambulatory Visit: Payer: Self-pay

## 2018-11-08 DIAGNOSIS — I70211 Atherosclerosis of native arteries of extremities with intermittent claudication, right leg: Secondary | ICD-10-CM

## 2018-11-23 ENCOUNTER — Encounter (HOSPITAL_COMMUNITY): Payer: Self-pay

## 2018-11-23 ENCOUNTER — Ambulatory Visit: Payer: Self-pay | Admitting: Family

## 2018-11-23 ENCOUNTER — Telehealth: Payer: Self-pay | Admitting: Family Medicine

## 2018-11-23 NOTE — Telephone Encounter (Signed)
Please advise. Thank you

## 2018-11-23 NOTE — Telephone Encounter (Signed)
Patient is requesting a prescription for new pair of diabetic shoes to be called into [place].

## 2018-11-24 NOTE — Telephone Encounter (Signed)
This would require an in-office visit I would recommend that that be made mid May or later depending on patient's comfort level

## 2018-11-25 IMAGING — DX DG HIP (WITH OR WITHOUT PELVIS) 4+V*R*
3 series · 3 of 3 positions shown · non-contrast
Comparison: None in PACs

CLINICAL DATA: Right hip pain for several years. No known injury.
History of bilateral hip bursitis.

EXAM:
DG HIP (WITH OR WITHOUT PELVIS) 4+V RIGHT

[pelvis ap]
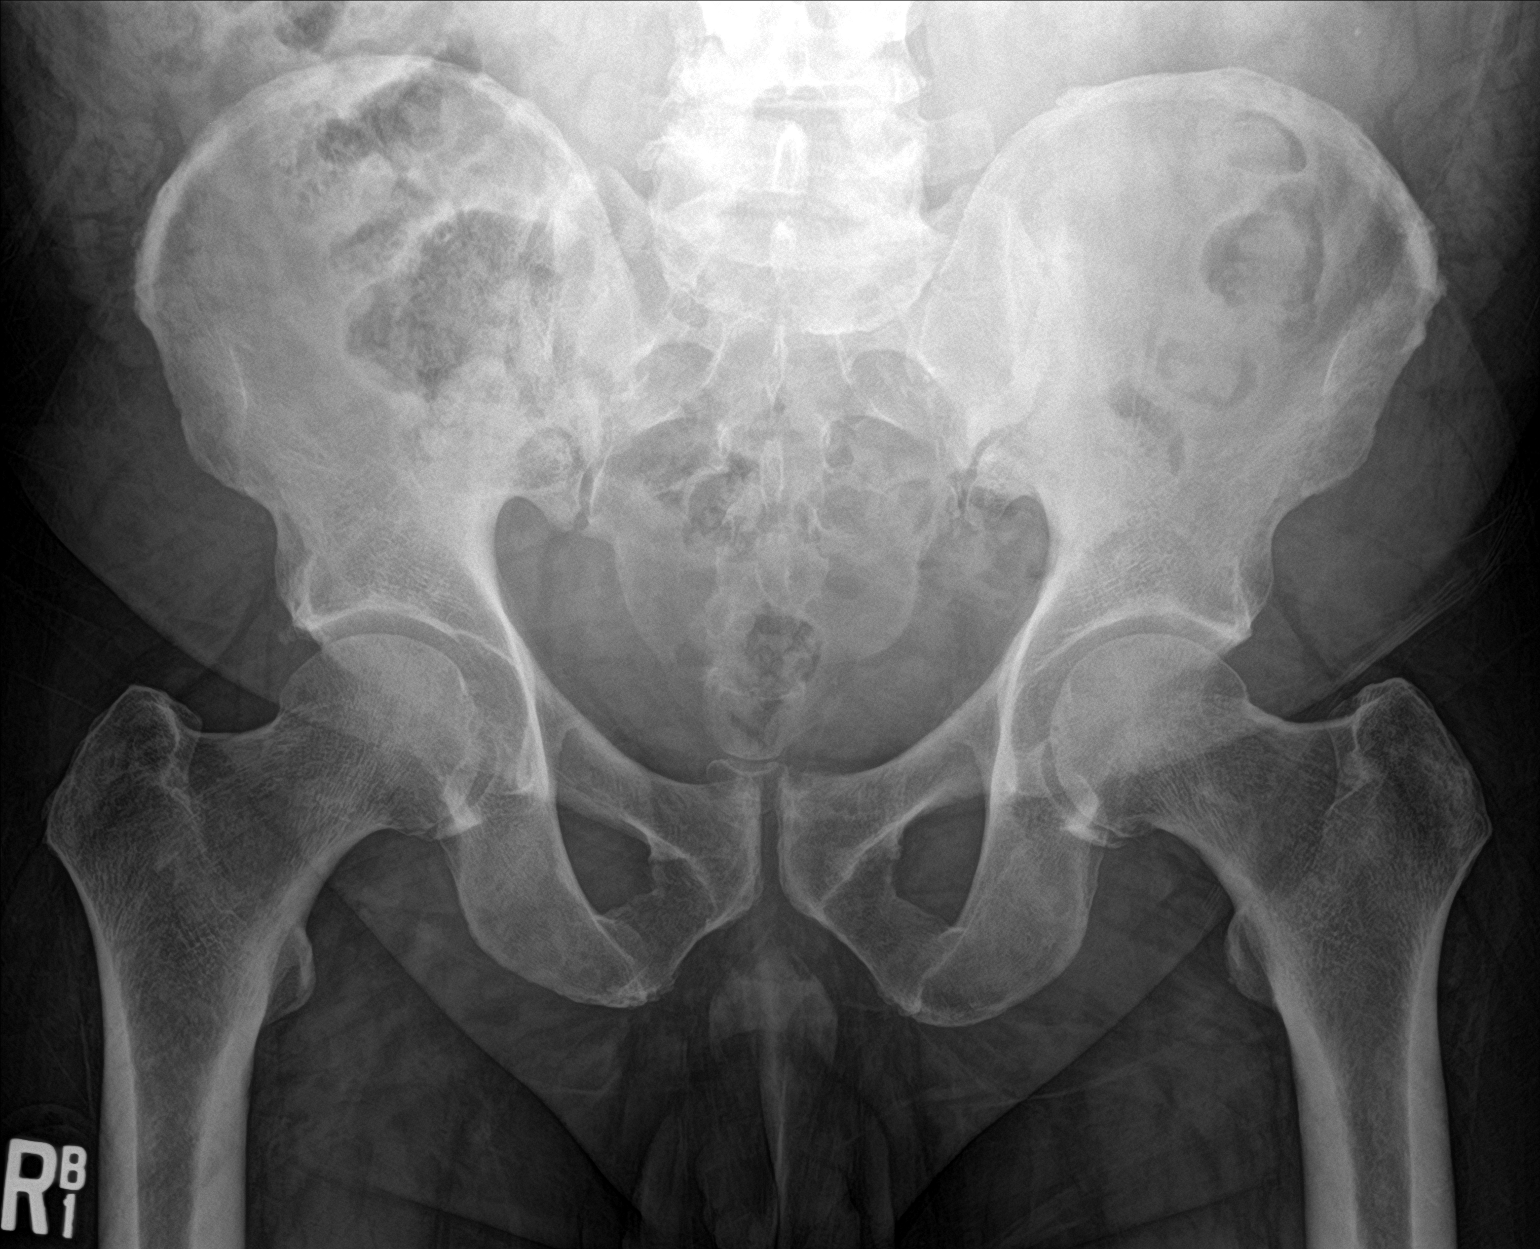

[hip ap]
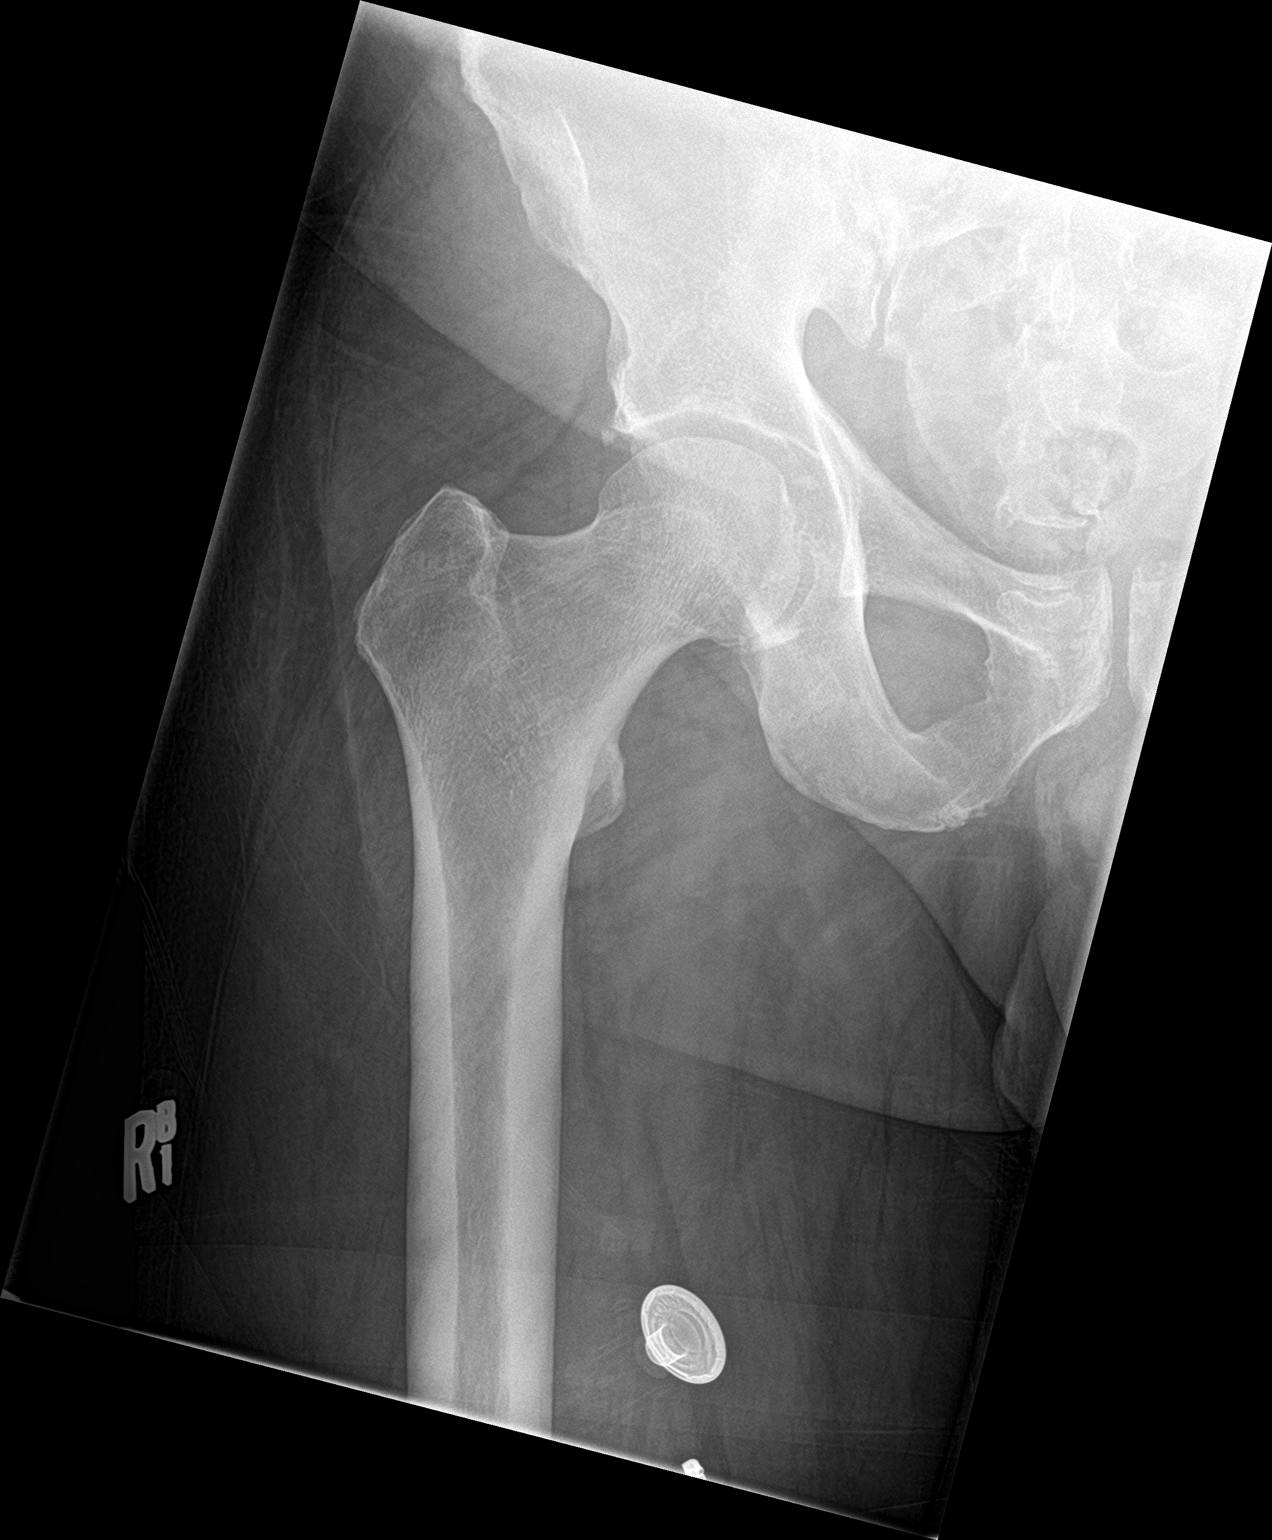

[hip lat]
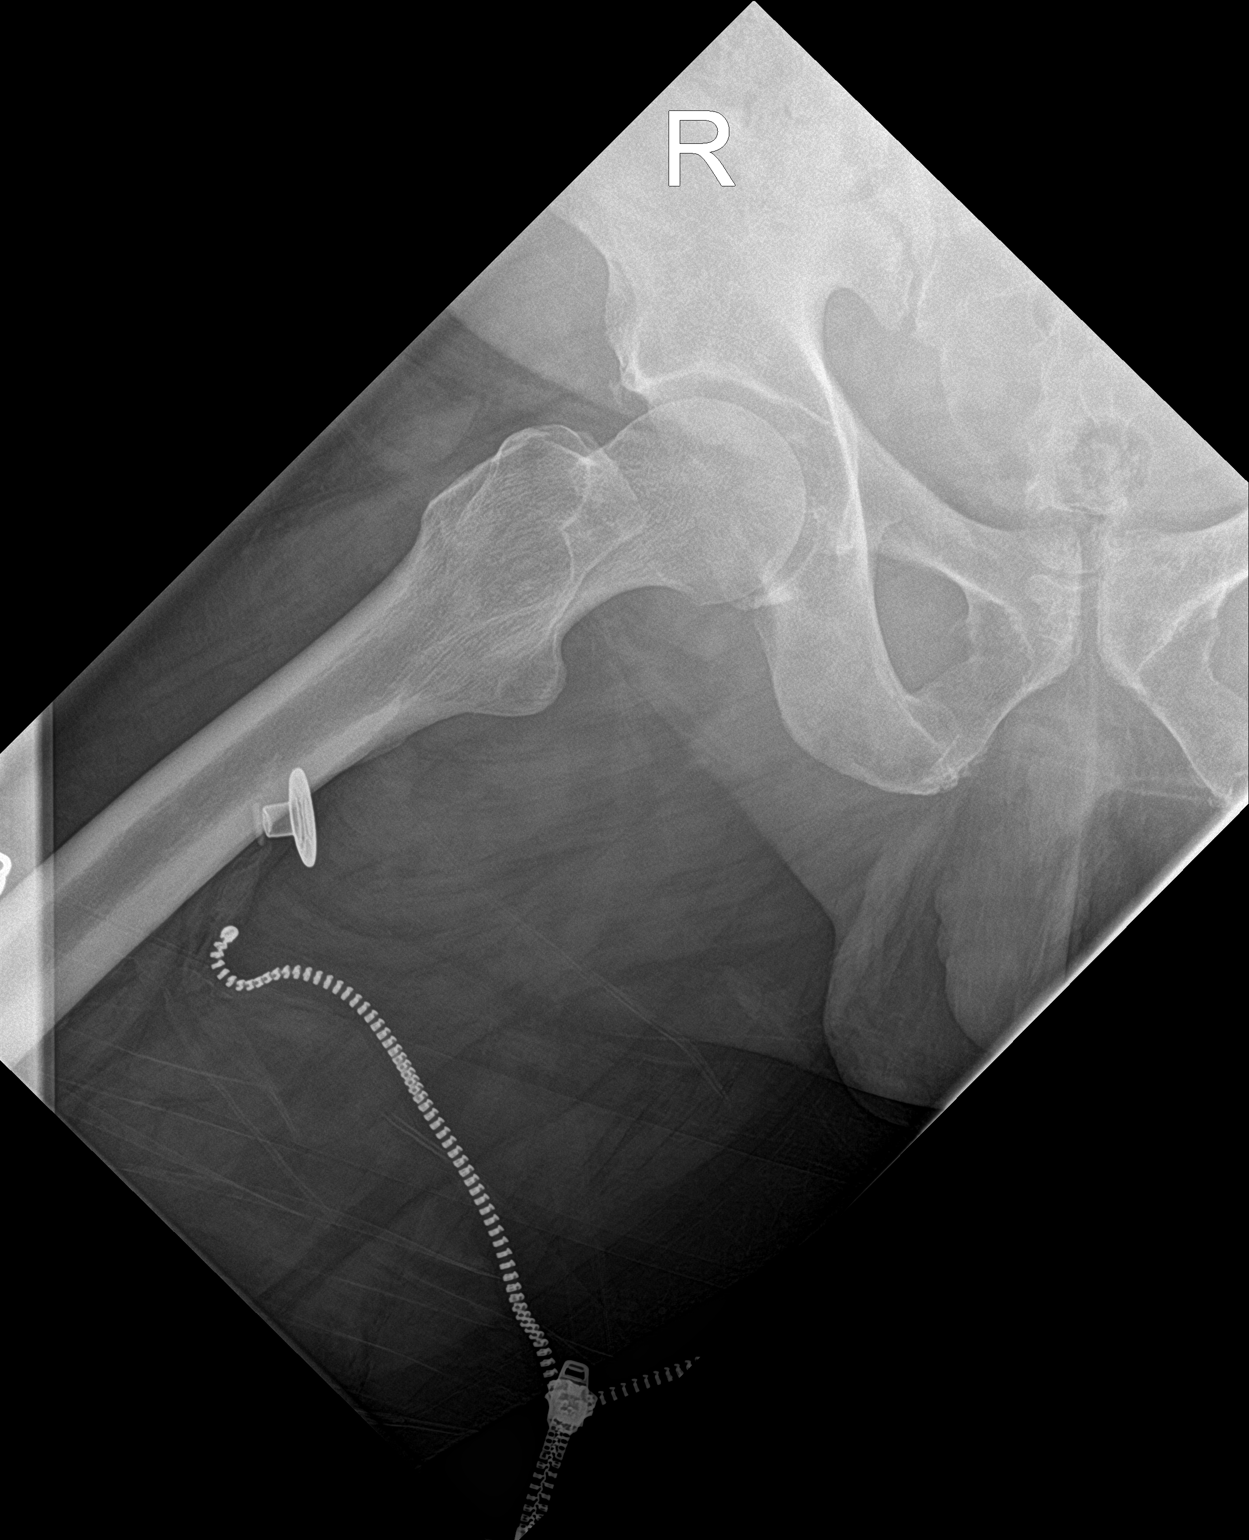

[3 of 3 positions shown; findings below may reference images not displayed]

FINDINGS: The bony pelvis is subjectively adequately mineralized. There is no
lytic nor blastic lesion. AP and lateral views of the right hip
reveal preservation of the joint space. The articular surfaces of
the femoral head and acetabulum remains smoothly rounded. The
femoral neck, intertrochanteric, and subtrochanteric regions are
normal.
IMPRESSION: There is no acute or significant chronic bony abnormality of the
right hip.

## 2018-11-27 NOTE — Telephone Encounter (Signed)
Left message to return call 

## 2018-11-27 NOTE — Telephone Encounter (Signed)
I can do a virtual visit regarding this, and that visit can be this week

## 2018-11-27 NOTE — Telephone Encounter (Signed)
Pt has set up appt for tomorrow

## 2018-11-27 NOTE — Telephone Encounter (Signed)
Pt called back to office. Pt states that he had not heard anything back regarding diabetic shoes. Informed patient that this would require and office visit. Pt states that provider sent in diabetic shoes last year. Pt states his feet are very painful. Pt states the pain med he is taking (Oxycodone 20 mg) is not helping. Pt also states he is having burning, tingling, and throbbing. Please advise. Thank you

## 2018-11-28 ENCOUNTER — Ambulatory Visit (INDEPENDENT_AMBULATORY_CARE_PROVIDER_SITE_OTHER): Payer: Medicaid Other | Admitting: Family Medicine

## 2018-11-28 ENCOUNTER — Other Ambulatory Visit: Payer: Self-pay

## 2018-11-28 ENCOUNTER — Telehealth: Payer: Self-pay | Admitting: Family Medicine

## 2018-11-28 DIAGNOSIS — E1140 Type 2 diabetes mellitus with diabetic neuropathy, unspecified: Secondary | ICD-10-CM

## 2018-11-28 NOTE — Telephone Encounter (Signed)
Patient requesting diabetic shoes  Please write out order for diabetic shoes with diagnosis of diabetic neuropathy In addition to this order the office visit notes from 11/28/2018 as well as 09/29/2018 will need to be sent with this order to [facility] Thank you

## 2018-11-28 NOTE — Progress Notes (Signed)
   Subjective:    Patient ID: Jim [name], male    DOB: Oct 24, 1955, 63 y.o.   MRN: [id number]  HPI  Format- video  Patient present at home Provider present at office Consent for interaction obtained Coronavirus outbreak made virtual visit necessary   Patient calls with bilateral foot pain for one month. Patient states his current orthotic shoes from [place] are worn out as well as all the insoles they gave him and he needs a new pair.  Virtual Visit via Video Note  I connected with [name] on 11/28/18 at 10:00 AM EDT by a video enabled telemedicine application and verified that I am speaking with the correct person using two identifiers.   I discussed the limitations of evaluation and management by telemedicine and the availability of in person appointments. The patient expressed understanding and agreed to proceed.  History of Present Illness:    Observations/Objective:   Assessment and Plan:   Follow Up Instructions:    I discussed the assessment and treatment plan with the patient. The patient was provided an opportunity to ask questions and all were answered. The patient agreed with the plan and demonstrated an understanding of the instructions.   The patient was advised to call back or seek an in-person evaluation if the symptoms worsen or if the condition fails to improve as anticipated.  I provided 15 minutes of non-face-to-face time during this encounter.   Patient previously has had a monofilament testing of his feet which showed neuropathy please see dictation from 09/29/2018  Review of Systems     Objective:   Physical Exam Patient relates significant burning in the feet numbness tingling.  He has a known history of diabetes with numbness.  He has used diabetic shoes in the past and this is been well over 1 year so he needs a new prescription for this it is very reasonable for this patient to get this will help prevent any type of ulcers  on his feet.  The purpose of this visit was to document his neuropathy in order to qualify him for diabetic shoes     Assessment & Plan:  Diabetic shoes as requested Diabetic neuropathy Please see documentation from previous office visit

## 2018-11-29 NOTE — Telephone Encounter (Signed)
Office visit notes and rx ready and waiting for dr scott to sign then will fax and call pt.

## 2018-12-01 NOTE — Telephone Encounter (Signed)
Faxed papers to [facility]. Left message to return call

## 2018-12-04 NOTE — Telephone Encounter (Signed)
Left message to return call 

## 2018-12-04 NOTE — Telephone Encounter (Signed)
Pt.notified

## 2018-12-13 ENCOUNTER — Ambulatory Visit: Payer: Medicaid Other | Admitting: Family Medicine

## 2019-01-02 ENCOUNTER — Telehealth: Payer: Self-pay | Admitting: Family Medicine

## 2019-01-02 DIAGNOSIS — M2550 Pain in unspecified joint: Secondary | ICD-10-CM

## 2019-01-02 NOTE — Telephone Encounter (Signed)
Patient said his pain medicine doctor wants him to have an arthritis panel done and wants [place] to order it.  Wants it added to lab orders already in the chart.

## 2019-01-03 NOTE — Telephone Encounter (Signed)
Go ahead and also order ANA, rheumatoid factor, sed rate-diagnosis arthralgia Make sure to explain to the patient when he goes to get the blood work done he needs to specifically tell the lab core people that he is doing the blood work previously ordered plus also the new lab work ordered otherwise they will only do one or the other

## 2019-01-03 NOTE — Telephone Encounter (Signed)
Orders placed. I called and left a vm asked for the pt to r/c.

## 2019-01-04 NOTE — Telephone Encounter (Signed)
Left message to return call 

## 2019-01-04 NOTE — Telephone Encounter (Signed)
Patient notified and verbalized understanding. 

## 2019-01-15 ENCOUNTER — Other Ambulatory Visit: Payer: Self-pay | Admitting: Family Medicine

## 2019-01-17 ENCOUNTER — Other Ambulatory Visit: Payer: Self-pay | Admitting: Family Medicine

## 2019-01-22 ENCOUNTER — Other Ambulatory Visit: Payer: Self-pay

## 2019-01-22 ENCOUNTER — Ambulatory Visit: Payer: Medicaid Other | Admitting: Family Medicine

## 2019-03-06 LAB — ANA: Anti Nuclear Antibody (ANA): NEGATIVE

## 2019-03-06 LAB — RHEUMATOID FACTOR: Rhuematoid fact SerPl-aCnc: 10.2 IU/mL (ref 0.0–13.9)

## 2019-03-06 LAB — SEDIMENTATION RATE: Sed Rate: 20 mm/hr (ref 0–30)

## 2019-03-08 ENCOUNTER — Ambulatory Visit (INDEPENDENT_AMBULATORY_CARE_PROVIDER_SITE_OTHER): Payer: Medicaid Other | Admitting: Family Medicine

## 2019-03-08 ENCOUNTER — Telehealth: Payer: Self-pay | Admitting: *Deleted

## 2019-03-08 ENCOUNTER — Other Ambulatory Visit: Payer: Self-pay

## 2019-03-08 DIAGNOSIS — I10 Essential (primary) hypertension: Secondary | ICD-10-CM

## 2019-03-08 DIAGNOSIS — E7849 Other hyperlipidemia: Secondary | ICD-10-CM

## 2019-03-08 DIAGNOSIS — G894 Chronic pain syndrome: Secondary | ICD-10-CM | POA: Diagnosis not present

## 2019-03-08 DIAGNOSIS — E1140 Type 2 diabetes mellitus with diabetic neuropathy, unspecified: Secondary | ICD-10-CM | POA: Diagnosis not present

## 2019-03-08 DIAGNOSIS — M1990 Unspecified osteoarthritis, unspecified site: Secondary | ICD-10-CM

## 2019-03-08 DIAGNOSIS — Z79899 Other long term (current) drug therapy: Secondary | ICD-10-CM

## 2019-03-08 DIAGNOSIS — Z125 Encounter for screening for malignant neoplasm of prostate: Secondary | ICD-10-CM

## 2019-03-08 MED ORDER — LISINOPRIL 5 MG PO TABS: 5.0000 mg | ORAL_TABLET | Freq: Every day | ORAL | 5 refills | Status: DC

## 2019-03-08 MED ORDER — TRAZODONE HCL 50 MG PO TABS: ORAL_TABLET | ORAL | 5 refills | Status: DC

## 2019-03-08 MED ORDER — EZETIMIBE 10 MG PO TABS: ORAL_TABLET | ORAL | 3 refills | Status: DC

## 2019-03-08 MED ORDER — METFORMIN HCL 500 MG PO TABS: ORAL_TABLET | ORAL | 5 refills | Status: DC

## 2019-03-08 MED ORDER — FLUOXETINE HCL 20 MG PO CAPS: 60.0000 mg | ORAL_CAPSULE | Freq: Every day | ORAL | 5 refills | Status: DC

## 2019-03-08 MED ORDER — FLUOXETINE HCL 20 MG PO TABS: ORAL_TABLET | ORAL | 5 refills | Status: DC

## 2019-03-08 NOTE — Addendum Note (Signed)
Addended by: Dairl Ponder on: 03/08/2019 04:51 PM   Modules accepted: Orders

## 2019-03-08 NOTE — Telephone Encounter (Signed)
Medicaid denied Prozac 20mg  tablets- Preferred is prozac capsules- may prescription be switched to capsules

## 2019-03-08 NOTE — Telephone Encounter (Signed)
Prescription sent electronically to pharmacy. 

## 2019-03-08 NOTE — Telephone Encounter (Signed)
I certainly agree with that please go with what is approved!!!

## 2019-03-08 NOTE — Progress Notes (Signed)
   Subjective:    Patient ID: Jim [name], male    DOB: 04/06/1956, 63 y.o.   MRN: [id number]  Diabetes He presents for his follow-up diabetic visit. He has type 2 diabetes mellitus. There are no hypoglycemic associated symptoms. Pertinent negatives for hypoglycemia include no confusion, dizziness or headaches. There are no diabetic associated symptoms. Pertinent negatives for diabetes include no chest pain and no fatigue. There are no hypoglycemic complications. There are no diabetic complications. He sees a podiatrist.Eye exam is current.   Virtual Visit via Video Note  I connected with Jim [name] on 03/08/19 at  9:30 AM EDT by a video enabled telemedicine application and verified that I am speaking with the correct person using two identifiers.  Location: Patient: home Provider:office   I discussed the limitations of evaluation and management by telemedicine and the availability of in person appointments. The patient expressed understanding and agreed to proceed.  History of Present Illness:    Observations/Objective:   Assessment and Plan:   Follow Up Instructions:    I discussed the assessment and treatment plan with the patient. The patient was provided an opportunity to ask questions and all were answered. The patient agreed with the plan and demonstrated an understanding of the instructions.   The patient was advised to call back or seek an in-person evaluation if the symptoms worsen or if the condition fails to improve as anticipated.  I provided 18 minutes of non-face-to-face time during this encounter.   Vicente Males, LPN     Review of Systems  Constitutional: Negative for diaphoresis and fatigue.  HENT: Negative for congestion and rhinorrhea.   Respiratory: Negative for cough and shortness of breath.   Cardiovascular: Negative for chest pain and leg swelling.  Gastrointestinal: Negative for abdominal pain and diarrhea.  Skin: Negative for color  change and rash.  Neurological: Negative for dizziness and headaches.  Psychiatric/Behavioral: Negative for behavioral problems and confusion.       Objective:   Physical Exam  Today's visit was via telephone Physical exam was not possible for this visit  Approximately 17 to 20 minutes spent with patient discussing his multiple chronic health issues     Assessment & Plan:  Comprehensive lab work Annual wellness exam plus chronic health issues later this year Diabetes stable check lab work await results previous labs overall okay Blood pressure per patient doing okay continue current measures

## 2019-04-06 LAB — HM DIABETES EYE EXAM

## 2019-04-13 ENCOUNTER — Encounter: Payer: Self-pay | Admitting: Family Medicine

## 2019-04-19 ENCOUNTER — Other Ambulatory Visit: Payer: Self-pay

## 2019-04-19 DIAGNOSIS — Z20822 Contact with and (suspected) exposure to covid-19: Secondary | ICD-10-CM

## 2019-04-21 LAB — NOVEL CORONAVIRUS, NAA: SARS-CoV-2, NAA: NOT DETECTED

## 2019-05-17 ENCOUNTER — Telehealth: Payer: Self-pay | Admitting: Family Medicine

## 2019-05-17 NOTE — Telephone Encounter (Signed)
Please send her a my chart message or call her stating that we highly recommend an office visit for Jim [name] regarding this so we can discuss in detail follow-up

## 2019-05-17 NOTE — Telephone Encounter (Signed)
Pt wife sent a MyChart message. Pt wife is not on DPR.   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 He is having a hard time making decisions, sticking to a task and his memory seems lessened. Example: I was cooking potatoes and had left the room for a minute and when I came back the potatoes were missing. He had put them in a bowl and set them in the cabinet. When I asked him about them he couldn't remember at first where he had put them.   He walks around in a daze giggling to his self. The list goes on.   I'm going to try to talk with him to schedule an appointment with you. I know I can't come in with him so I hope this will give you a heads up.  Hopefully with a CT or PET and blood work we can narrow down what is really going on with him.   Sincerely, [name] Sallee Provencal
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
## 2019-05-18 ENCOUNTER — Ambulatory Visit (INDEPENDENT_AMBULATORY_CARE_PROVIDER_SITE_OTHER): Payer: Medicaid Other | Admitting: Nurse Practitioner

## 2019-05-18 ENCOUNTER — Other Ambulatory Visit: Payer: Self-pay

## 2019-05-18 DIAGNOSIS — Z1283 Encounter for screening for malignant neoplasm of skin: Secondary | ICD-10-CM | POA: Diagnosis not present

## 2019-05-18 DIAGNOSIS — M545 Low back pain: Secondary | ICD-10-CM | POA: Diagnosis not present

## 2019-05-18 DIAGNOSIS — M79661 Pain in right lower leg: Secondary | ICD-10-CM | POA: Diagnosis not present

## 2019-05-18 DIAGNOSIS — S81801A Unspecified open wound, right lower leg, initial encounter: Secondary | ICD-10-CM

## 2019-05-18 MED ORDER — DOXYCYCLINE HYCLATE 100 MG PO TABS: 100.0000 mg | ORAL_TABLET | Freq: Two times a day (BID) | ORAL | 0 refills | Status: DC

## 2019-05-18 NOTE — Telephone Encounter (Signed)
See office note 05/18/19
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
## 2019-05-19 ENCOUNTER — Encounter: Payer: Self-pay | Admitting: Nurse Practitioner

## 2019-05-23 LAB — HEPATIC FUNCTION PANEL
ALT: 95 IU/L — ABNORMAL HIGH (ref 0–44)
AST: 78 IU/L — ABNORMAL HIGH (ref 0–40)
Albumin: 4.2 g/dL (ref 3.8–4.8)
Alkaline Phosphatase: 66 IU/L (ref 39–117)
Bilirubin Total: 0.4 mg/dL (ref 0.0–1.2)
Bilirubin, Direct: 0.15 mg/dL (ref 0.00–0.40)
Total Protein: 6.4 g/dL (ref 6.0–8.5)

## 2019-05-23 LAB — BASIC METABOLIC PANEL
BUN/Creatinine Ratio: 16 (ref 10–24)
BUN: 18 mg/dL (ref 8–27)
CO2: 28 mmol/L (ref 20–29)
Calcium: 9.3 mg/dL (ref 8.6–10.2)
Chloride: 97 mmol/L (ref 96–106)
Creatinine, Ser: 1.15 mg/dL (ref 0.76–1.27)
GFR calc Af Amer: 77 mL/min/{1.73_m2} (ref >59)
GFR calc non Af Amer: 67 mL/min/{1.73_m2} (ref >59)
Glucose: 169 mg/dL — ABNORMAL HIGH (ref 65–99)
Potassium: 4.7 mmol/L (ref 3.5–5.2)
Sodium: 138 mmol/L (ref 134–144)

## 2019-05-23 LAB — LIPID PANEL
Chol/HDL Ratio: 3.6 ratio (ref 0.0–5.0)
Cholesterol, Total: 133 mg/dL (ref 100–199)
HDL: 37 mg/dL — ABNORMAL LOW (ref >39)
LDL Chol Calc (NIH): 67 mg/dL (ref 0–99)
Triglycerides: 170 mg/dL — ABNORMAL HIGH (ref 0–149)
VLDL Cholesterol Cal: 29 mg/dL (ref 5–40)

## 2019-05-23 LAB — MICROALBUMIN / CREATININE URINE RATIO
Creatinine, Urine: 79.6 mg/dL
Microalb/Creat Ratio: 35 mg/g creat — ABNORMAL HIGH (ref 0–29)
Microalbumin, Urine: 27.5 ug/mL

## 2019-05-23 LAB — HEMOGLOBIN A1C
Est. average glucose Bld gHb Est-mCnc: 200 mg/dL
Hgb A1c MFr Bld: 8.6 % — ABNORMAL HIGH (ref 4.8–5.6)

## 2019-05-23 LAB — PSA: Prostate Specific Ag, Serum: 0.5 ng/mL (ref 0.0–4.0)

## 2019-05-29 ENCOUNTER — Ambulatory Visit: Payer: Medicaid Other | Admitting: Family Medicine

## 2019-05-29 ENCOUNTER — Other Ambulatory Visit: Payer: Self-pay

## 2019-05-29 VITALS — BP 132/80 | Temp 97.8°F | Ht 69.0 in | Wt 239.0 lb

## 2019-05-29 DIAGNOSIS — I10 Essential (primary) hypertension: Secondary | ICD-10-CM

## 2019-05-29 DIAGNOSIS — R6889 Other general symptoms and signs: Secondary | ICD-10-CM | POA: Diagnosis not present

## 2019-05-29 DIAGNOSIS — E1140 Type 2 diabetes mellitus with diabetic neuropathy, unspecified: Secondary | ICD-10-CM

## 2019-05-29 DIAGNOSIS — Z23 Encounter for immunization: Secondary | ICD-10-CM | POA: Diagnosis not present

## 2019-05-29 DIAGNOSIS — R4182 Altered mental status, unspecified: Secondary | ICD-10-CM

## 2019-05-29 DIAGNOSIS — E7849 Other hyperlipidemia: Secondary | ICD-10-CM | POA: Diagnosis not present

## 2019-05-29 MED ORDER — SITAGLIPTIN PHOSPHATE 50 MG PO TABS: 50.0000 mg | ORAL_TABLET | Freq: Every day | ORAL | 5 refills | Status: DC
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
## 2019-06-08 ENCOUNTER — Other Ambulatory Visit: Payer: Self-pay

## 2019-06-08 DIAGNOSIS — Z20822 Contact with and (suspected) exposure to covid-19: Secondary | ICD-10-CM

## 2019-06-09 LAB — NOVEL CORONAVIRUS, NAA: SARS-CoV-2, NAA: NOT DETECTED

## 2019-06-11 ENCOUNTER — Other Ambulatory Visit: Payer: Self-pay | Admitting: *Deleted

## 2019-06-11 ENCOUNTER — Telehealth: Payer: Self-pay | Admitting: Family Medicine

## 2019-06-11 NOTE — Telephone Encounter (Signed)
Prescription for shoes at nurse station to sign if you agree.

## 2019-06-11 NOTE — Telephone Encounter (Signed)
Pt had OV with Dr. Nicki Reaper 05/29/2019 & we were going to send an order to [facility] for diabetic shoes  (pt asked me to check on this while I was giving appointment information for his MRI)     Please advise & call pt

## 2019-06-11 NOTE — Telephone Encounter (Signed)
Script and office visit not faxed over and pt notified.

## 2019-06-11 NOTE — Telephone Encounter (Signed)
Signed it thanks

## 2019-06-13 ENCOUNTER — Telehealth: Payer: Self-pay | Admitting: Family Medicine

## 2019-06-13 NOTE — Telephone Encounter (Signed)
Patient may have a prescription for a seated walker diagnosis osteoarthritis, ataxia

## 2019-06-13 NOTE — Telephone Encounter (Signed)
Pt would like Dr. Nicki Reaper to write an order for a seated walker and send it to [place], [place]

## 2019-06-13 NOTE — Telephone Encounter (Signed)
Script wrote, signed and faxed. Pt notified.

## 2019-06-14 ENCOUNTER — Ambulatory Visit (HOSPITAL_COMMUNITY)
Admission: RE | Admit: 2019-06-14 | Discharge: 2019-06-14 | Disposition: A | Discharge status: Home / Self Care | Payer: Medicaid Other | Source: Ambulatory Visit | Attending: Family Medicine | Admitting: Family Medicine

## 2019-06-14 ENCOUNTER — Other Ambulatory Visit: Payer: Self-pay

## 2019-06-14 DIAGNOSIS — R6889 Other general symptoms and signs: Secondary | ICD-10-CM | POA: Diagnosis not present

## 2019-06-14 DIAGNOSIS — R4182 Altered mental status, unspecified: Secondary | ICD-10-CM | POA: Insufficient documentation

## 2019-06-18 ENCOUNTER — Telehealth: Payer: Self-pay | Admitting: Family Medicine

## 2019-06-18 NOTE — Telephone Encounter (Signed)
Pt needs a prior authorization for medicaid to cover the walker.

## 2019-06-18 NOTE — Telephone Encounter (Signed)
Please explain this to the family They can make their own choice from a financial standpoint

## 2019-06-18 NOTE — Telephone Encounter (Signed)
Spoke with durable medical equipment at [facility]. Medicaid  willl not pay for a seated rolling walker. (they state no insurance does) This is a cash purchase only and costs $74.  Medicaid will only cover the standard folding walker with no seat.

## 2019-06-18 NOTE — Telephone Encounter (Signed)
Patient notified and stated he would call around tomorrow to see if he can find a cheaper seated walker.

## 2019-06-19 ENCOUNTER — Telehealth: Payer: Self-pay | Admitting: Family Medicine

## 2019-06-19 NOTE — Telephone Encounter (Signed)
Office notes must include medical necessity for a rolling walker with a seat

## 2019-06-19 NOTE — Telephone Encounter (Signed)
Pt would like script for sitting walker and office notes faxed to [facility].   Fax # [phone]

## 2019-06-26 ENCOUNTER — Other Ambulatory Visit: Payer: Self-pay

## 2019-06-26 ENCOUNTER — Ambulatory Visit (INDEPENDENT_AMBULATORY_CARE_PROVIDER_SITE_OTHER): Payer: Medicaid Other | Admitting: Family Medicine

## 2019-06-26 DIAGNOSIS — E1140 Type 2 diabetes mellitus with diabetic neuropathy, unspecified: Secondary | ICD-10-CM | POA: Diagnosis not present

## 2019-06-26 NOTE — Progress Notes (Addendum)
Subjective:    Patient ID: Jim [name], male    DOB: September 17, 1955, 63 y.o.   MRN: [id number]  HPI  Patient calls for a follow up on MRI. Patient also needs face to face visit for medical need of diabetic shoes. Patient states overall things are going pretty good. This patient was seen face-to-face on last visit for diabetic shoes.  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  We will send those paperwork over to the pharmacy so he can get his diabetic shoes  Patient also has severe arthritis and is very difficult for him to walk any significant length without having a positive rest and because of that he needs to have a walker with a seat on it we will also help with the documentation for that.  The patient's mental status is actually improving and his wife is helping him manage his medicines he is tapered down on the Neurontin and is going to taper off of it he has noticed an improvement in his overall function and thinking Virtual Visit via Video Note  I connected with [name] on 06/26/19 at  1:10 PM EST by a video enabled telemedicine application and verified that I am speaking with the correct person using two identifiers.  Location: Patient: home Provider: office   I discussed the limitations of evaluation and management by telemedicine and the availability of in person appointments. The patient expressed understanding and agreed to proceed.  History of Present Illness:    Observations/Objective:   Assessment and Plan:   Follow Up Instructions:    I discussed the assessment and treatment plan with the patient. The patient was provided an opportunity to ask questions and all were answered. The patient agreed with the plan and demonstrated an understanding of the instructions.   The patient was advised to call back or seek an in-person evaluation if the symptoms worsen or if the condition fails to improve as anticipated.  I provided 17 minutes of non-face-to-face time  during this encounter.    15 minutes was spent with patient today discussing healthcare issues which they came.  More than 50% of this visit-total duration of visit-was spent in counseling and coordination of care.  Please see diagnosis regarding the focus of this coordination and care     Review of Systems  Constitutional: Negative for activity change.  HENT: Negative for congestion and rhinorrhea.   Respiratory: Negative for cough and shortness of breath.   Cardiovascular: Negative for chest pain.  Gastrointestinal: Negative for abdominal pain, diarrhea, nausea and vomiting.  Genitourinary: Negative for dysuria and hematuria.  Neurological: Negative for weakness and headaches.  Psychiatric/Behavioral: Negative for behavioral problems and confusion.       Objective:   Physical Exam  Patient had virtual visit Appears to be in no distress Atraumatic Neuro able to relate and oriented No apparent resp distress Color normal  Fall Risk  05/29/2019 05/29/2019 06/14/2018  Falls in the past year? 0 0 1  Number falls in past yr: - - 1  Injury with Fall? - - 0  Follow up Falls evaluation completed Falls evaluation completed Education provided        Assessment & Plan:  His mental status is doing better since he is tapered down on the gabapentin plus also his wife is helping him with medications  He will do his lab work in the near future regarding B12  Patient was encouraged to minimize starches and sugars in his foods in use mainly  water flavored water  MRI results was reviewed with the patient  Patient would benefit from diabetic shoes we will go ahead and fill out the paperwork and get it sent Previous office visit 05/29/2019 adequately documents diabetic foot exam  Patient would also benefit from a walker with a seat in it because of his arthritis condition limits his walking and he needs to sit frequently so therefore using a walker with a seat would help him with going  about moving within a grocery store or other places  Family try to do the best can of being safe against Covid  To follow-up in springtime  Due to severe arthritis will need rolling walker with seat to allow him to do his ADLs.  Face-to-face evaluation completed

## 2019-07-01 NOTE — Telephone Encounter (Signed)
Nurses please go ahead and write out a prescription I will sign it The office notes does have this documentation as requested

## 2019-07-02 NOTE — Telephone Encounter (Signed)
Script written out and awaiting signature.

## 2019-07-02 NOTE — Telephone Encounter (Signed)
Pt notified. And he states he never got his diabetic shoes from [facility]. I called to see what they need because I know this has been sent over a few weeks ago and they said they need a 2 page form filled out and then they will have what they need. Stated they sent on the 16th but I asked to resend it so we can get the pt his shoes.

## 2019-07-03 NOTE — Telephone Encounter (Signed)
rx for rolling walker was faxed yesterday and form for diabetic shoes faxed today and pt was notified.

## 2019-09-16 ENCOUNTER — Other Ambulatory Visit: Payer: Self-pay | Admitting: Family Medicine

## 2019-09-17 NOTE — Telephone Encounter (Signed)
May have 90-day on each needs follow-up visit by late spring

## 2019-09-26 ENCOUNTER — Other Ambulatory Visit: Payer: Self-pay | Admitting: Vascular Surgery

## 2019-09-26 ENCOUNTER — Other Ambulatory Visit: Payer: Self-pay | Admitting: Family Medicine

## 2019-10-30 ENCOUNTER — Other Ambulatory Visit: Payer: Self-pay | Admitting: Family Medicine

## 2019-10-30 NOTE — Telephone Encounter (Signed)
Last med check up 06/26/19

## 2019-10-31 NOTE — Telephone Encounter (Signed)
Scheduled 6/7

## 2019-10-31 NOTE — Telephone Encounter (Signed)
May have 90-day needs to do follow-up within the next 3 months

## 2019-10-31 NOTE — Telephone Encounter (Signed)
Please schedule and then route back to nurses 

## 2019-11-14 ENCOUNTER — Other Ambulatory Visit: Payer: Self-pay

## 2019-11-14 ENCOUNTER — Ambulatory Visit (INDEPENDENT_AMBULATORY_CARE_PROVIDER_SITE_OTHER): Payer: Medicare Other | Admitting: Family Medicine

## 2019-11-14 VITALS — BP 126/78 | HR 86 | Temp 97.4°F | Ht 69.0 in | Wt 245.0 lb

## 2019-11-14 DIAGNOSIS — M545 Low back pain: Secondary | ICD-10-CM

## 2019-11-14 DIAGNOSIS — Z8739 Personal history of other diseases of the musculoskeletal system and connective tissue: Secondary | ICD-10-CM | POA: Diagnosis not present

## 2019-11-14 DIAGNOSIS — S39012A Strain of muscle, fascia and tendon of lower back, initial encounter: Secondary | ICD-10-CM

## 2019-11-14 MED ORDER — PREDNISONE 20 MG PO TABS: 40.0000 mg | ORAL_TABLET | Freq: Every day | ORAL | 0 refills | Status: AC

## 2019-11-14 NOTE — Progress Notes (Signed)
   Subjective:    Patient ID: Jim [name], male    DOB: [birth year], 64 y.o.   MRN: [id number]  HPI  Patient arrives with back pain since week end. Patient states his lawnmower got stuck and he tried to lift it up and move it and felt the pain in his back.  Review of Systems     Objective:   Physical Exam        Assessment & Plan:

## 2019-11-14 NOTE — Patient Instructions (Signed)
Acute Back Pain, Adult Acute back pain is sudden and usually short-lived. It is often caused by an injury to the muscles and tissues in the back. The injury may result from:  A muscle or ligament getting overstretched or torn (strained). Ligaments are tissues that connect bones to each other. Lifting something improperly can cause a back strain.  Wear and tear (degeneration) of the spinal disks. Spinal disks are circular tissue that provides cushioning between the bones of the spine (vertebrae).  Twisting motions, such as while playing sports or doing yard work.  A hit to the back.  Arthritis. You may have a physical exam, lab tests, and imaging tests to find the cause of your pain. Acute back pain usually goes away with rest and home care. Follow these instructions at home: Managing pain, stiffness, and swelling  Take over-the-counter and prescription medicines only as told by your health care provider.  Your health care provider may recommend applying ice during the first 24-48 hours after your pain starts. To do this: ? Put ice in a plastic bag. ? Place a towel between your skin and the bag. ? Leave the ice on for 20 minutes, 2-3 times a day.  If directed, apply heat to the affected area as often as told by your health care provider. Use the heat source that your health care provider recommends, such as a moist heat pack or a heating pad. ? Place a towel between your skin and the heat source. ? Leave the heat on for 20-30 minutes. ? Remove the heat if your skin turns bright red. This is especially important if you are unable to feel pain, heat, or cold. You have a greater risk of getting burned. Activity   Do not stay in bed. Staying in bed for more than 1-2 days can delay your recovery.  Sit up and stand up straight. Avoid leaning forward when you sit, or hunching over when you stand. ? If you work at a desk, sit close to it so you do not need to lean over. Keep your chin tucked  in. Keep your neck drawn back, and keep your elbows bent at a right angle. Your arms should look like the letter "L." ? Sit high and close to the steering wheel when you drive. Add lower back (lumbar) support to your car seat, if needed.  Take short walks on even surfaces as soon as you are able. Try to increase the length of time you walk each day.  Do not sit, drive, or stand in one place for more than 30 minutes at a time. Sitting or standing for long periods of time can put stress on your back.  Do not drive or use heavy machinery while taking prescription pain medicine.  Use proper lifting techniques. When you bend and lift, use positions that put less stress on your back: ? Bend your knees. ? Keep the load close to your body. ? Avoid twisting.  Exercise regularly as told by your health care provider. Exercising helps your back heal faster and helps prevent back injuries by keeping muscles strong and flexible.  Work with a physical therapist to make a safe exercise program, as recommended by your health care provider. Do any exercises as told by your physical therapist. Lifestyle  Maintain a healthy weight. Extra weight puts stress on your back and makes it difficult to have good posture.  Avoid activities or situations that make you feel anxious or stressed. Stress and anxiety increase muscle   tension and can make back pain worse. Learn ways to manage anxiety and stress, such as through exercise. General instructions  Sleep on a firm mattress in a comfortable position. Try lying on your side with your knees slightly bent. If you lie on your back, put a pillow under your knees.  Follow your treatment plan as told by your health care provider. This may include: ? Cognitive or behavioral therapy. ? Acupuncture or massage therapy. ? Meditation or yoga. Contact a health care provider if:  You have pain that is not relieved with rest or medicine.  You have increasing pain going down  into your legs or buttocks.  Your pain does not improve after 2 weeks.  You have pain at night.  You lose weight without trying.  You have a fever or chills. Get help right away if:  You develop new bowel or bladder control problems.  You have unusual weakness or numbness in your arms or legs.  You develop nausea or vomiting.  You develop abdominal pain.  You feel faint. Summary  Acute back pain is sudden and usually short-lived.  Use proper lifting techniques. When you bend and lift, use positions that put less stress on your back.  Take over-the-counter and prescription medicines and apply heat or ice as directed by your health care provider. This information is not intended to replace advice given to you by your health care provider. Make sure you discuss any questions you have with your health care provider. Document Revised: 11/07/2018 Document Reviewed: 03/02/2017 Elsevier Patient Education  2020 Elsevier Inc.  

## 2019-11-14 NOTE — Progress Notes (Signed)
Patient ID: Jim [name], male    DOB: 31-Jul-1956, 64 y.o.   MRN: [id number]   Chief Complaint  Patient presents with  . Back Pain   Injury occurred 4 days ago with moving lawn mower to side.   Felt a "pop" in lower back. Taking 20mg  oxycodone, 6x per day for chronic pain. Has history of chronic pain in feet/knees, back, elbows.  No h/o surgery of back in past.  No bowel or bladder incontinence. No radiation of pain down the legs. Feeling like "bolts of lightening going up the back." Tried gabapentin in past, feeling more fatigued when taking it. Has been using some icy hot.   Vitals BP 126/78   Pulse 86   Temp (!) 97.4 F (36.3 C) (Oral)   Ht 5\' 9"  (1.753 m)   Wt 245 lb (111.1 kg)   SpO2 95%   BMI 36.18 kg/m   Medical History [place] has a past medical history of Arthritis, Back pain, Bursitis of hip, Depression, DJD (degenerative joint disease) of knee, Ex-smoker, Gastric ulcer, History of colon polyps, Hyperlipidemia, Primary localized osteoarthritis of right knee, and PVD (peripheral vascular disease) with claudication ([place]) (05/21/2018).   Outpatient Encounter Medications as of 11/14/2019  Medication Sig  . albuterol (PROVENTIL HFA;VENTOLIN HFA) 108 (90 Base) MCG/ACT inhaler Inhale 2 puffs every 6 (six) hours as needed into the lungs for wheezing. (Patient taking differently: Inhale 2 puffs into the lungs every 6 (six) hours as needed for wheezing or shortness of breath. )  . aspirin EC 81 MG tablet Take 81 mg by mouth daily.  . Cholecalciferol (VITAMIN D3 PO) Take 1 capsule by mouth daily.  . clopidogrel (PLAVIX) 75 MG tablet TAKE 1 TABLET BY MOUTH EVERY DAY  . ezetimibe (ZETIA) 10 MG tablet TAKE 1 TABLET BY MOUTH EVERY DAY  . FLUoxetine (PROZAC) 20 MG capsule TAKE 3 CAPSULES BY MOUTH EVERY DAY  . gabapentin (NEURONTIN) 300 MG capsule Take 300 mg by mouth every evening.  Marland Kitchen lisinopril (ZESTRIL) 5 MG tablet TAKE 1 TABLET BY MOUTH EVERY DAY  . metFORMIN (GLUCOPHAGE) 500  MG tablet 2 qam and 1q supper  . OVER THE COUNTER MEDICATION One a day vitamin  . Oxycodone HCl 20 MG TABS TK 1/2-1 T 3-5 TIMES DAILY IF TOLERATED  . predniSONE (DELTASONE) 20 MG tablet Take 2 tablets (40 mg total) by mouth daily with breakfast for 5 days.  . sitaGLIPtin (JANUVIA) 50 MG tablet Take 1 tablet (50 mg total) by mouth daily.  . traZODone (DESYREL) 50 MG tablet TAKE 1 TABLET BY MOUTH EVERYDAY AT BEDTIME  . triamcinolone cream (KENALOG) 0.1 % Apply bid prn (Patient taking differently: Apply 1 application topically 2 (two) times daily as needed (for rash). )   No facility-administered encounter medications on file as of 11/14/2019.     Review of Systems  Constitutional: Negative for chills and fever.  HENT: Negative for congestion, rhinorrhea and sore throat.   Respiratory: Negative for cough, shortness of breath and wheezing.   Cardiovascular: Negative for chest pain and leg swelling.  Gastrointestinal: Negative for abdominal pain, diarrhea, nausea and vomiting.  Genitourinary: Negative for dysuria and frequency.  Musculoskeletal: Positive for back pain (acute on chronic).  Skin: Negative for rash.  Neurological: Negative for dizziness, weakness and headaches.     Objective:   Physical Exam Constitutional:      General: He is not in acute distress.    Appearance: Normal appearance. He is not ill-appearing.  Pulmonary:  Effort: Pulmonary effort is normal.     Breath sounds: Normal breath sounds.  Musculoskeletal:        General: Tenderness present.     Thoracic back: Normal. No swelling, tenderness or bony tenderness.     Lumbar back: Tenderness present. Lacerations: ttp over lumbar paraspinal bilaterally and over lumbar spinous processes.     Comments: Pt unable to flex forward, pain with sb bilaterally. Walking with a cane.  Skin:    General: Skin is warm and dry.     Findings: No rash.  Neurological:     General: No focal deficit present.     Mental Status:  He is alert and oriented to person, place, and time.     Cranial Nerves: No cranial nerve deficit.     Sensory: No sensory deficit.     Motor: No weakness.     Gait: Gait normal.      Assessment and Plan   1. Lumbar strain, initial encounter - predniSONE (DELTASONE) 20 MG tablet; Take 2 tablets (40 mg total) by mouth daily with breakfast for 5 days.  Dispense: 10 tablet; Refill: 0  2. History of chronic back pain    Use heat 3x per day alternating with ice.  otc lidocaine patch or muscle rub is okay.  Add prednisone with food as directed.  rto if worsening pain or not improving in next 7-10 days. Pt in agreement.   F/u prn.  [place], DO 11/14/2019

## 2019-11-20 DIAGNOSIS — M5136 Other intervertebral disc degeneration, lumbar region: Secondary | ICD-10-CM | POA: Diagnosis not present

## 2019-11-20 DIAGNOSIS — G894 Chronic pain syndrome: Secondary | ICD-10-CM | POA: Diagnosis not present

## 2019-11-20 DIAGNOSIS — M545 Low back pain: Secondary | ICD-10-CM | POA: Diagnosis not present

## 2019-11-20 DIAGNOSIS — M1990 Unspecified osteoarthritis, unspecified site: Secondary | ICD-10-CM | POA: Diagnosis not present

## 2019-11-23 ENCOUNTER — Other Ambulatory Visit: Payer: Self-pay | Admitting: Family Medicine

## 2019-11-28 ENCOUNTER — Other Ambulatory Visit: Payer: Self-pay | Admitting: Family Medicine

## 2019-11-28 DIAGNOSIS — R6889 Other general symptoms and signs: Secondary | ICD-10-CM | POA: Diagnosis not present

## 2019-11-29 LAB — FOLATE: Folate: 13.3 ng/mL (ref >3.0)

## 2019-11-29 LAB — TSH: TSH: 2.04 u[IU]/mL (ref 0.450–4.500)

## 2019-11-29 LAB — VITAMIN B12: Vitamin B-12: 1096 pg/mL (ref 232–1245)

## 2019-12-08 ENCOUNTER — Other Ambulatory Visit: Payer: Self-pay | Admitting: Family Medicine

## 2019-12-10 NOTE — Telephone Encounter (Signed)
Last seen 06/25/20 for med check

## 2019-12-12 ENCOUNTER — Other Ambulatory Visit: Payer: Self-pay | Admitting: Family Medicine

## 2019-12-18 DIAGNOSIS — Z79891 Long term (current) use of opiate analgesic: Secondary | ICD-10-CM | POA: Diagnosis not present

## 2019-12-18 DIAGNOSIS — G894 Chronic pain syndrome: Secondary | ICD-10-CM | POA: Diagnosis not present

## 2019-12-18 DIAGNOSIS — M5136 Other intervertebral disc degeneration, lumbar region: Secondary | ICD-10-CM | POA: Diagnosis not present

## 2019-12-24 ENCOUNTER — Telehealth: Payer: Self-pay | Admitting: Family Medicine

## 2019-12-24 ENCOUNTER — Other Ambulatory Visit: Payer: Self-pay | Admitting: Family Medicine

## 2019-12-24 NOTE — Telephone Encounter (Signed)
Ezetimibe (ZETIA) 10 MG tablet.  Patient said pharmacy called and said request for refill was denied Dr would not order again and Jim [name] is wanting to know why. Contact number he can be reached [phone]

## 2019-12-24 NOTE — Telephone Encounter (Signed)
Called pharm to see why they told him it was denied. It was sent over on 12/12/19 to [facility] and I have the confirmation that they received it that day for 90 day supply. Pharm states they are not sure why he was told it was denied because he picked up a 90 day supply of zetia on 12/14/19. Left message to return call to discuss with pt

## 2019-12-25 NOTE — Telephone Encounter (Signed)
Discussed with pt. Pt states he must of picked it up then and he will check to see if he has it when he gets home and call back if he doesn't

## 2019-12-26 ENCOUNTER — Other Ambulatory Visit: Payer: Self-pay | Admitting: Family Medicine

## 2020-01-07 ENCOUNTER — Other Ambulatory Visit: Payer: Self-pay

## 2020-01-07 ENCOUNTER — Ambulatory Visit (INDEPENDENT_AMBULATORY_CARE_PROVIDER_SITE_OTHER): Payer: Medicare Other | Admitting: Family Medicine

## 2020-01-07 ENCOUNTER — Encounter: Payer: Self-pay | Admitting: Family Medicine

## 2020-01-07 VITALS — BP 120/78 | Temp 98.1°F | Wt 235.2 lb

## 2020-01-07 DIAGNOSIS — Z79899 Other long term (current) drug therapy: Secondary | ICD-10-CM

## 2020-01-07 DIAGNOSIS — E1169 Type 2 diabetes mellitus with other specified complication: Secondary | ICD-10-CM

## 2020-01-07 DIAGNOSIS — Z23 Encounter for immunization: Secondary | ICD-10-CM

## 2020-01-07 DIAGNOSIS — E1165 Type 2 diabetes mellitus with hyperglycemia: Secondary | ICD-10-CM

## 2020-01-07 DIAGNOSIS — R5383 Other fatigue: Secondary | ICD-10-CM

## 2020-01-07 DIAGNOSIS — E7849 Other hyperlipidemia: Secondary | ICD-10-CM

## 2020-01-07 DIAGNOSIS — E1140 Type 2 diabetes mellitus with diabetic neuropathy, unspecified: Secondary | ICD-10-CM | POA: Diagnosis not present

## 2020-01-07 DIAGNOSIS — E785 Hyperlipidemia, unspecified: Secondary | ICD-10-CM

## 2020-01-07 LAB — POCT GLYCOSYLATED HEMOGLOBIN (HGB A1C): Hemoglobin A1C: 9.5 % — AB (ref 4.0–5.6)

## 2020-01-07 MED ORDER — TRAZODONE HCL 50 MG PO TABS: ORAL_TABLET | ORAL | 5 refills | Status: DC

## 2020-01-07 MED ORDER — CLOPIDOGREL BISULFATE 75 MG PO TABS: 75.0000 mg | ORAL_TABLET | Freq: Every day | ORAL | 5 refills | Status: DC

## 2020-01-07 MED ORDER — LISINOPRIL 5 MG PO TABS: 5.0000 mg | ORAL_TABLET | Freq: Every day | ORAL | 1 refills | Status: DC

## 2020-01-07 MED ORDER — FLUOXETINE HCL 20 MG PO CAPS: 60.0000 mg | ORAL_CAPSULE | Freq: Every day | ORAL | 1 refills | Status: DC

## 2020-01-07 MED ORDER — SITAGLIPTIN PHOSPHATE 50 MG PO TABS: 50.0000 mg | ORAL_TABLET | Freq: Every day | ORAL | 5 refills | Status: DC

## 2020-01-07 MED ORDER — METFORMIN HCL 500 MG PO TABS: ORAL_TABLET | ORAL | 5 refills | Status: DC

## 2020-01-07 MED ORDER — EZETIMIBE 10 MG PO TABS: 10.0000 mg | ORAL_TABLET | Freq: Every day | ORAL | 1 refills | Status: DC

## 2020-01-07 NOTE — Patient Instructions (Signed)

## 2020-01-07 NOTE — Progress Notes (Signed)
Subjective:    Patient ID: Jim [name], male    DOB: Apr 11, 1956, 64 y.o.   MRN: [id number]  Diabetes He presents for his follow-up diabetic visit. He has type 2 diabetes mellitus. Pertinent negatives for hypoglycemia include no confusion, dizziness or headaches. There are no diabetic associated symptoms. Pertinent negatives for diabetes include no chest pain and no fatigue. There are no diabetic complications. He does not see a podiatrist.Eye exam current: states he see's an eye doc every two years.  Arthritis Presents for follow-up visit. Pertinent negatives include no diarrhea, fatigue or rash.   Results for orders placed or performed in visit on 01/07/20  POCT glycosylated hemoglobin (Hb A1C)  Result Value Ref Range   Hemoglobin A1C 9.5 (A) 4.0 - 5.6 %   HbA1c POC (<> result, manual entry)     HbA1c, POC (prediabetic range)     HbA1c, POC (controlled diabetic range)      Fall Risk  01/07/2020 05/29/2019 05/29/2019 06/14/2018  Falls in the past year? 1 0 0 1  Number falls in past yr: 1 - - 1  Injury with Fall? 0 - - 0  Follow up Falls evaluation completed Falls evaluation completed Falls evaluation completed Education provided     Review of Systems  Constitutional: Negative for diaphoresis and fatigue.  HENT: Negative for congestion and rhinorrhea.   Respiratory: Negative for cough and shortness of breath.   Cardiovascular: Negative for chest pain and leg swelling.  Gastrointestinal: Negative for abdominal pain and diarrhea.  Musculoskeletal: Positive for arthralgias, arthritis and back pain.  Skin: Negative for color change and rash.  Neurological: Negative for dizziness and headaches.  Psychiatric/Behavioral: Negative for behavioral problems and confusion.       Objective:   Physical Exam Vitals reviewed.  Constitutional:      General: He is not in acute distress. HENT:     Head: Normocephalic and atraumatic.  Eyes:     General:        Right eye: No discharge.         Left eye: No discharge.  Neck:     Trachea: No tracheal deviation.  Cardiovascular:     Rate and Rhythm: Normal rate and regular rhythm.     Heart sounds: Normal heart sounds. No murmur.  Pulmonary:     Effort: Pulmonary effort is normal. No respiratory distress.     Breath sounds: Normal breath sounds.  Lymphadenopathy:     Cervical: No cervical adenopathy.  Skin:    General: Skin is warm and dry.  Neurological:     Mental Status: He is alert.     Coordination: Coordination normal.  Psychiatric:        Behavior: Behavior normal.           Assessment & Plan:  1. Type 2 diabetes mellitus with diabetic neuropathy, without long-term current use of insulin (HCC) Diabetes not under good control.  Very important for patient to check his sugars more often and send [place] some readings also patient encouraged to do a better job with diet he is only taking 1 Metformin twice daily we will bump it up to 2 Metformin's twice daily recheck him again in 3 months time to relook at A1c I did recommend medication similar to Vick toes that he defers on this currently - POCT glycosylated hemoglobin (Hb K7Q) - Basic metabolic panel  2. Other fatigue Fatigue and tiredness we will check CBC I think is more related to his arthritis  prevents him from exercising which feeds into the fatigue and tiredness - CBC with Differential/Platelet  3. Other hyperlipidemia Patient unable to tolerate statins check lipid continue Zetia - Lipid panel  4. High risk medication use Look at liver functions - Hepatic function panel  5. Need for vaccination Pneumonia vaccine they will - Pneumococcal conjugate vaccine 13-valent  6. Hyperlipidemia associated with type 2 diabetes mellitus ([place]) Watch diet closely stay more active try to lose weight  7. 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

## 2020-01-15 DIAGNOSIS — M545 Low back pain: Secondary | ICD-10-CM | POA: Diagnosis not present

## 2020-01-15 DIAGNOSIS — M5136 Other intervertebral disc degeneration, lumbar region: Secondary | ICD-10-CM | POA: Diagnosis not present

## 2020-01-15 DIAGNOSIS — G894 Chronic pain syndrome: Secondary | ICD-10-CM | POA: Diagnosis not present

## 2020-01-16 DIAGNOSIS — G894 Chronic pain syndrome: Secondary | ICD-10-CM | POA: Diagnosis not present

## 2020-02-07 DIAGNOSIS — D225 Melanocytic nevi of trunk: Secondary | ICD-10-CM | POA: Diagnosis not present

## 2020-02-07 DIAGNOSIS — B078 Other viral warts: Secondary | ICD-10-CM | POA: Diagnosis not present

## 2020-02-07 DIAGNOSIS — L821 Other seborrheic keratosis: Secondary | ICD-10-CM | POA: Diagnosis not present

## 2020-02-12 ENCOUNTER — Telehealth: Payer: Self-pay | Admitting: Family Medicine

## 2020-02-12 DIAGNOSIS — G894 Chronic pain syndrome: Secondary | ICD-10-CM | POA: Diagnosis not present

## 2020-02-12 DIAGNOSIS — Z79891 Long term (current) use of opiate analgesic: Secondary | ICD-10-CM | POA: Diagnosis not present

## 2020-02-12 DIAGNOSIS — M5136 Other intervertebral disc degeneration, lumbar region: Secondary | ICD-10-CM | POA: Diagnosis not present

## 2020-02-12 DIAGNOSIS — M1990 Unspecified osteoarthritis, unspecified site: Secondary | ICD-10-CM | POA: Diagnosis not present

## 2020-02-12 NOTE — Telephone Encounter (Signed)
I spoke with the patient's pain medication doctor.  They were wanting to put him on a steroid Dosepak and wanted to make sure it was okay with his diabetes.  In my opinion this is okay with his diabetes but best to do over a short span of time such as 7 days or less also check glucose readings periodically notify us if any sugars in the upper 200s or more-Dr. Primus Bravo voiced understanding

## 2020-02-12 NOTE — Telephone Encounter (Signed)
Telephone message Dr. Primus Bravo Medrol dose pack

## 2020-03-06 ENCOUNTER — Telehealth: Payer: Self-pay | Admitting: Family Medicine

## 2020-03-06 NOTE — Telephone Encounter (Signed)
Made car visit appointment for tomorrow at 4:10

## 2020-03-06 NOTE — Telephone Encounter (Signed)
Pt states 2 or 3 days ago his ears started popping, having congestion and puffiness under eyes. He would [facility] [place].   Pt states the only two numbers from our office that will ring to his phone is the [phone] (line 1) and the [phone] (not sure which line this is)

## 2020-03-06 NOTE — Telephone Encounter (Signed)
Please call and schedule appt for tomorrow. Same day is fine.

## 2020-03-07 ENCOUNTER — Ambulatory Visit (INDEPENDENT_AMBULATORY_CARE_PROVIDER_SITE_OTHER): Payer: Medicare Other | Admitting: Family Medicine

## 2020-03-07 ENCOUNTER — Other Ambulatory Visit: Payer: Self-pay

## 2020-03-07 ENCOUNTER — Ambulatory Visit: Payer: Medicare Other | Admitting: Family Medicine

## 2020-03-07 DIAGNOSIS — R0981 Nasal congestion: Secondary | ICD-10-CM

## 2020-03-07 MED ORDER — DOXYCYCLINE HYCLATE 100 MG PO TABS
100.0000 mg | ORAL_TABLET | Freq: Two times a day (BID) | ORAL | 0 refills | Status: DC
Start: 2020-03-07 — End: 2020-04-30

## 2020-03-07 NOTE — Progress Notes (Signed)
   Subjective:    Patient ID: Jim [name], male    DOB: 22-Feb-1956, 64 y.o.   MRN: [id number]  Sinus Problem This is a new problem. The current episode started in the past 7 days. Associated symptoms include congestion and sinus pressure. (Ears popping)   Significant head congestion drainage coughing denies high fever chills wheezing difficulty breathing O2 saturation 95%  Review of Systems  HENT: Positive for congestion and sinus pressure.   See above     Objective:   Physical Exam  Eardrums normal nares normal sinus mild tenderness lungs clear heart regular HEENT benign      Assessment & Plan:  Sinusitis Doxycycline 10 days Warning signs discussed Covid test taken Follow-up if problems

## 2020-03-09 LAB — SPECIMEN STATUS REPORT

## 2020-03-09 LAB — NOVEL CORONAVIRUS, NAA: SARS-CoV-2, NAA: NOT DETECTED

## 2020-03-09 LAB — SARS-COV-2, NAA 2 DAY TAT

## 2020-03-11 DIAGNOSIS — G894 Chronic pain syndrome: Secondary | ICD-10-CM | POA: Diagnosis not present

## 2020-03-17 DIAGNOSIS — G894 Chronic pain syndrome: Secondary | ICD-10-CM | POA: Diagnosis not present

## 2020-03-31 ENCOUNTER — Telehealth: Payer: Self-pay | Admitting: Family Medicine

## 2020-03-31 NOTE — Telephone Encounter (Signed)
Pt would like MRI for back pain

## 2020-03-31 NOTE — Telephone Encounter (Signed)
Patient will need office visit with Dr Nicki Reaper to discuss. Left message to return call

## 2020-04-01 NOTE — Telephone Encounter (Signed)
Left  Message to make appointment.

## 2020-04-08 ENCOUNTER — Ambulatory Visit (INDEPENDENT_AMBULATORY_CARE_PROVIDER_SITE_OTHER): Payer: Medicare Other | Admitting: Family Medicine

## 2020-04-08 ENCOUNTER — Encounter: Payer: Self-pay | Admitting: Family Medicine

## 2020-04-08 ENCOUNTER — Other Ambulatory Visit: Payer: Self-pay

## 2020-04-08 VITALS — BP 124/82 | Temp 97.9°F | Ht 69.0 in | Wt 230.6 lb

## 2020-04-08 DIAGNOSIS — E1140 Type 2 diabetes mellitus with diabetic neuropathy, unspecified: Secondary | ICD-10-CM

## 2020-04-08 DIAGNOSIS — I10 Essential (primary) hypertension: Secondary | ICD-10-CM

## 2020-04-08 DIAGNOSIS — E7849 Other hyperlipidemia: Secondary | ICD-10-CM | POA: Diagnosis not present

## 2020-04-08 DIAGNOSIS — M5441 Lumbago with sciatica, right side: Secondary | ICD-10-CM

## 2020-04-08 DIAGNOSIS — Z125 Encounter for screening for malignant neoplasm of prostate: Secondary | ICD-10-CM

## 2020-04-08 DIAGNOSIS — G8929 Other chronic pain: Secondary | ICD-10-CM

## 2020-04-08 MED ORDER — ROSUVASTATIN CALCIUM 5 MG PO TABS
ORAL_TABLET | ORAL | 5 refills | Status: DC
Start: 2020-04-08 — End: 2020-04-14

## 2020-04-08 NOTE — Progress Notes (Signed)
   Subjective:    Patient ID: Jim [name], male    DOB: 1956/07/07, 64 y.o.   MRN: [id number]  HPI  Patient arrives to discuss back pain. Patient states his pain management doctor recommends a MRI. Since July getting worse Keeps him up Down the legs on the right ,more Pain at a 8 level Pain meds not helping much Was keeping the pain at a 5 but no longer  No known injury No stretches  Review of Systems  Constitutional: Negative for activity change.  HENT: Negative for congestion and rhinorrhea.   Respiratory: Negative for cough and shortness of breath.   Cardiovascular: Negative for chest pain.  Gastrointestinal: Negative for abdominal pain, diarrhea, nausea and vomiting.  Genitourinary: Negative for dysuria and hematuria.  Musculoskeletal: Positive for back pain. Negative for arthralgias, gait problem and joint swelling.  Neurological: Negative for weakness and headaches.  Psychiatric/Behavioral: Negative for behavioral problems and confusion.       Objective:   Physical Exam Vitals reviewed.  Cardiovascular:     Rate and Rhythm: Normal rate and regular rhythm.     Heart sounds: Normal heart sounds. No murmur heard.   Pulmonary:     Effort: Pulmonary effort is normal.     Breath sounds: Normal breath sounds.  Lymphadenopathy:     Cervical: No cervical adenopathy.  Neurological:     Mental Status: He is alert.  Psychiatric:        Behavior: Behavior normal.    Positive straight leg rasie bilateral       Assessment & Plan:  1. Type 2 diabetes mellitus with diabetic neuropathy, without long-term current use of insulin ([place]) Patient will follow up with endocrinology is try to watch diet take his medication check lab work await the results - Hepatic function panel - Hemoglobin A1c - Microalbumin / creatinine urine ratio  2. HTN (hypertension), benign Blood pressure under good control he does take his medicine tries watch salt in the diet - Hepatic function  panel  3. Other hyperlipidemia Patient does agree to trying cholesterol medicine 2 days a week previously he had a bunch of myalgias related to the medication was unable to take - Lipid panel - Hepatic function panel  4. Chronic bilateral low back pain with right-sided sciatica Patient with significant low back pain and discomfort with radiation down the right leg this been going on for several months he will try some stretching exercises we will do some x-rays he has never had an MRI of this area.  His pain medicine management doctor is requesting that we go ahead with MRI.  Follow-up patient in 1 month then at that time make decision regarding MRI - DG Lumbar Spine Complete Patient overall should do his stretches It is concerning his level of back pain and leg pain that is been going on for several months but worse over the past month May well need MRI. No sign weakness today No loss of bowel or bladder control  5. Screening PSA (prostate specific antigen) PSA screen - PSA

## 2020-04-09 DIAGNOSIS — M5136 Other intervertebral disc degeneration, lumbar region: Secondary | ICD-10-CM | POA: Diagnosis not present

## 2020-04-09 DIAGNOSIS — G894 Chronic pain syndrome: Secondary | ICD-10-CM | POA: Diagnosis not present

## 2020-04-09 DIAGNOSIS — M1990 Unspecified osteoarthritis, unspecified site: Secondary | ICD-10-CM | POA: Diagnosis not present

## 2020-04-09 DIAGNOSIS — M545 Low back pain: Secondary | ICD-10-CM | POA: Diagnosis not present

## 2020-04-09 LAB — HEPATIC FUNCTION PANEL
ALT: 98 IU/L — ABNORMAL HIGH (ref 0–44)
AST: 73 IU/L — ABNORMAL HIGH (ref 0–40)
Albumin: 4.2 g/dL (ref 3.8–4.8)
Alkaline Phosphatase: 103 IU/L (ref 48–121)
Bilirubin Total: 0.3 mg/dL (ref 0.0–1.2)
Bilirubin, Direct: 0.12 mg/dL (ref 0.00–0.40)
Total Protein: 6.6 g/dL (ref 6.0–8.5)

## 2020-04-09 LAB — HEMOGLOBIN A1C
Est. average glucose Bld gHb Est-mCnc: 220 mg/dL
Hgb A1c MFr Bld: 9.3 % — ABNORMAL HIGH (ref 4.8–5.6)

## 2020-04-09 LAB — MICROALBUMIN / CREATININE URINE RATIO
Creatinine, Urine: 156.7 mg/dL
Microalb/Creat Ratio: 12 mg/g creat (ref 0–29)
Microalbumin, Urine: 19.3 ug/mL

## 2020-04-09 LAB — PSA: Prostate Specific Ag, Serum: 0.9 ng/mL (ref 0.0–4.0)

## 2020-04-09 LAB — LIPID PANEL
Chol/HDL Ratio: 4.5 ratio (ref 0.0–5.0)
Cholesterol, Total: 149 mg/dL (ref 100–199)
HDL: 33 mg/dL — ABNORMAL LOW (ref >39)
LDL Chol Calc (NIH): 81 mg/dL (ref 0–99)
Triglycerides: 210 mg/dL — ABNORMAL HIGH (ref 0–149)
VLDL Cholesterol Cal: 35 mg/dL (ref 5–40)

## 2020-04-11 ENCOUNTER — Other Ambulatory Visit: Payer: Self-pay | Admitting: Family Medicine

## 2020-04-14 ENCOUNTER — Other Ambulatory Visit: Payer: Self-pay | Admitting: *Deleted

## 2020-04-14 MED ORDER — ROSUVASTATIN CALCIUM 5 MG PO TABS: ORAL_TABLET | ORAL | 5 refills | Status: DC

## 2020-04-16 DIAGNOSIS — G894 Chronic pain syndrome: Secondary | ICD-10-CM | POA: Diagnosis not present

## 2020-04-30 ENCOUNTER — Other Ambulatory Visit: Payer: Self-pay

## 2020-04-30 ENCOUNTER — Encounter: Payer: Self-pay | Admitting: Family Medicine

## 2020-04-30 ENCOUNTER — Ambulatory Visit (INDEPENDENT_AMBULATORY_CARE_PROVIDER_SITE_OTHER): Payer: Medicare Other | Admitting: Family Medicine

## 2020-04-30 VITALS — BP 130/70 | HR 76 | Temp 96.8°F | Wt 236.0 lb

## 2020-04-30 DIAGNOSIS — M48062 Spinal stenosis, lumbar region with neurogenic claudication: Secondary | ICD-10-CM

## 2020-04-30 DIAGNOSIS — E1169 Type 2 diabetes mellitus with other specified complication: Secondary | ICD-10-CM | POA: Diagnosis not present

## 2020-04-30 DIAGNOSIS — K760 Fatty (change of) liver, not elsewhere classified: Secondary | ICD-10-CM

## 2020-04-30 DIAGNOSIS — E785 Hyperlipidemia, unspecified: Secondary | ICD-10-CM

## 2020-04-30 DIAGNOSIS — Z23 Encounter for immunization: Secondary | ICD-10-CM

## 2020-04-30 DIAGNOSIS — E1140 Type 2 diabetes mellitus with diabetic neuropathy, unspecified: Secondary | ICD-10-CM | POA: Diagnosis not present

## 2020-04-30 DIAGNOSIS — I10 Essential (primary) hypertension: Secondary | ICD-10-CM

## 2020-04-30 DIAGNOSIS — M4807 Spinal stenosis, lumbosacral region: Secondary | ICD-10-CM

## 2020-04-30 DIAGNOSIS — R7401 Elevation of levels of liver transaminase levels: Secondary | ICD-10-CM

## 2020-04-30 DIAGNOSIS — H9193 Unspecified hearing loss, bilateral: Secondary | ICD-10-CM

## 2020-04-30 MED ORDER — OZEMPIC (0.25 OR 0.5 MG/DOSE) 2 MG/1.5ML ~~LOC~~ SOPN: PEN_INJECTOR | SUBCUTANEOUS | 3 refills | Status: DC

## 2020-04-30 NOTE — Patient Instructions (Signed)
Results for orders placed or performed in visit on 04/08/20  Lipid panel  Result Value Ref Range   Cholesterol, Total 149 100 - 199 mg/dL   Triglycerides 210 (H) 0 - 149 mg/dL   HDL 33 (L) >39 mg/dL   VLDL Cholesterol Cal 35 5 - 40 mg/dL   LDL Chol Calc (NIH) 81 0 - 99 mg/dL   Chol/HDL Ratio 4.5 0.0 - 5.0 ratio  PSA  Result Value Ref Range   Prostate Specific Ag, Serum 0.9 0.0 - 4.0 ng/mL  Hepatic function panel  Result Value Ref Range   Total Protein 6.6 6.0 - 8.5 g/dL   Albumin 4.2 3.8 - 4.8 g/dL   Bilirubin Total 0.3 0.0 - 1.2 mg/dL   Bilirubin, Direct 0.12 0.00 - 0.40 mg/dL   Alkaline Phosphatase 103 48 - 121 IU/L   AST 73 (H) 0 - 40 IU/L   ALT 98 (H) 0 - 44 IU/L  Hemoglobin A1c  Result Value Ref Range   Hgb A1c MFr Bld 9.3 (H) 4.8 - 5.6 %   Est. average glucose Bld gHb Est-mCnc 220 mg/dL  Microalbumin / creatinine urine ratio  Result Value Ref Range   Creatinine, Urine 156.7 Not Estab. mg/dL   Microalbumin, Urine 19.3 Not Estab. ug/mL   Microalb/Creat Ratio 12 0 - 29 mg/g creat   Your diabetes is not under good control Please work hard at eating healthy We are starting a new medicine called Ozempic It lowers your sugars by several ways.  It is a once a week shot.  It slows down your intestines so therefore you may not feel as hungry as normal.  It can also help you lose weight.  It can also help stimulate your pancreas to work better to lessen your sugars.  Some people experience nausea with it in the first few weeks but that should go away if you should have severe nausea and vomiting stop the medicine and notify us.  In rare cases it can cause inflammation of the pancreas which can cause severe abdominal pain if that should occur please let us know.  You would stop the medicine and let us know.  The vast majority of people tolerate the medicine well.  I would like to see you back in 6 weeks.

## 2020-04-30 NOTE — Progress Notes (Signed)
° °  Subjective:    Patient ID: SOCORRO EBRON, male    DOB: [birth year], 64 y.o.   MRN: [id number]  HPI Patient reports low back pain x several years and recently started getting worse.  He is wanting to have an MRI done.  Patient relates severe pain in the low back he also relates it radiates into the legs.  He states that this gets worse when he does any walking he goes no more than 50 yards and has to stop pain resides that he starts to get the happens again relates the pain in the lower back into the buttocks and into the thighs. HTN (hypertension), benign  Type 2 diabetes mellitus with diabetic neuropathy, without long-term current use of insulin (HCC)  Hyperlipidemia associated with type 2 diabetes mellitus ([place])  Spinal stenosis of lumbar region with neurogenic claudication - Plan: MR Lumbar Spine Wo Contrast  Bilateral hearing loss, unspecified hearing loss type - Plan: Ambulatory referral to Audiology  Elevated transaminase level - Plan: US Abdomen Complete  Fatty liver - Plan: US Abdomen Complete  Spinal stenosis of lumbosacral region  Need for vaccination - Plan: Flu Vaccine QUAD High Dose(Fluad)    Review of Systems  Constitutional: Negative for diaphoresis and fatigue.  HENT: Negative for congestion and rhinorrhea.   Respiratory: Negative for cough and shortness of breath.   Cardiovascular: Negative for chest pain and leg swelling.  Gastrointestinal: Positive for abdominal pain. Negative for diarrhea.  Musculoskeletal: Positive for arthralgias and back pain.  Skin: Negative for color change and rash.  Neurological: Negative for dizziness and headaches.  Psychiatric/Behavioral: Negative for behavioral problems and confusion.       Objective:   Physical Exam  Lungs clear respiratory rate normal heart regular negative straight leg raise subjective discomfort in the low back into the thighs      Assessment & Plan:  1. HTN (hypertension), benign Blood pressure  good control continue current medications watch diet  2. Type 2 diabetes mellitus with diabetic neuropathy, without long-term current use of insulin (HCC) Diabetes subpar control start Ozempic side effects were discussed if severe abdominal pain or does not tolerate notify us immediately otherwise 0.25 weekly for the first 4 weeks then 0.5 follow-up within 3 months  3. Hyperlipidemia associated with type 2 diabetes mellitus ([place]) Continue current cholesterol medicine.  Watch diet stay active  4. Spinal stenosis of lumbar region with neurogenic claudication Significant spinal stenosis with neurogenic claudication symptoms MRI indicated patient has had ongoing back pain was seen in the spring for back pain now is progressed to neurogenic claudication.  MRI indicated - MR Lumbar Spine Wo Contrast  5. Bilateral hearing loss, unspecified hearing loss type Referral to ENT for evaluation audiology - Ambulatory referral to Audiology  6. Elevated transaminase level Elevated liver enzymes with intermittent abdominal pain ultrasound abdomen patient also former smoker this will allow [place] to look at the aorta as well - US Abdomen Complete  7. Fatty liver Fatty liver liver enzymes elevated healthy diet recommended regular activity portion control losing weight recommended - US Abdomen Complete  8. Spinal stenosis of lumbosacral region See above  9. Need for vaccination Today - Flu Vaccine QUAD High Dose(Fluad)

## 2020-05-05 ENCOUNTER — Other Ambulatory Visit: Payer: Self-pay | Admitting: *Deleted

## 2020-05-05 ENCOUNTER — Telehealth: Payer: Self-pay

## 2020-05-05 DIAGNOSIS — E119 Type 2 diabetes mellitus without complications: Secondary | ICD-10-CM

## 2020-05-05 MED ORDER — BLOOD GLUCOSE METER KIT: PACK | 0 refills | Status: DC

## 2020-05-05 NOTE — Telephone Encounter (Signed)
Script printed await signature 

## 2020-05-05 NOTE — Telephone Encounter (Signed)
Pt is requesting new glucose meter and test strips be sent to   [id number] - [place], [place]

## 2020-05-06 ENCOUNTER — Encounter: Payer: Self-pay | Admitting: Family Medicine

## 2020-05-06 DIAGNOSIS — M1990 Unspecified osteoarthritis, unspecified site: Secondary | ICD-10-CM | POA: Diagnosis not present

## 2020-05-06 DIAGNOSIS — Z79891 Long term (current) use of opiate analgesic: Secondary | ICD-10-CM | POA: Diagnosis not present

## 2020-05-06 DIAGNOSIS — G894 Chronic pain syndrome: Secondary | ICD-10-CM | POA: Diagnosis not present

## 2020-05-06 DIAGNOSIS — M5136 Other intervertebral disc degeneration, lumbar region: Secondary | ICD-10-CM | POA: Diagnosis not present

## 2020-05-06 NOTE — Telephone Encounter (Signed)
Faxed and pt notified.

## 2020-05-07 ENCOUNTER — Other Ambulatory Visit: Payer: Self-pay | Admitting: Family Medicine

## 2020-05-07 ENCOUNTER — Ambulatory Visit: Payer: Medicare Other | Admitting: Family Medicine

## 2020-05-10 ENCOUNTER — Other Ambulatory Visit: Payer: Self-pay | Admitting: Family Medicine

## 2020-05-16 ENCOUNTER — Ambulatory Visit (HOSPITAL_COMMUNITY)
Admission: RE | Admit: 2020-05-16 | Discharge: 2020-05-16 | Disposition: A | Discharge status: Home / Self Care | Payer: Medicare Other | Source: Ambulatory Visit | Attending: Family Medicine | Admitting: Family Medicine

## 2020-05-16 ENCOUNTER — Other Ambulatory Visit: Payer: Self-pay

## 2020-05-16 DIAGNOSIS — R7401 Elevation of levels of liver transaminase levels: Secondary | ICD-10-CM | POA: Diagnosis not present

## 2020-05-16 DIAGNOSIS — M4726 Other spondylosis with radiculopathy, lumbar region: Secondary | ICD-10-CM | POA: Diagnosis not present

## 2020-05-16 DIAGNOSIS — K760 Fatty (change of) liver, not elsewhere classified: Secondary | ICD-10-CM | POA: Diagnosis not present

## 2020-05-16 DIAGNOSIS — M48062 Spinal stenosis, lumbar region with neurogenic claudication: Secondary | ICD-10-CM | POA: Insufficient documentation

## 2020-05-16 DIAGNOSIS — M4319 Spondylolisthesis, multiple sites in spine: Secondary | ICD-10-CM | POA: Diagnosis not present

## 2020-05-16 DIAGNOSIS — R161 Splenomegaly, not elsewhere classified: Secondary | ICD-10-CM | POA: Diagnosis not present

## 2020-05-16 DIAGNOSIS — M4807 Spinal stenosis, lumbosacral region: Secondary | ICD-10-CM | POA: Diagnosis not present

## 2020-05-16 DIAGNOSIS — M48061 Spinal stenosis, lumbar region without neurogenic claudication: Secondary | ICD-10-CM | POA: Diagnosis not present

## 2020-05-16 DIAGNOSIS — G894 Chronic pain syndrome: Secondary | ICD-10-CM | POA: Diagnosis not present

## 2020-06-02 DIAGNOSIS — H903 Sensorineural hearing loss, bilateral: Secondary | ICD-10-CM | POA: Diagnosis not present

## 2020-06-03 DIAGNOSIS — M5136 Other intervertebral disc degeneration, lumbar region: Secondary | ICD-10-CM | POA: Diagnosis not present

## 2020-06-03 DIAGNOSIS — G894 Chronic pain syndrome: Secondary | ICD-10-CM | POA: Diagnosis not present

## 2020-06-03 DIAGNOSIS — M1990 Unspecified osteoarthritis, unspecified site: Secondary | ICD-10-CM | POA: Diagnosis not present

## 2020-06-03 DIAGNOSIS — Z79891 Long term (current) use of opiate analgesic: Secondary | ICD-10-CM | POA: Diagnosis not present

## 2020-06-11 ENCOUNTER — Encounter: Payer: Self-pay | Admitting: Family Medicine

## 2020-06-11 ENCOUNTER — Ambulatory Visit (INDEPENDENT_AMBULATORY_CARE_PROVIDER_SITE_OTHER): Payer: Medicare Other | Admitting: Family Medicine

## 2020-06-11 ENCOUNTER — Other Ambulatory Visit: Payer: Self-pay

## 2020-06-11 VITALS — Temp 97.0°F | Wt 238.8 lb

## 2020-06-11 DIAGNOSIS — E1140 Type 2 diabetes mellitus with diabetic neuropathy, unspecified: Secondary | ICD-10-CM | POA: Diagnosis not present

## 2020-06-11 DIAGNOSIS — Z23 Encounter for immunization: Secondary | ICD-10-CM

## 2020-06-11 DIAGNOSIS — R0989 Other specified symptoms and signs involving the circulatory and respiratory systems: Secondary | ICD-10-CM

## 2020-06-11 DIAGNOSIS — J301 Allergic rhinitis due to pollen: Secondary | ICD-10-CM

## 2020-06-11 MED ORDER — GLIPIZIDE ER 2.5 MG PO TB24: 2.5000 mg | ORAL_TABLET | Freq: Every day | ORAL | 3 refills | Status: DC

## 2020-06-11 NOTE — Progress Notes (Signed)
   Subjective:    Patient ID: Jim [name], male    DOB: [birth year], 64 y.o.   MRN: [id number]  HPI Type 2 diabetes mellitus with diabetic neuropathy, without long-term current use of insulin (HCC)  Chest congestion  Allergic rhinitis due to pollen, unspecified seasonality  Need for vaccination - Plan: Pneumococcal polysaccharide vaccine 23-valent greater than or equal to 2yo subcutaneous/IM Patient with significant head congestion coughing symptoms present over the past several weeks.  He denies any high fever chills sweats wheezing difficulty breathing.  Denies any mucus issues or fevers  States his diabetes overall been doing well takes his medication except for the Ozempic.  He states he just did not want to start this.  So therefore he has been trying to be more mindful of his eating habits he brings in numbers where his morning numbers are in the 1 40-1 80 range he does not check it later later in the day  Review of Systems  Constitutional: Negative for activity change.  HENT: Negative for congestion and rhinorrhea.   Respiratory: Negative for cough and shortness of breath.   Cardiovascular: Negative for chest pain.  Gastrointestinal: Negative for abdominal pain, diarrhea, nausea and vomiting.  Genitourinary: Negative for dysuria and hematuria.  Neurological: Negative for weakness and headaches.  Psychiatric/Behavioral: Negative for behavioral problems and confusion.       Objective:   Physical Exam Constitutional:      General: He is not in acute distress.    Appearance: He is well-developed.  HENT:     Head: Normocephalic.  Cardiovascular:     Rate and Rhythm: Normal rate and regular rhythm.     Heart sounds: Normal heart sounds. No murmur heard.   Pulmonary:     Effort: Pulmonary effort is normal.     Breath sounds: Normal breath sounds.  Skin:    General: Skin is warm and dry.  Neurological:     Mental Status: He is alert.  Psychiatric:        Behavior:  Behavior normal.      No need for x-rays or lab work currently pneumonia shot today     Assessment & Plan:  Mild sinus congestion over the past several weeks more than likely environmental recommend allergy tablets OTC if ongoing trouble follow-up I doubt he has Covid  Diabetes subpar control did not want to take Ozempic so therefore go with glipizide 2.5 mg XL 1 daily recommend recheckIn mid December with glucose readings may need adjustments on medications

## 2020-06-12 DIAGNOSIS — H905 Unspecified sensorineural hearing loss: Secondary | ICD-10-CM | POA: Diagnosis not present

## 2020-06-15 DIAGNOSIS — G894 Chronic pain syndrome: Secondary | ICD-10-CM | POA: Diagnosis not present

## 2020-06-16 ENCOUNTER — Other Ambulatory Visit: Payer: Self-pay | Admitting: *Deleted

## 2020-06-16 MED ORDER — METFORMIN HCL 500 MG PO TABS: ORAL_TABLET | ORAL | 0 refills | Status: DC

## 2020-06-23 DIAGNOSIS — G8929 Other chronic pain: Secondary | ICD-10-CM | POA: Diagnosis not present

## 2020-06-23 DIAGNOSIS — M47816 Spondylosis without myelopathy or radiculopathy, lumbar region: Secondary | ICD-10-CM | POA: Diagnosis not present

## 2020-06-25 ENCOUNTER — Other Ambulatory Visit: Payer: Self-pay | Admitting: Family Medicine

## 2020-06-25 NOTE — Telephone Encounter (Signed)
06/11/20 last check up

## 2020-07-01 DIAGNOSIS — Z79891 Long term (current) use of opiate analgesic: Secondary | ICD-10-CM | POA: Diagnosis not present

## 2020-07-01 DIAGNOSIS — M5136 Other intervertebral disc degeneration, lumbar region: Secondary | ICD-10-CM | POA: Diagnosis not present

## 2020-07-01 DIAGNOSIS — M1990 Unspecified osteoarthritis, unspecified site: Secondary | ICD-10-CM | POA: Diagnosis not present

## 2020-07-01 DIAGNOSIS — G894 Chronic pain syndrome: Secondary | ICD-10-CM | POA: Diagnosis not present

## 2020-07-08 ENCOUNTER — Telehealth: Payer: Self-pay | Admitting: *Deleted

## 2020-07-08 NOTE — Telephone Encounter (Signed)
Pt called and states he is having injections in his back on 12/10 and was told to call and asked if he needed to stop his blood thinner. He is on plavix and asa.

## 2020-07-08 NOTE — Telephone Encounter (Signed)
Nurses If the injection is going to be in the epidural area he ought to be off of this medication for 7 days before having injection.  Please make sure the patient is aware of this he should let the people know who are doing the back injections so that they would wait 7 days from stopping Plavix and aspirin If the injections go well he may resume the medication the following day

## 2020-07-08 NOTE — Telephone Encounter (Signed)
Discussed with pt. Pt verbalized understanding.  °

## 2020-07-11 DIAGNOSIS — M47816 Spondylosis without myelopathy or radiculopathy, lumbar region: Secondary | ICD-10-CM | POA: Diagnosis not present

## 2020-07-15 DIAGNOSIS — G894 Chronic pain syndrome: Secondary | ICD-10-CM | POA: Diagnosis not present

## 2020-07-16 ENCOUNTER — Encounter: Payer: Self-pay | Admitting: Family Medicine

## 2020-07-16 ENCOUNTER — Ambulatory Visit (INDEPENDENT_AMBULATORY_CARE_PROVIDER_SITE_OTHER): Payer: Medicare Other | Admitting: Family Medicine

## 2020-07-16 ENCOUNTER — Other Ambulatory Visit: Payer: Self-pay

## 2020-07-16 VITALS — BP 124/70 | HR 78 | Temp 97.8°F | Ht 69.0 in | Wt 240.0 lb

## 2020-07-16 DIAGNOSIS — M791 Myalgia, unspecified site: Secondary | ICD-10-CM

## 2020-07-16 DIAGNOSIS — E119 Type 2 diabetes mellitus without complications: Secondary | ICD-10-CM | POA: Diagnosis not present

## 2020-07-16 DIAGNOSIS — E785 Hyperlipidemia, unspecified: Secondary | ICD-10-CM

## 2020-07-16 DIAGNOSIS — T466X5A Adverse effect of antihyperlipidemic and antiarteriosclerotic drugs, initial encounter: Secondary | ICD-10-CM

## 2020-07-16 DIAGNOSIS — M48062 Spinal stenosis, lumbar region with neurogenic claudication: Secondary | ICD-10-CM | POA: Diagnosis not present

## 2020-07-16 DIAGNOSIS — Z79899 Other long term (current) drug therapy: Secondary | ICD-10-CM | POA: Diagnosis not present

## 2020-07-16 DIAGNOSIS — F321 Major depressive disorder, single episode, moderate: Secondary | ICD-10-CM

## 2020-07-16 LAB — POCT GLYCOSYLATED HEMOGLOBIN (HGB A1C): Hemoglobin A1C: 6.5 % — AB (ref 4.0–5.6)

## 2020-07-16 MED ORDER — NALOXONE HCL 4 MG/0.1ML NA LIQD: NASAL | 0 refills | Status: AC

## 2020-07-16 NOTE — Progress Notes (Signed)
Subjective:    Patient ID: Jim [name], male    DOB: February 11, 1956, 64 y.o.   MRN: [id number]  HPIdiabetes. Pt left sugar readings at home. Pt states he takes meds every day. Has seen ophthalmology within the past year.   Pt states his pain meds are not helping his pain. He is seeing pain management. Had injections last week and pain has been worse.   Results for orders placed or performed in visit on 07/16/20  POCT glycosylated hemoglobin (Hb A1C)  Result Value Ref Range   Hemoglobin A1C 6.5 (A) 4.0 - 5.6 %   HbA1c POC (<> result, manual entry)     HbA1c, POC (prediabetic range)     HbA1c, POC (controlled diabetic range)          Review of Systems  Constitutional: Positive for fatigue. Negative for diaphoresis.  HENT: Negative for congestion and rhinorrhea.   Respiratory: Negative for cough and shortness of breath.   Cardiovascular: Negative for chest pain and leg swelling.  Gastrointestinal: Negative for abdominal pain and diarrhea.  Musculoskeletal: Positive for arthralgias and back pain.  Skin: Negative for color change and rash.  Neurological: Negative for dizziness and headaches.  Psychiatric/Behavioral: Negative for behavioral problems and confusion.       Objective:   Physical Exam Vitals reviewed.  Constitutional:      General: He is not in acute distress.    Appearance: He is well-nourished.  HENT:     Head: Normocephalic and atraumatic.  Eyes:     General:        Right eye: No discharge.        Left eye: No discharge.  Neck:     Trachea: No tracheal deviation.  Cardiovascular:     Rate and Rhythm: Normal rate and regular rhythm.     Heart sounds: Normal heart sounds. No murmur heard.   Pulmonary:     Effort: Pulmonary effort is normal. No respiratory distress.     Breath sounds: Normal breath sounds.  Musculoskeletal:        General: No edema.  Lymphadenopathy:     Cervical: No cervical adenopathy.  Skin:    General: Skin is warm and dry.   Neurological:     Mental Status: He is alert.     Coordination: Coordination normal.  Psychiatric:        Mood and Affect: Mood and affect normal.        Behavior: Behavior normal.           Assessment & Plan:  1. Diabetes mellitus without complication ([place]) Overall decent control doing better with the current regimen he is to work hard on diet portion control staying away from liquids with sugars - POCT glycosylated hemoglobin (Hb A1C) - Hemoglobin A1c  2. Spinal stenosis of lumbar region with neurogenic claudication Referral to orthopedic surgery he is already under the care of a pain management specialist but I have encouraged him not to go up on pain medicines and I encouraged him to stick with the regiment or a little less to lessen the dangers I have given him a prescription for Narcan with the instructions of if ever having to use it to also notify 911 and to continue under the care of pain management and as time moves forward and hopefully improves to be on less medicine - Ambulatory referral to Orthopedic Surgery  3. Depression, major, single episode, moderate ([place]) Continue antidepressant patient denies being suicidal.  I have encouraged  him to have better structure for his day with sleeping only at nighttime but not during the daytime except for short nap  4. Hyperlipidemia, unspecified hyperlipidemia type Continue medications patient does not tolerate statins but he is trying just 2 days/week we will see how this goes - Lipid panel  5. High risk medication use Metabolic 7 - Basic metabolic panel

## 2020-07-24 ENCOUNTER — Other Ambulatory Visit: Payer: Self-pay | Admitting: Family Medicine

## 2020-07-29 ENCOUNTER — Telehealth: Payer: Self-pay

## 2020-07-29 DIAGNOSIS — Z79891 Long term (current) use of opiate analgesic: Secondary | ICD-10-CM | POA: Diagnosis not present

## 2020-07-29 DIAGNOSIS — G894 Chronic pain syndrome: Secondary | ICD-10-CM | POA: Diagnosis not present

## 2020-07-29 DIAGNOSIS — M5136 Other intervertebral disc degeneration, lumbar region: Secondary | ICD-10-CM | POA: Diagnosis not present

## 2020-07-29 DIAGNOSIS — M1990 Unspecified osteoarthritis, unspecified site: Secondary | ICD-10-CM | POA: Diagnosis not present

## 2020-07-29 NOTE — Telephone Encounter (Signed)
Patient would like to be sent somewhere in [place] or [place] for counseling for substance abused.  He has been somewhere in [place] before but has problems with transportation.

## 2020-07-29 NOTE — Telephone Encounter (Signed)
Please advise. Thank you

## 2020-07-30 DIAGNOSIS — M47816 Spondylosis without myelopathy or radiculopathy, lumbar region: Secondary | ICD-10-CM | POA: Diagnosis not present

## 2020-08-01 ENCOUNTER — Other Ambulatory Visit: Payer: Self-pay | Admitting: Family Medicine

## 2020-08-06 ENCOUNTER — Ambulatory Visit (INDEPENDENT_AMBULATORY_CARE_PROVIDER_SITE_OTHER): Payer: Medicare Other | Admitting: Family Medicine

## 2020-08-06 ENCOUNTER — Other Ambulatory Visit: Payer: Self-pay

## 2020-08-06 ENCOUNTER — Encounter: Payer: Self-pay | Admitting: Family Medicine

## 2020-08-06 ENCOUNTER — Ambulatory Visit (HOSPITAL_COMMUNITY)
Admission: RE | Admit: 2020-08-06 | Discharge: 2020-08-06 | Disposition: A | Discharge status: Home / Self Care | Payer: Medicare Other | Source: Ambulatory Visit | Attending: Family Medicine | Admitting: Family Medicine

## 2020-08-06 VITALS — BP 130/70 | HR 88 | Temp 97.2°F | Wt 238.4 lb

## 2020-08-06 DIAGNOSIS — M25511 Pain in right shoulder: Secondary | ICD-10-CM | POA: Insufficient documentation

## 2020-08-06 DIAGNOSIS — F1190 Opioid use, unspecified, uncomplicated: Secondary | ICD-10-CM | POA: Diagnosis not present

## 2020-08-06 DIAGNOSIS — F321 Major depressive disorder, single episode, moderate: Secondary | ICD-10-CM

## 2020-08-06 DIAGNOSIS — M19011 Primary osteoarthritis, right shoulder: Secondary | ICD-10-CM | POA: Diagnosis not present

## 2020-08-06 DIAGNOSIS — S4991XA Unspecified injury of right shoulder and upper arm, initial encounter: Secondary | ICD-10-CM | POA: Diagnosis not present

## 2020-08-06 MED ORDER — BUPROPION HCL ER (XL) 150 MG PO TB24: 150.0000 mg | ORAL_TABLET | Freq: Every day | ORAL | 4 refills | Status: DC

## 2020-08-06 NOTE — Progress Notes (Signed)
   Subjective:    Patient ID: Jim [name], male    DOB: [birth year], 65 y.o.   MRN: [id number]  Shoulder Pain  The pain is present in the right shoulder. This is a new problem. Episode onset: Wednesday  The quality of the pain is described as sharp. Associated symptoms include a limited range of motion and tingling. Associated symptoms comments: Tingling in lower right arm . The symptoms are aggravated by activity. He has tried heat (Oxycodone ) for the symptoms. The treatment provided mild relief.   Patient with increased shoulder pain discomfort after he was pulled down by his dog denied falling on the shoulder but just states it has been hurting ever since.  Patient states that he needs to be referred for counseling for opioid use he states that he does take his pain medicine but his pain medicine doctor said that he must get some counseling because at times when he is overwhelmed with the bit of discomfort he states he does take more pain medicine than what he needs which sometimes can cause him to run out of medicine he states he is trying to stick with the plan and he denies using other peoples medicines denies using illegal drugs. Review of Systems  Neurological: Positive for tingling.       Objective:   Physical Exam  He does not have any weakness of the distal extremity but he does have decreased range of motion of the shoulder lungs clear heart regular  He has difficult time holding his arm up when it is lifted up for him.  This is a positive drop test    Assessment & Plan:  It is hard to know if this is from the pain or if it is truly a complete rotator cuff tear referral to orthopedics for further evaluation may end up needing to have injection and physical therapy possibly even an MRI  Patient is already on pain medication with his pain medicine doctor  Currently pain medicine doctor is requesting that he go through regular counseling for anxiety depression as well as  substance misuse.  He denies taking other medications he just states at times when he is in a lot of pain he takes more than what he needs but he is trying to keep this under control  Patient will follow up with [place] within the next 4 weeks  We will send him a letter via MyChart stating that we have referred him for counseling

## 2020-08-06 NOTE — Telephone Encounter (Signed)
FYI I discussed this with the patient today patient being referred

## 2020-08-07 ENCOUNTER — Telehealth: Payer: Self-pay | Admitting: Family Medicine

## 2020-08-07 NOTE — Telephone Encounter (Signed)
patient is requesting a right arm sling from [employer]

## 2020-08-07 NOTE — Telephone Encounter (Signed)
Script written and will fax after dr scott signs. Pt was notified.

## 2020-08-08 DIAGNOSIS — M25511 Pain in right shoulder: Secondary | ICD-10-CM | POA: Diagnosis not present

## 2020-08-08 DIAGNOSIS — M542 Cervicalgia: Secondary | ICD-10-CM | POA: Diagnosis not present

## 2020-08-13 DIAGNOSIS — M47816 Spondylosis without myelopathy or radiculopathy, lumbar region: Secondary | ICD-10-CM | POA: Diagnosis not present

## 2020-08-14 DIAGNOSIS — G894 Chronic pain syndrome: Secondary | ICD-10-CM | POA: Diagnosis not present

## 2020-08-18 ENCOUNTER — Other Ambulatory Visit: Payer: Self-pay | Admitting: Family Medicine

## 2020-08-19 NOTE — Telephone Encounter (Signed)
6 months refill both

## 2020-08-22 DIAGNOSIS — M25511 Pain in right shoulder: Secondary | ICD-10-CM | POA: Diagnosis not present

## 2020-08-26 DIAGNOSIS — M5136 Other intervertebral disc degeneration, lumbar region: Secondary | ICD-10-CM | POA: Diagnosis not present

## 2020-08-26 DIAGNOSIS — E1140 Type 2 diabetes mellitus with diabetic neuropathy, unspecified: Secondary | ICD-10-CM | POA: Diagnosis not present

## 2020-08-26 DIAGNOSIS — Z79891 Long term (current) use of opiate analgesic: Secondary | ICD-10-CM | POA: Diagnosis not present

## 2020-08-26 DIAGNOSIS — G894 Chronic pain syndrome: Secondary | ICD-10-CM | POA: Diagnosis not present

## 2020-08-27 ENCOUNTER — Ambulatory Visit (INDEPENDENT_AMBULATORY_CARE_PROVIDER_SITE_OTHER): Payer: Medicare Other | Admitting: Psychologist

## 2020-08-27 DIAGNOSIS — F330 Major depressive disorder, recurrent, mild: Secondary | ICD-10-CM

## 2020-08-28 ENCOUNTER — Other Ambulatory Visit: Payer: Self-pay | Admitting: Family Medicine

## 2020-08-29 DIAGNOSIS — M47816 Spondylosis without myelopathy or radiculopathy, lumbar region: Secondary | ICD-10-CM | POA: Diagnosis not present

## 2020-09-03 ENCOUNTER — Other Ambulatory Visit: Payer: Self-pay | Admitting: Family Medicine

## 2020-09-04 ENCOUNTER — Other Ambulatory Visit: Payer: Self-pay

## 2020-09-04 ENCOUNTER — Encounter: Payer: Self-pay | Admitting: Family Medicine

## 2020-09-04 ENCOUNTER — Ambulatory Visit (INDEPENDENT_AMBULATORY_CARE_PROVIDER_SITE_OTHER): Payer: Medicare Other | Admitting: Family Medicine

## 2020-09-04 VITALS — BP 124/64 | HR 74 | Temp 97.6°F | Wt 238.8 lb

## 2020-09-04 DIAGNOSIS — M25512 Pain in left shoulder: Secondary | ICD-10-CM

## 2020-09-04 DIAGNOSIS — F324 Major depressive disorder, single episode, in partial remission: Secondary | ICD-10-CM | POA: Diagnosis not present

## 2020-09-04 DIAGNOSIS — F1190 Opioid use, unspecified, uncomplicated: Secondary | ICD-10-CM | POA: Diagnosis not present

## 2020-09-04 DIAGNOSIS — G8929 Other chronic pain: Secondary | ICD-10-CM

## 2020-09-04 NOTE — Progress Notes (Signed)
m °

## 2020-09-04 NOTE — Progress Notes (Signed)
   Subjective:    Patient ID: Jim [name], male    DOB: [birth year], 65 y.o.   MRN: [id number]  HPI Pt here for follow up on shoulder pain and depression. Shoulder pain is not any better; pain level 7. Pt was placed on Wellbutrin on 08/06/20 and is doing well on med. No issues.   Patient states he feels his depression is doing very well denies being sad or blue finds himself doing very well day today did go to counseling for 1 session they basically told him they were not sure what they could do for him  He is now having his wife distribute his pain medicine 1 dose at a time She was present for the previous visit where we discussed the importance of sticking with what the pain management doctor sets for for him and not taking additional medicines beyond what is prescribed to his wife stated that she would work with him closely Review of Systems Still complains of some shoulder pain had an injection they will be doing physical therapy in the near future if that does not undergo an MRI    Objective:   Physical Exam His lungs are clear heart regular HEENT benign subjective shoulder pain       Assessment & Plan:  1. Depression, major, single episode, in partial remission ([place]) Continue antidepressants follow-up as planned in the spring wellness exam in the near future  2. Chronic left shoulder pain Follow through with seeing orthopedics they may need to do an MRI  3. Chronic, continuous use of opioids Continue to have his wife dispersed 1 pill at a time.  Do not use medicine not prescribed by the pain medicine doctor follow-up if progressive troubles  Wellness exam near future

## 2020-09-05 ENCOUNTER — Other Ambulatory Visit: Payer: Self-pay | Admitting: Family Medicine

## 2020-09-13 DIAGNOSIS — G894 Chronic pain syndrome: Secondary | ICD-10-CM | POA: Diagnosis not present

## 2020-09-22 ENCOUNTER — Other Ambulatory Visit: Payer: Self-pay | Admitting: Family Medicine

## 2020-09-29 DIAGNOSIS — G894 Chronic pain syndrome: Secondary | ICD-10-CM | POA: Diagnosis not present

## 2020-09-30 DIAGNOSIS — E119 Type 2 diabetes mellitus without complications: Secondary | ICD-10-CM | POA: Diagnosis not present

## 2020-09-30 DIAGNOSIS — H524 Presbyopia: Secondary | ICD-10-CM | POA: Diagnosis not present

## 2020-10-08 ENCOUNTER — Encounter: Payer: Medicare Other | Admitting: Family Medicine

## 2020-10-13 DIAGNOSIS — G894 Chronic pain syndrome: Secondary | ICD-10-CM | POA: Diagnosis not present

## 2020-10-17 ENCOUNTER — Other Ambulatory Visit: Payer: Self-pay | Admitting: Family Medicine

## 2020-10-21 ENCOUNTER — Encounter: Payer: Self-pay | Admitting: Family Medicine

## 2020-10-21 ENCOUNTER — Other Ambulatory Visit: Payer: Self-pay

## 2020-10-21 ENCOUNTER — Ambulatory Visit (INDEPENDENT_AMBULATORY_CARE_PROVIDER_SITE_OTHER): Payer: Medicare Other | Admitting: Family Medicine

## 2020-10-21 VITALS — BP 108/70 | HR 85 | Temp 97.4°F | Ht 69.0 in | Wt 223.0 lb

## 2020-10-21 DIAGNOSIS — E119 Type 2 diabetes mellitus without complications: Secondary | ICD-10-CM | POA: Diagnosis not present

## 2020-10-21 DIAGNOSIS — E1169 Type 2 diabetes mellitus with other specified complication: Secondary | ICD-10-CM | POA: Insufficient documentation

## 2020-10-21 DIAGNOSIS — E1140 Type 2 diabetes mellitus with diabetic neuropathy, unspecified: Secondary | ICD-10-CM | POA: Diagnosis not present

## 2020-10-21 DIAGNOSIS — Z79899 Other long term (current) drug therapy: Secondary | ICD-10-CM | POA: Diagnosis not present

## 2020-10-21 DIAGNOSIS — I10 Essential (primary) hypertension: Secondary | ICD-10-CM | POA: Diagnosis not present

## 2020-10-21 DIAGNOSIS — E785 Hyperlipidemia, unspecified: Secondary | ICD-10-CM

## 2020-10-21 MED ORDER — METFORMIN HCL 500 MG PO TABS: ORAL_TABLET | ORAL | 1 refills | Status: DC

## 2020-10-21 MED ORDER — ROSUVASTATIN CALCIUM 5 MG PO TABS
ORAL_TABLET | ORAL | 3 refills | Status: DC
Start: 2020-10-21 — End: 2021-05-27

## 2020-10-21 MED ORDER — ALBUTEROL SULFATE HFA 108 (90 BASE) MCG/ACT IN AERS: 2.0000 | INHALATION_SPRAY | Freq: Four times a day (QID) | RESPIRATORY_TRACT | 6 refills | Status: AC | PRN

## 2020-10-21 MED ORDER — CLOPIDOGREL BISULFATE 75 MG PO TABS: 75.0000 mg | ORAL_TABLET | Freq: Every day | ORAL | 1 refills | Status: DC

## 2020-10-21 MED ORDER — BUPROPION HCL ER (XL) 150 MG PO TB24: 150.0000 mg | ORAL_TABLET | Freq: Every day | ORAL | 4 refills | Status: DC

## 2020-10-21 MED ORDER — TRAZODONE HCL 50 MG PO TABS: ORAL_TABLET | ORAL | 1 refills | Status: DC

## 2020-10-21 NOTE — Progress Notes (Signed)
   Subjective:    Patient ID: Jim [name], male    DOB: [birth year], 65 y.o.   MRN: [id number]  Diabetes He presents for his follow-up diabetic visit. He has type 2 diabetes mellitus. Pertinent negatives for hypoglycemia include no confusion or headaches. Pertinent negatives for diabetes include no chest pain and no weakness. Current diabetic treatments: glipizide, metformin, januvia.   Requesting disability parking placard.  Had diabetic eye exam on 09/30/20.    Review of Systems  Constitutional: Negative for activity change.  HENT: Negative for congestion and rhinorrhea.   Respiratory: Negative for cough and shortness of breath.   Cardiovascular: Negative for chest pain.  Gastrointestinal: Negative for abdominal pain, diarrhea, nausea and vomiting.  Genitourinary: Negative for dysuria and hematuria.  Neurological: Negative for weakness and headaches.  Psychiatric/Behavioral: Negative for behavioral problems and confusion.       Objective:   Physical Exam Vitals reviewed.  Constitutional:      General: He is not in acute distress. HENT:     Head: Normocephalic and atraumatic.  Eyes:     General:        Right eye: No discharge.        Left eye: No discharge.  Neck:     Trachea: No tracheal deviation.  Cardiovascular:     Rate and Rhythm: Normal rate and regular rhythm.     Heart sounds: Normal heart sounds. No murmur heard.   Pulmonary:     Effort: Pulmonary effort is normal. No respiratory distress.     Breath sounds: Normal breath sounds.  Lymphadenopathy:     Cervical: No cervical adenopathy.  Skin:    General: Skin is warm and dry.  Neurological:     Mental Status: He is alert.     Coordination: Coordination normal.  Psychiatric:        Behavior: Behavior normal.           Assessment & Plan:  1. HTN (hypertension), benign Blood pressure good control continue current measures  2. Type 2 diabetes mellitus with diabetic neuropathy, without long-term  current use of insulin ([place]) Recent lab work from September looks good continue to watch diet stay active Do additional labs as ordered back in December that he never got done 3. Hyperlipidemia associated with type 2 diabetes mellitus ([place]) Do labs that were ordered previously watch medicine continue medication watch diet

## 2020-10-22 LAB — BASIC METABOLIC PANEL
BUN/Creatinine Ratio: 12 (ref 10–24)
BUN: 13 mg/dL (ref 8–27)
CO2: 21 mmol/L (ref 20–29)
Calcium: 9.9 mg/dL (ref 8.6–10.2)
Chloride: 103 mmol/L (ref 96–106)
Creatinine, Ser: 1.07 mg/dL (ref 0.76–1.27)
Glucose: 151 mg/dL — ABNORMAL HIGH (ref 65–99)
Potassium: 4.9 mmol/L (ref 3.5–5.2)
Sodium: 140 mmol/L (ref 134–144)
eGFR: 77 mL/min/{1.73_m2} (ref >59)

## 2020-10-22 LAB — LIPID PANEL
Chol/HDL Ratio: 4.9 ratio (ref 0.0–5.0)
Cholesterol, Total: 201 mg/dL — ABNORMAL HIGH (ref 100–199)
HDL: 41 mg/dL (ref >39)
LDL Chol Calc (NIH): 111 mg/dL — ABNORMAL HIGH (ref 0–99)
Triglycerides: 281 mg/dL — ABNORMAL HIGH (ref 0–149)
VLDL Cholesterol Cal: 49 mg/dL — ABNORMAL HIGH (ref 5–40)

## 2020-10-22 LAB — HEMOGLOBIN A1C
Est. average glucose Bld gHb Est-mCnc: 163 mg/dL
Hgb A1c MFr Bld: 7.3 % — ABNORMAL HIGH (ref 4.8–5.6)

## 2020-10-27 DIAGNOSIS — G894 Chronic pain syndrome: Secondary | ICD-10-CM | POA: Diagnosis not present

## 2020-11-10 ENCOUNTER — Telehealth: Payer: Self-pay | Admitting: Family Medicine

## 2020-11-10 NOTE — Telephone Encounter (Signed)
Left message for patient to schedule Annual Wellness Visit.  Please schedule with Nurse Health Advisor Shannon Crews, RN at [facility]  

## 2020-11-12 DIAGNOSIS — G894 Chronic pain syndrome: Secondary | ICD-10-CM | POA: Diagnosis not present

## 2020-11-18 ENCOUNTER — Other Ambulatory Visit: Payer: Self-pay | Admitting: Family Medicine

## 2020-11-24 DIAGNOSIS — G894 Chronic pain syndrome: Secondary | ICD-10-CM | POA: Diagnosis not present

## 2020-12-02 ENCOUNTER — Telehealth: Payer: Self-pay

## 2020-12-02 NOTE — Telephone Encounter (Signed)
Pt has block on phone leave message.

## 2020-12-02 NOTE — Telephone Encounter (Signed)
Pt will need appt. Needs appt to reevaluate shoulder pain since it is worse and for proper documentation. Thank you!

## 2020-12-02 NOTE — Telephone Encounter (Signed)
Pt wants MRI ordered on his right shoulder. Dr Nicki Reaper has already seen him about this sent him to Dr Dayna Ramus? Office and they give him a shot in his should and it is worse than before.  Pt call back [phone]

## 2020-12-03 NOTE — Telephone Encounter (Signed)
Made appointment for 5/9 at 8:40

## 2020-12-05 DIAGNOSIS — M47816 Spondylosis without myelopathy or radiculopathy, lumbar region: Secondary | ICD-10-CM | POA: Diagnosis not present

## 2020-12-05 DIAGNOSIS — M48062 Spinal stenosis, lumbar region with neurogenic claudication: Secondary | ICD-10-CM | POA: Diagnosis not present

## 2020-12-05 DIAGNOSIS — M5416 Radiculopathy, lumbar region: Secondary | ICD-10-CM | POA: Diagnosis not present

## 2020-12-08 ENCOUNTER — Ambulatory Visit: Payer: Medicare Other | Admitting: Family Medicine

## 2020-12-10 ENCOUNTER — Encounter: Payer: Self-pay | Admitting: Family Medicine

## 2020-12-10 ENCOUNTER — Other Ambulatory Visit: Payer: Self-pay

## 2020-12-10 ENCOUNTER — Ambulatory Visit (INDEPENDENT_AMBULATORY_CARE_PROVIDER_SITE_OTHER): Payer: Medicare Other | Admitting: Family Medicine

## 2020-12-10 VITALS — BP 143/79 | HR 56 | Temp 97.9°F | Wt 223.8 lb

## 2020-12-10 DIAGNOSIS — M25511 Pain in right shoulder: Secondary | ICD-10-CM | POA: Insufficient documentation

## 2020-12-10 DIAGNOSIS — M48062 Spinal stenosis, lumbar region with neurogenic claudication: Secondary | ICD-10-CM | POA: Insufficient documentation

## 2020-12-10 NOTE — Progress Notes (Signed)
Patient ID: Jim [name], male    DOB: 06-Mar-1956, 65 y.o.   MRN: [id number]   Chief Complaint  Patient presents with  . Shoulder Pain   Subjective:  CC: right shoulder pain  This is not a new problem.  Presents today to follow-up on shoulder pain.  Was initially seen at this office on January 5, by Dr. Sallee Lange for new onset shoulder pain after a fall.  X-ray done at that time which was negative for fracture.  Referral sent to orthopedic specialist, has seen orthopedics, recently injected with steroid with no improvement.  Reports that pain is constant, unable to lift arm overhead.  Presents today wishes for MRI order to be placed.  Symptoms have been present for greater than 5 months.  Sees a pain management specialist for chronic pain, takes oxycodone 6 times per day which does not relieve this shoulder pain.  Orthopedic specialist ordered physical therapy, reports that he is unable to perform physical therapy due to intense pain.   Pt here for right shoulder pain. Pt had a fall a few months ago. Pt states pain goes down to arm. Pt had xrays and a steroid shot (Dr.Wainer). pt tripped over dog. Pt was told to try PT but states "it hurt so bad I knew I couldn't do it"    Medical History Chosen has a past medical history of Arthritis, Back pain, Bursitis of hip, Depression, DJD (degenerative joint disease) of knee, Ex-smoker, Gastric ulcer, History of colon polyps, Hyperlipidemia, Primary localized osteoarthritis of right knee, and PVD (peripheral vascular disease) with claudication ([place]) (05/21/2018).   Outpatient Encounter Medications as of 12/10/2020  Medication Sig  . albuterol (VENTOLIN HFA) 108 (90 Base) MCG/ACT inhaler Inhale 2 puffs into the lungs every 6 (six) hours as needed for wheezing.  Marland Kitchen aspirin EC 81 MG tablet Take 81 mg by mouth daily.  . blood glucose meter kit and supplies Dispense based on patient and insurance preference. Use to check glucose once daily as directed.  (FOR ICD-10 E11.9).  Marland Kitchen buPROPion (WELLBUTRIN XL) 150 MG 24 hr tablet Take 1 tablet (150 mg total) by mouth daily.  . clopidogrel (PLAVIX) 75 MG tablet Take 1 tablet (75 mg total) by mouth daily.  Marland Kitchen ezetimibe (ZETIA) 10 MG tablet TAKE 1 TABLET BY MOUTH EVERY DAY  . FLUoxetine (PROZAC) 20 MG capsule TAKE 3 CAPSULES BY MOUTH EVERY DAY  . glipiZIDE (GLUCOTROL XL) 2.5 MG 24 hr tablet TAKE 1 TABLET BY MOUTH DAILY WITH BREAKFAST.  Marland Kitchen lisinopril (ZESTRIL) 5 MG tablet TAKE 1 TABLET BY MOUTH EVERY DAY  . metFORMIN (GLUCOPHAGE) 500 MG tablet TAKE 2 TABLETS BY MOUTH EVERY MORNING AND 2 TABLETS AT SUPPER  . naloxone (NARCAN) nasal spray 4 mg/0.1 mL Use as directed  . ONETOUCH VERIO test strip USE TO CHECK BLOOD SUGAR LEVELS ONCE DAILY  . OVER THE COUNTER MEDICATION One a day vitamin -takes 2 am and 2 pm  . Oxycodone HCl 20 MG TABS TK 1/2-1 T 3-5 TIMES DAILY IF TOLERATED  . rosuvastatin (CRESTOR) 5 MG tablet 1q Monday and 1 on fridays  . sitaGLIPtin (JANUVIA) 50 MG tablet Take 1 tablet (50 mg total) by mouth daily.  . traZODone (DESYREL) 50 MG tablet 1 qhs prn insomnia   No facility-administered encounter medications on file as of 12/10/2020.     Review of Systems  Constitutional: Negative for chills and fever.  Respiratory: Negative for shortness of breath.   Cardiovascular: Negative for chest pain  and leg swelling.  Gastrointestinal: Negative for abdominal pain.  Musculoskeletal: Negative for joint swelling.       Right shoulder pain from fall in January 2022. Pain is radiating at times.      Vitals BP (!) 143/79   Pulse (!) 56   Temp 97.9 F (36.6 C)   Wt 223 lb 12.8 oz (101.5 kg)   SpO2 96%   BMI 33.05 kg/m   Objective:   Physical Exam Vitals reviewed.  Cardiovascular:     Rate and Rhythm: Normal rate and regular rhythm.     Heart sounds: Normal heart sounds.  Pulmonary:     Effort: Pulmonary effort is normal.     Breath sounds: Normal breath sounds.  Musculoskeletal:         General: Signs of injury (fell onto right shoulder in December or january) present. No swelling.     Comments: ROM decreased. Has seen ortho for injection, unable to go to PT due to pain  Skin:    General: Skin is warm and dry.  Neurological:     General: No focal deficit present.     Mental Status: He is alert.  Psychiatric:        Behavior: Behavior normal.      Assessment and Plan   1. Right shoulder pain, unspecified chronicity - MR SHOULDER RIGHT WO CONTRAST   Blood pressure elevated in office, recently took his lisinopril before this visit before therapeutic effect.  Shoulder pain and decreased range of motion present for 5 months, has seen orthopedic specialist, joint injection, referral for physical therapy, which he does not feel he is capable of completing.  Will place order for MRI, results will be sent to PCP, Dr. Sallee Lange.  Agrees with plan of care discussed today. Understands warning signs to seek further care: chest pain, shortness of breath, any significant change in health.  Understands to follow-up with Dr. Sallee Lange, PCP, after MRI results are available.   Chalmers Guest, NP 12/10/2020

## 2020-12-10 NOTE — Patient Instructions (Signed)
Shoulder Pain Many things can cause shoulder pain, including:  An injury to the shoulder.  Overuse of the shoulder.  Arthritis. The source of the pain can be:  Inflammation.  An injury to the shoulder joint.  An injury to a tendon, ligament, or bone. Follow these instructions at home: Pay attention to changes in your symptoms. Let your health care provider know about them. Follow these instructions to relieve your pain. If you have a sling:  Wear the sling as told by your health care provider. Remove it only as told by your health care provider.  Loosen the sling if your fingers tingle, become numb, or turn cold and blue.  Keep the sling clean.  If the sling is not waterproof: ? Do not let it get wet. Remove it to shower or bathe.  Move your arm as little as possible, but keep your hand moving to prevent swelling. Managing pain, stiffness, and swelling  If directed, put ice on the painful area: ? Put ice in a plastic bag. ? Place a towel between your skin and the bag. ? Leave the ice on for 20 minutes, 2-3 times per day. Stop applying ice if it does not help with the pain.  Squeeze a soft ball or a foam pad as much as possible. This helps to keep the shoulder from swelling. It also helps to strengthen the arm.   General instructions  Take over-the-counter and prescription medicines only as told by your health care provider.  Keep all follow-up visits as told by your health care provider. This is important. Contact a health care provider if:  Your pain gets worse.  Your pain is not relieved with medicines.  New pain develops in your arm, hand, or fingers. Get help right away if:  Your arm, hand, or fingers: ? Tingle. ? Become numb. ? Become swollen. ? Become painful. ? Turn white or blue. Summary  Shoulder pain can be caused by an injury, overuse, or arthritis.  Pay attention to changes in your symptoms. Let your health care provider know about  them.  This condition may be treated with a sling, ice, and pain medicines.  Contact your health care provider if the pain gets worse or new pain develops. Get help right away if your arm, hand, or fingers tingle or become numb, swollen, or painful.  Keep all follow-up visits as told by your health care provider. This is important. This information is not intended to replace advice given to you by your health care provider. Make sure you discuss any questions you have with your health care provider. Document Revised: 01/31/2018 Document Reviewed: 01/31/2018 Elsevier Patient Education  2021 Elsevier Inc.  

## 2020-12-16 ENCOUNTER — Telehealth: Payer: Self-pay | Admitting: Family Medicine

## 2020-12-24 DIAGNOSIS — G894 Chronic pain syndrome: Secondary | ICD-10-CM | POA: Diagnosis not present

## 2020-12-25 ENCOUNTER — Encounter: Payer: Self-pay | Admitting: Family Medicine

## 2020-12-31 ENCOUNTER — Ambulatory Visit (HOSPITAL_COMMUNITY)
Admission: RE | Admit: 2020-12-31 | Discharge: 2020-12-31 | Disposition: A | Discharge status: Home / Self Care | Payer: Medicare Other | Source: Ambulatory Visit | Attending: Family Medicine | Admitting: Family Medicine

## 2020-12-31 ENCOUNTER — Other Ambulatory Visit: Payer: Self-pay

## 2020-12-31 DIAGNOSIS — M25511 Pain in right shoulder: Secondary | ICD-10-CM | POA: Diagnosis not present

## 2020-12-31 DIAGNOSIS — M48062 Spinal stenosis, lumbar region with neurogenic claudication: Secondary | ICD-10-CM | POA: Diagnosis not present

## 2020-12-31 DIAGNOSIS — M5416 Radiculopathy, lumbar region: Secondary | ICD-10-CM | POA: Diagnosis not present

## 2021-01-01 ENCOUNTER — Other Ambulatory Visit: Payer: Self-pay

## 2021-01-01 DIAGNOSIS — M75101 Unspecified rotator cuff tear or rupture of right shoulder, not specified as traumatic: Secondary | ICD-10-CM

## 2021-01-11 ENCOUNTER — Other Ambulatory Visit: Payer: Self-pay | Admitting: Family Medicine

## 2021-01-11 DIAGNOSIS — G894 Chronic pain syndrome: Secondary | ICD-10-CM | POA: Diagnosis not present

## 2021-01-19 DIAGNOSIS — M5136 Other intervertebral disc degeneration, lumbar region: Secondary | ICD-10-CM | POA: Diagnosis not present

## 2021-01-19 DIAGNOSIS — G894 Chronic pain syndrome: Secondary | ICD-10-CM | POA: Diagnosis not present

## 2021-01-19 DIAGNOSIS — M545 Low back pain, unspecified: Secondary | ICD-10-CM | POA: Diagnosis not present

## 2021-01-19 DIAGNOSIS — M169 Osteoarthritis of hip, unspecified: Secondary | ICD-10-CM | POA: Diagnosis not present

## 2021-01-31 ENCOUNTER — Other Ambulatory Visit: Payer: Self-pay | Admitting: Family Medicine

## 2021-02-10 DIAGNOSIS — G894 Chronic pain syndrome: Secondary | ICD-10-CM | POA: Diagnosis not present

## 2021-02-10 DIAGNOSIS — M545 Low back pain, unspecified: Secondary | ICD-10-CM | POA: Diagnosis not present

## 2021-02-16 DIAGNOSIS — U071 COVID-19: Secondary | ICD-10-CM | POA: Diagnosis not present

## 2021-02-16 DIAGNOSIS — Z20822 Contact with and (suspected) exposure to covid-19: Secondary | ICD-10-CM | POA: Diagnosis not present

## 2021-02-17 ENCOUNTER — Telehealth: Payer: Self-pay | Admitting: Family Medicine

## 2021-02-17 NOTE — Telephone Encounter (Signed)
Patient states he has Covid and he doesn't feel well and he would like something called in. He states that he is congested, fever, cough, hurts all over.   CB# [phone]

## 2021-02-17 NOTE — Telephone Encounter (Signed)
Informed patient to call urgent care or virtual via mychart for an e- visit . No appts available, pt verbalized understanding.

## 2021-02-17 NOTE — Telephone Encounter (Signed)
[facility] urgent [facility] contacted office and left voicemail. Returned call to [facility]. Pt was seen yesterday and tested positive for COVID. I spoke with provider there and he states he does not have ability to do STAT renal labs and pt is on a plethora of meds. Provider informed patient to reach out to PCP to see if he can be seen here or if we can get labs done. Pt was also informed by [facility] regarding infusion therapy. Pt is going to call [facility] regarding infusion therapy. Please advise. Thank you

## 2021-02-19 NOTE — Telephone Encounter (Signed)
Pt contacted. Pt states that he has ran through the course now and is feeling fine.

## 2021-02-25 ENCOUNTER — Telehealth: Payer: Self-pay | Admitting: Family Medicine

## 2021-02-25 DIAGNOSIS — M1990 Unspecified osteoarthritis, unspecified site: Secondary | ICD-10-CM | POA: Diagnosis not present

## 2021-02-25 DIAGNOSIS — M5136 Other intervertebral disc degeneration, lumbar region: Secondary | ICD-10-CM | POA: Diagnosis not present

## 2021-02-25 DIAGNOSIS — G894 Chronic pain syndrome: Secondary | ICD-10-CM | POA: Diagnosis not present

## 2021-02-25 DIAGNOSIS — M545 Low back pain, unspecified: Secondary | ICD-10-CM | POA: Diagnosis not present

## 2021-02-25 NOTE — Telephone Encounter (Signed)
4:10 PM on Friday is the soonest that I could work him in

## 2021-02-25 NOTE — Telephone Encounter (Signed)
Patient had Covid on 7/18 and needing clearance from primary care stating his lungs are clear so he can get his pain medication from pain management. He states no symptoms. Feeling much better. Please advise where to put on schedule

## 2021-02-25 NOTE — Telephone Encounter (Signed)
Please advise. Thank you

## 2021-02-26 NOTE — Telephone Encounter (Signed)
Tried calling patient, voicemail not available

## 2021-02-27 ENCOUNTER — Other Ambulatory Visit: Payer: Self-pay

## 2021-02-27 ENCOUNTER — Ambulatory Visit (INDEPENDENT_AMBULATORY_CARE_PROVIDER_SITE_OTHER): Payer: Medicare Other | Admitting: Family Medicine

## 2021-02-27 ENCOUNTER — Ambulatory Visit: Payer: Medicare Other | Admitting: Family Medicine

## 2021-02-27 ENCOUNTER — Encounter: Payer: Self-pay | Admitting: Family Medicine

## 2021-02-27 VITALS — BP 122/78 | Temp 97.9°F | Wt 211.6 lb

## 2021-02-27 DIAGNOSIS — Z8616 Personal history of COVID-19: Secondary | ICD-10-CM | POA: Diagnosis not present

## 2021-02-27 DIAGNOSIS — U071 COVID-19: Secondary | ICD-10-CM

## 2021-02-27 NOTE — Progress Notes (Signed)
   Subjective:    Patient ID: Jim [name], male    DOB: Jan 22, 1956, 65 y.o.   MRN: [id number]  HPI Pt had COVID week before last and is needing to be cleared by PCP before pain management will give pain meds. Pt feeling good for the past few days. Pain management needs PCP to say his lungs are clear.   Patient with recent COVID infection overall he feels very good currently no shortness of breath or chest tightness pressure pain no wheezing or difficulty breathing states coughing is resolved Review of Systems     Objective:   Physical Exam  Lungs clear respiratory rate normal heart is regular pulses normal extremities no edema skin warm dry      Assessment & Plan:  COVID infection This is cleared up No sign of any ongoing troubles Follow-up if any problems He was given a note stating that he was doing well he will talk with his pain management individuals

## 2021-02-28 ENCOUNTER — Other Ambulatory Visit: Payer: Self-pay | Admitting: Family Medicine

## 2021-03-12 DIAGNOSIS — G894 Chronic pain syndrome: Secondary | ICD-10-CM | POA: Diagnosis not present

## 2021-03-24 DIAGNOSIS — M5136 Other intervertebral disc degeneration, lumbar region: Secondary | ICD-10-CM | POA: Diagnosis not present

## 2021-03-24 DIAGNOSIS — Z79891 Long term (current) use of opiate analgesic: Secondary | ICD-10-CM | POA: Diagnosis not present

## 2021-03-24 DIAGNOSIS — M1990 Unspecified osteoarthritis, unspecified site: Secondary | ICD-10-CM | POA: Diagnosis not present

## 2021-03-24 DIAGNOSIS — G894 Chronic pain syndrome: Secondary | ICD-10-CM | POA: Diagnosis not present

## 2021-04-11 DIAGNOSIS — G894 Chronic pain syndrome: Secondary | ICD-10-CM | POA: Diagnosis not present

## 2021-04-11 DIAGNOSIS — M545 Low back pain, unspecified: Secondary | ICD-10-CM | POA: Diagnosis not present

## 2021-04-12 ENCOUNTER — Other Ambulatory Visit: Payer: Self-pay | Admitting: Family Medicine

## 2021-04-22 ENCOUNTER — Telehealth: Payer: Self-pay | Admitting: Family Medicine

## 2021-04-22 DIAGNOSIS — E1140 Type 2 diabetes mellitus with diabetic neuropathy, unspecified: Secondary | ICD-10-CM

## 2021-04-22 DIAGNOSIS — Z79899 Other long term (current) drug therapy: Secondary | ICD-10-CM

## 2021-04-22 DIAGNOSIS — E785 Hyperlipidemia, unspecified: Secondary | ICD-10-CM

## 2021-04-22 DIAGNOSIS — E1169 Type 2 diabetes mellitus with other specified complication: Secondary | ICD-10-CM

## 2021-04-22 DIAGNOSIS — Z8619 Personal history of other infectious and parasitic diseases: Secondary | ICD-10-CM | POA: Diagnosis not present

## 2021-04-22 DIAGNOSIS — L089 Local infection of the skin and subcutaneous tissue, unspecified: Secondary | ICD-10-CM | POA: Diagnosis not present

## 2021-04-22 DIAGNOSIS — Z125 Encounter for screening for malignant neoplasm of prostate: Secondary | ICD-10-CM

## 2021-04-22 DIAGNOSIS — Z0000 Encounter for general adult medical examination without abnormal findings: Secondary | ICD-10-CM

## 2021-04-22 DIAGNOSIS — I10 Essential (primary) hypertension: Secondary | ICD-10-CM

## 2021-04-22 NOTE — Telephone Encounter (Signed)
Patient requesting labs for physical 10/26

## 2021-04-24 NOTE — Telephone Encounter (Signed)
Patient is aware that lab orders have been placed and that he can go get labs

## 2021-04-24 NOTE — Telephone Encounter (Signed)
A7W, metabolic 7, lipid, liver, CBC, PSA, urine ACR Diabetes hypertension hyperlipidemia screening, wellness

## 2021-04-24 NOTE — Telephone Encounter (Signed)
Lab orders placed. Left message to return call  

## 2021-04-28 DIAGNOSIS — G894 Chronic pain syndrome: Secondary | ICD-10-CM | POA: Diagnosis not present

## 2021-04-28 DIAGNOSIS — M545 Low back pain, unspecified: Secondary | ICD-10-CM | POA: Diagnosis not present

## 2021-04-28 DIAGNOSIS — M5136 Other intervertebral disc degeneration, lumbar region: Secondary | ICD-10-CM | POA: Diagnosis not present

## 2021-04-28 DIAGNOSIS — M169 Osteoarthritis of hip, unspecified: Secondary | ICD-10-CM | POA: Diagnosis not present

## 2021-05-03 ENCOUNTER — Other Ambulatory Visit: Payer: Self-pay | Admitting: Family Medicine

## 2021-05-06 DIAGNOSIS — Z9181 History of falling: Secondary | ICD-10-CM | POA: Diagnosis not present

## 2021-05-06 DIAGNOSIS — M62838 Other muscle spasm: Secondary | ICD-10-CM | POA: Diagnosis not present

## 2021-05-11 DIAGNOSIS — G894 Chronic pain syndrome: Secondary | ICD-10-CM | POA: Diagnosis not present

## 2021-05-11 DIAGNOSIS — M545 Low back pain, unspecified: Secondary | ICD-10-CM | POA: Diagnosis not present

## 2021-05-11 DIAGNOSIS — M5136 Other intervertebral disc degeneration, lumbar region: Secondary | ICD-10-CM | POA: Diagnosis not present

## 2021-05-13 ENCOUNTER — Other Ambulatory Visit: Payer: Self-pay | Admitting: Family Medicine

## 2021-05-14 ENCOUNTER — Other Ambulatory Visit: Payer: Self-pay | Admitting: Family Medicine

## 2021-05-20 DIAGNOSIS — H905 Unspecified sensorineural hearing loss: Secondary | ICD-10-CM | POA: Diagnosis not present

## 2021-05-22 ENCOUNTER — Other Ambulatory Visit: Payer: Self-pay | Admitting: Family Medicine

## 2021-05-24 ENCOUNTER — Other Ambulatory Visit: Payer: Self-pay | Admitting: Family Medicine

## 2021-05-25 DIAGNOSIS — I10 Essential (primary) hypertension: Secondary | ICD-10-CM | POA: Diagnosis not present

## 2021-05-25 DIAGNOSIS — Z79899 Other long term (current) drug therapy: Secondary | ICD-10-CM | POA: Diagnosis not present

## 2021-05-25 DIAGNOSIS — E1169 Type 2 diabetes mellitus with other specified complication: Secondary | ICD-10-CM | POA: Diagnosis not present

## 2021-05-25 DIAGNOSIS — E785 Hyperlipidemia, unspecified: Secondary | ICD-10-CM | POA: Diagnosis not present

## 2021-05-25 DIAGNOSIS — E1140 Type 2 diabetes mellitus with diabetic neuropathy, unspecified: Secondary | ICD-10-CM | POA: Diagnosis not present

## 2021-05-26 DIAGNOSIS — M169 Osteoarthritis of hip, unspecified: Secondary | ICD-10-CM | POA: Diagnosis not present

## 2021-05-26 DIAGNOSIS — G894 Chronic pain syndrome: Secondary | ICD-10-CM | POA: Diagnosis not present

## 2021-05-26 DIAGNOSIS — M1990 Unspecified osteoarthritis, unspecified site: Secondary | ICD-10-CM | POA: Diagnosis not present

## 2021-05-26 DIAGNOSIS — Z79891 Long term (current) use of opiate analgesic: Secondary | ICD-10-CM | POA: Diagnosis not present

## 2021-05-26 LAB — CBC WITH DIFFERENTIAL/PLATELET
Basophils Absolute: 0 10*3/uL (ref 0.0–0.2)
Basos: 1 %
EOS (ABSOLUTE): 0.2 10*3/uL (ref 0.0–0.4)
Eos: 2 %
Hematocrit: 46.3 % (ref 37.5–51.0)
Hemoglobin: 15.2 g/dL (ref 13.0–17.7)
Immature Grans (Abs): 0 10*3/uL (ref 0.0–0.1)
Immature Granulocytes: 0 %
Lymphocytes Absolute: 1.5 10*3/uL (ref 0.7–3.1)
Lymphs: 21 %
MCH: 30.9 pg (ref 26.6–33.0)
MCHC: 32.8 g/dL (ref 31.5–35.7)
MCV: 94 fL (ref 79–97)
Monocytes Absolute: 0.6 10*3/uL (ref 0.1–0.9)
Monocytes: 9 %
Neutrophils Absolute: 5 10*3/uL (ref 1.4–7.0)
Neutrophils: 67 %
Platelets: 128 10*3/uL — ABNORMAL LOW (ref 150–450)
RBC: 4.92 x10E6/uL (ref 4.14–5.80)
RDW: 13.5 % (ref 11.6–15.4)
WBC: 7.3 10*3/uL (ref 3.4–10.8)

## 2021-05-26 LAB — LIPID PANEL
Chol/HDL Ratio: 5.5 ratio — ABNORMAL HIGH (ref 0.0–5.0)
Cholesterol, Total: 215 mg/dL — ABNORMAL HIGH (ref 100–199)
HDL: 39 mg/dL — ABNORMAL LOW (ref >39)
LDL Chol Calc (NIH): 108 mg/dL — ABNORMAL HIGH (ref 0–99)
Triglycerides: 395 mg/dL — ABNORMAL HIGH (ref 0–149)
VLDL Cholesterol Cal: 68 mg/dL — ABNORMAL HIGH (ref 5–40)

## 2021-05-26 LAB — BASIC METABOLIC PANEL
BUN/Creatinine Ratio: 14 (ref 10–24)
BUN: 15 mg/dL (ref 8–27)
CO2: 19 mmol/L — ABNORMAL LOW (ref 20–29)
Calcium: 9.5 mg/dL (ref 8.6–10.2)
Chloride: 102 mmol/L (ref 96–106)
Creatinine, Ser: 1.07 mg/dL (ref 0.76–1.27)
Glucose: 219 mg/dL — ABNORMAL HIGH (ref 70–99)
Potassium: 4.5 mmol/L (ref 3.5–5.2)
Sodium: 137 mmol/L (ref 134–144)
eGFR: 77 mL/min/{1.73_m2} (ref >59)

## 2021-05-26 LAB — MICROALBUMIN / CREATININE URINE RATIO
Creatinine, Urine: 109 mg/dL
Microalb/Creat Ratio: 16 mg/g creat (ref 0–29)
Microalbumin, Urine: 17.5 ug/mL

## 2021-05-26 LAB — HEPATIC FUNCTION PANEL
ALT: 59 IU/L — ABNORMAL HIGH (ref 0–44)
AST: 36 IU/L (ref 0–40)
Albumin: 4.3 g/dL (ref 3.8–4.8)
Alkaline Phosphatase: 145 IU/L — ABNORMAL HIGH (ref 44–121)
Bilirubin Total: 0.3 mg/dL (ref 0.0–1.2)
Bilirubin, Direct: 0.14 mg/dL (ref 0.00–0.40)
Total Protein: 6.8 g/dL (ref 6.0–8.5)

## 2021-05-26 LAB — HEMOGLOBIN A1C
Est. average glucose Bld gHb Est-mCnc: 166 mg/dL
Hgb A1c MFr Bld: 7.4 % — ABNORMAL HIGH (ref 4.8–5.6)

## 2021-05-26 LAB — PSA: Prostate Specific Ag, Serum: 1.1 ng/mL (ref 0.0–4.0)

## 2021-05-27 ENCOUNTER — Ambulatory Visit (INDEPENDENT_AMBULATORY_CARE_PROVIDER_SITE_OTHER): Payer: Medicare Other | Admitting: Family Medicine

## 2021-05-27 ENCOUNTER — Other Ambulatory Visit: Payer: Self-pay

## 2021-05-27 ENCOUNTER — Encounter: Payer: Self-pay | Admitting: Family Medicine

## 2021-05-27 VITALS — BP 132/76 | Temp 98.4°F | Ht 66.0 in | Wt 220.2 lb

## 2021-05-27 DIAGNOSIS — E785 Hyperlipidemia, unspecified: Secondary | ICD-10-CM

## 2021-05-27 DIAGNOSIS — M25552 Pain in left hip: Secondary | ICD-10-CM | POA: Diagnosis not present

## 2021-05-27 DIAGNOSIS — I10 Essential (primary) hypertension: Secondary | ICD-10-CM | POA: Diagnosis not present

## 2021-05-27 DIAGNOSIS — G894 Chronic pain syndrome: Secondary | ICD-10-CM

## 2021-05-27 DIAGNOSIS — Z23 Encounter for immunization: Secondary | ICD-10-CM | POA: Diagnosis not present

## 2021-05-27 DIAGNOSIS — E1140 Type 2 diabetes mellitus with diabetic neuropathy, unspecified: Secondary | ICD-10-CM

## 2021-05-27 DIAGNOSIS — Z0000 Encounter for general adult medical examination without abnormal findings: Secondary | ICD-10-CM

## 2021-05-27 DIAGNOSIS — E1169 Type 2 diabetes mellitus with other specified complication: Secondary | ICD-10-CM | POA: Diagnosis not present

## 2021-05-27 DIAGNOSIS — G4700 Insomnia, unspecified: Secondary | ICD-10-CM

## 2021-05-27 MED ORDER — FLUOXETINE HCL 20 MG PO CAPS: 60.0000 mg | ORAL_CAPSULE | Freq: Every day | ORAL | 1 refills | Status: DC

## 2021-05-27 MED ORDER — TRAZODONE HCL 50 MG PO TABS: ORAL_TABLET | ORAL | 1 refills | Status: DC

## 2021-05-27 MED ORDER — EZETIMIBE 10 MG PO TABS: 10.0000 mg | ORAL_TABLET | Freq: Every day | ORAL | 1 refills | Status: DC

## 2021-05-27 MED ORDER — EMPAGLIFLOZIN 10 MG PO TABS: 10.0000 mg | ORAL_TABLET | Freq: Every day | ORAL | 5 refills | Status: DC

## 2021-05-27 MED ORDER — GLIPIZIDE ER 2.5 MG PO TB24: ORAL_TABLET | ORAL | 1 refills | Status: DC

## 2021-05-27 MED ORDER — METFORMIN HCL 500 MG PO TABS: ORAL_TABLET | ORAL | 1 refills | Status: DC

## 2021-05-27 MED ORDER — ROSUVASTATIN CALCIUM 5 MG PO TABS: ORAL_TABLET | ORAL | 3 refills | Status: DC

## 2021-05-27 NOTE — Patient Instructions (Addendum)
Results for orders placed or performed in visit on 04/22/21  Hemoglobin A1c  Result Value Ref Range   Hgb A1c MFr Bld 7.4 (H) 4.8 - 5.6 %   Est. average glucose Bld gHb Est-mCnc 166 mg/dL  Basic Metabolic Panel (BMET)  Result Value Ref Range   Glucose 219 (H) 70 - 99 mg/dL   BUN 15 8 - 27 mg/dL   Creatinine, Ser 1.07 0.76 - 1.27 mg/dL   eGFR 77 >59 mL/min/1.73   BUN/Creatinine Ratio 14 10 - 24   Sodium 137 134 - 144 mmol/L   Potassium 4.5 3.5 - 5.2 mmol/L   Chloride 102 96 - 106 mmol/L   CO2 19 (L) 20 - 29 mmol/L   Calcium 9.5 8.6 - 10.2 mg/dL  Lipid Profile  Result Value Ref Range   Cholesterol, Total 215 (H) 100 - 199 mg/dL   Triglycerides 395 (H) 0 - 149 mg/dL   HDL 39 (L) >39 mg/dL   VLDL Cholesterol Cal 68 (H) 5 - 40 mg/dL   LDL Chol Calc (NIH) 108 (H) 0 - 99 mg/dL   Chol/HDL Ratio 5.5 (H) 0.0 - 5.0 ratio  Hepatic function panel  Result Value Ref Range   Total Protein 6.8 6.0 - 8.5 g/dL   Albumin 4.3 3.8 - 4.8 g/dL   Bilirubin Total 0.3 0.0 - 1.2 mg/dL   Bilirubin, Direct 0.14 0.00 - 0.40 mg/dL   Alkaline Phosphatase 145 (H) 44 - 121 IU/L   AST 36 0 - 40 IU/L   ALT 59 (H) 0 - 44 IU/L  CBC with Differential  Result Value Ref Range   WBC 7.3 3.4 - 10.8 x10E3/uL   RBC 4.92 4.14 - 5.80 x10E6/uL   Hemoglobin 15.2 13.0 - 17.7 g/dL   Hematocrit 46.3 37.5 - 51.0 %   MCV 94 79 - 97 fL   MCH 30.9 26.6 - 33.0 pg   MCHC 32.8 31.5 - 35.7 g/dL   RDW 13.5 11.6 - 15.4 %   Platelets 128 (L) 150 - 450 x10E3/uL   Neutrophils 67 Not Estab. %   Lymphs 21 Not Estab. %   Monocytes 9 Not Estab. %   Eos 2 Not Estab. %   Basos 1 Not Estab. %   Neutrophils Absolute 5.0 1.4 - 7.0 x10E3/uL   Lymphocytes Absolute 1.5 0.7 - 3.1 x10E3/uL   Monocytes Absolute 0.6 0.1 - 0.9 x10E3/uL   EOS (ABSOLUTE) 0.2 0.0 - 0.4 x10E3/uL   Basophils Absolute 0.0 0.0 - 0.2 x10E3/uL   Immature Granulocytes 0 Not Estab. %   Immature Grans (Abs) 0.0 0.0 - 0.1 x10E3/uL  PSA  Result Value Ref Range    Prostate Specific Ag, Serum 1.1 0.0 - 4.0 ng/mL  Urine Microalbumin w/creat. ratio  Result Value Ref Range   Creatinine, Urine 109.0 Not Estab. mg/dL   Microalbumin, Urine 17.5 Not Estab. ug/mL   Microalb/Creat Ratio 16 0 - 29 mg/g creat   Stop Januvia ( sitagliptin) and start Jardiance 10 mg once a day  Monitor sugars if low then call us (may need to stop glipizide) If ever sick with stomach bug then do not take Jardiance while having vomiting

## 2021-05-27 NOTE — Progress Notes (Signed)
   Subjective:    Patient ID: Jim [name], male    DOB: Dec 24, 1955, 65 y.o.   MRN: [id number]  HPI AWV- Annual Wellness Visit  The patient was seen for their annual wellness visit. The patient's past medical history, surgical history, and family history were reviewed. Pertinent vaccines were reviewed ( tetanus, pneumonia, shingles, flu) The patient's medication list was reviewed and updated.  The height and weight were entered.  BMI recorded in electronic record elsewhere  Cognitive screening was completed. Outcome of Mini - Cog: pass   Falls /depression screening electronically recorded within record elsewhere  Current tobacco usage: none (All patients who use tobacco were given written and verbal information on quitting)  Recent listing of emergency department/hospitalizations over the past year were reviewed.  current specialist the patient sees on a regular basis: pain management    Medicare annual wellness visit patient questionnaire was reviewed.  A written screening schedule for the patient for the next 5-10 years was given. Appropriate discussion of followup regarding next visit was discussed.  Patient has underlying diabetes.  Taking his medicines Denies low sugar spells Cannot tolerate statins Does take his Zetia    Review of Systems     Objective:   Physical Exam  General-in no acute distress Eyes-no discharge Lungs-respiratory rate normal, CTA CV-no murmurs,RRR Extremities skin warm dry no edema Neuro grossly normal Behavior normal, alert       Assessment & Plan:  1. Need for vaccination Flu shot today - Flu Vaccine QUAD High Dose(Fluad)  2. Encounter for subsequent annual wellness visit (AWV) in Medicare patient Adult wellness-complete.wellness physical was conducted today. Importance of diet and exercise were discussed in detail.  In addition to this a discussion regarding safety was also covered. We also reviewed over immunizations and  gave recommendations regarding current immunization needed for age.  In addition to this additional areas were also touched on including: Preventative health exams needed:  Colonoscopy 2023  Patient was advised yearly wellness exam   3. Left hip pain X-ray.  He states pain medicine doctor wanted the MRI of his hip we will start with an x-ray possibly referral to orthopedics for injection - DG HIP UNILAT WITH PELVIS 2-3 VIEWS LEFT  4. Type 2 diabetes mellitus with diabetic neuropathy, without long-term current use of insulin ([place]) Subpar control we did discuss various measures.  We will try Jardiance in the place of Januvia.  Continue metformin as is.  Check labs in 3 to 4 months with follow-up visit - Basic Metabolic Panel (BMET) - Hemoglobin A1c  5. Hyperlipidemia associated with type 2 diabetes mellitus ([place]) Does not tolerate statins continue current measures. - Basic Metabolic Panel (BMET) - Hemoglobin A1c  6. HTN (hypertension), benign Blood pressure good control - Basic Metabolic Panel (BMET) - Hemoglobin A1c  7. Chronic pain syndrome Follows with pain management  8. Insomnia, unspecified type Trazodone does well for this  Patient has history of claudication in the leg doing well currently

## 2021-06-06 DIAGNOSIS — Z20822 Contact with and (suspected) exposure to covid-19: Secondary | ICD-10-CM | POA: Diagnosis not present

## 2021-06-06 DIAGNOSIS — J069 Acute upper respiratory infection, unspecified: Secondary | ICD-10-CM | POA: Diagnosis not present

## 2021-06-06 DIAGNOSIS — M791 Myalgia, unspecified site: Secondary | ICD-10-CM | POA: Diagnosis not present

## 2021-06-06 DIAGNOSIS — L249 Irritant contact dermatitis, unspecified cause: Secondary | ICD-10-CM | POA: Diagnosis not present

## 2021-06-10 DIAGNOSIS — G894 Chronic pain syndrome: Secondary | ICD-10-CM | POA: Diagnosis not present

## 2021-06-10 DIAGNOSIS — M1990 Unspecified osteoarthritis, unspecified site: Secondary | ICD-10-CM | POA: Diagnosis not present

## 2021-06-10 DIAGNOSIS — Z79891 Long term (current) use of opiate analgesic: Secondary | ICD-10-CM | POA: Diagnosis not present

## 2021-06-10 DIAGNOSIS — M169 Osteoarthritis of hip, unspecified: Secondary | ICD-10-CM | POA: Diagnosis not present

## 2021-06-16 ENCOUNTER — Other Ambulatory Visit: Payer: Self-pay

## 2021-06-16 ENCOUNTER — Ambulatory Visit (HOSPITAL_COMMUNITY)
Admission: RE | Admit: 2021-06-16 | Discharge: 2021-06-16 | Disposition: A | Discharge status: Home / Self Care | Payer: Medicare Other | Source: Ambulatory Visit | Attending: Family Medicine | Admitting: Family Medicine

## 2021-06-16 DIAGNOSIS — M25552 Pain in left hip: Secondary | ICD-10-CM | POA: Insufficient documentation

## 2021-06-29 DIAGNOSIS — M5136 Other intervertebral disc degeneration, lumbar region: Secondary | ICD-10-CM | POA: Diagnosis not present

## 2021-06-29 DIAGNOSIS — G894 Chronic pain syndrome: Secondary | ICD-10-CM | POA: Diagnosis not present

## 2021-06-29 DIAGNOSIS — M1990 Unspecified osteoarthritis, unspecified site: Secondary | ICD-10-CM | POA: Diagnosis not present

## 2021-07-10 DIAGNOSIS — G894 Chronic pain syndrome: Secondary | ICD-10-CM | POA: Diagnosis not present

## 2021-07-22 ENCOUNTER — Other Ambulatory Visit: Payer: Self-pay | Admitting: Family Medicine

## 2021-07-22 DIAGNOSIS — J069 Acute upper respiratory infection, unspecified: Secondary | ICD-10-CM | POA: Diagnosis not present

## 2021-07-22 DIAGNOSIS — M791 Myalgia, unspecified site: Secondary | ICD-10-CM | POA: Diagnosis not present

## 2021-07-29 ENCOUNTER — Telehealth: Payer: Self-pay | Admitting: Family Medicine

## 2021-07-29 DIAGNOSIS — G894 Chronic pain syndrome: Secondary | ICD-10-CM | POA: Diagnosis not present

## 2021-07-29 NOTE — Telephone Encounter (Signed)
Pt called and wanted a recommendation for a rheumatologist as his pain clinic wants him to see one. [phone].

## 2021-07-29 NOTE — Telephone Encounter (Signed)
Dr. Garen Grams, dr Amil Amen, [facility] as well

## 2021-07-29 NOTE — Telephone Encounter (Signed)
Pt contacted. Looked up rheumatologist and location so patient could be as close to [place] [employer] (pt did not want to go far into [place]). Pt states he will call Dr.Beekman; gave address and phone number. Pt verbalized understanding.

## 2021-07-31 DIAGNOSIS — M7061 Trochanteric bursitis, right hip: Secondary | ICD-10-CM | POA: Insufficient documentation

## 2021-07-31 DIAGNOSIS — M48062 Spinal stenosis, lumbar region with neurogenic claudication: Secondary | ICD-10-CM | POA: Diagnosis not present

## 2021-07-31 DIAGNOSIS — M7062 Trochanteric bursitis, left hip: Secondary | ICD-10-CM | POA: Diagnosis not present

## 2021-07-31 DIAGNOSIS — M47816 Spondylosis without myelopathy or radiculopathy, lumbar region: Secondary | ICD-10-CM | POA: Diagnosis not present

## 2021-07-31 DIAGNOSIS — M5416 Radiculopathy, lumbar region: Secondary | ICD-10-CM | POA: Diagnosis not present

## 2021-07-31 DIAGNOSIS — M7918 Myalgia, other site: Secondary | ICD-10-CM | POA: Diagnosis not present

## 2021-08-09 DIAGNOSIS — G894 Chronic pain syndrome: Secondary | ICD-10-CM | POA: Diagnosis not present

## 2021-08-11 ENCOUNTER — Other Ambulatory Visit: Payer: Self-pay

## 2021-08-11 ENCOUNTER — Ambulatory Visit (HOSPITAL_COMMUNITY)
Admission: RE | Admit: 2021-08-11 | Discharge: 2021-08-11 | Disposition: A | Discharge status: Home / Self Care | Payer: Medicare Other | Source: Ambulatory Visit | Attending: Family Medicine | Admitting: Family Medicine

## 2021-08-11 ENCOUNTER — Ambulatory Visit (INDEPENDENT_AMBULATORY_CARE_PROVIDER_SITE_OTHER): Payer: Medicare Other | Admitting: Family Medicine

## 2021-08-11 VITALS — BP 114/71 | Ht 66.0 in | Wt 232.4 lb

## 2021-08-11 DIAGNOSIS — M25512 Pain in left shoulder: Secondary | ICD-10-CM | POA: Insufficient documentation

## 2021-08-11 DIAGNOSIS — M19012 Primary osteoarthritis, left shoulder: Secondary | ICD-10-CM | POA: Diagnosis not present

## 2021-08-11 DIAGNOSIS — M79622 Pain in left upper arm: Secondary | ICD-10-CM

## 2021-08-11 NOTE — Progress Notes (Signed)
° °  Subjective:    Patient ID: Jim [name], male    DOB: 07-16-1956, 66 y.o.   MRN: [id number]  HPI  Patient arrives with left shoulder pain s/p fall a few weeks ago. Patient with significant left shoulder pain discomfort.  Occurred after a fall.  Jammed his arm which which in turn jammed his shoulder cause pain discomfort limited range of motion Patient already on opioids for pain Review of Systems     Objective:   Physical Exam Limited range of motion of left shoulder with difficulty with external rotation and putting his arm behind his back unable to lift the above shoulder level tenderness and pain in the shoulder       Assessment & Plan:  Shoulder injury Possible rotator cuff strain or tear Referral to Dr. Archie Endo group X-ray ordered Await the results Already has pain medicine May supplement with Tylenol if necessary

## 2021-08-18 DIAGNOSIS — S46012A Strain of muscle(s) and tendon(s) of the rotator cuff of left shoulder, initial encounter: Secondary | ICD-10-CM | POA: Diagnosis not present

## 2021-08-25 DIAGNOSIS — M545 Low back pain, unspecified: Secondary | ICD-10-CM | POA: Diagnosis not present

## 2021-08-25 DIAGNOSIS — M5136 Other intervertebral disc degeneration, lumbar region: Secondary | ICD-10-CM | POA: Diagnosis not present

## 2021-08-25 DIAGNOSIS — G894 Chronic pain syndrome: Secondary | ICD-10-CM | POA: Diagnosis not present

## 2021-08-25 DIAGNOSIS — M1990 Unspecified osteoarthritis, unspecified site: Secondary | ICD-10-CM | POA: Diagnosis not present

## 2021-09-22 ENCOUNTER — Telehealth: Payer: Self-pay | Admitting: Family Medicine

## 2021-09-22 DIAGNOSIS — G8929 Other chronic pain: Secondary | ICD-10-CM | POA: Diagnosis not present

## 2021-09-22 DIAGNOSIS — G894 Chronic pain syndrome: Secondary | ICD-10-CM | POA: Diagnosis not present

## 2021-09-22 DIAGNOSIS — M5136 Other intervertebral disc degeneration, lumbar region: Secondary | ICD-10-CM | POA: Diagnosis not present

## 2021-09-22 DIAGNOSIS — M545 Low back pain, unspecified: Secondary | ICD-10-CM | POA: Diagnosis not present

## 2021-09-22 NOTE — Telephone Encounter (Signed)
Although the freestyle Elenor Legato is a excellent way to monitor glucoses-insurance products including Medicare will only cover if on intensive insulin injections

## 2021-09-22 NOTE — Telephone Encounter (Signed)
Patient would like a prescription for Free style Jim [name] called into  [facility]

## 2021-09-22 NOTE — Telephone Encounter (Signed)
Left message to return call 

## 2021-09-23 ENCOUNTER — Other Ambulatory Visit: Payer: Self-pay | Admitting: *Deleted

## 2021-09-23 DIAGNOSIS — E1140 Type 2 diabetes mellitus with diabetic neuropathy, unspecified: Secondary | ICD-10-CM

## 2021-09-23 MED ORDER — FREESTYLE LIBRE 2 SENSOR MISC: 0 refills | Status: DC

## 2021-09-23 MED ORDER — FREESTYLE LIBRE 2 READER DEVI: 0 refills | Status: DC

## 2021-09-23 NOTE — Telephone Encounter (Signed)
1.  The patient needs to check his sugars at least 1 time a day and record these on readings and bring them with him when he comes for his office visit on the 27th We will discuss this further when he follows up on the 27th (I am willing to send in a request for this but I believe that we will be exercise in futility because I do not believe the insurance company will approve it even with a prior authorization)

## 2021-09-23 NOTE — Telephone Encounter (Signed)
Pt returned call. Pt informed that he has to be on multiple insulin shots per day Pt states that his cousin got this yesterday and he is not on insulin and also has Medicaid. Informed pt that if PCP agrees to send in we will have to complete a PA. Pt verbalized understanding. Please advise. Thank you.

## 2021-09-23 NOTE — Telephone Encounter (Signed)
May go ahead and send this Freestyle libre Type 2 diabetes Fluctuating glucoses (I expect that this will be denied by the insurance carrier we can do a prior approval once it is denied there is a good chance it would still be denied but nonetheless is a process we will do)

## 2021-09-23 NOTE — Telephone Encounter (Signed)
Patient informed that prescriptions were sent. ?

## 2021-09-27 ENCOUNTER — Other Ambulatory Visit: Payer: Self-pay | Admitting: Family Medicine

## 2021-09-27 DIAGNOSIS — G894 Chronic pain syndrome: Secondary | ICD-10-CM | POA: Diagnosis not present

## 2021-09-28 ENCOUNTER — Ambulatory Visit: Payer: Medicare Other | Admitting: Family Medicine

## 2021-09-29 DIAGNOSIS — S46012D Strain of muscle(s) and tendon(s) of the rotator cuff of left shoulder, subsequent encounter: Secondary | ICD-10-CM | POA: Diagnosis not present

## 2021-09-29 DIAGNOSIS — G894 Chronic pain syndrome: Secondary | ICD-10-CM | POA: Diagnosis not present

## 2021-10-01 DIAGNOSIS — H2513 Age-related nuclear cataract, bilateral: Secondary | ICD-10-CM | POA: Diagnosis not present

## 2021-10-01 DIAGNOSIS — E119 Type 2 diabetes mellitus without complications: Secondary | ICD-10-CM | POA: Diagnosis not present

## 2021-10-01 DIAGNOSIS — H52203 Unspecified astigmatism, bilateral: Secondary | ICD-10-CM | POA: Diagnosis not present

## 2021-10-01 LAB — HM DIABETES EYE EXAM

## 2021-10-19 DIAGNOSIS — E1140 Type 2 diabetes mellitus with diabetic neuropathy, unspecified: Secondary | ICD-10-CM | POA: Diagnosis not present

## 2021-10-19 DIAGNOSIS — E1169 Type 2 diabetes mellitus with other specified complication: Secondary | ICD-10-CM | POA: Diagnosis not present

## 2021-10-19 DIAGNOSIS — I10 Essential (primary) hypertension: Secondary | ICD-10-CM | POA: Diagnosis not present

## 2021-10-19 DIAGNOSIS — E785 Hyperlipidemia, unspecified: Secondary | ICD-10-CM | POA: Diagnosis not present

## 2021-10-20 ENCOUNTER — Other Ambulatory Visit: Payer: Self-pay

## 2021-10-20 ENCOUNTER — Ambulatory Visit (INDEPENDENT_AMBULATORY_CARE_PROVIDER_SITE_OTHER): Payer: Medicare Other | Admitting: Family Medicine

## 2021-10-20 ENCOUNTER — Encounter: Payer: Self-pay | Admitting: Family Medicine

## 2021-10-20 VITALS — BP 118/73 | HR 95 | Temp 98.7°F | Ht 66.0 in | Wt 218.0 lb

## 2021-10-20 DIAGNOSIS — E1140 Type 2 diabetes mellitus with diabetic neuropathy, unspecified: Secondary | ICD-10-CM | POA: Diagnosis not present

## 2021-10-20 DIAGNOSIS — I10 Essential (primary) hypertension: Secondary | ICD-10-CM

## 2021-10-20 DIAGNOSIS — E1169 Type 2 diabetes mellitus with other specified complication: Secondary | ICD-10-CM

## 2021-10-20 DIAGNOSIS — F324 Major depressive disorder, single episode, in partial remission: Secondary | ICD-10-CM

## 2021-10-20 DIAGNOSIS — E785 Hyperlipidemia, unspecified: Secondary | ICD-10-CM | POA: Diagnosis not present

## 2021-10-20 DIAGNOSIS — G4700 Insomnia, unspecified: Secondary | ICD-10-CM | POA: Diagnosis not present

## 2021-10-20 DIAGNOSIS — I739 Peripheral vascular disease, unspecified: Secondary | ICD-10-CM | POA: Diagnosis not present

## 2021-10-20 LAB — BASIC METABOLIC PANEL
BUN/Creatinine Ratio: 13 (ref 10–24)
BUN: 12 mg/dL (ref 8–27)
CO2: 24 mmol/L (ref 20–29)
Calcium: 9.9 mg/dL (ref 8.6–10.2)
Chloride: 103 mmol/L (ref 96–106)
Creatinine, Ser: 0.95 mg/dL (ref 0.76–1.27)
Glucose: 132 mg/dL — ABNORMAL HIGH (ref 70–99)
Potassium: 4.6 mmol/L (ref 3.5–5.2)
Sodium: 142 mmol/L (ref 134–144)
eGFR: 88 mL/min/{1.73_m2} (ref >59)

## 2021-10-20 LAB — HEMOGLOBIN A1C
Est. average glucose Bld gHb Est-mCnc: 169 mg/dL
Hgb A1c MFr Bld: 7.5 % — ABNORMAL HIGH (ref 4.8–5.6)

## 2021-10-20 MED ORDER — RYBELSUS 3 MG PO TABS: 3.0000 mg | ORAL_TABLET | Freq: Every day | ORAL | 0 refills | Status: DC

## 2021-10-20 MED ORDER — FLUOXETINE HCL 20 MG PO CAPS: 60.0000 mg | ORAL_CAPSULE | Freq: Every day | ORAL | 1 refills | Status: DC

## 2021-10-20 MED ORDER — BUPROPION HCL ER (XL) 150 MG PO TB24: 150.0000 mg | ORAL_TABLET | Freq: Every day | ORAL | 1 refills | Status: DC

## 2021-10-20 MED ORDER — CLOPIDOGREL BISULFATE 75 MG PO TABS: 75.0000 mg | ORAL_TABLET | Freq: Every day | ORAL | 1 refills | Status: DC

## 2021-10-20 MED ORDER — EMPAGLIFLOZIN 10 MG PO TABS: 10.0000 mg | ORAL_TABLET | Freq: Every day | ORAL | 5 refills | Status: DC

## 2021-10-20 MED ORDER — ROSUVASTATIN CALCIUM 5 MG PO TABS: ORAL_TABLET | ORAL | 3 refills | Status: DC

## 2021-10-20 MED ORDER — TRAZODONE HCL 50 MG PO TABS: ORAL_TABLET | ORAL | 1 refills | Status: DC

## 2021-10-20 MED ORDER — RYBELSUS 7 MG PO TABS: 7.0000 mg | ORAL_TABLET | Freq: Every day | ORAL | 2 refills | Status: DC

## 2021-10-20 NOTE — Progress Notes (Signed)
? ?Subjective:  ? ? Patient ID: Jim [name], male    DOB: 01/02/1956, 66 y.o.   MRN: [id number] ? ?Diabetes ?He presents for his follow-up diabetic visit. He has type 2 diabetes mellitus. Current diabetic treatments: metformin, jardiance.  ?Hyperlipidemia ?This is a chronic problem. Treatments tried: zetia, rosuvastatin.  ?Depression ?       This is a recurrent problem. ?Patient reports having mood swings taking fluoxetine  ?Results for orders placed or performed in visit on 05/27/21  ?Basic Metabolic Panel (BMET)  ?Result Value Ref Range  ? Glucose 132 (H) 70 - 99 mg/dL  ? BUN 12 8 - 27 mg/dL  ? Creatinine, Ser 0.95 0.76 - 1.27 mg/dL  ? eGFR 88 >59 mL/min/1.73  ? BUN/Creatinine Ratio 13 10 - 24  ? Sodium 142 134 - 144 mmol/L  ? Potassium 4.6 3.5 - 5.2 mmol/L  ? Chloride 103 96 - 106 mmol/L  ? CO2 24 20 - 29 mmol/L  ? Calcium 9.9 8.6 - 10.2 mg/dL  ?Hemoglobin A1c  ?Result Value Ref Range  ? Hgb A1c MFr Bld 7.5 (H) 4.8 - 5.6 %  ? Est. average glucose Bld gHb Est-mCnc 169 mg/dL  ? ?HTN (hypertension), benign - Plan: Lipid panel, Hepatic function panel, Hemoglobin A1c, Basic metabolic panel, Urine Microalbumin w/creat. ratio ? ?PVD (peripheral vascular disease) with claudication (HCC) - Plan: Lipid panel, Hepatic function panel, Hemoglobin A1c, Basic metabolic panel, Urine Microalbumin w/creat. ratio ? ?Type 2 diabetes mellitus with diabetic neuropathy, without long-term current use of insulin (HCC) - Plan: Lipid panel, Hepatic function panel, Hemoglobin A1c, Basic metabolic panel, Urine Microalbumin w/creat. ratio ? ?Hyperlipidemia associated with type 2 diabetes mellitus (HCC) - Plan: Lipid panel, Hepatic function panel, Hemoglobin A1c, Basic metabolic panel, Urine Microalbumin w/creat. ratio ? ?Insomnia, unspecified type - Plan: Lipid panel, Hepatic function panel, Hemoglobin A1c, Basic metabolic panel, Urine Microalbumin w/creat. ratio ? ?Depression, major, single episode, in partial remission (HCC),  Chronic ? ? ?Review of Systems  ?Psychiatric/Behavioral:  Positive for depression.   ? ?   ?Objective:  ? Physical Exam ? ?Lungs clear hearts regular pulse normal BP good extremities no edema ? ? ?   ?Assessment & Plan:  ?1. HTN (hypertension), benign ?Blood pressure good control continue current measures watch diet ?- Lipid panel ?- Hepatic function panel ?- Hemoglobin A1c ?- Basic metabolic panel ?- Urine Microalbumin w/creat. ratio ? ?2. PVD (peripheral vascular disease) with claudication (HCC) ?His legs do hurt with some walking but he stops when he can if any ongoing troubles or worsening issues back to vascular ?- Lipid panel ?- Hepatic function panel ?- Hemoglobin A1c ?- Basic metabolic panel ?- Urine Microalbumin w/creat. ratio ? ?3. Type 2 diabetes mellitus with diabetic neuropathy, without long-term current use of insulin (HCC) ?A1c not at goal we did discuss GLP-1 he does not want to do injections he does agree to Rybelsus side effects were discussed start off 3 mg daily after 1 month go to 7 mg daily stick with 7 mg call us if any severe abdominal pain vomiting or other problems occur stop medicine if it does occur recommend follow-up visit later in July or June ?- Lipid panel ?- Hepatic function panel ?- Hemoglobin A1c ?- Basic metabolic panel ?- Urine Microalbumin w/creat. ratio ? ?4. Hyperlipidemia associated with type 2 diabetes mellitus (HCC) ?Very important to get this under better control.  Continue current medication add Zetia daily ?- Lipid panel ?- Hepatic function panel ?- Hemoglobin A1c ?-   Basic metabolic panel ?- Urine Microalbumin w/creat. ratio ? ?5. Insomnia, unspecified type ?Uses trazodone at nighttime sleep hygiene discussed ?- Lipid panel ?- Hepatic function panel ?- Hemoglobin T6R ?- Basic metabolic panel ?- Urine Microalbumin w/creat. ratio ? ?6. Depression, major, single episode, in partial remission ([place]) ?Moods are doing fairly good for the most part but occasionally feels  depressed but not suicidal.  Feels the medicine is helping ? ? ?

## 2021-10-20 NOTE — Patient Instructions (Addendum)
Hi Saralyn Pilar ? ?It was good to see you today. ? ?Your A1c is 7.5.  The goal is to get your A1c around 7.0 or less.  I would recommend that you continue all your current medications.  If you are having any low sugar spells please let us know because that could indicate the need to adjust medications.  Also I am recommending that we start a medication called Rybelsus.  It can help lower your A1c by slowing down the intestine.  The starting dose is 3 mg once daily after the first 30 days then the next dose is 7 mg daily.  Most people tolerate the medicine well but a small number of people can have side effects.   ? ?It is not unusual to have some nausea or diarrhea initially with the medicine but over time that should settle down and go away.  If you are having severe nausea and vomiting or any other worrisome symptoms please let us know right away.  Finally if the medication causes significant abdominal pain or discomfort it is wise to notify us because that could be a sign that it is triggering what is called pancreatitis.  If you have this I recommend to be seen right away and stop the medication.  Most people do not have this issue. ? ?Please do blood work again in early July with a follow-up office visit in July. ?Please call us if any problems ?Thank you-Dr. Nicki Reaper ?

## 2021-10-21 ENCOUNTER — Ambulatory Visit: Payer: Medicare Other | Admitting: Family Medicine

## 2021-10-27 DIAGNOSIS — G894 Chronic pain syndrome: Secondary | ICD-10-CM | POA: Diagnosis not present

## 2021-10-28 DIAGNOSIS — R519 Headache, unspecified: Secondary | ICD-10-CM | POA: Diagnosis not present

## 2021-10-28 DIAGNOSIS — M5136 Other intervertebral disc degeneration, lumbar region: Secondary | ICD-10-CM | POA: Diagnosis not present

## 2021-10-28 DIAGNOSIS — R609 Edema, unspecified: Secondary | ICD-10-CM | POA: Diagnosis not present

## 2021-10-28 DIAGNOSIS — M47897 Other spondylosis, lumbosacral region: Secondary | ICD-10-CM | POA: Diagnosis not present

## 2021-10-28 DIAGNOSIS — G8929 Other chronic pain: Secondary | ICD-10-CM | POA: Diagnosis not present

## 2021-10-28 DIAGNOSIS — G5600 Carpal tunnel syndrome, unspecified upper limb: Secondary | ICD-10-CM | POA: Diagnosis not present

## 2021-10-28 DIAGNOSIS — M792 Neuralgia and neuritis, unspecified: Secondary | ICD-10-CM | POA: Diagnosis not present

## 2021-10-28 DIAGNOSIS — M79609 Pain in unspecified limb: Secondary | ICD-10-CM | POA: Diagnosis not present

## 2021-10-28 DIAGNOSIS — G894 Chronic pain syndrome: Secondary | ICD-10-CM | POA: Diagnosis not present

## 2021-10-28 DIAGNOSIS — M1990 Unspecified osteoarthritis, unspecified site: Secondary | ICD-10-CM | POA: Diagnosis not present

## 2021-10-28 DIAGNOSIS — M9951 Intervertebral disc stenosis of neural canal of cervical region: Secondary | ICD-10-CM | POA: Diagnosis not present

## 2021-10-28 DIAGNOSIS — M48062 Spinal stenosis, lumbar region with neurogenic claudication: Secondary | ICD-10-CM | POA: Diagnosis not present

## 2021-10-30 DIAGNOSIS — S46012D Strain of muscle(s) and tendon(s) of the rotator cuff of left shoulder, subsequent encounter: Secondary | ICD-10-CM | POA: Diagnosis not present

## 2021-11-05 DIAGNOSIS — E119 Type 2 diabetes mellitus without complications: Secondary | ICD-10-CM | POA: Diagnosis not present

## 2021-11-05 DIAGNOSIS — H2512 Age-related nuclear cataract, left eye: Secondary | ICD-10-CM | POA: Diagnosis not present

## 2021-11-17 ENCOUNTER — Other Ambulatory Visit: Payer: Self-pay | Admitting: Family Medicine

## 2021-11-18 DIAGNOSIS — G8929 Other chronic pain: Secondary | ICD-10-CM | POA: Diagnosis not present

## 2021-11-18 DIAGNOSIS — G894 Chronic pain syndrome: Secondary | ICD-10-CM | POA: Diagnosis not present

## 2021-11-18 DIAGNOSIS — M5136 Other intervertebral disc degeneration, lumbar region: Secondary | ICD-10-CM | POA: Diagnosis not present

## 2021-11-18 DIAGNOSIS — M792 Neuralgia and neuritis, unspecified: Secondary | ICD-10-CM | POA: Diagnosis not present

## 2021-11-25 DIAGNOSIS — H2511 Age-related nuclear cataract, right eye: Secondary | ICD-10-CM | POA: Diagnosis not present

## 2021-11-26 DIAGNOSIS — G894 Chronic pain syndrome: Secondary | ICD-10-CM | POA: Diagnosis not present

## 2021-11-29 ENCOUNTER — Other Ambulatory Visit: Payer: Self-pay | Admitting: Family Medicine

## 2021-11-29 DIAGNOSIS — G894 Chronic pain syndrome: Secondary | ICD-10-CM | POA: Diagnosis not present

## 2021-12-07 DIAGNOSIS — S46012D Strain of muscle(s) and tendon(s) of the rotator cuff of left shoulder, subsequent encounter: Secondary | ICD-10-CM | POA: Diagnosis not present

## 2021-12-13 ENCOUNTER — Telehealth: Payer: Self-pay | Admitting: Family Medicine

## 2021-12-13 NOTE — Telephone Encounter (Signed)
Nurses ?Please let patient know we received a request for him to come off of Plavix for his spinal injection. ?He would have to stop the Plavix 5 days before the spinal injection ?There is low risk for stroke or heart attack coming off of Plavix but he needs to give [place] permission that he desires to get this injection.  If he does desire to get the injection he needs to stop the Plavix 5 days before that. ? ?Upon receiving his permission we will fill out the form and send to his orthopedist ?

## 2021-12-14 NOTE — Telephone Encounter (Signed)
Left message to return call 

## 2021-12-15 ENCOUNTER — Encounter: Payer: Self-pay | Admitting: Family Medicine

## 2021-12-15 ENCOUNTER — Ambulatory Visit (INDEPENDENT_AMBULATORY_CARE_PROVIDER_SITE_OTHER): Payer: Medicare Other | Admitting: Family Medicine

## 2021-12-15 VITALS — BP 108/65 | HR 95 | Temp 97.2°F | Wt 215.2 lb

## 2021-12-15 DIAGNOSIS — R809 Proteinuria, unspecified: Secondary | ICD-10-CM

## 2021-12-15 DIAGNOSIS — R350 Frequency of micturition: Secondary | ICD-10-CM

## 2021-12-15 LAB — POCT URINALYSIS DIPSTICK
Glucose, UA: POSITIVE — AB
Protein, UA: POSITIVE — AB
Spec Grav, UA: >=1.03 — AB (ref 1.010–1.025)
pH, UA: 5 (ref 5.0–8.0)

## 2021-12-15 NOTE — Telephone Encounter (Signed)
I discussed this with the patient also Jim [name] is forwarding the form to the surgeon thank you ?

## 2021-12-15 NOTE — Telephone Encounter (Signed)
Patient seen by provider this afternoon - Can we close message? ?

## 2021-12-15 NOTE — Progress Notes (Signed)
? ?  Subjective:  ? ? Patient ID: Jim [name], male    DOB: [birth year], 66 y.o.   MRN: [id number] ? ?HPI ?Pt having urinary urgency and frequency. Began a few weeks ago. Strong stream when going. No burning with urination.  ? ?Results for orders placed or performed in visit on 12/15/21  ?POCT Urinalysis Dipstick  ?Result Value Ref Range  ? Color, UA    ? Clarity, UA    ? Glucose, UA Positive (A) Negative  ? Bilirubin, UA    ? Ketones, UA +   ? Spec Grav, UA >=1.030 (A) 1.010 - 1.025  ? Blood, UA    ? pH, UA 5.0 5.0 - 8.0  ? Protein, UA Positive (A) Negative  ? Urobilinogen, UA    ? Nitrite, UA    ? Leukocytes, UA    ? Appearance    ? Odor    ?  ? ? ?Review of Systems ? ?   ?Objective:  ? Physical Exam ?Lungs clear heart regular abdomen soft prostate mildly enlarged but no tenderness ? ?Urinalysis reviewed has some protein check urine ACR ? ?   ?Assessment & Plan:  ?Lab work ordered ?Possibility of BPH issues ?Possibility diabetes issues ?Healthy diet recommended stay away from caffeine's ?Hold off on medicine until we see the results of the test ? ?

## 2021-12-16 LAB — BASIC METABOLIC PANEL
BUN/Creatinine Ratio: 17 (ref 10–24)
BUN: 16 mg/dL (ref 8–27)
CO2: 16 mmol/L — ABNORMAL LOW (ref 20–29)
Calcium: 9.5 mg/dL (ref 8.6–10.2)
Chloride: 107 mmol/L — ABNORMAL HIGH (ref 96–106)
Creatinine, Ser: 0.93 mg/dL (ref 0.76–1.27)
Glucose: 118 mg/dL — ABNORMAL HIGH (ref 70–99)
Potassium: 4.6 mmol/L (ref 3.5–5.2)
Sodium: 143 mmol/L (ref 134–144)
eGFR: 90 mL/min/{1.73_m2} (ref >59)

## 2021-12-16 LAB — HEPATIC FUNCTION PANEL
ALT: 48 IU/L — ABNORMAL HIGH (ref 0–44)
AST: 32 IU/L (ref 0–40)
Albumin: 4.4 g/dL (ref 3.8–4.8)
Alkaline Phosphatase: 100 IU/L (ref 44–121)
Bilirubin Total: <0.2 mg/dL (ref 0.0–1.2)
Bilirubin, Direct: <0.1 mg/dL (ref 0.00–0.40)
Total Protein: 6.7 g/dL (ref 6.0–8.5)

## 2021-12-16 LAB — FRUCTOSAMINE: Fructosamine: 215 umol/L (ref 0–285)

## 2021-12-16 LAB — MICROALBUMIN / CREATININE URINE RATIO
Creatinine, Urine: 92.8 mg/dL
Microalb/Creat Ratio: 18 mg/g creat (ref 0–29)
Microalbumin, Urine: 17 ug/mL

## 2021-12-16 LAB — SPECIMEN STATUS REPORT

## 2021-12-26 DIAGNOSIS — M5136 Other intervertebral disc degeneration, lumbar region: Secondary | ICD-10-CM | POA: Diagnosis not present

## 2021-12-26 DIAGNOSIS — M169 Osteoarthritis of hip, unspecified: Secondary | ICD-10-CM | POA: Diagnosis not present

## 2021-12-29 ENCOUNTER — Other Ambulatory Visit: Payer: Self-pay

## 2021-12-29 DIAGNOSIS — R350 Frequency of micturition: Secondary | ICD-10-CM

## 2021-12-29 DIAGNOSIS — E1140 Type 2 diabetes mellitus with diabetic neuropathy, unspecified: Secondary | ICD-10-CM

## 2021-12-29 DIAGNOSIS — M1990 Unspecified osteoarthritis, unspecified site: Secondary | ICD-10-CM | POA: Diagnosis not present

## 2021-12-29 DIAGNOSIS — M545 Low back pain, unspecified: Secondary | ICD-10-CM | POA: Diagnosis not present

## 2021-12-29 DIAGNOSIS — G894 Chronic pain syndrome: Secondary | ICD-10-CM | POA: Diagnosis not present

## 2021-12-29 MED ORDER — TAMSULOSIN HCL 0.4 MG PO CAPS: 0.4000 mg | ORAL_CAPSULE | Freq: Every day | ORAL | 0 refills | Status: DC

## 2022-01-05 DIAGNOSIS — M48062 Spinal stenosis, lumbar region with neurogenic claudication: Secondary | ICD-10-CM | POA: Diagnosis not present

## 2022-01-13 DIAGNOSIS — M48062 Spinal stenosis, lumbar region with neurogenic claudication: Secondary | ICD-10-CM | POA: Diagnosis not present

## 2022-01-14 DIAGNOSIS — E1140 Type 2 diabetes mellitus with diabetic neuropathy, unspecified: Secondary | ICD-10-CM | POA: Diagnosis not present

## 2022-01-14 DIAGNOSIS — R350 Frequency of micturition: Secondary | ICD-10-CM | POA: Diagnosis not present

## 2022-01-15 LAB — COMPREHENSIVE METABOLIC PANEL
ALT: 49 IU/L — ABNORMAL HIGH (ref 0–44)
AST: 27 IU/L (ref 0–40)
Albumin/Globulin Ratio: 2 (ref 1.2–2.2)
Albumin: 4.3 g/dL (ref 3.8–4.8)
Alkaline Phosphatase: 93 IU/L (ref 44–121)
BUN/Creatinine Ratio: 13 (ref 10–24)
BUN: 13 mg/dL (ref 8–27)
Bilirubin Total: 0.6 mg/dL (ref 0.0–1.2)
CO2: 20 mmol/L (ref 20–29)
Calcium: 9.1 mg/dL (ref 8.6–10.2)
Chloride: 103 mmol/L (ref 96–106)
Creatinine, Ser: 0.98 mg/dL (ref 0.76–1.27)
Globulin, Total: 2.2 g/dL (ref 1.5–4.5)
Glucose: 124 mg/dL — ABNORMAL HIGH (ref 70–99)
Potassium: 5.1 mmol/L (ref 3.5–5.2)
Sodium: 141 mmol/L (ref 134–144)
Total Protein: 6.5 g/dL (ref 6.0–8.5)
eGFR: 85 mL/min/{1.73_m2} (ref >59)

## 2022-01-15 LAB — PSA: Prostate Specific Ag, Serum: 1.4 ng/mL (ref 0.0–4.0)

## 2022-01-19 ENCOUNTER — Other Ambulatory Visit: Payer: Self-pay | Admitting: Family Medicine

## 2022-01-19 DIAGNOSIS — R350 Frequency of micturition: Secondary | ICD-10-CM

## 2022-01-19 DIAGNOSIS — M48062 Spinal stenosis, lumbar region with neurogenic claudication: Secondary | ICD-10-CM | POA: Diagnosis not present

## 2022-01-25 DIAGNOSIS — M1990 Unspecified osteoarthritis, unspecified site: Secondary | ICD-10-CM | POA: Diagnosis not present

## 2022-01-25 DIAGNOSIS — M5136 Other intervertebral disc degeneration, lumbar region: Secondary | ICD-10-CM | POA: Diagnosis not present

## 2022-01-27 ENCOUNTER — Other Ambulatory Visit: Payer: Self-pay | Admitting: Family Medicine

## 2022-01-27 DIAGNOSIS — M1990 Unspecified osteoarthritis, unspecified site: Secondary | ICD-10-CM | POA: Diagnosis not present

## 2022-01-27 DIAGNOSIS — G894 Chronic pain syndrome: Secondary | ICD-10-CM | POA: Diagnosis not present

## 2022-01-27 DIAGNOSIS — R350 Frequency of micturition: Secondary | ICD-10-CM

## 2022-01-27 DIAGNOSIS — M5136 Other intervertebral disc degeneration, lumbar region: Secondary | ICD-10-CM | POA: Diagnosis not present

## 2022-01-27 DIAGNOSIS — M169 Osteoarthritis of hip, unspecified: Secondary | ICD-10-CM | POA: Diagnosis not present

## 2022-02-09 ENCOUNTER — Ambulatory Visit (INDEPENDENT_AMBULATORY_CARE_PROVIDER_SITE_OTHER): Payer: Medicare Other | Admitting: Family Medicine

## 2022-02-09 VITALS — BP 110/70 | HR 66 | Temp 97.7°F | Ht 66.0 in | Wt 224.2 lb

## 2022-02-09 DIAGNOSIS — R600 Localized edema: Secondary | ICD-10-CM | POA: Diagnosis not present

## 2022-02-09 DIAGNOSIS — I10 Essential (primary) hypertension: Secondary | ICD-10-CM

## 2022-02-09 DIAGNOSIS — E1169 Type 2 diabetes mellitus with other specified complication: Secondary | ICD-10-CM | POA: Diagnosis not present

## 2022-02-09 DIAGNOSIS — Z79899 Other long term (current) drug therapy: Secondary | ICD-10-CM

## 2022-02-09 DIAGNOSIS — E785 Hyperlipidemia, unspecified: Secondary | ICD-10-CM | POA: Diagnosis not present

## 2022-02-09 DIAGNOSIS — E1140 Type 2 diabetes mellitus with diabetic neuropathy, unspecified: Secondary | ICD-10-CM

## 2022-02-09 DIAGNOSIS — B9689 Other specified bacterial agents as the cause of diseases classified elsewhere: Secondary | ICD-10-CM | POA: Diagnosis not present

## 2022-02-09 DIAGNOSIS — J019 Acute sinusitis, unspecified: Secondary | ICD-10-CM | POA: Diagnosis not present

## 2022-02-09 MED ORDER — DOXYCYCLINE HYCLATE 100 MG PO TABS: 100.0000 mg | ORAL_TABLET | Freq: Two times a day (BID) | ORAL | 0 refills | Status: DC

## 2022-02-09 MED ORDER — HYDROCHLOROTHIAZIDE 12.5 MG PO CAPS: ORAL_CAPSULE | ORAL | 0 refills | Status: DC

## 2022-02-09 MED ORDER — LISINOPRIL 2.5 MG PO TABS: 2.5000 mg | ORAL_TABLET | Freq: Every day | ORAL | 1 refills | Status: DC

## 2022-02-09 NOTE — Patient Instructions (Addendum)
Hi Case  We need for you to do blood work to look at your kidney functions as well as the diabetes.  The lab will also do a urine test to check for protein.  Your blood pressure is in the low range so I would recommend reducing lisinopril.  You were on 5 mg I recommend reducing that to 2.5 mg daily.  We will send in a new prescription.  I will also add a low-dose diuretic you can take each morning as needed when you are having swelling. I recommend that you follow-up within 3 to 4 weeks to recheck how you are doing.  Please bring all of your medicines with you when you come so I can review over them as well.(You do not have to bring your pain medicine to that visit)  Also try to minimize salt use that includes salt use with tomatoes. By limiting salt use that will help reduce the swelling.  Also I will send in an antibiotic twice a day for the next 7 days for your infection but avoid excessive sun while on doxycycline.  Take with a tall glass of water with a snack.  Follow-up sooner if any problems or questions. We will see you back in approximately 3 to 4 weeks-thanks-Dr. Nicki Reaper

## 2022-02-09 NOTE — Progress Notes (Signed)
   Subjective:    Patient ID: Jim [name], male    DOB: [birth year], 66 y.o.   MRN: [id number]  HPI  Patient here for swelling in both legs and feet, has been going on for a week. Intermittent swelling in the lower legs present over the past week denies DOE denies PND denies sweats chills fevers denies leg pain in the calfs Patient also has been coughing up phlegm and congested, has been going on for a couple of weeks Denies any wheezing difficulty breathing just relates a lot of coughing congestion no hemoptysis Review of Systems     Objective:   Physical Exam  General-in no acute distress Eyes-no discharge Lungs-respiratory rate normal, CTA CV-no murmurs,RRR Extremities skin warm dry no edema Neuro grossly normal Behavior normal, alert  Blood pressure on recheck 110/70     Assessment & Plan:  1. HTN (hypertension), benign Blood pressure decent control continue current measures-reduce lisinopril to 2.5 mg - Hemoglobin D6Q - Basic metabolic panel - Microalbumin/Creatinine Ratio, Urine  2. Type 2 diabetes mellitus with diabetic neuropathy, without long-term current use of insulin ([place]) Continue current measures lab work ordered. - Hemoglobin I2L - Basic metabolic panel - Microalbumin/Creatinine Ratio, Urine  3. Hyperlipidemia associated with type 2 diabetes mellitus ([facility]CC) Continue current measures healthy diet continue Crestor - Hemoglobin N9G - Basic metabolic panel - Microalbumin/Creatinine Ratio, Urine  4. Pedal edema Low-dose diuretic HCTZ 12.5 every morning.  Cut back on salted tomato sandwiches.  5. Acute bacterial rhinosinusitis Antibiotics doxycycline twice daily 7 days take with snack to minimize nausea minimize sun  6. High risk medication use Labs ordered - Hemoglobin X2J - Basic metabolic panel - Microalbumin/Creatinine Ratio, Urine

## 2022-02-10 ENCOUNTER — Encounter: Payer: Self-pay | Admitting: Family Medicine

## 2022-02-10 LAB — BASIC METABOLIC PANEL
BUN/Creatinine Ratio: 16 (ref 10–24)
BUN: 15 mg/dL (ref 8–27)
CO2: 20 mmol/L (ref 20–29)
Calcium: 8.9 mg/dL (ref 8.6–10.2)
Chloride: 103 mmol/L (ref 96–106)
Creatinine, Ser: 0.91 mg/dL (ref 0.76–1.27)
Glucose: 129 mg/dL — ABNORMAL HIGH (ref 70–99)
Potassium: 4.7 mmol/L (ref 3.5–5.2)
Sodium: 141 mmol/L (ref 134–144)
eGFR: 92 mL/min/{1.73_m2} (ref >59)

## 2022-02-10 LAB — HEMOGLOBIN A1C
Est. average glucose Bld gHb Est-mCnc: 143 mg/dL
Hgb A1c MFr Bld: 6.6 % — ABNORMAL HIGH (ref 4.8–5.6)

## 2022-02-10 LAB — MICROALBUMIN / CREATININE URINE RATIO
Creatinine, Urine: 41.5 mg/dL
Microalb/Creat Ratio: 8 mg/g creat (ref 0–29)
Microalbumin, Urine: 3.3 ug/mL

## 2022-02-10 NOTE — Progress Notes (Signed)
Please mail a copy to the patient I am uncertain he uses MyChart

## 2022-02-16 ENCOUNTER — Ambulatory Visit: Payer: Self-pay | Admitting: Family Medicine

## 2022-02-18 ENCOUNTER — Ambulatory Visit: Payer: Medicare Other | Admitting: Family Medicine

## 2022-02-24 DIAGNOSIS — M169 Osteoarthritis of hip, unspecified: Secondary | ICD-10-CM | POA: Diagnosis not present

## 2022-02-24 DIAGNOSIS — M1990 Unspecified osteoarthritis, unspecified site: Secondary | ICD-10-CM | POA: Diagnosis not present

## 2022-02-24 DIAGNOSIS — M5136 Other intervertebral disc degeneration, lumbar region: Secondary | ICD-10-CM | POA: Diagnosis not present

## 2022-02-24 DIAGNOSIS — G894 Chronic pain syndrome: Secondary | ICD-10-CM | POA: Diagnosis not present

## 2022-03-02 ENCOUNTER — Ambulatory Visit (INDEPENDENT_AMBULATORY_CARE_PROVIDER_SITE_OTHER): Payer: Medicare Other | Admitting: Family Medicine

## 2022-03-02 ENCOUNTER — Encounter: Payer: Self-pay | Admitting: Family Medicine

## 2022-03-02 VITALS — BP 112/68 | HR 66 | Wt 217.2 lb

## 2022-03-02 DIAGNOSIS — E1140 Type 2 diabetes mellitus with diabetic neuropathy, unspecified: Secondary | ICD-10-CM | POA: Diagnosis not present

## 2022-03-02 DIAGNOSIS — E1169 Type 2 diabetes mellitus with other specified complication: Secondary | ICD-10-CM | POA: Diagnosis not present

## 2022-03-02 DIAGNOSIS — E785 Hyperlipidemia, unspecified: Secondary | ICD-10-CM

## 2022-03-02 DIAGNOSIS — I10 Essential (primary) hypertension: Secondary | ICD-10-CM

## 2022-03-02 MED ORDER — GLIPIZIDE ER 2.5 MG PO TB24: ORAL_TABLET | ORAL | 1 refills | Status: DC

## 2022-03-02 MED ORDER — CLOPIDOGREL BISULFATE 75 MG PO TABS: 75.0000 mg | ORAL_TABLET | Freq: Every day | ORAL | 1 refills | Status: DC

## 2022-03-02 MED ORDER — EMPAGLIFLOZIN 10 MG PO TABS: 10.0000 mg | ORAL_TABLET | Freq: Every day | ORAL | 5 refills | Status: DC

## 2022-03-02 MED ORDER — METFORMIN HCL 500 MG PO TABS: ORAL_TABLET | ORAL | 1 refills | Status: DC

## 2022-03-02 MED ORDER — BUPROPION HCL ER (XL) 150 MG PO TB24: 150.0000 mg | ORAL_TABLET | Freq: Every day | ORAL | 1 refills | Status: DC

## 2022-03-02 MED ORDER — FLUOXETINE HCL 20 MG PO CAPS: 60.0000 mg | ORAL_CAPSULE | Freq: Every day | ORAL | 1 refills | Status: DC

## 2022-03-02 MED ORDER — TRAZODONE HCL 50 MG PO TABS: ORAL_TABLET | ORAL | 1 refills | Status: DC

## 2022-03-02 MED ORDER — EZETIMIBE 10 MG PO TABS: 10.0000 mg | ORAL_TABLET | Freq: Every day | ORAL | 1 refills | Status: DC

## 2022-03-02 NOTE — Progress Notes (Signed)
   Subjective:    Patient ID: Jim [name], male    DOB: 29-Jul-1956, 66 y.o.   MRN: [id number]  HPI Pt arrives for 3 week follow up. Pt was seen on 02/09/22. Pt states things are much better. Legs have not been swelling since last visit.  He relates that his legs are doing much better.  He is cut out a lot of salt.  Minimizing tomato sandwiches.  Swelling is gone down.  Denies any chest tightness pressure pain shortness of breath Please write out a prescription for diabetic shoes for [employer] for diabetic neuropathy with calluses  Review of Systems     Objective:   Physical Exam General-in no acute distress Eyes-no discharge Lungs-respiratory rate normal, CTA CV-no murmurs,RRR Extremities skin warm dry no edema Neuro grossly normal Behavior normal, alert  Diabetic foot exam has minimal neuropathy in a couple of the toes pulses are normal calluses noted patient states he gets diabetic shoes every 1 to 2 years through [employer]:  Pedal edema resolved Doing much better  Diabetes with mild neuropathy of the feet with some calluses-diabetic shoes through [employer]

## 2022-03-04 ENCOUNTER — Other Ambulatory Visit: Payer: Self-pay | Admitting: Family Medicine

## 2022-03-13 IMAGING — MR MR SHOULDER*R* W/O CM
5 series · 40 of 40 positions shown · non-contrast
Comparison: Right shoulder x-rays dated August 06, 2020.

CLINICAL DATA: Right shoulder pain.

EXAM:
MRI OF THE RIGHT SHOULDER WITHOUT CONTRAST
TECHNIQUE: Multiplanar, multisequence MR imaging of the shoulder was performed.
No intravenous contrast was administered.

[Series 5: T2 fat-sat · axial · right · 4.0mm · 0.56mm/px · z∈[-61,+50]mm · 9 of 24 slices shown (1 of 3)]
[im 1/24]
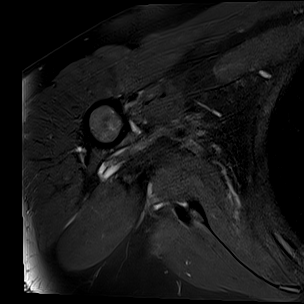
[im 3/24]
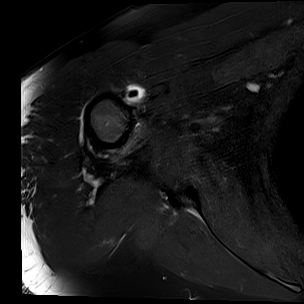
[im 6/24]
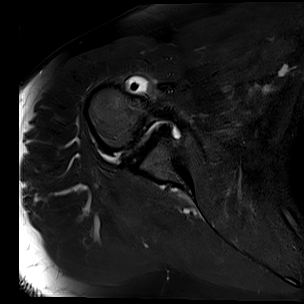
[im 9/24]
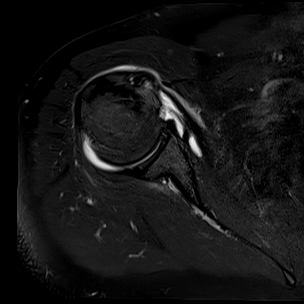
[im 12/24]
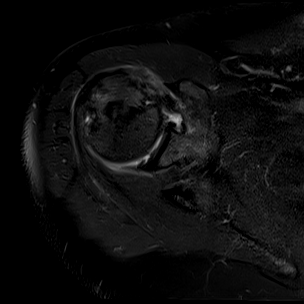
[im 15/24]
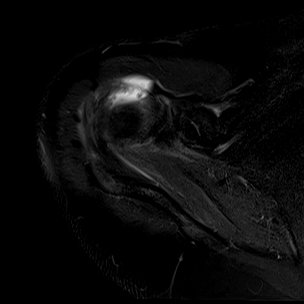
[im 18/24]
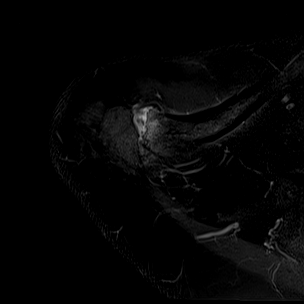
[im 21/24]
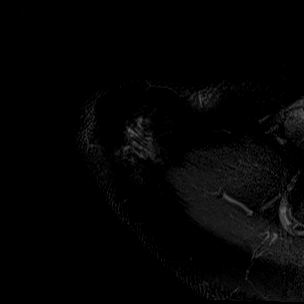
[im 24/24]
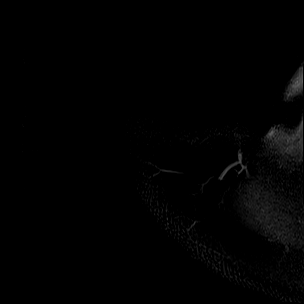

[Series 6: T1 · oblique · right · 4.0mm · 0.41mm/px · 7 of 19 slices shown]
[im 1/19]
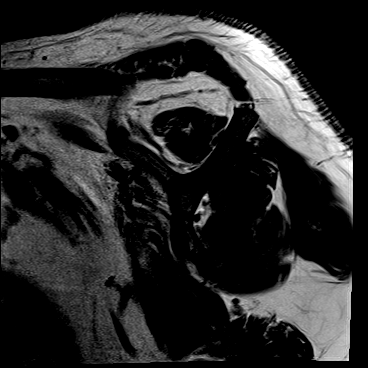
[im 4/19]
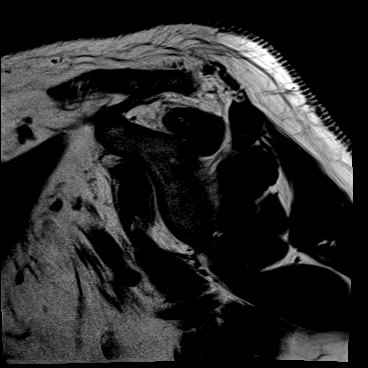
[im 7/19]
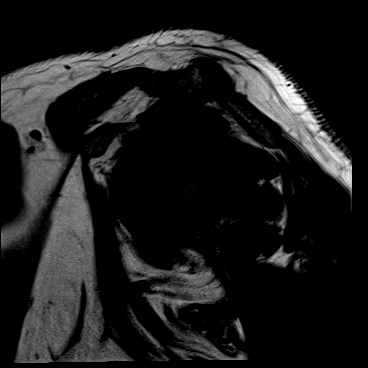
[im 10/19]
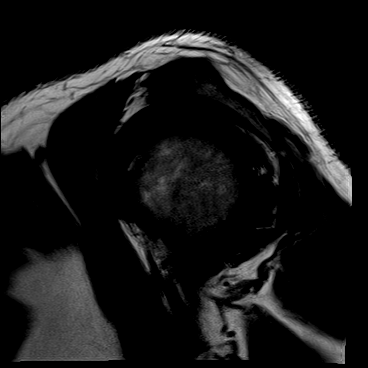
[im 13/19]
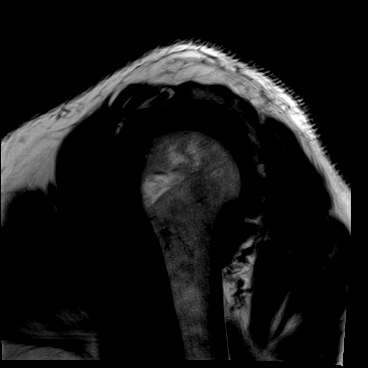
[im 16/19]
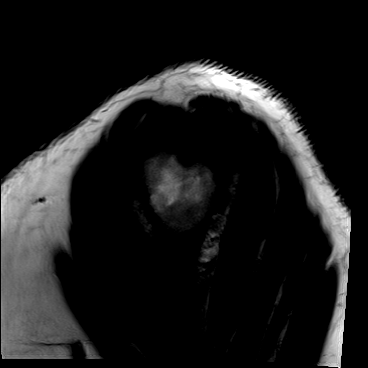
[im 19/19]
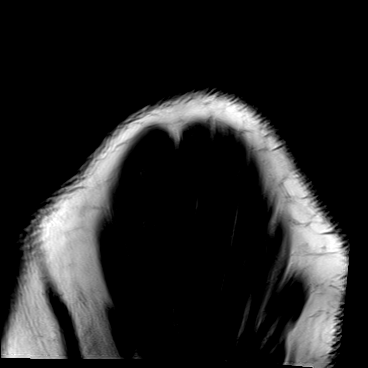

[Series 7: T2 fat-sat · oblique · right · 4.0mm · 0.47mm/px · 8 of 19 slices shown (2 of 3)]
[im 1/19]
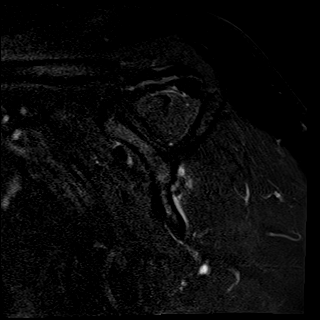
[im 3/19]
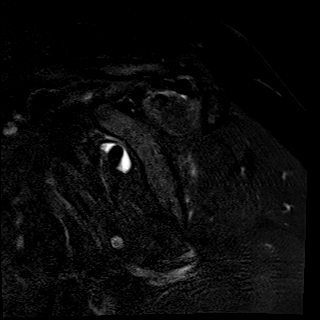
[im 6/19]
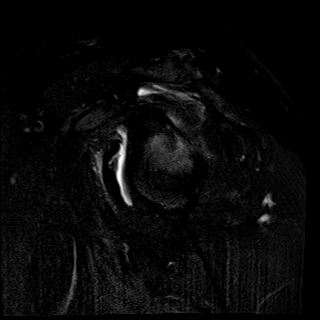
[im 8/19]
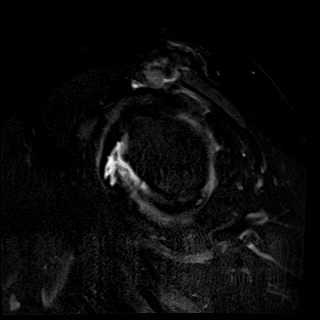
[im 11/19]
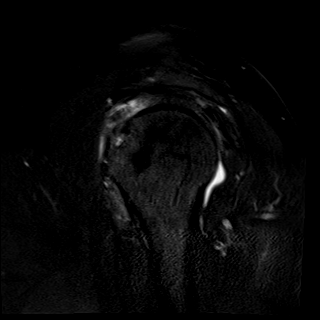
[im 13/19]
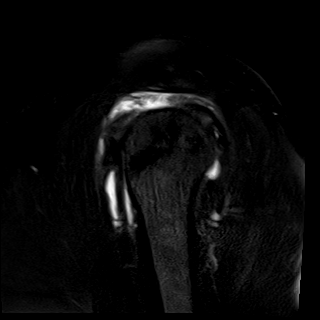
[im 16/19]
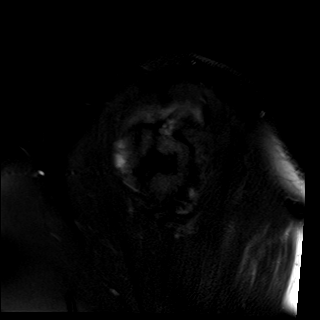
[im 19/19]
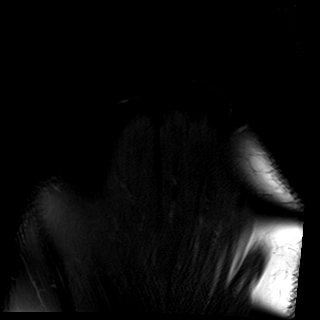

[Series 8: T2 fat-sat · oblique · right · 4.0mm · 0.47mm/px · 8 of 19 slices shown (3 of 3)]
[im 1/19]
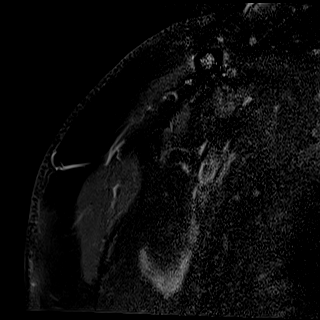
[im 3/19]
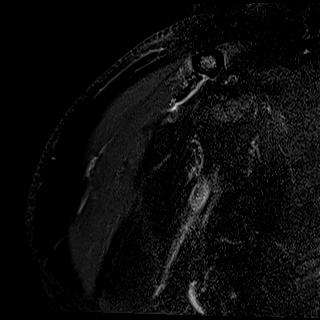
[im 6/19]
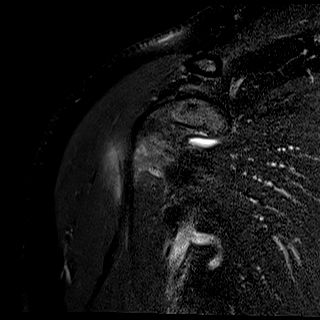
[im 8/19]
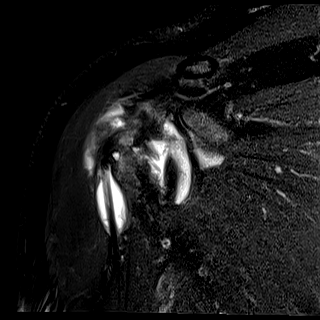
[im 11/19]
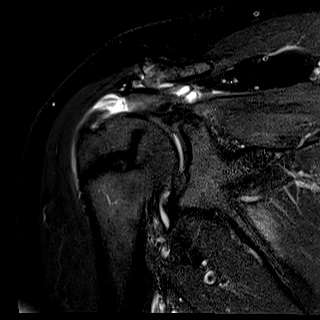
[im 13/19]
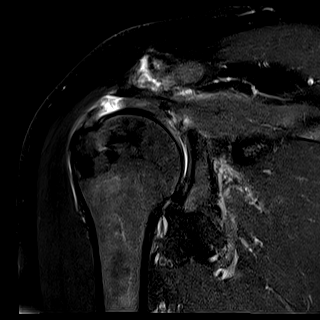
[im 16/19]
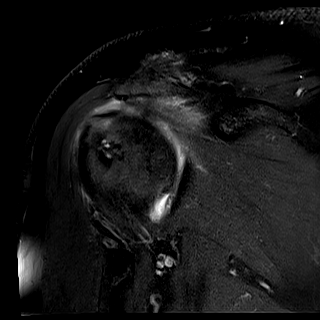
[im 19/19]
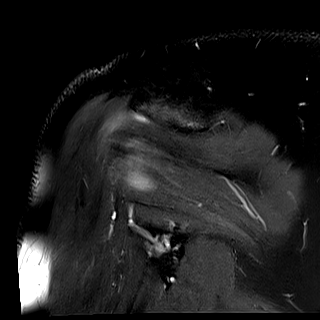

[Series 9: PD · oblique · right · 4.0mm · 0.43mm/px · 8 of 19 slices shown]
[im 1/19]
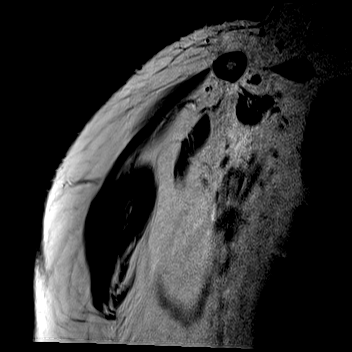
[im 3/19]
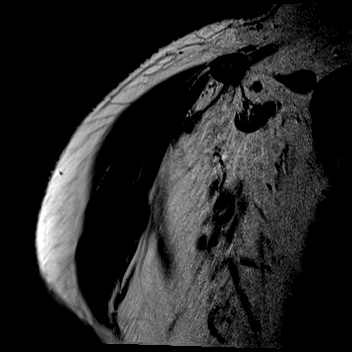
[im 6/19]
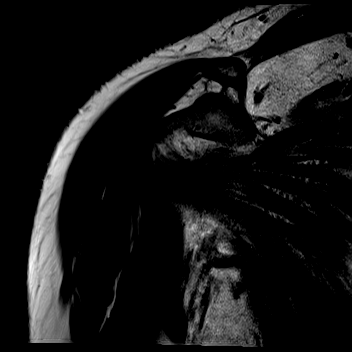
[im 8/19]
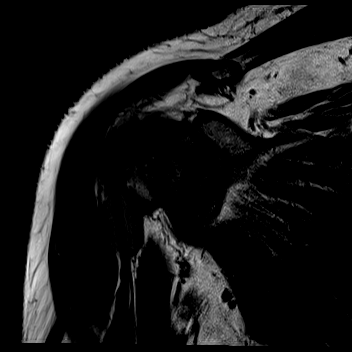
[im 11/19]
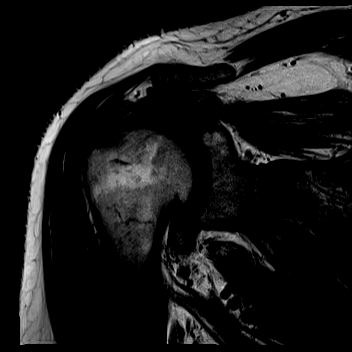
[im 13/19]
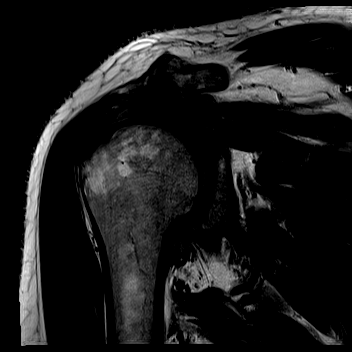
[im 16/19]
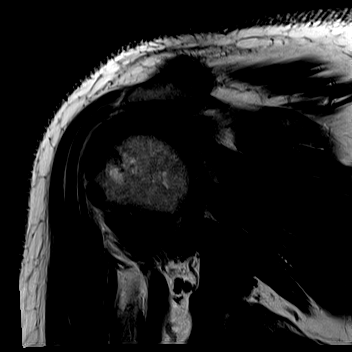
[im 19/19]
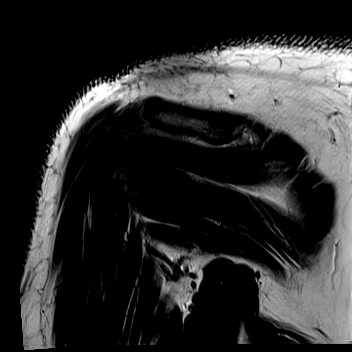

[40 of 40 positions shown; findings below may reference images not displayed]

FINDINGS: Rotator cuff: Severe supraspinatus tendinosis with full-thickness,
full width tear of the critical zone and up to 9 mm retraction.
Intact infraspinatus tendon with moderate tendinosis. High-grade
partial-thickness articular surface tear of the distal subscapularis
tendon. The teres minor tendon is intact.

Muscles:  Mild subscapularis muscle atrophy.  No muscle edema.

Biceps long head: Intact and normally positioned. Moderate
intra-articular tendinosis.

Acromioclavicular Joint: Moderate arthropathy of the
acromioclavicular joint. Type II acromion. Small amount of fluid in
the subacromial/subdeltoid bursa.

Glenohumeral Joint: Small joint effusion. Mild degenerative changes.

Labrum: Diffusely degenerated and frayed. Small tear of the
posterosuperior labrum.

Bones: No acute fracture or dislocation. No suspicious bone lesion.

Other: None.
IMPRESSION: 1. Severe supraspinatus tendinosis with full-thickness, full width
tear of the critical zone and up to 9 mm retraction. No muscle
atrophy.
2. High-grade partial-thickness articular surface tear of the distal
subscapularis tendon with mild muscle atrophy.
3. Moderate intra-articular biceps tendinosis.
4. Small tear of the posterosuperior labrum.
5. Moderate acromioclavicular and mild glenohumeral osteoarthritis.

## 2022-03-18 ENCOUNTER — Telehealth: Payer: Self-pay | Admitting: Family Medicine

## 2022-03-18 NOTE — Telephone Encounter (Signed)
Nurses Yesterday the patient presented stating that he wanted to have diabetic shoes.  At that time I thought that he would qualify but when I read over all the particulars of his situation I am not sure that he does qualify  [employer] sent me this print out from [employer] and it is incredibly detailed asking for a lot of different factors and I am not sure he qualifies as I read through all of this.  Therefore I cannot prescribe the diabetic shoes for him I recommend that if he feels he needs diabetic shoes we can send him to a podiatrist for them to get their determination  Please note-there is no guarantee that the podiatrist will approve the shoes but if you would like to try we can do the referral  Please also notify [facility] that we will not be providing any additional information for this patient it is our opinion that we would like to get a podiatry consult before proceeding forward for diabetic shoes

## 2022-03-19 NOTE — Telephone Encounter (Signed)
Called multiple times , bad connection. My chart message sent.

## 2022-03-26 DIAGNOSIS — M5136 Other intervertebral disc degeneration, lumbar region: Secondary | ICD-10-CM | POA: Diagnosis not present

## 2022-03-26 DIAGNOSIS — M1990 Unspecified osteoarthritis, unspecified site: Secondary | ICD-10-CM | POA: Diagnosis not present

## 2022-03-30 DIAGNOSIS — M5136 Other intervertebral disc degeneration, lumbar region: Secondary | ICD-10-CM | POA: Diagnosis not present

## 2022-03-30 DIAGNOSIS — M1990 Unspecified osteoarthritis, unspecified site: Secondary | ICD-10-CM | POA: Diagnosis not present

## 2022-03-30 DIAGNOSIS — M169 Osteoarthritis of hip, unspecified: Secondary | ICD-10-CM | POA: Diagnosis not present

## 2022-03-30 DIAGNOSIS — G894 Chronic pain syndrome: Secondary | ICD-10-CM | POA: Diagnosis not present

## 2022-04-13 ENCOUNTER — Encounter: Payer: Self-pay | Admitting: Family Medicine

## 2022-04-13 ENCOUNTER — Telehealth: Payer: Self-pay | Admitting: Family Medicine

## 2022-04-13 ENCOUNTER — Other Ambulatory Visit: Payer: Self-pay | Admitting: Family Medicine

## 2022-04-13 DIAGNOSIS — M25552 Pain in left hip: Secondary | ICD-10-CM

## 2022-04-13 DIAGNOSIS — G894 Chronic pain syndrome: Secondary | ICD-10-CM

## 2022-04-13 NOTE — Telephone Encounter (Signed)
Urgent referral placed. Referral coordinator made aware. My chart message sent to patient.

## 2022-04-13 NOTE — Telephone Encounter (Signed)
Nurses Please see my chart message Patient has been under the care of pain management His pain management doctor is retiring at the end of September Patient had requested referral to a new pain management center Please go ahead with this referral Urgent with [facility] if possible Also patient requested that we take over his pain management Please see U DT 2016 This is not something we can do Thank you

## 2022-04-13 NOTE — Telephone Encounter (Signed)
Nurses Patient needs referral to pain management. I would recommend trying [facility] or other similar pain management facility Please see telephone message Thank you-Dr. Nicki Reaper

## 2022-04-25 DIAGNOSIS — M5136 Other intervertebral disc degeneration, lumbar region: Secondary | ICD-10-CM | POA: Diagnosis not present

## 2022-04-25 DIAGNOSIS — M1990 Unspecified osteoarthritis, unspecified site: Secondary | ICD-10-CM | POA: Diagnosis not present

## 2022-04-26 DIAGNOSIS — M19012 Primary osteoarthritis, left shoulder: Secondary | ICD-10-CM | POA: Diagnosis not present

## 2022-04-26 DIAGNOSIS — M1990 Unspecified osteoarthritis, unspecified site: Secondary | ICD-10-CM | POA: Diagnosis not present

## 2022-04-26 DIAGNOSIS — M5136 Other intervertebral disc degeneration, lumbar region: Secondary | ICD-10-CM | POA: Diagnosis not present

## 2022-04-26 DIAGNOSIS — M169 Osteoarthritis of hip, unspecified: Secondary | ICD-10-CM | POA: Diagnosis not present

## 2022-04-26 DIAGNOSIS — G894 Chronic pain syndrome: Secondary | ICD-10-CM | POA: Diagnosis not present

## 2022-04-27 ENCOUNTER — Other Ambulatory Visit: Payer: Self-pay | Admitting: Family Medicine

## 2022-05-24 DIAGNOSIS — M5030 Other cervical disc degeneration, unspecified cervical region: Secondary | ICD-10-CM | POA: Diagnosis not present

## 2022-05-24 DIAGNOSIS — M5136 Other intervertebral disc degeneration, lumbar region: Secondary | ICD-10-CM | POA: Diagnosis not present

## 2022-05-24 DIAGNOSIS — M1990 Unspecified osteoarthritis, unspecified site: Secondary | ICD-10-CM | POA: Diagnosis not present

## 2022-05-24 DIAGNOSIS — G8929 Other chronic pain: Secondary | ICD-10-CM | POA: Diagnosis not present

## 2022-05-24 DIAGNOSIS — M169 Osteoarthritis of hip, unspecified: Secondary | ICD-10-CM | POA: Diagnosis not present

## 2022-05-24 DIAGNOSIS — G894 Chronic pain syndrome: Secondary | ICD-10-CM | POA: Diagnosis not present

## 2022-05-24 DIAGNOSIS — G5602 Carpal tunnel syndrome, left upper limb: Secondary | ICD-10-CM | POA: Diagnosis not present

## 2022-05-24 DIAGNOSIS — Z79891 Long term (current) use of opiate analgesic: Secondary | ICD-10-CM | POA: Diagnosis not present

## 2022-05-25 DIAGNOSIS — M5136 Other intervertebral disc degeneration, lumbar region: Secondary | ICD-10-CM | POA: Diagnosis not present

## 2022-05-25 DIAGNOSIS — M1990 Unspecified osteoarthritis, unspecified site: Secondary | ICD-10-CM | POA: Diagnosis not present

## 2022-05-31 ENCOUNTER — Ambulatory Visit (INDEPENDENT_AMBULATORY_CARE_PROVIDER_SITE_OTHER): Payer: Medicare Other

## 2022-05-31 VITALS — Ht 66.0 in | Wt 217.0 lb

## 2022-05-31 DIAGNOSIS — Z0000 Encounter for general adult medical examination without abnormal findings: Secondary | ICD-10-CM | POA: Diagnosis not present

## 2022-05-31 DIAGNOSIS — Z1211 Encounter for screening for malignant neoplasm of colon: Secondary | ICD-10-CM

## 2022-05-31 NOTE — Patient Instructions (Signed)
Mr. Jim [name] , Thank you for taking time to come for your Medicare Wellness Visit. I appreciate your ongoing commitment to your health goals. Please review the following plan we discussed and let me know if I can assist you in the future.   Screening recommendations/referrals: Colonoscopy: 01/31/12, referral sent for Cologuard Recommended yearly ophthalmology/optometry visit for glaucoma screening and checkup Recommended yearly dental visit for hygiene and checkup  Vaccinations: Influenza vaccine: 05/27/21 Pneumococcal vaccine: 06/11/20 Tdap vaccine: 05/18/19 Shingles vaccine: n/d   Covid-19: 07/31/20  Advanced directives: no  Conditions/risks identified: none  Next appointment: Follow up in one year for your annual wellness visit. 06/14/23 @ 9:35 am by phone  Preventive Care 67 Years and Older, Male Preventive care refers to lifestyle choices and visits with your health care provider that can promote health and wellness. What does preventive care include? A yearly physical exam. This is also called an annual well check. Dental exams once or twice a year. Routine eye exams. Ask your health care provider how often you should have your eyes checked. Personal lifestyle choices, including: Daily care of your teeth and gums. Regular physical activity. Eating a healthy diet. Avoiding tobacco and drug use. Limiting alcohol use. Practicing safe sex. Taking low doses of aspirin every day. Taking vitamin and mineral supplements as recommended by your health care provider. What happens during an annual well check? The services and screenings done by your health care provider during your annual well check will depend on your age, overall health, lifestyle risk factors, and family history of disease. Counseling  Your health care provider may ask you questions about your: Alcohol use. Tobacco use. Drug use. Emotional well-being. Home and relationship well-being. Sexual activity. Eating  habits. History of falls. Memory and ability to understand (cognition). Work and work Statistician. Screening  You may have the following tests or measurements: Height, weight, and BMI. Blood pressure. Lipid and cholesterol levels. These may be checked every 5 years, or more frequently if you are over 55 years old. Skin check. Lung cancer screening. You may have this screening every year starting at age 82 if you have a 30-pack-year history of smoking and currently smoke or have quit within the past 15 years. Fecal occult blood test (FOBT) of the stool. You may have this test every year starting at age 35. Flexible sigmoidoscopy or colonoscopy. You may have a sigmoidoscopy every 5 years or a colonoscopy every 10 years starting at age 70. Prostate cancer screening. Recommendations will vary depending on your family history and other risks. Hepatitis C blood test. Hepatitis B blood test. Sexually transmitted disease (STD) testing. Diabetes screening. This is done by checking your blood sugar (glucose) after you have not eaten for a while (fasting). You may have this done every 1-3 years. Abdominal aortic aneurysm (AAA) screening. You may need this if you are a current or former smoker. Osteoporosis. You may be screened starting at age 62 if you are at high risk. Talk with your health care provider about your test results, treatment options, and if necessary, the need for more tests. Vaccines  Your health care provider may recommend certain vaccines, such as: Influenza vaccine. This is recommended every year. Tetanus, diphtheria, and acellular pertussis (Tdap, Td) vaccine. You may need a Td booster every 10 years. Zoster vaccine. You may need this after age 4. Pneumococcal 13-valent conjugate (PCV13) vaccine. One dose is recommended after age 12. Pneumococcal polysaccharide (PPSV23) vaccine. One dose is recommended after age 53. Talk to your  health care provider about which screenings and  vaccines you need and how often you need them. This information is not intended to replace advice given to you by your health care provider. Make sure you discuss any questions you have with your health care provider. Document Released: 08/15/2015 Document Revised: 04/07/2016 Document Reviewed: 05/20/2015 Elsevier Interactive Patient Education  2017 [employer] Prevention in the Home Falls can cause injuries. They can happen to people of all ages. There are many things you can do to make your home safe and to help prevent falls. What can I do on the outside of my home? Regularly fix the edges of walkways and driveways and fix any cracks. Remove anything that might make you trip as you walk through a door, such as a raised step or threshold. Trim any bushes or trees on the path to your home. Use bright outdoor lighting. Clear any walking paths of anything that might make someone trip, such as rocks or tools. Regularly check to see if handrails are loose or broken. Make sure that both sides of any steps have handrails. Any raised decks and porches should have guardrails on the edges. Have any leaves, snow, or ice cleared regularly. Use sand or salt on walking paths during winter. Clean up any spills in your garage right away. This includes oil or grease spills. What can I do in the bathroom? Use night lights. Install grab bars by the toilet and in the tub and shower. Do not use towel bars as grab bars. Use non-skid mats or decals in the tub or shower. If you need to sit down in the shower, use a plastic, non-slip stool. Keep the floor dry. Clean up any water that spills on the floor as soon as it happens. Remove soap buildup in the tub or shower regularly. Attach bath mats securely with double-sided non-slip rug tape. Do not have throw rugs and other things on the floor that can make you trip. What can I do in the bedroom? Use night lights. Make sure that you have a light by your  bed that is easy to reach. Do not use any sheets or blankets that are too big for your bed. They should not hang down onto the floor. Have a firm chair that has side arms. You can use this for support while you get dressed. Do not have throw rugs and other things on the floor that can make you trip. What can I do in the kitchen? Clean up any spills right away. Avoid walking on wet floors. Keep items that you use a lot in easy-to-reach places. If you need to reach something above you, use a strong step stool that has a grab bar. Keep electrical cords out of the way. Do not use floor polish or wax that makes floors slippery. If you must use wax, use non-skid floor wax. Do not have throw rugs and other things on the floor that can make you trip. What can I do with my stairs? Do not leave any items on the stairs. Make sure that there are handrails on both sides of the stairs and use them. Fix handrails that are broken or loose. Make sure that handrails are as long as the stairways. Check any carpeting to make sure that it is firmly attached to the stairs. Fix any carpet that is loose or worn. Avoid having throw rugs at the top or bottom of the stairs. If you do have throw rugs, attach them  to the floor with carpet tape. Make sure that you have a light switch at the top of the stairs and the bottom of the stairs. If you do not have them, ask someone to add them for you. What else can I do to help prevent falls? Wear shoes that: Do not have high heels. Have rubber bottoms. Are comfortable and fit you well. Are closed at the toe. Do not wear sandals. If you use a stepladder: Make sure that it is fully opened. Do not climb a closed stepladder. Make sure that both sides of the stepladder are locked into place. Ask someone to hold it for you, if possible. Clearly mark and make sure that you can see: Any grab bars or handrails. First and last steps. Where the edge of each step is. Use tools that  help you move around (mobility aids) if they are needed. These include: Canes. Walkers. Scooters. Crutches. Turn on the lights when you go into a dark area. Replace any light bulbs as soon as they burn out. Set up your furniture so you have a clear path. Avoid moving your furniture around. If any of your floors are uneven, fix them. If there are any pets around you, be aware of where they are. Review your medicines with your doctor. Some medicines can make you feel dizzy. This can increase your chance of falling. Ask your doctor what other things that you can do to help prevent falls. This information is not intended to replace advice given to you by your health care provider. Make sure you discuss any questions you have with your health care provider. Document Released: 05/15/2009 Document Revised: 12/25/2015 Document Reviewed: 08/23/2014 Elsevier Interactive Patient Education  2017 [employer].

## 2022-05-31 NOTE — Addendum Note (Signed)
Addended by: Dionisio David on: 05/31/2022 10:09 AM   Modules accepted: Orders

## 2022-05-31 NOTE — Progress Notes (Signed)
Virtual Visit via Telephone Note  I connected with  Jim [name] on 05/31/22 at  9:45 AM EDT by telephone and verified that I am speaking with the correct person using two identifiers.  Location: Patient: home Provider: [facility] Persons participating in the virtual visit: patient/Nurse Health Advisor   I discussed the limitations, risks, security and privacy concerns of performing an evaluation and management service by telephone and the availability of in person appointments. The patient expressed understanding and agreed to proceed.  Interactive audio and video telecommunications were attempted between this nurse and patient, however failed, due to patient having technical difficulties OR patient did not have access to video capability.  We continued and completed visit with audio only.  Some vital signs may be absent or patient reported.   Dionisio David, LPN  Subjective:   Jim [name] is a 66 y.o. male who presents for Medicare Annual/Subsequent preventive examination.  Review of Systems     Cardiac Risk Factors include: advanced age (>27mn, >>79women);diabetes mellitus;male gender     Objective:    There were no vitals filed for this visit. There is no height or weight on file to calculate BMI.     05/31/2022    9:53 AM 08/24/2018   11:11 AM 05/19/2018    6:23 AM 07/23/2015   10:47 AM 07/11/2015   10:18 AM 09/04/2012    7:35 PM 08/29/2012   12:02 PM  Advanced Directives  Does Patient Have a Medical Advance Directive? _0  Patient does not have advance directive;Patient would not like information Patient does not have advance directive;Patient would not like information  Would patient like information on creating a medical advance directive? No - Patient declined No - Patient declined Yes (MAU/Ambulatory/Procedural Areas - Information given) No - patient declined information Yes - Educational materials given    Pre-existing out of facility DNR order (yellow  form or pink MOST form)      No No    Current Medications (verified) Outpatient Encounter Medications as of 05/31/2022  Medication Sig   albuterol (VENTOLIN HFA) 108 (90 Base) MCG/ACT inhaler Inhale 2 puffs into the lungs every 6 (six) hours as needed for wheezing.   aspirin EC 81 MG tablet Take 81 mg by mouth daily.   blood glucose meter kit and supplies Dispense based on patient and insurance preference. Use to check glucose once daily as directed. (FOR ICD-10 E11.9).   buPROPion (WELLBUTRIN XL) 150 MG 24 hr tablet Take 1 tablet (150 mg total) by mouth daily.   clopidogrel (PLAVIX) 75 MG tablet Take 1 tablet (75 mg total) by mouth daily.   diclofenac Sodium (VOLTAREN) 1 % GEL SMARTSIG:2-4 Gram(s) Topical 4 Times Daily   empagliflozin (JARDIANCE) 10 MG TABS tablet Take 1 tablet (10 mg total) by mouth daily before breakfast.   ezetimibe (ZETIA) 10 MG tablet Take 1 tablet (10 mg total) by mouth daily.   FLUoxetine (PROZAC) 20 MG capsule Take 3 capsules (60 mg total) by mouth daily.   glipiZIDE (GLUCOTROL XL) 2.5 MG 24 hr tablet 1 q am   hydrochlorothiazide (MICROZIDE) 12.5 MG capsule TAKE 1 CAPSULE BY MOUTH EVERY MORNING AS NEEDED FOR LEG EDEMA   Lancets (ONETOUCH ULTRASOFT) lancets    lisinopril (ZESTRIL) 5 MG tablet TAKE 1 TABLET BY MOUTH EVERY DAY   metFORMIN (GLUCOPHAGE) 500 MG tablet 2 qam and 2qevening   naloxone (NARCAN) nasal spray 4 mg/0.1 mL Use as directed   ONETOUCH VERIO test  strip USE TO CHECK BLOOD SUGAR LEVELS ONCE DAILY   OVER THE COUNTER MEDICATION One a day vitamin -takes 2 am and 2 pm   Oxycodone HCl 20 MG TABS TK 1/2-1 T 3-5 TIMES DAILY IF TOLERATED   rosuvastatin (CRESTOR) 5 MG tablet TAKE 1 TABLET ON MONDAY AND FRIDAY   Semaglutide,0.25 or 0.5MG/DOS, (OZEMPIC, 0.25 OR 0.5 MG/DOSE,) 2 MG/1.5ML SOPN    tamsulosin (FLOMAX) 0.4 MG CAPS capsule TAKE 1 CAPSULE BY MOUTH EVERY DAY   traZODone (DESYREL) 50 MG tablet 1 qhs prn insomnia   [DISCONTINUED] buPROPion (WELLBUTRIN  XL) 150 MG 24 hr tablet Take 1 tablet by mouth daily.   [DISCONTINUED] lisinopril (ZESTRIL) 2.5 MG tablet Take 1 tablet (2.5 mg total) by mouth daily.   No facility-administered encounter medications on file as of 05/31/2022.    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   Ex-smoker    Quit 11/07/2011   Gastric ulcer    History of colon polyps    Hyperlipidemia    Primary localized osteoarthritis of right knee    PVD (peripheral vascular disease) with claudication ([place]) 05/21/2018   Under the care of vascular surgery   Past Surgical History:  Procedure Laterality Date   ABDOMINAL AORTOGRAM W/LOWER EXTREMITY N/A 05/19/2018   Procedure: ABDOMINAL AORTOGRAM W/LOWER EXTREMITY;  Surgeon: Elam Dutch, MD;  Location: [facility];  Service: Cardiovascular;  Laterality: N/A;   CARPAL TUNNEL RELEASE     right   CARPAL TUNNEL RELEASE Left 05/16/13   Dr Noemi Chapel   COLONOSCOPY  01/31/2012   Procedure: COLONOSCOPY;  Surgeon: Daneil Dolin, MD;  [facility];  Service: Endoscopy;  Laterality: N/A;  7:30 AM   FINGER AMPUTATION     partial left 5th finger   JOINT REPLACEMENT     KNEE ARTHROSCOPY     right knee   ORIF FINGER FRACTURE     Right 5th finger, Amputation reatachment   PERIPHERAL VASCULAR INTERVENTION Right 05/19/2018   Procedure: PERIPHERAL VASCULAR INTERVENTION;  Surgeon: Elam Dutch, MD;  Location: [facility];  Service: Cardiovascular;  Laterality: Right;  right popliteal   TONSILLECTOMY     TOTAL KNEE ARTHROPLASTY  09/04/2012   Procedure: TOTAL KNEE ARTHROPLASTY;  Surgeon: Lorn Junes, MD;  Location: [facility];  Service: Orthopedics;  Laterality: Left;   TOTAL KNEE ARTHROPLASTY Right 07/21/2015   Procedure: RIGHT TOTAL KNEE ARTHROPLASTY;  Surgeon:  Elsie Saas, MD;  Location: [facility];  Service: Orthopedics;  Laterality: Right;   Family History  Problem Relation Age of Onset   Heart disease Mother    Heart disease Father    Diabetes type I Father    Hypertension Father    Cancer Father    Heart disease Sister    Heart disease Brother    Social History   Socioeconomic History   Marital status: Married    Spouse name: Not on file   Number of children: Not on file   Years of education: Not on file   Highest education level: Not on file  Occupational History   Occupation: disabled    Employer: OTHER  Tobacco Use   Smoking status: Former    Packs/day: 2.00  Years: 40.00    Total pack years: 80.00    Types: Cigarettes    Quit date: 10/31/2011    Years since quitting: 10.5   Smokeless tobacco: Never  Vaping Use   Vaping Use: Never used  Substance and Sexual Activity   Alcohol use: Yes    Comment: 1 beer every 3-4 months   Drug use: No   Sexual activity: Yes  Other Topics Concern   Not on file  Social History Narrative   Not on file   Social Determinants of Health   Financial Resource Strain: Low Risk  (05/31/2022)   Overall Financial Resource Strain (CARDIA)    Difficulty of Paying Living Expenses: Not hard at all  Food Insecurity: No Food Insecurity (05/31/2022)   Hunger Vital Sign    Worried About Running Out of Food in the Last Year: Never true    Ran Out of Food in the Last Year: Never true  Transportation Needs: No Transportation Needs (05/31/2022)   PRAPARE - Hydrologist (Medical): No    Lack of Transportation (Non-Medical): No  Physical Activity: Insufficiently Active (05/31/2022)   Exercise Vital Sign    Days of Exercise per Week: 2 days    Minutes of Exercise per Session: 20 min  Stress: No Stress Concern Present (05/31/2022)   [place]    Feeling of Stress : Not at all  Social Connections:  Moderately Isolated (05/31/2022)   Social Connection and Isolation Panel [NHANES]    Frequency of Communication with Friends and Family: More than three times a week    Frequency of Social Gatherings with Friends and Family: Once a week    Attends Religious Services: Never    Marine scientist or Organizations: No    Attends Music therapist: Never    Marital Status: Married    Tobacco Counseling Counseling given: Not Answered   Clinical Intake:  Pre-visit preparation completed: Yes  Pain : No/denies pain     Nutritional Risks: None Diabetes: Yes CBG done?: No Did pt. bring in CBG monitor from home?: No  How often do you need to have someone help you when you read instructions, pamphlets, or other written materials from your doctor or pharmacy?: 1 - Never  Diabetic?yes Nutrition Risk Assessment:  Has the patient had any N/V/D within the last 2 months?  No  Does the patient have any non-healing wounds?  No  Has the patient had any unintentional weight loss or weight gain?  No   Diabetes:  Is the patient diabetic?  Yes  If diabetic, was a CBG obtained today?  No  Did the patient bring in their glucometer from home?  No  How often do you monitor your CBG's? Once per week.   Financial Strains and Diabetes Management:  Are you having any financial strains with the device, your supplies or your medication? No .  Does the patient want to be seen by Chronic Care Management for management of their diabetes?  No  Would the patient like to be referred to a Nutritionist or for Diabetic Management?  No   Diabetic Exams:  Diabetic Eye Exam: Completed 10/01/21.  Pt has been advised about the importance in completing this exam.  Diabetic Foot Exam: Completed 10/21/20. Pt has been advised about the importance in completing this exam.   Interpreter Needed?: No  Information entered by :: Kirke Shaggy, LPN   Activities of  Daily Living    05/31/2022    9:54 AM   In your present state of health, do you have any difficulty performing the following activities:  Hearing? 0  Vision? 0  Difficulty concentrating or making decisions? 0  Walking or climbing stairs? 0  Dressing or bathing? 0  Doing errands, shopping? 0  Preparing Food and eating ? N  Using the Toilet? N  In the past six months, have you accidently leaked urine? N  Do you have problems with loss of bowel control? N  Managing your Medications? N  Managing your Finances? N  Housekeeping or managing your Housekeeping? N    Patient Care Team: Kathyrn Drown, MD as PCP - General (Family Medicine)  Indicate any recent Medical Services you may have received from other than [facility] providers in the past year (date may be approximate).     Assessment:   This is a routine wellness examination for [place].  Hearing/Vision screen Hearing Screening - Comments:: Wears aids Vision Screening - Comments:: Wears glasses- [facility]  Dietary issues and exercise activities discussed: Current Exercise Habits: Home exercise routine, Type of exercise: walking, Time (Minutes): 20, Frequency (Times/Week): 2, Weekly Exercise (Minutes/Week): 40, Intensity: Mild   Goals Addressed             This Visit's Progress    DIET - EAT MORE FRUITS AND VEGETABLES         Depression Screen    05/31/2022    9:52 AM 10/20/2021    4:26 PM 08/11/2021    2:26 PM 02/27/2021    3:55 PM 10/21/2020   10:02 AM 07/16/2020   10:24 AM 04/08/2020    9:29 AM  PHQ 2/9 Scores  PHQ - 2 Score 0 2 0 0 0 4 0  PHQ- 9 Score 0 4  0  12 0    Fall Risk    05/31/2022    9:54 AM 10/20/2021    4:01 PM 08/11/2021    2:25 PM 05/27/2021    9:10 AM 02/27/2021    3:55 PM  Fall Risk   Falls in the past year? 1 0 _0 Number falls in past yr: 1 0 0 1 1  Injury with Fall? 1 0 _1 Risk for fall due to : History of fall(s) No Fall Risks History of fall(s) Impaired balance/gait Impaired balance/gait  Follow up Falls  prevention discussed;Falls evaluation completed Falls evaluation completed Falls evaluation completed;Education provided Education provided;Falls evaluation completed;Falls prevention discussed Falls evaluation completed;Education provided    FALL RISK PREVENTION PERTAINING TO THE HOME:  Any stairs in or around the home? No  If so, are there any without handrails? No  Home free of loose throw rugs in walkways, pet beds, electrical cords, etc? Yes  Adequate lighting in your home to reduce risk of falls? Yes   ASSISTIVE DEVICES UTILIZED TO PREVENT FALLS:  Life alert? No  Use of a cane, walker or w/c? Yes  Grab bars in the bathroom? No  Shower chair or bench in shower? No  Elevated toilet seat or a handicapped toilet? No     Cognitive Function:      08/19/2017   12:18 PM  Montreal Cognitive Assessment   Visuospatial/ Executive (0/5) 5  Naming (0/3) 3  Attention: Read list of digits (0/2) 2  Attention: Read list of letters (0/1) 1  Attention: Serial 7 subtraction starting at 100 (0/3) 3  Language: Repeat phrase (0/2) 2  Language : Fluency (0/1) 1  Abstraction (0/2) 2  Delayed Recall (0/5) 2  Orientation (0/6) 6  Total 27  Adjusted Score (based on education) 28      05/31/2022    9:57 AM  6CIT Screen  What Year? 0 points  What month? 0 points  What time? 0 points  Count back from 20 0 points  Months in reverse 0 points  Repeat phrase 0 points  Total Score 0 points    Immunizations Immunization History  Administered Date(s) Administered   Fluad Quad(high Dose 65+) 04/30/2020, 05/27/2021   Influenza,inj,Quad PF,6+ Mos 05/12/2016, 06/09/2017, 06/14/2018, 05/29/2019   PFIZER Comirnaty(Gray Top)Covid-19 Tri-Sucrose Vaccine 07/31/2020   Pneumococcal Conjugate-13 01/07/2020   Pneumococcal Polysaccharide-23 08/13/2016, 06/11/2020   Tdap 03/17/2013    TDAP status: Up to date  Flu Vaccine status: Up to date  Pneumococcal vaccine status: Up to date  Covid-19  vaccine status: Completed vaccines  Qualifies for Shingles Vaccine? Yes   Zostavax completed No   Shingrix Completed?: No.    Education has been provided regarding the importance of this vaccine. Patient has been advised to call insurance company to determine out of pocket expense if they have not yet received this vaccine. Advised may also receive vaccine at local pharmacy or Health Dept. Verbalized acceptance and understanding.  Screening Tests Health Maintenance  Topic Date Due   Zoster Vaccines- Shingrix (1 of 2) Never done   Lung Cancer Screening  Never done   FOOT EXAM  10/21/2021   COLONOSCOPY (Pts 45-70yr Insurance coverage will need to be confirmed)  01/30/2022   INFLUENZA VACCINE  03/02/2022   HEMOGLOBIN A1C  08/12/2022   OPHTHALMOLOGY EXAM  10/02/2022   Diabetic kidney evaluation - GFR measurement  02/10/2023   Diabetic kidney evaluation - Urine ACR  02/10/2023   Medicare Annual Wellness (AWV)  06/01/2023   TETANUS/TDAP  05/17/2029   Pneumonia Vaccine 66 Years old  Completed   Hepatitis C Screening  Completed   HPV VACCINES  Aged Out   COVID-19 Vaccine  Discontinued    Health Maintenance  Health Maintenance Due  Topic Date Due   Zoster Vaccines- Shingrix (1 of 2) Never done   Lung Cancer Screening  Never done   FOOT EXAM  10/21/2021   COLONOSCOPY (Pts 45-440yrInsurance coverage will need to be confirmed)  01/30/2022   INFLUENZA VACCINE  03/02/2022    Colorectal cancer screening: Type of screening: Colonoscopy. Completed 01/31/12. Repeat every 10 years- wants Cologuard, referral sent  Lung Cancer Screening: (Low Dose CT Chest recommended if Age 67-80ears, 30 pack-year currently smoking OR have quit w/in 15years.) does not qualify.     Additional Screening:  Hepatitis C Screening: does qualify; Completed 01/02/18  Vision Screening: Recommended annual ophthalmology exams for early detection of glaucoma and other disorders of the eye. Is the patient up to date  with their annual eye exam?  Yes  Who is the provider or what is the name of the office in which the patient attends annual eye exams? [facility]If pt is not established with a provider, would they like to be referred to a provider to establish care? No .   Dental Screening: Recommended annual dental exams for proper oral hygiene  Community Resource Referral / Chronic Care Management: CRR required this visit?  No   CCM required this visit?  No      Plan:     I have personally reviewed and noted the following in the patient's  chart:   Medical and social history Use of alcohol, tobacco or illicit drugs  Current medications and supplements including opioid prescriptions. Patient is currently taking opioid prescriptions. Information provided to patient regarding non-opioid alternatives. Patient advised to discuss non-opioid treatment plan with their provider. Functional ability and status Nutritional status Physical activity Advanced directives List of other physicians Hospitalizations, surgeries, and ER visits in previous 12 months Vitals Screenings to include cognitive, depression, and falls Referrals and appointments  In addition, I have reviewed and discussed with patient certain preventive protocols, quality metrics, and best practice recommendations. A written personalized care plan for preventive services as well as general preventive health recommendations were provided to patient.     Dionisio David, LPN   [id number]   Nurse Notes: none

## 2022-06-21 DIAGNOSIS — M545 Low back pain, unspecified: Secondary | ICD-10-CM | POA: Diagnosis not present

## 2022-06-22 DIAGNOSIS — M7522 Bicipital tendinitis, left shoulder: Secondary | ICD-10-CM | POA: Diagnosis not present

## 2022-06-22 DIAGNOSIS — S46012D Strain of muscle(s) and tendon(s) of the rotator cuff of left shoulder, subsequent encounter: Secondary | ICD-10-CM | POA: Diagnosis not present

## 2022-06-23 ENCOUNTER — Other Ambulatory Visit: Payer: Self-pay | Admitting: Family Medicine

## 2022-06-23 DIAGNOSIS — E1140 Type 2 diabetes mellitus with diabetic neuropathy, unspecified: Secondary | ICD-10-CM

## 2022-06-23 DIAGNOSIS — R350 Frequency of micturition: Secondary | ICD-10-CM

## 2022-06-23 DIAGNOSIS — I10 Essential (primary) hypertension: Secondary | ICD-10-CM

## 2022-06-24 DIAGNOSIS — G5602 Carpal tunnel syndrome, left upper limb: Secondary | ICD-10-CM | POA: Diagnosis not present

## 2022-06-29 NOTE — Telephone Encounter (Signed)
First the Rybelsus is not on his list so make sure the pharmacy does not fill this- Please cancel Rybelsus at the pharmacy so they do not send it to [place] again thank you He may have refills on the others for 6 months.  He has a follow-up in February I believe he needs to keep that follow-up but he needs to do Q2V, metabolic 7, u urine ACR, lipid, liver

## 2022-07-07 ENCOUNTER — Encounter (HOSPITAL_COMMUNITY): Payer: Self-pay | Admitting: *Deleted

## 2022-07-07 ENCOUNTER — Inpatient Hospital Stay (HOSPITAL_COMMUNITY)
Admission: EM | Admit: 2022-07-07 | Discharge: 2022-07-09 | DRG: 433 | Disposition: A | Discharge status: Home / Self Care | Payer: Medicare Other | Attending: Family Medicine | Admitting: Family Medicine

## 2022-07-07 ENCOUNTER — Emergency Department (HOSPITAL_COMMUNITY): Payer: Medicare Other

## 2022-07-07 ENCOUNTER — Other Ambulatory Visit: Payer: Self-pay

## 2022-07-07 DIAGNOSIS — R1319 Other dysphagia: Secondary | ICD-10-CM

## 2022-07-07 DIAGNOSIS — E669 Obesity, unspecified: Secondary | ICD-10-CM | POA: Diagnosis present

## 2022-07-07 DIAGNOSIS — I8510 Secondary esophageal varices without bleeding: Secondary | ICD-10-CM | POA: Diagnosis not present

## 2022-07-07 DIAGNOSIS — K2970 Gastritis, unspecified, without bleeding: Secondary | ICD-10-CM | POA: Diagnosis not present

## 2022-07-07 DIAGNOSIS — E1151 Type 2 diabetes mellitus with diabetic peripheral angiopathy without gangrene: Secondary | ICD-10-CM | POA: Diagnosis not present

## 2022-07-07 DIAGNOSIS — Z87891 Personal history of nicotine dependence: Secondary | ICD-10-CM | POA: Diagnosis not present

## 2022-07-07 DIAGNOSIS — Z809 Family history of malignant neoplasm, unspecified: Secondary | ICD-10-CM

## 2022-07-07 DIAGNOSIS — Z96653 Presence of artificial knee joint, bilateral: Secondary | ICD-10-CM | POA: Diagnosis not present

## 2022-07-07 DIAGNOSIS — Z8711 Personal history of peptic ulcer disease: Secondary | ICD-10-CM | POA: Diagnosis not present

## 2022-07-07 DIAGNOSIS — M549 Dorsalgia, unspecified: Secondary | ICD-10-CM | POA: Diagnosis present

## 2022-07-07 DIAGNOSIS — Z7982 Long term (current) use of aspirin: Secondary | ICD-10-CM

## 2022-07-07 DIAGNOSIS — E785 Hyperlipidemia, unspecified: Secondary | ICD-10-CM | POA: Diagnosis present

## 2022-07-07 DIAGNOSIS — R1314 Dysphagia, pharyngoesophageal phase: Secondary | ICD-10-CM | POA: Diagnosis not present

## 2022-07-07 DIAGNOSIS — F32A Depression, unspecified: Secondary | ICD-10-CM | POA: Diagnosis not present

## 2022-07-07 DIAGNOSIS — Z888 Allergy status to other drugs, medicaments and biological substances status: Secondary | ICD-10-CM

## 2022-07-07 DIAGNOSIS — E1140 Type 2 diabetes mellitus with diabetic neuropathy, unspecified: Secondary | ICD-10-CM | POA: Diagnosis not present

## 2022-07-07 DIAGNOSIS — Z833 Family history of diabetes mellitus: Secondary | ICD-10-CM | POA: Diagnosis not present

## 2022-07-07 DIAGNOSIS — Z79899 Other long term (current) drug therapy: Secondary | ICD-10-CM

## 2022-07-07 DIAGNOSIS — I8500 Esophageal varices without bleeding: Secondary | ICD-10-CM | POA: Diagnosis not present

## 2022-07-07 DIAGNOSIS — K5730 Diverticulosis of large intestine without perforation or abscess without bleeding: Secondary | ICD-10-CM | POA: Diagnosis not present

## 2022-07-07 DIAGNOSIS — Z7902 Long term (current) use of antithrombotics/antiplatelets: Secondary | ICD-10-CM

## 2022-07-07 DIAGNOSIS — I739 Peripheral vascular disease, unspecified: Secondary | ICD-10-CM | POA: Diagnosis present

## 2022-07-07 DIAGNOSIS — R109 Unspecified abdominal pain: Secondary | ICD-10-CM | POA: Diagnosis present

## 2022-07-07 DIAGNOSIS — G894 Chronic pain syndrome: Secondary | ICD-10-CM | POA: Diagnosis present

## 2022-07-07 DIAGNOSIS — Z881 Allergy status to other antibiotic agents status: Secondary | ICD-10-CM

## 2022-07-07 DIAGNOSIS — K259 Gastric ulcer, unspecified as acute or chronic, without hemorrhage or perforation: Secondary | ICD-10-CM | POA: Diagnosis present

## 2022-07-07 DIAGNOSIS — K7460 Unspecified cirrhosis of liver: Secondary | ICD-10-CM | POA: Diagnosis not present

## 2022-07-07 DIAGNOSIS — R1310 Dysphagia, unspecified: Secondary | ICD-10-CM | POA: Diagnosis not present

## 2022-07-07 DIAGNOSIS — R1013 Epigastric pain: Secondary | ICD-10-CM | POA: Diagnosis not present

## 2022-07-07 DIAGNOSIS — I10 Essential (primary) hypertension: Secondary | ICD-10-CM | POA: Diagnosis not present

## 2022-07-07 DIAGNOSIS — Z8249 Family history of ischemic heart disease and other diseases of the circulatory system: Secondary | ICD-10-CM

## 2022-07-07 DIAGNOSIS — F1120 Opioid dependence, uncomplicated: Secondary | ICD-10-CM | POA: Diagnosis present

## 2022-07-07 DIAGNOSIS — R079 Chest pain, unspecified: Secondary | ICD-10-CM | POA: Diagnosis not present

## 2022-07-07 DIAGNOSIS — Z7984 Long term (current) use of oral hypoglycemic drugs: Secondary | ICD-10-CM

## 2022-07-07 DIAGNOSIS — K3189 Other diseases of stomach and duodenum: Secondary | ICD-10-CM | POA: Diagnosis not present

## 2022-07-07 HISTORY — DX: Ulcer of esophagus without bleeding: K22.10

## 2022-07-07 LAB — URINALYSIS, ROUTINE W REFLEX MICROSCOPIC
Bacteria, UA: NONE SEEN
Bilirubin Urine: NEGATIVE
Glucose, UA: >=500 mg/dL — AB
Hgb urine dipstick: NEGATIVE
Ketones, ur: NEGATIVE mg/dL
Leukocytes,Ua: NEGATIVE
Nitrite: NEGATIVE
Protein, ur: NEGATIVE mg/dL
Specific Gravity, Urine: >1.046 — ABNORMAL HIGH (ref 1.005–1.030)
pH: 8 (ref 5.0–8.0)

## 2022-07-07 LAB — COMPREHENSIVE METABOLIC PANEL
ALT: 48 U/L — ABNORMAL HIGH (ref 0–44)
AST: 27 U/L (ref 15–41)
Albumin: 4.1 g/dL (ref 3.5–5.0)
Alkaline Phosphatase: 81 U/L (ref 38–126)
Anion gap: 10 (ref 5–15)
BUN: 17 mg/dL (ref 8–23)
CO2: 26 mmol/L (ref 22–32)
Calcium: 9.1 mg/dL (ref 8.9–10.3)
Chloride: 101 mmol/L (ref 98–111)
Creatinine, Ser: 1 mg/dL (ref 0.61–1.24)
GFR, Estimated: >60 mL/min (ref >60)
Glucose, Bld: 150 mg/dL — ABNORMAL HIGH (ref 70–99)
Potassium: 4.4 mmol/L (ref 3.5–5.1)
Sodium: 137 mmol/L (ref 135–145)
Total Bilirubin: 1.1 mg/dL (ref 0.3–1.2)
Total Protein: 7 g/dL (ref 6.5–8.1)

## 2022-07-07 LAB — CBC WITH DIFFERENTIAL/PLATELET
Abs Immature Granulocytes: 0.02 10*3/uL (ref 0.00–0.07)
Basophils Absolute: 0 10*3/uL (ref 0.0–0.1)
Basophils Relative: 1 %
Eosinophils Absolute: 0.2 10*3/uL (ref 0.0–0.5)
Eosinophils Relative: 3 %
HCT: 53.3 % — ABNORMAL HIGH (ref 39.0–52.0)
Hemoglobin: 17.6 g/dL — ABNORMAL HIGH (ref 13.0–17.0)
Immature Granulocytes: 0 %
Lymphocytes Relative: 14 %
Lymphs Abs: 0.8 10*3/uL (ref 0.7–4.0)
MCH: 31.6 pg (ref 26.0–34.0)
MCHC: 33 g/dL (ref 30.0–36.0)
MCV: 95.7 fL (ref 80.0–100.0)
Monocytes Absolute: 0.7 10*3/uL (ref 0.1–1.0)
Monocytes Relative: 12 %
Neutro Abs: 4.3 10*3/uL (ref 1.7–7.7)
Neutrophils Relative %: 70 %
Platelets: 105 10*3/uL — ABNORMAL LOW (ref 150–400)
RBC: 5.57 MIL/uL (ref 4.22–5.81)
RDW: 14.6 % (ref 11.5–15.5)
WBC: 6 10*3/uL (ref 4.0–10.5)
nRBC: 0 % (ref 0.0–0.2)

## 2022-07-07 LAB — CBG MONITORING, ED
Glucose-Capillary: 124 mg/dL — ABNORMAL HIGH (ref 70–99)
Glucose-Capillary: 130 mg/dL — ABNORMAL HIGH (ref 70–99)

## 2022-07-07 LAB — LACTIC ACID, PLASMA: Lactic Acid, Venous: 1.1 mmol/L (ref 0.5–1.9)

## 2022-07-07 LAB — MAGNESIUM: Magnesium: 2.3 mg/dL (ref 1.7–2.4)

## 2022-07-07 LAB — TROPONIN I (HIGH SENSITIVITY)
Troponin I (High Sensitivity): 4 ng/L (ref <18)
Troponin I (High Sensitivity): 5 ng/L (ref <18)

## 2022-07-07 LAB — LIPASE, BLOOD: Lipase: 35 U/L (ref 11–51)

## 2022-07-07 MED ORDER — HYDROCHLOROTHIAZIDE 12.5 MG PO CAPS: 12.5000 mg | ORAL_CAPSULE | Freq: Every day | ORAL | Status: DC | PRN

## 2022-07-07 MED ORDER — BUPROPION HCL ER (XL) 150 MG PO TB24
150.0000 mg | ORAL_TABLET | Freq: Every day | ORAL | Status: DC
  Administered 2022-07-08 – 2022-07-09 (×2): 150 mg via ORAL
  Filled 2022-07-07 (×2): qty 1

## 2022-07-07 MED ORDER — PANTOPRAZOLE 80MG IVPB - SIMPLE MED
80.0000 mg | Freq: Once | INTRAVENOUS | Status: AC
  Administered 2022-07-07: 80 mg via INTRAVENOUS
  Filled 2022-07-07: qty 100

## 2022-07-07 MED ORDER — HYDROMORPHONE HCL 1 MG/ML IJ SOLN
0.5000 mg | INTRAMUSCULAR | Status: DC | PRN
  Administered 2022-07-07 – 2022-07-08 (×5): 0.5 mg via INTRAVENOUS
  Filled 2022-07-07 (×6): qty 0.5

## 2022-07-07 MED ORDER — SODIUM CHLORIDE 0.9 % IV SOLN: INTRAVENOUS | Status: DC

## 2022-07-07 MED ORDER — LACTATED RINGERS IV SOLN: INTRAVENOUS | Status: DC

## 2022-07-07 MED ORDER — ALBUTEROL SULFATE (2.5 MG/3ML) 0.083% IN NEBU: 2.5000 mg | INHALATION_SOLUTION | RESPIRATORY_TRACT | Status: DC | PRN

## 2022-07-07 MED ORDER — PANTOPRAZOLE SODIUM 40 MG IV SOLR: 40.0000 mg | Freq: Two times a day (BID) | INTRAVENOUS | Status: DC

## 2022-07-07 MED ORDER — ALUM & MAG HYDROXIDE-SIMETH 200-200-20 MG/5ML PO SUSP
30.0000 mL | Freq: Once | ORAL | Status: AC
  Administered 2022-07-07: 30 mL via ORAL
  Filled 2022-07-07: qty 30

## 2022-07-07 MED ORDER — PANTOPRAZOLE SODIUM 40 MG IV SOLR
40.0000 mg | Freq: Two times a day (BID) | INTRAVENOUS | Status: DC
  Administered 2022-07-07 (×2): 40 mg via INTRAVENOUS
  Filled 2022-07-07 (×2): qty 10

## 2022-07-07 MED ORDER — ONDANSETRON HCL 4 MG PO TABS: 4.0000 mg | ORAL_TABLET | Freq: Four times a day (QID) | ORAL | Status: DC | PRN

## 2022-07-07 MED ORDER — EZETIMIBE 10 MG PO TABS: 10.0000 mg | ORAL_TABLET | Freq: Every day | ORAL | Status: DC

## 2022-07-07 MED ORDER — ONDANSETRON HCL 4 MG/2ML IJ SOLN
4.0000 mg | Freq: Once | INTRAMUSCULAR | Status: AC
  Administered 2022-07-07: 4 mg via INTRAVENOUS
  Filled 2022-07-07: qty 2

## 2022-07-07 MED ORDER — HYDROMORPHONE HCL 1 MG/ML IJ SOLN
0.5000 mg | Freq: Once | INTRAMUSCULAR | Status: AC
  Administered 2022-07-07: 0.5 mg via INTRAVENOUS
  Filled 2022-07-07: qty 0.5

## 2022-07-07 MED ORDER — ROSUVASTATIN CALCIUM 5 MG PO TABS: 5.0000 mg | ORAL_TABLET | Freq: Every day | ORAL | Status: DC

## 2022-07-07 MED ORDER — ROSUVASTATIN CALCIUM 10 MG PO TABS
5.0000 mg | ORAL_TABLET | ORAL | Status: DC
  Administered 2022-07-09: 5 mg via ORAL
  Filled 2022-07-07: qty 1

## 2022-07-07 MED ORDER — FLUOXETINE HCL 20 MG PO CAPS: 60.0000 mg | ORAL_CAPSULE | Freq: Every day | ORAL | Status: DC

## 2022-07-07 MED ORDER — FAMOTIDINE IN NACL 20-0.9 MG/50ML-% IV SOLN
20.0000 mg | Freq: Once | INTRAVENOUS | Status: AC
  Administered 2022-07-07: 20 mg via INTRAVENOUS
  Filled 2022-07-07: qty 50

## 2022-07-07 MED ORDER — IOHEXOL 300 MG/ML  SOLN
100.0000 mL | Freq: Once | INTRAMUSCULAR | Status: AC | PRN
  Administered 2022-07-07: 100 mL via INTRAVENOUS

## 2022-07-07 MED ORDER — TAMSULOSIN HCL 0.4 MG PO CAPS
0.4000 mg | ORAL_CAPSULE | Freq: Every day | ORAL | Status: DC
  Administered 2022-07-07 – 2022-07-09 (×3): 0.4 mg via ORAL
  Filled 2022-07-07 (×3): qty 1

## 2022-07-07 MED ORDER — HYDROMORPHONE HCL 1 MG/ML IJ SOLN
1.0000 mg | Freq: Once | INTRAMUSCULAR | Status: AC
  Administered 2022-07-07: 1 mg via INTRAVENOUS
  Filled 2022-07-07: qty 1

## 2022-07-07 MED ORDER — GABAPENTIN 100 MG PO CAPS
100.0000 mg | ORAL_CAPSULE | Freq: Two times a day (BID) | ORAL | Status: DC
  Administered 2022-07-07 – 2022-07-09 (×4): 100 mg via ORAL
  Filled 2022-07-07 (×4): qty 1

## 2022-07-07 MED ORDER — HYDRALAZINE HCL 20 MG/ML IJ SOLN: 5.0000 mg | INTRAMUSCULAR | Status: DC | PRN

## 2022-07-07 MED ORDER — LIDOCAINE VISCOUS HCL 2 % MT SOLN
15.0000 mL | Freq: Once | OROMUCOSAL | Status: AC
  Administered 2022-07-07: 15 mL via ORAL
  Filled 2022-07-07: qty 15

## 2022-07-07 MED ORDER — EZETIMIBE 10 MG PO TABS
10.0000 mg | ORAL_TABLET | Freq: Every day | ORAL | Status: DC
  Administered 2022-07-08 – 2022-07-09 (×2): 10 mg via ORAL
  Filled 2022-07-07 (×2): qty 1

## 2022-07-07 MED ORDER — LISINOPRIL 5 MG PO TABS
5.0000 mg | ORAL_TABLET | Freq: Every day | ORAL | Status: DC
  Administered 2022-07-07 – 2022-07-08 (×2): 5 mg via ORAL
  Filled 2022-07-07 (×2): qty 1

## 2022-07-07 MED ORDER — LACTATED RINGERS IV BOLUS
1000.0000 mL | Freq: Once | INTRAVENOUS | Status: AC
  Administered 2022-07-07: 1000 mL via INTRAVENOUS

## 2022-07-07 MED ORDER — SUCRALFATE 1 GM/10ML PO SUSP
1.0000 g | Freq: Four times a day (QID) | ORAL | Status: DC
  Administered 2022-07-07 – 2022-07-09 (×6): 1 g via ORAL
  Filled 2022-07-07 (×6): qty 10

## 2022-07-07 MED ORDER — INSULIN ASPART 100 UNIT/ML IJ SOLN
0.0000 [IU] | INTRAMUSCULAR | Status: DC
  Administered 2022-07-07 – 2022-07-09 (×6): 1 [IU] via SUBCUTANEOUS
  Filled 2022-07-07 (×3): qty 1

## 2022-07-07 MED ORDER — PANTOPRAZOLE INFUSION (NEW) - SIMPLE MED
8.0000 mg/h | INTRAVENOUS | Status: DC
  Administered 2022-07-07 – 2022-07-09 (×4): 8 mg/h via INTRAVENOUS
  Filled 2022-07-07 (×8): qty 100
  Filled 2022-07-07: qty 80
  Filled 2022-07-07: qty 100

## 2022-07-07 MED ORDER — FENTANYL CITRATE PF 50 MCG/ML IJ SOSY
50.0000 ug | PREFILLED_SYRINGE | Freq: Once | INTRAMUSCULAR | Status: AC
  Administered 2022-07-07: 50 ug via INTRAVENOUS
  Filled 2022-07-07: qty 1

## 2022-07-07 MED ORDER — ONDANSETRON HCL 4 MG/2ML IJ SOLN: 4.0000 mg | Freq: Four times a day (QID) | INTRAMUSCULAR | Status: DC | PRN

## 2022-07-07 MED ORDER — NALOXONE HCL 0.4 MG/ML IJ SOLN: 0.4000 mg | INTRAMUSCULAR | Status: DC | PRN

## 2022-07-07 MED ORDER — TRAZODONE HCL 50 MG PO TABS: 100.0000 mg | ORAL_TABLET | Freq: Every evening | ORAL | Status: DC | PRN

## 2022-07-07 MED ORDER — OXYCODONE HCL 5 MG PO TABS
20.0000 mg | ORAL_TABLET | ORAL | Status: DC | PRN
  Administered 2022-07-07 – 2022-07-09 (×8): 20 mg via ORAL
  Filled 2022-07-07 (×9): qty 4

## 2022-07-07 MED ORDER — BUPROPION HCL ER (XL) 150 MG PO TB24: 150.0000 mg | ORAL_TABLET | Freq: Every day | ORAL | Status: DC

## 2022-07-07 MED ORDER — OXYCODONE HCL 5 MG PO TABS: ORAL_TABLET | ORAL | Status: DC | PRN

## 2022-07-07 MED ORDER — FLUOXETINE HCL 20 MG PO CAPS
60.0000 mg | ORAL_CAPSULE | Freq: Every day | ORAL | Status: DC
  Administered 2022-07-08 – 2022-07-09 (×2): 60 mg via ORAL
  Filled 2022-07-07 (×2): qty 3

## 2022-07-07 NOTE — ED Notes (Signed)
Pt ambulated self to the restroom without difficulties.

## 2022-07-07 NOTE — ED Triage Notes (Signed)
Pt c/o epigastric pain x 2 days; pt has been nauseous

## 2022-07-07 NOTE — H&P (Signed)
TRH H&P   Patient Demographics:    Jim [name], is a 66 y.o. male  MRN: [id number]   DOB - 1955/10/02  Admit Date - 07/07/2022  Outpatient Primary MD for the patient is Kathyrn Drown, MD  Referring MD/NP/PA: Dr Doren Custard   Patient coming from: home  Chief Complaint  Patient presents with   Abdominal Pain      HPI:    Jim [name]  is a 66 y.o. male, with past medical history of arthritis, hypertension, hyperlipidemia, diabetes mellitus, type II, peripheral vascular disease, depression, chronic pain syndrome, history of PUD in the past, patient presents to ED secondary to complaints of abdominal pain, patient reports pain developed over last 48 hours, sudden onset, denies any relieving or provoking factors, denies any postprandial pain, reports this pain resembles gastric ulcer pain he had 25 years ago, [place] being any more on any acid blocking medicines, he did try Tums, with no relief, denies any nausea, or vomiting, but reports unable to eat and oral intake due to pain, he denies any NSAID take, movement was yesterday, no melena, no coffee-ground emesis -In ED CT abdomen pelvis with no acute findings, lipase within normal limit, pain is intractable, unable to tolerate any oral intake, requiring IV pain medicine for pain control, so [facility] consulted to admit.    Review of systems:     A full 10 point Review of Systems was done, except as stated above, all other Review of Systems were negative.   With Past History of the following :    Past Medical History:  Diagnosis Date   Arthritis    Back pain    Bursitis of hip    Bilateral   Depression    DJD (degenerative joint disease) of knee    Bilateral Left > Right   Ex-smoker    Quit 11/07/2011   Gastric ulcer    History of colon polyps    Hyperlipidemia    Primary localized osteoarthritis of right knee    PVD  (peripheral vascular disease) with claudication ([place]) 05/21/2018   Under the care of vascular surgery      Past Surgical History:  Procedure Laterality Date   ABDOMINAL AORTOGRAM W/LOWER EXTREMITY N/A 05/19/2018   Procedure: ABDOMINAL AORTOGRAM W/LOWER EXTREMITY;  Surgeon: Elam Dutch, MD;  Location: [facility];  Service: Cardiovascular;  Laterality: N/A;   CARPAL TUNNEL RELEASE     right   CARPAL TUNNEL RELEASE Left 05/16/13   Dr Noemi Chapel   COLONOSCOPY  01/31/2012   Procedure: COLONOSCOPY;  Surgeon: Daneil Dolin, MD;  Location: [facility];  Service: Endoscopy;  Laterality: N/A;  7:30 AM   FINGER AMPUTATION     partial left 5th finger   JOINT REPLACEMENT     KNEE ARTHROSCOPY     right knee   ORIF FINGER FRACTURE  Right 5th finger, Amputation reatachment   PERIPHERAL VASCULAR INTERVENTION Right 05/19/2018   Procedure: PERIPHERAL VASCULAR INTERVENTION;  Surgeon: Elam Dutch, MD;  Location: [facility];  Service: Cardiovascular;  Laterality: Right;  right popliteal   TONSILLECTOMY     TOTAL KNEE ARTHROPLASTY  09/04/2012   Procedure: TOTAL KNEE ARTHROPLASTY;  Surgeon: Lorn Junes, MD;  Location: [facility];  Service: Orthopedics;  Laterality: Left;   TOTAL KNEE ARTHROPLASTY Right 07/21/2015   Procedure: RIGHT TOTAL KNEE ARTHROPLASTY;  Surgeon: Elsie Saas, MD;  Location: [facility];  Service: Orthopedics;  Laterality: Right;      Social History:     Social History   Tobacco Use   Smoking status: Former    Packs/day: 2.00    Years: 40.00    Total pack years: 80.00    Types: Cigarettes    Quit date: 10/31/2011    Years since quitting: 10.6   Smokeless tobacco: Never  Substance Use Topics   Alcohol use: Yes    Comment: 1 beer every 3-4 months       Family History :     Family History  Problem Relation Age of Onset   Heart disease Mother    Heart disease Father    Diabetes type I Father    Hypertension Father    Cancer Father    Heart  disease Sister    Heart disease Brother       Home Medications:   Prior to Admission medications   Medication Sig Start Date End Date Taking? Authorizing Provider  acetaminophen (TYLENOL) 650 MG CR tablet Take 1,300 mg by mouth daily as needed for pain.   Yes [provider]  albuterol (VENTOLIN HFA) 108 (90 Base) MCG/ACT inhaler Inhale 2 puffs into the lungs every 6 (six) hours as needed for wheezing. 10/21/20  Yes Kathyrn Drown, MD  aspirin EC 81 MG tablet Take 81 mg by mouth daily.   Yes [provider]  buPROPion (WELLBUTRIN XL) 150 MG 24 hr tablet Take 1 tablet (150 mg total) by mouth daily. 03/02/22  Yes Kathyrn Drown, MD  clopidogrel (PLAVIX) 75 MG tablet Take 1 tablet (75 mg total) by mouth daily. 03/02/22  Yes Kathyrn Drown, MD  diclofenac Sodium (VOLTAREN) 1 % GEL Apply 2-4 g topically 4 (four) times daily. 04/19/22  Yes [provider]  ezetimibe (ZETIA) 10 MG tablet Take 1 tablet (10 mg total) by mouth daily. 03/02/22  Yes Kathyrn Drown, MD  FLUoxetine (PROZAC) 20 MG capsule Take 3 capsules (60 mg total) by mouth daily. 03/02/22  Yes Luking, Elayne Snare, MD  gabapentin (NEURONTIN) 100 MG capsule Take 100 mg by mouth 2 (two) times daily. 06/21/22  Yes [provider]  glipiZIDE (GLUCOTROL XL) 2.5 MG 24 hr tablet 1 q am 03/02/22  Yes Luking, Scott A, MD  hydrochlorothiazide (MICROZIDE) 12.5 MG capsule TAKE 1 CAPSULE BY MOUTH EVERY MORNING AS NEEDED FOR LEG EDEMA Patient taking differently: Take 12.5 mg by mouth See admin instructions. TAKE 1 CAPSULE BY MOUTH EVERY MORNING AS NEEDED FOR LEG EDEMA 06/30/22  Yes Luking, Scott A, MD  JARDIANCE 10 MG TABS tablet TAKE 1 TABLET BY MOUTH DAILY BEFORE BREAKFAST. 06/30/22  Yes Luking, Scott A, MD  lisinopril (ZESTRIL) 5 MG tablet TAKE 1 TABLET BY MOUTH EVERY DAY 04/27/22  Yes Kathyrn Drown, MD  metFORMIN (GLUCOPHAGE) 500 MG tablet 2 qam and 2qevening 03/02/22  Yes Luking, Elayne Snare, MD  naloxone (  NARCAN) nasal spray 4  mg/0.1 mL Use as directed 07/16/20  Yes Luking, Elayne Snare, MD  OVER THE COUNTER MEDICATION Take 1 tablet by mouth daily.   Yes [provider]  Oxycodone HCl 20 MG TABS Take 1 tablet by mouth See admin instructions. Take 1 tablet by mouth 4-5 times daily if tolerated 07/21/18  Yes [provider]  rosuvastatin (CRESTOR) 5 MG tablet TAKE 1 TABLET ON MONDAY AND FRIDAY 11/30/21  Yes Kathyrn Drown, MD  tamsulosin (FLOMAX) 0.4 MG CAPS capsule TAKE 1 CAPSULE BY MOUTH EVERY DAY 06/30/22  Yes Kathyrn Drown, MD  traZODone (DESYREL) 50 MG tablet 1 qhs prn insomnia 03/02/22  Yes Luking, Elayne Snare, MD  blood glucose meter kit and supplies Dispense based on patient and insurance preference. Use to check glucose once daily as directed. (FOR ICD-10 E11.9). 05/05/20   Kathyrn Drown, MD  Lancets Glory Rosebush ULTRASOFT) lancets  05/06/20   [provider]  Roma Schanz test strip USE TO CHECK BLOOD SUGAR LEVELS ONCE DAILY 11/18/20   Kathyrn Drown, MD     Allergies:     Allergies  Allergen Reactions   Doxycycline Nausea Only   Pravastatin Other (See Comments)    myalgia     Physical Exam:   Vitals  Blood pressure (!) 152/83, pulse 62, temperature 97.6 F (36.4 C), temperature source Oral, resp. rate 18, height _0  (1.727 m), weight 96.2 kg, SpO2 96 %.   1. General well developed lying in bed in NAD  2. Normal affect and insight, Not Suicidal or Homicidal, Awake Alert, Oriented X 3.  3. No F.N deficits, ALL C.Nerves Intact, Strength 5/5 all 4 extremities, Sensation intact all 4 extremities, Plantars down going.  4. Ears and Eyes appear Normal, Conjunctivae clear, PERRLA. Moist Oral Mucosa.  5. Supple Neck, No JVD, No cervical lymphadenopathy appriciated, No Carotid Bruits.  6. Symmetrical Chest wall movement, Good air movement bilaterally, CTAB.  7. RRR, No Gallops, Rubs or Murmurs, No Parasternal Heave.  8. Positive Bowel Sounds, Abdomen Soft, significant epigastric  tenderness to palpation, right upper quadrant tenderness, no lower quadrant tenderness, he is  negative for rebound tenderness, Rovsing sign, psoas sign and obturator sign.  9.  No Cyanosis, Normal Skin Turgor, No Skin Rash or Bruise.  10. Good muscle tone,  joints appear normal , no effusions, Normal ROM.     Data Review:    CBC Recent Labs  Lab 07/07/22 0926  WBC 6.0  HGB 17.6*  HCT 53.3*  PLT 105*  MCV 95.7  MCH 31.6  MCHC 33.0  RDW 14.6  LYMPHSABS 0.8  MONOABS 0.7  EOSABS 0.2  BASOSABS 0.0   ------------------------------------------------------------------------------------------------------------------  Chemistries  Recent Labs  Lab 07/07/22 0926  NA 137  K 4.4  CL 101  CO2 26  GLUCOSE 150*  BUN 17  CREATININE 1.00  CALCIUM 9.1  MG 2.3  AST 27  ALT 48*  ALKPHOS 81  BILITOT 1.1   ------------------------------------------------------------------------------------------------------------------ estimated creatinine clearance is 80.6 mL/min (by C-G formula based on SCr of 1 mg/dL). ------------------------------------------------------------------------------------------------------------------ No results for input(s): "TSH", "T4TOTAL", "T3FREE", "THYROIDAB" in the last 72 hours.  Invalid input(s): "FREET3"  Coagulation profile No results for input(s): "INR", "PROTIME" in the last 168 hours. ------------------------------------------------------------------------------------------------------------------- No results for input(s): "DDIMER" in the last 72 hours. -------------------------------------------------------------------------------------------------------------------  Cardiac Enzymes No results for input(s): "CKMB", "TROPONINI", "MYOGLOBIN" in the last 168 hours.  Invalid input(s): "CK" ------------------------------------------------------------------------------------------------------------------ No results found for:  "BNP"   ---------------------------------------------------------------------------------------------------------------  Urinalysis    Component Value Date/Time   COLORURINE YELLOW 07/07/2022 1230   APPEARANCEUR CLEAR 07/07/2022 1230   LABSPEC >1.046 (H) 07/07/2022 1230   PHURINE 8.0 07/07/2022 1230   GLUCOSEU >=500 (A) 07/07/2022 1230   HGBUR NEGATIVE 07/07/2022 1230   BILIRUBINUR NEGATIVE 07/07/2022 1230   KETONESUR NEGATIVE 07/07/2022 1230   PROTEINUR NEGATIVE 07/07/2022 1230   UROBILINOGEN 0.2 08/29/2012 1221   NITRITE NEGATIVE 07/07/2022 1230   LEUKOCYTESUR NEGATIVE 07/07/2022 1230    ----------------------------------------------------------------------------------------------------------------   Imaging Results:    CT ABDOMEN PELVIS W CONTRAST  Result Date: 07/07/2022 CLINICAL DATA:  Abdominal pain, nonlocalized. Epigastric pain. Nauseous. EXAM: CT ABDOMEN AND PELVIS WITH CONTRAST TECHNIQUE: Multidetector CT imaging of the abdomen and pelvis was performed using the standard protocol following bolus administration of intravenous contrast. RADIATION DOSE REDUCTION: This exam was performed according to the departmental dose-optimization program which includes automated exposure control, adjustment of the mA and/or kV according to patient size and/or use of iterative reconstruction technique. CONTRAST:  12m OMNIPAQUE IOHEXOL 300 MG/ML  SOLN COMPARISON:  None Available. FINDINGS: Lower chest: Dependent atelectasis bilaterally. Hepatobiliary: Subtle nodularity of liver contour (see right hepatic lobe on 36/2) raises the question of underlying cirrhosis. No suspicious focal abnormality within the liver parenchyma. There is no evidence for gallstones, gallbladder wall thickening, or pericholecystic fluid. No intrahepatic or extrahepatic biliary dilation. Pancreas: No focal mass lesion. No dilatation of the main duct. No intraparenchymal cyst. No peripancreatic edema. Spleen: Spleen  measures 17 cm craniocaudal length, enlarged. Adrenals/Urinary Tract: No adrenal nodule or mass. Kidneys unremarkable. No evidence for hydroureter. The urinary bladder appears normal for the degree of distention. Stomach/Bowel: Stomach is unremarkable. No gastric wall thickening. No evidence of outlet obstruction. Duodenum is normally positioned as is the ligament of Treitz. No small bowel wall thickening. No small bowel dilatation. The terminal ileum is normal. Appendix is mildly dilated up to 10 mm diameter without periappendiceal edema or inflammation to suggest acute appendicitis. No gross colonic mass. No colonic wall thickening. Diverticular changes are noted in the left colon without evidence of diverticulitis. Vascular/Lymphatic: There is moderate atherosclerotic calcification of the abdominal aorta without aneurysm. Mild lymphadenopathy noted in the hepatoduodenal ligament including 16 mm short axis lymph node on 28/2. No retroperitoneal lymphadenopathy. No pelvic sidewall lymphadenopathy. Reproductive: The prostate gland and seminal vesicles are unremarkable. Other: No intraperitoneal free fluid. Musculoskeletal: No worrisome lytic or sclerotic osseous abnormality. IMPRESSION: 1. No acute findings in the abdomen or pelvis. 2. Subtle nodularity of liver contour raises the question of underlying cirrhosis. 3. Splenomegaly. 4. Mild lymphadenopathy in the hepatoduodenal ligament, likely reactive. Consider follow-up CT abdomen in 3 months to ensure stability. 5. Left colonic diverticulosis without diverticulitis. 6.  Aortic Atherosclerosis (ICD10-I70.0). Electronically Signed   By: EMisty StanleyM.D.   On: 07/07/2022 11:20   DG Abd Acute W/Chest  Result Date: 07/07/2022 CLINICAL DATA:  Epigastric pain for 2 days. EXAM: DG ABDOMEN ACUTE WITH 1 VIEW CHEST COMPARISON:  None Available. FINDINGS: Heart size is normal. No pleural effusion or edema. No airspace opacities identified. There is gas and stool noted  throughout the colon up to the level of the rectum. There are few scattered prominent loops of small bowel which measure up to 3.3 cm. No air-fluid levels identified. No abnormal abdominal or pelvic calcifications. IMPRESSION: 1. No acute cardiopulmonary abnormalities. 2. No signs of high-grade bowel obstruction. 3. Few scattered prominent loops of small bowel are noted which may reflect mild ileus versus enteritis.  Low-grade partial obstruction not excluded. Electronically Signed   By: Kerby Moors M.D.   On: 07/07/2022 09:36     Vent. rate 78 BPM PR interval 167 ms QRS duration 92 ms QT/QTcB 411/469 ms P-R-T axes 47 60 18 Sinus rhythm Supraventricular bigeminy  Assessment & Plan:    Principal Problem:   Abdominal pain Active Problems:   Depression   Gastric ulcer   Type 2 diabetes mellitus with diabetic neuropathy, unspecified (HCC)   PVD (peripheral vascular disease) with claudication ([facility])  Abdominal pain -No fever, no leukocytosis, no lactic acid which is reassuring. -Discussed with radiology who reviewed imaging, no evidence of gastric ischemia sign, he denies any postprandial pain, it appears constant, [place] PUD pain he had 23 years ago. -I have discussed with radiology, who reviewed imaging, no evidence of inflamed appendix, it is mildly dilated and, but no signs of inflammation or infection, as well cynical evidence of appendicitis. -He will be kept n.p.o., on IV fluids, will start on Protonix drip, and Carafate. -Discussed with GI, likely will have endoscopy tomorrow.  Chronic back pain -Continue with home regimen of gabapentin and oxycodone (reviewed  BPMP site, he takes  oxycodone 20 mg oral up to 5 times daily.  Marland Kitchen  History  of PVD -Hold aspirin and Plavix till PUD is ruled out  Diabetes mellitus, type II -Will hold metformin, Jardiance and glipizide, meanwhile we will keep on insulin sliding scale  History of depression -Continue with Prozac and  Wellbutrin  Hypertension -Continue with hydrochlorothiazide and lisinopril  Hyperlipidemia -Continue with Zetia and rosuvastatin   DVT Prophylaxis SCDs >> will hold pharmacological DVT prophylaxis till PVD is ruled out.  AM Labs Ordered, also please review Full Orders  Family Communication: Admission, patients condition and plan of care including tests being ordered have been discussed with the patient  who indicate understanding and agree with the plan and Code Status.  Code Status Full  Likely DC to  home  Condition GUARDED    Consults called: GI    Admission status: observation    Time spent in minutes : 70 minutes   Phillips Climes M.D on 07/07/2022 at 7:35 PM   [facility] - Office  [phone]

## 2022-07-07 NOTE — ED Provider Notes (Signed)
[facility] EMERGENCY DEPARTMENT Provider Note   CSN: [id number] Arrival date & time: 07/07/22  0845     History  Chief Complaint  Patient presents with   Abdominal Pain    Jim [name] is a 66 y.o. male.   Abdominal Pain Associated symptoms: nausea   Patient presents for epigastric pain and nausea over the past 2 days.  Medical history includes PUD, arthritis, HTN, HLD, T2DM, PVD, and depression.  Since onset of the pain, he did try to eat once.  This did worsen the pain.  He has not eaten since then.  He has continued to drink water.  He has had ongoing nausea.  He had episodes of dry heaving this morning.  He states that his symptoms are reminiscent of a gastric ulcer which she had 25 years ago.  Although he was previously prescribed acid blocking medications, he longer takes this.  In the past 2 days, he did try Tums with no relief.  Current pain is 10/10 in severity.  It does not radiate.  Patient denies any associated shortness of breath.  Patient does currently smoke.  He drinks only occasionally.  He denies any use of NSAID medications.  His last bowel meant was yesterday.  He denies any melena.     Home Medications Prior to Admission medications   Medication Sig Start Date End Date Taking? Authorizing Provider  albuterol (VENTOLIN HFA) 108 (90 Base) MCG/ACT inhaler Inhale 2 puffs into the lungs every 6 (six) hours as needed for wheezing. 10/21/20   Kathyrn Drown, MD  aspirin EC 81 MG tablet Take 81 mg by mouth daily.    [provider]  blood glucose meter kit and supplies Dispense based on patient and insurance preference. Use to check glucose once daily as directed. (FOR ICD-10 E11.9). 05/05/20   Kathyrn Drown, MD  buPROPion (WELLBUTRIN XL) 150 MG 24 hr tablet Take 1 tablet (150 mg total) by mouth daily. 03/02/22   Kathyrn Drown, MD  clopidogrel (PLAVIX) 75 MG tablet Take 1 tablet (75 mg total) by mouth daily. 03/02/22   Kathyrn Drown, MD  diclofenac Sodium  (VOLTAREN) 1 % GEL SMARTSIG:2-4 Gram(s) Topical 4 Times Daily 04/19/22   [provider]  ezetimibe (ZETIA) 10 MG tablet Take 1 tablet (10 mg total) by mouth daily. 03/02/22   Kathyrn Drown, MD  FLUoxetine (PROZAC) 20 MG capsule Take 3 capsules (60 mg total) by mouth daily. 03/02/22   Kathyrn Drown, MD  glipiZIDE (GLUCOTROL XL) 2.5 MG 24 hr tablet 1 q am 03/02/22   Kathyrn Drown, MD  hydrochlorothiazide (MICROZIDE) 12.5 MG capsule TAKE 1 CAPSULE BY MOUTH EVERY MORNING AS NEEDED FOR LEG EDEMA 06/30/22   Kathyrn Drown, MD  JARDIANCE 10 MG TABS tablet TAKE 1 TABLET BY MOUTH DAILY BEFORE BREAKFAST. 06/30/22   Kathyrn Drown, MD  Lancets (ONETOUCH ULTRASOFT) lancets  05/06/20   [provider]  lisinopril (ZESTRIL) 5 MG tablet TAKE 1 TABLET BY MOUTH EVERY DAY 04/27/22   Kathyrn Drown, MD  metFORMIN (GLUCOPHAGE) 500 MG tablet 2 qam and 2qevening 03/02/22   Kathyrn Drown, MD  naloxone [facility]) nasal spray 4 mg/0.1 mL Use as directed 07/16/20   Kathyrn Drown, MD  [facility] VERIO test strip USE TO CHECK BLOOD SUGAR LEVELS ONCE DAILY 11/18/20   Kathyrn Drown, MD  OVER THE COUNTER MEDICATION One a day vitamin -takes 2 am and 2 pm  [provider]  Oxycodone HCl 20 MG TABS TK 1/2-1 T 3-5 TIMES DAILY IF TOLERATED 07/21/18   [provider]  rosuvastatin (CRESTOR) 5 MG tablet TAKE 1 TABLET ON MONDAY AND FRIDAY 11/30/21   Kathyrn Drown, MD  Semaglutide,0.25 or 0.5MG/DOS, (OZEMPIC, 0.25 OR 0.5 MG/DOSE,) 2 MG/1.5ML SOPN  05/28/20   [provider]  tamsulosin (FLOMAX) 0.4 MG CAPS capsule TAKE 1 CAPSULE BY MOUTH EVERY DAY 06/30/22   Kathyrn Drown, MD  traZODone (DESYREL) 50 MG tablet 1 qhs prn insomnia 03/02/22   Kathyrn Drown, MD      Allergies    Doxycycline and Pravastatin    Review of Systems   Review of Systems  Constitutional:  Positive for appetite change.  Gastrointestinal:  Positive for abdominal pain and nausea.  All other systems reviewed and  are negative.   Physical Exam Updated Vital Signs BP (!) 152/83 (BP Location: Right Arm)   Pulse 62   Temp 97.6 F (36.4 C) (Oral)   Resp 18   Ht _0  (1.727 m)   Wt 96.2 kg   SpO2 96%   BMI 32.23 kg/m  Physical Exam Vitals and nursing note reviewed.  Constitutional:      General: He is not in acute distress.    Appearance: He is well-developed. He is not ill-appearing, toxic-appearing or diaphoretic.  HENT:     Head: Normocephalic and atraumatic.     Mouth/Throat:     Pharynx: Oropharynx is clear.  Eyes:     General: No scleral icterus.    Conjunctiva/sclera: Conjunctivae normal.     Pupils: Pupils are equal, round, and reactive to light.  Cardiovascular:     Rate and Rhythm: Normal rate and regular rhythm.  Pulmonary:     Effort: Pulmonary effort is normal. No respiratory distress.  Abdominal:     Palpations: Abdomen is soft.     Tenderness: There is abdominal tenderness in the epigastric area. There is no guarding or rebound.  Musculoskeletal:        General: No swelling.     Cervical back: Neck supple.  Skin:    General: Skin is warm and dry.     Coloration: Skin is not jaundiced or pale.  Neurological:     General: No focal deficit present.     Mental Status: He is alert and oriented to person, place, and time.  Psychiatric:        Mood and Affect: Mood normal.        Behavior: Behavior normal.     ED Results / Procedures / Treatments   Labs (all labs ordered are listed, but only abnormal results are displayed) Labs Reviewed  COMPREHENSIVE METABOLIC PANEL - Abnormal; Notable for the following components:      Result Value   Glucose, Bld 150 (*)    ALT 48 (*)    All other components within normal limits  CBC WITH DIFFERENTIAL/PLATELET - Abnormal; Notable for the following components:   Hemoglobin 17.6 (*)    HCT 53.3 (*)    Platelets 105 (*)    All other components within normal limits  URINALYSIS, ROUTINE W REFLEX MICROSCOPIC - Abnormal; Notable  for the following components:   Specific Gravity, Urine >1.046 (*)    Glucose, UA >=500 (*)    All other components within normal limits  LIPASE, BLOOD  LACTIC ACID, PLASMA  MAGNESIUM  TROPONIN I (HIGH SENSITIVITY)  TROPONIN I (HIGH SENSITIVITY)    EKG EKG  Interpretation  Date/Time:  Wednesday July 07 2022 10:01:09 EST Ventricular Rate:  78 PR Interval:  167 QRS Duration: 92 QT Interval:  411 QTC Calculation: 469 R Axis:   60 Text Interpretation: Sinus rhythm Confirmed by Godfrey Pick [phone]) on 07/07/2022 4:24:32 PM  Radiology CT ABDOMEN PELVIS W CONTRAST  Result Date: 07/07/2022 CLINICAL DATA:  Abdominal pain, nonlocalized. Epigastric pain. Nauseous. EXAM: CT ABDOMEN AND PELVIS WITH CONTRAST TECHNIQUE: Multidetector CT imaging of the abdomen and pelvis was performed using the standard protocol following bolus administration of intravenous contrast. RADIATION DOSE REDUCTION: This exam was performed according to the departmental dose-optimization program which includes automated exposure control, adjustment of the mA and/or kV according to patient size and/or use of iterative reconstruction technique. CONTRAST:  144m OMNIPAQUE IOHEXOL 300 MG/ML  SOLN COMPARISON:  None Available. FINDINGS: Lower chest: Dependent atelectasis bilaterally. Hepatobiliary: Subtle nodularity of liver contour (see right hepatic lobe on 36/2) raises the question of underlying cirrhosis. No suspicious focal abnormality within the liver parenchyma. There is no evidence for gallstones, gallbladder wall thickening, or pericholecystic fluid. No intrahepatic or extrahepatic biliary dilation. Pancreas: No focal mass lesion. No dilatation of the main duct. No intraparenchymal cyst. No peripancreatic edema. Spleen: Spleen measures 17 cm craniocaudal length, enlarged. Adrenals/Urinary Tract: No adrenal nodule or mass. Kidneys unremarkable. No evidence for hydroureter. The urinary bladder appears normal for the degree of  distention. Stomach/Bowel: Stomach is unremarkable. No gastric wall thickening. No evidence of outlet obstruction. Duodenum is normally positioned as is the ligament of Treitz. No small bowel wall thickening. No small bowel dilatation. The terminal ileum is normal. Appendix is mildly dilated up to 10 mm diameter without periappendiceal edema or inflammation to suggest acute appendicitis. No gross colonic mass. No colonic wall thickening. Diverticular changes are noted in the left colon without evidence of diverticulitis. Vascular/Lymphatic: There is moderate atherosclerotic calcification of the abdominal aorta without aneurysm. Mild lymphadenopathy noted in the hepatoduodenal ligament including 16 mm short axis lymph node on 28/2. No retroperitoneal lymphadenopathy. No pelvic sidewall lymphadenopathy. Reproductive: The prostate gland and seminal vesicles are unremarkable. Other: No intraperitoneal free fluid. Musculoskeletal: No worrisome lytic or sclerotic osseous abnormality. IMPRESSION: 1. No acute findings in the abdomen or pelvis. 2. Subtle nodularity of liver contour raises the question of underlying cirrhosis. 3. Splenomegaly. 4. Mild lymphadenopathy in the hepatoduodenal ligament, likely reactive. Consider follow-up CT abdomen in 3 months to ensure stability. 5. Left colonic diverticulosis without diverticulitis. 6.  Aortic Atherosclerosis (ICD10-I70.0). Electronically Signed   By: EMisty StanleyM.D.   On: 07/07/2022 11:20   DG Abd Acute W/Chest  Result Date: 07/07/2022 CLINICAL DATA:  Epigastric pain for 2 days. EXAM: DG ABDOMEN ACUTE WITH 1 VIEW CHEST COMPARISON:  None Available. FINDINGS: Heart size is normal. No pleural effusion or edema. No airspace opacities identified. There is gas and stool noted throughout the colon up to the level of the rectum. There are few scattered prominent loops of small bowel which measure up to 3.3 cm. No air-fluid levels identified. No abnormal abdominal or pelvic  calcifications. IMPRESSION: 1. No acute cardiopulmonary abnormalities. 2. No signs of high-grade bowel obstruction. 3. Few scattered prominent loops of small bowel are noted which may reflect mild ileus versus enteritis. Low-grade partial obstruction not excluded. Electronically Signed   By: TKerby MoorsM.D.   On: 07/07/2022 09:36    Procedures Procedures    Medications Ordered in ED Medications  pantoprazole (PROTONIX) injection 40 mg (40 mg Intravenous Given 07/07/22 1609)  lactated ringers infusion ( Intravenous New Bag/Given 07/07/22 1622)  HYDROmorphone (DILAUDID) injection 0.5 mg (has no administration in time range)  fentaNYL (SUBLIMAZE) injection 50 mcg (50 mcg Intravenous Given 07/07/22 0937)  ondansetron (ZOFRAN) injection 4 mg (4 mg Intravenous Given 07/07/22 0937)  alum & mag hydroxide-simeth (MAALOX/MYLANTA) 200-200-20 MG/5ML suspension 30 mL (30 mLs Oral Given 07/07/22 0940)    And  lidocaine (XYLOCAINE) 2 % viscous mouth solution 15 mL (15 mLs Oral Given 07/07/22 0940)  famotidine (PEPCID) IVPB 20 mg premix (0 mg Intravenous Stopped 07/07/22 1100)  lactated ringers bolus 1,000 mL (0 mLs Intravenous Stopped 07/07/22 1233)  HYDROmorphone (DILAUDID) injection 0.5 mg (0.5 mg Intravenous Given 07/07/22 1034)  iohexol (OMNIPAQUE) 300 MG/ML solution 100 mL (100 mLs Intravenous Contrast Given 07/07/22 1057)  HYDROmorphone (DILAUDID) injection 1 mg (1 mg Intravenous Given 07/07/22 1312)  HYDROmorphone (DILAUDID) injection 0.5 mg (0.5 mg Intravenous Given 07/07/22 1618)    ED Course/ Medical Decision Making/ A&P                           Medical Decision Making Amount and/or Complexity of Data Reviewed Labs: ordered. Radiology: ordered.  Risk OTC drugs. Prescription drug management.   This patient presents to the ED for concern of epigastric pain, this involves an extensive number of treatment options, and is a complaint that carries with it a high risk of complications and  morbidity.  The differential diagnosis includes PUD, pancreatitis, ACS, AAA, dissection   Co morbidities that complicate the patient evaluation  PUD, arthritis, HTN, HLD, T2DM, PVD, and depression   Additional history obtained:  Additional history obtained from patient's wife External records from outside source obtained and reviewed including EMR   Lab Tests:  I Ordered, and personally interpreted labs.  The pertinent results include: Increased hemoglobin consistent with hemoconcentration.  Lab work otherwise unremarkable.   Imaging Studies ordered:  I ordered imaging studies including CT of abdomen pelvis I independently visualized and interpreted imaging which showed no acute findings I agree with the radiologist interpretation   Cardiac Monitoring: / EKG:  The patient was maintained on a cardiac monitor.  I personally viewed and interpreted the cardiac monitored which showed an underlying rhythm of: Sinus rhythm  Problem List / ED Course / Critical interventions / Medication management  Patient presents for epigastric pain and nausea.  He does have remote history of PUD.  He states that his symptoms are currently reminiscent of PUD episode 25 years ago.  Pain was worsened postprandially.  He has taken Tums with no relief.  Current pain is 10/10 in severity.  He does have current nausea.  He had driving this morning.  Given his poor p.o. intake, IV fluids were ordered.  Fentanyl was ordered for analgesia.  Zofran was ordered for nausea.  Will also treat with GI cocktail.  Laboratory workup was initiated.  Will also check EKG and troponins.  Lab work is reassuring.  Hemoglobin is increased concerning for hemoconcentration.  Troponins are normal.  Patient underwent CT imaging of abdomen pelvis.  This 2 did not show any acute findings.  I suspect PUD.  Patient reported no relief in symptoms with GI cocktail.  He did have some relief in symptoms with Dilaudid.  After several rounds of  Dilaudid, he was given water to drink.  While drinking water, he had recurrence of severe pain.  Given his uncontrolled pain and p.o. intolerance, patient to be admitted to hospitalist.  I did speak with hospitalist who stated that he will come evaluate the patient prior to admission.  Care of patient was signed out to oncoming ED provider. I ordered medication including fentanyl and Dilaudid for analgesia; IV fluids for hydration; GI cocktail and Protonix for empiric treatment of PUD Reevaluation of the patient after these medicines showed that the patient improved I have reviewed the patients home medicines and have made adjustments as needed   Social Determinants of Health:  Has PCP        Final Clinical Impression(s) / ED Diagnoses Final diagnoses:  Epigastric pain    Rx / DC Orders ED Discharge Orders     None         Godfrey Pick, MD 07/07/22 1626

## 2022-07-08 ENCOUNTER — Observation Stay (HOSPITAL_COMMUNITY): Payer: Medicare Other

## 2022-07-08 ENCOUNTER — Encounter (HOSPITAL_COMMUNITY): Payer: Self-pay | Admitting: Internal Medicine

## 2022-07-08 ENCOUNTER — Inpatient Hospital Stay (HOSPITAL_COMMUNITY): Payer: Medicare Other | Admitting: Anesthesiology

## 2022-07-08 ENCOUNTER — Encounter (HOSPITAL_COMMUNITY)
Admission: EM | Disposition: A | Discharge status: Home / Self Care | Payer: Self-pay | Source: Home / Self Care | Attending: Family Medicine

## 2022-07-08 DIAGNOSIS — K3189 Other diseases of stomach and duodenum: Secondary | ICD-10-CM

## 2022-07-08 DIAGNOSIS — F32A Depression, unspecified: Secondary | ICD-10-CM | POA: Diagnosis present

## 2022-07-08 DIAGNOSIS — Z8249 Family history of ischemic heart disease and other diseases of the circulatory system: Secondary | ICD-10-CM | POA: Diagnosis not present

## 2022-07-08 DIAGNOSIS — R1314 Dysphagia, pharyngoesophageal phase: Secondary | ICD-10-CM | POA: Diagnosis present

## 2022-07-08 DIAGNOSIS — Z833 Family history of diabetes mellitus: Secondary | ICD-10-CM | POA: Diagnosis not present

## 2022-07-08 DIAGNOSIS — F1120 Opioid dependence, uncomplicated: Secondary | ICD-10-CM | POA: Diagnosis present

## 2022-07-08 DIAGNOSIS — R1319 Other dysphagia: Secondary | ICD-10-CM | POA: Diagnosis not present

## 2022-07-08 DIAGNOSIS — R1013 Epigastric pain: Secondary | ICD-10-CM

## 2022-07-08 DIAGNOSIS — M549 Dorsalgia, unspecified: Secondary | ICD-10-CM | POA: Diagnosis present

## 2022-07-08 DIAGNOSIS — Z7982 Long term (current) use of aspirin: Secondary | ICD-10-CM | POA: Diagnosis not present

## 2022-07-08 DIAGNOSIS — K7460 Unspecified cirrhosis of liver: Secondary | ICD-10-CM | POA: Diagnosis not present

## 2022-07-08 DIAGNOSIS — I8500 Esophageal varices without bleeding: Secondary | ICD-10-CM

## 2022-07-08 DIAGNOSIS — Z96653 Presence of artificial knee joint, bilateral: Secondary | ICD-10-CM | POA: Diagnosis present

## 2022-07-08 DIAGNOSIS — E1151 Type 2 diabetes mellitus with diabetic peripheral angiopathy without gangrene: Secondary | ICD-10-CM | POA: Diagnosis not present

## 2022-07-08 DIAGNOSIS — I8510 Secondary esophageal varices without bleeding: Secondary | ICD-10-CM | POA: Diagnosis present

## 2022-07-08 DIAGNOSIS — G894 Chronic pain syndrome: Secondary | ICD-10-CM | POA: Diagnosis present

## 2022-07-08 DIAGNOSIS — K2950 Unspecified chronic gastritis without bleeding: Secondary | ICD-10-CM | POA: Diagnosis not present

## 2022-07-08 DIAGNOSIS — R1310 Dysphagia, unspecified: Secondary | ICD-10-CM

## 2022-07-08 DIAGNOSIS — Z79899 Other long term (current) drug therapy: Secondary | ICD-10-CM | POA: Diagnosis not present

## 2022-07-08 DIAGNOSIS — I10 Essential (primary) hypertension: Secondary | ICD-10-CM

## 2022-07-08 DIAGNOSIS — E1140 Type 2 diabetes mellitus with diabetic neuropathy, unspecified: Secondary | ICD-10-CM | POA: Diagnosis not present

## 2022-07-08 DIAGNOSIS — Z87891 Personal history of nicotine dependence: Secondary | ICD-10-CM

## 2022-07-08 DIAGNOSIS — E785 Hyperlipidemia, unspecified: Secondary | ICD-10-CM | POA: Diagnosis present

## 2022-07-08 DIAGNOSIS — K2970 Gastritis, unspecified, without bleeding: Secondary | ICD-10-CM | POA: Diagnosis present

## 2022-07-08 DIAGNOSIS — Z8711 Personal history of peptic ulcer disease: Secondary | ICD-10-CM | POA: Diagnosis not present

## 2022-07-08 DIAGNOSIS — Z7902 Long term (current) use of antithrombotics/antiplatelets: Secondary | ICD-10-CM | POA: Diagnosis not present

## 2022-07-08 DIAGNOSIS — Z888 Allergy status to other drugs, medicaments and biological substances status: Secondary | ICD-10-CM | POA: Diagnosis not present

## 2022-07-08 DIAGNOSIS — R109 Unspecified abdominal pain: Secondary | ICD-10-CM | POA: Diagnosis not present

## 2022-07-08 DIAGNOSIS — Z881 Allergy status to other antibiotic agents status: Secondary | ICD-10-CM | POA: Diagnosis not present

## 2022-07-08 DIAGNOSIS — Z809 Family history of malignant neoplasm, unspecified: Secondary | ICD-10-CM | POA: Diagnosis not present

## 2022-07-08 DIAGNOSIS — E669 Obesity, unspecified: Secondary | ICD-10-CM | POA: Diagnosis present

## 2022-07-08 DIAGNOSIS — R079 Chest pain, unspecified: Secondary | ICD-10-CM | POA: Diagnosis not present

## 2022-07-08 HISTORY — PX: ESOPHAGOGASTRODUODENOSCOPY (EGD) WITH PROPOFOL: SHX5813

## 2022-07-08 HISTORY — PX: ESOPHAGEAL DILATION: SHX303

## 2022-07-08 HISTORY — PX: BIOPSY: SHX5522

## 2022-07-08 LAB — CBC
HCT: 46.9 % (ref 39.0–52.0)
Hemoglobin: 15.5 g/dL (ref 13.0–17.0)
MCH: 31.8 pg (ref 26.0–34.0)
MCHC: 33 g/dL (ref 30.0–36.0)
MCV: 96.3 fL (ref 80.0–100.0)
Platelets: 82 10*3/uL — ABNORMAL LOW (ref 150–400)
RBC: 4.87 MIL/uL (ref 4.22–5.81)
RDW: 14.3 % (ref 11.5–15.5)
WBC: 5.1 10*3/uL (ref 4.0–10.5)
nRBC: 0 % (ref 0.0–0.2)

## 2022-07-08 LAB — GLUCOSE, CAPILLARY
Glucose-Capillary: 102 mg/dL — ABNORMAL HIGH (ref 70–99)
Glucose-Capillary: 117 mg/dL — ABNORMAL HIGH (ref 70–99)
Glucose-Capillary: 137 mg/dL — ABNORMAL HIGH (ref 70–99)
Glucose-Capillary: 146 mg/dL — ABNORMAL HIGH (ref 70–99)
Glucose-Capillary: 99 mg/dL (ref 70–99)

## 2022-07-08 LAB — COMPREHENSIVE METABOLIC PANEL
ALT: 39 U/L (ref 0–44)
AST: 22 U/L (ref 15–41)
Albumin: 3.7 g/dL (ref 3.5–5.0)
Alkaline Phosphatase: 67 U/L (ref 38–126)
Anion gap: 5 (ref 5–15)
BUN: 15 mg/dL (ref 8–23)
CO2: 26 mmol/L (ref 22–32)
Calcium: 8.3 mg/dL — ABNORMAL LOW (ref 8.9–10.3)
Chloride: 102 mmol/L (ref 98–111)
Creatinine, Ser: 0.89 mg/dL (ref 0.61–1.24)
GFR, Estimated: >60 mL/min (ref >60)
Glucose, Bld: 135 mg/dL — ABNORMAL HIGH (ref 70–99)
Potassium: 3.9 mmol/L (ref 3.5–5.1)
Sodium: 133 mmol/L — ABNORMAL LOW (ref 135–145)
Total Bilirubin: 0.8 mg/dL (ref 0.3–1.2)
Total Protein: 6.4 g/dL — ABNORMAL LOW (ref 6.5–8.1)

## 2022-07-08 LAB — CBG MONITORING, ED
Glucose-Capillary: 141 mg/dL — ABNORMAL HIGH (ref 70–99)
Glucose-Capillary: 124 mg/dL — ABNORMAL HIGH (ref 70–99)

## 2022-07-08 LAB — HIV ANTIBODY (ROUTINE TESTING W REFLEX): HIV Screen 4th Generation wRfx: NONREACTIVE

## 2022-07-08 SURGERY — ESOPHAGOGASTRODUODENOSCOPY (EGD) WITH PROPOFOL
Anesthesia: General

## 2022-07-08 MED ORDER — IPRATROPIUM-ALBUTEROL 0.5-2.5 (3) MG/3ML IN SOLN
RESPIRATORY_TRACT | Status: AC
  Filled 2022-07-08: qty 3

## 2022-07-08 MED ORDER — LACTATED RINGERS IV SOLN
INTRAVENOUS | Status: DC
  Administered 2022-07-08: 1000 mL via INTRAVENOUS

## 2022-07-08 MED ORDER — ASPIRIN 81 MG PO TBEC
81.0000 mg | DELAYED_RELEASE_TABLET | Freq: Every day | ORAL | Status: DC
  Administered 2022-07-09: 81 mg via ORAL
  Filled 2022-07-08: qty 1

## 2022-07-08 MED ORDER — PROPOFOL 10 MG/ML IV BOLUS
INTRAVENOUS | Status: DC | PRN
  Administered 2022-07-08: 100 mg via INTRAVENOUS

## 2022-07-08 MED ORDER — PHENYLEPHRINE HCL (PRESSORS) 10 MG/ML IV SOLN
INTRAVENOUS | Status: DC | PRN
  Administered 2022-07-08: 240 ug via INTRAVENOUS
  Administered 2022-07-08: 160 ug via INTRAVENOUS

## 2022-07-08 MED ORDER — LIDOCAINE HCL (CARDIAC) PF 100 MG/5ML IV SOSY
PREFILLED_SYRINGE | INTRAVENOUS | Status: DC | PRN
  Administered 2022-07-08: 100 mg via INTRAVENOUS

## 2022-07-08 MED ORDER — IPRATROPIUM-ALBUTEROL 0.5-2.5 (3) MG/3ML IN SOLN
3.0000 mL | Freq: Once | RESPIRATORY_TRACT | Status: AC
  Administered 2022-07-08: 3 mL via RESPIRATORY_TRACT

## 2022-07-08 MED ORDER — SODIUM CHLORIDE 0.9 % IV SOLN: INTRAVENOUS | Status: DC

## 2022-07-08 MED ORDER — LACTATED RINGERS IV SOLN: INTRAVENOUS | Status: DC | PRN

## 2022-07-08 MED ORDER — CLOPIDOGREL BISULFATE 75 MG PO TABS
75.0000 mg | ORAL_TABLET | Freq: Every day | ORAL | Status: DC
  Administered 2022-07-09: 75 mg via ORAL
  Filled 2022-07-08: qty 1

## 2022-07-08 MED ORDER — FENTANYL CITRATE (PF) 100 MCG/2ML IJ SOLN
100.0000 ug | Freq: Once | INTRAMUSCULAR | Status: AC
  Administered 2022-07-08: 100 ug via INTRAVENOUS
  Filled 2022-07-08: qty 2

## 2022-07-08 MED ORDER — HYDROMORPHONE HCL 1 MG/ML IJ SOLN
1.0000 mg | INTRAMUSCULAR | Status: DC | PRN
  Administered 2022-07-08 – 2022-07-09 (×6): 1 mg via INTRAVENOUS
  Filled 2022-07-08 (×6): qty 1

## 2022-07-08 NOTE — Anesthesia Postprocedure Evaluation (Signed)
Anesthesia Post Note  Patient: Jim [name]  Procedure(s) Performed: ESOPHAGOGASTRODUODENOSCOPY (EGD) WITH PROPOFOL ESOPHAGEAL DILATION BIOPSY  Patient location during evaluation: Phase II Anesthesia Type: General Level of consciousness: awake and alert and oriented Pain management: pain level controlled Vital Signs Assessment: post-procedure vital signs reviewed and stable Respiratory status: spontaneous breathing, nonlabored ventilation and respiratory function stable Cardiovascular status: blood pressure returned to baseline and stable Postop Assessment: no apparent nausea or vomiting Anesthetic complications: no  No notable events documented.   Last Vitals:  Vitals:   07/08/22 1425 07/08/22 1430  BP: (!) 97/58 98/60  Pulse:    Resp: 15 20  Temp: 36.7 C   SpO2: 98% 95%    Last Pain:  Vitals:   07/08/22 1425  TempSrc:   PainSc: 0-No pain                 Vestal Markin C Virgene Tirone

## 2022-07-08 NOTE — Progress Notes (Signed)
PROGRESS NOTE   Jim [name]  BZJ:[id number] DOB: January 22, 1956 DOA: 07/07/2022 PCP: [name], DO   Chief Complaint  Patient presents with   Abdominal Pain   Level of care: Med-Surg  Brief Admission History:  66 y.o. male, with past medical history of arthritis, hypertension, hyperlipidemia, diabetes mellitus, type II, peripheral vascular disease, depression, chronic pain syndrome, history of PUD in the past, patient presents to ED secondary to complaints of abdominal pain, patient reports pain developed over last 48 hours, sudden onset, denies any relieving or provoking factors, denies any postprandial pain, reports this pain resembles gastric ulcer pain he had 25 years ago, [place] being any more on any acid blocking medicines, he did try Tums, with no relief, denies any nausea, or vomiting, but reports unable to eat and oral intake due to pain, he denies any NSAID take, movement was yesterday, no melena, no coffee-ground emesis -In ED CT abdomen pelvis with no acute findings, lipase within normal limit, pain is intractable, unable to tolerate any oral intake, requiring IV pain medicine for pain control, so [facility] hospitalist consulted to admit.   Assessment and Plan:  Abdominal Pain  - pain seems out of proportion to abdominal exam - reports pain feels similar to prior gastric ulcer - EGD today with GI service  - IV protonix ordered   Chronic back pain/opioid dependence  - IV pain medication in addition to oral pain meds for moderate pain  PVD - he is on plavix and aspirin  Essential hypertension  - GI recommended stop HCTZ and start carvedilol 3.125 mg BID given new findings of esophageal varices   Type 2 Diabetes Mellitus  - continue SSI coverage and CBG monitoring   Depression  - remains on fluoxetine and bupropion   Hyperlipidemia - remains on rosuvastatin and ezetimibe    DVT prophylaxis: SCDS Code Status: full  Family Communication: wife at bedside  12/7 Disposition: Status is: Inpatient Remains inpatient appropriate because: IV pantoprazole per GI    Consultants:  GI service  Procedures:  EGD: gastritis, grade 1 and grade 2 varices  Antimicrobials:    Subjective: Pt complains of severe pain in the abdomen.    Objective: Vitals:   07/08/22 1100 07/08/22 1329 07/08/22 1425 07/08/22 1430  BP: 101/61 105/65 (!) 97/58 98/60  Pulse: 66 65    Resp:  '18 15 20  '$ Temp: 97.9 F (36.6 C) 98 F (36.7 C) 98.1 F (36.7 C)   TempSrc: Oral Oral    SpO2: 96% 92% 98% 95%  Weight:  96.2 kg    Height:  '5\' 8"'$  (1.727 m)      Intake/Output Summary (Last 24 hours) at 07/08/2022 1602 Last data filed at 07/08/2022 1500 Gross per 24 hour  Intake 3620.62 ml  Output --  Net 3620.62 ml   Filed Weights   07/07/22 0857 07/08/22 1329  Weight: 96.2 kg 96.2 kg   Examination:  General exam: Appears calm and comfortable  Respiratory system: Clear to auscultation. Respiratory effort normal. Cardiovascular system: normal S1 & S2 heard. No JVD, murmurs, rubs, gallops or clicks. No pedal edema. Gastrointestinal system: Abdomen is nondistended, soft and nontender. No organomegaly or masses felt. Normal bowel sounds heard. Central nervous system: Alert and oriented. No focal neurological deficits. Extremities: Symmetric 5 x 5 power. Skin: No rashes, lesions or ulcers. Psychiatry: Judgement and insight appear normal. Mood & affect appropriate.   Data Reviewed: I have personally reviewed following labs and imaging studies  CBC: Recent Labs  Lab 07/07/22 0926 07/08/22 0503  WBC 6.0 5.1  NEUTROABS 4.3  --   HGB 17.6* 15.5  HCT 53.3* 46.9  MCV 95.7 96.3  PLT 105* 82*    Basic Metabolic Panel: Recent Labs  Lab 07/07/22 0926 07/08/22 0503  NA 137 133*  K 4.4 3.9  CL 101 102  CO2 26 26  GLUCOSE 150* 135*  BUN 17 15  CREATININE 1.00 0.89  CALCIUM 9.1 8.3*  MG 2.3  --     CBG: Recent Labs  Lab 07/07/22 2351 07/08/22 0413  07/08/22 0739 07/08/22 1121 07/08/22 1341  GLUCAP 130* 141* 124* 117* 102*    No results found for this or any previous visit (from the past 240 hour(s)).   Radiology Studies: DG Abd 1 View  Result Date: 07/08/2022 CLINICAL DATA:  66 year old male with abdominal pain. EXAM: ABDOMEN - 1 VIEW COMPARISON:  CT Abdomen and Pelvis yesterday. FINDINGS: Portable AP supine views at 0521 hours. Bowel-gas pattern remains non obstructed. Lung bases appear stable, negative. No pneumoperitoneum evident on these supine views. Stable visualized osseous structures. IMPRESSION: Non obstructed bowel gas pattern. Electronically Signed   By: Genevie Ann M.D.   On: 07/08/2022 05:45   CT ABDOMEN PELVIS W CONTRAST  Result Date: 07/07/2022 CLINICAL DATA:  Abdominal pain, nonlocalized. Epigastric pain. Nauseous. EXAM: CT ABDOMEN AND PELVIS WITH CONTRAST TECHNIQUE: Multidetector CT imaging of the abdomen and pelvis was performed using the standard protocol following bolus administration of intravenous contrast. RADIATION DOSE REDUCTION: This exam was performed according to the departmental dose-optimization program which includes automated exposure control, adjustment of the mA and/or kV according to patient size and/or use of iterative reconstruction technique. CONTRAST:  126m OMNIPAQUE IOHEXOL 300 MG/ML  SOLN COMPARISON:  None Available. FINDINGS: Lower chest: Dependent atelectasis bilaterally. Hepatobiliary: Subtle nodularity of liver contour (see right hepatic lobe on 36/2) raises the question of underlying cirrhosis. No suspicious focal abnormality within the liver parenchyma. There is no evidence for gallstones, gallbladder wall thickening, or pericholecystic fluid. No intrahepatic or extrahepatic biliary dilation. Pancreas: No focal mass lesion. No dilatation of the main duct. No intraparenchymal cyst. No peripancreatic edema. Spleen: Spleen measures 17 cm craniocaudal length, enlarged. Adrenals/Urinary Tract: No adrenal  nodule or mass. Kidneys unremarkable. No evidence for hydroureter. The urinary bladder appears normal for the degree of distention. Stomach/Bowel: Stomach is unremarkable. No gastric wall thickening. No evidence of outlet obstruction. Duodenum is normally positioned as is the ligament of Treitz. No small bowel wall thickening. No small bowel dilatation. The terminal ileum is normal. Appendix is mildly dilated up to 10 mm diameter without periappendiceal edema or inflammation to suggest acute appendicitis. No gross colonic mass. No colonic wall thickening. Diverticular changes are noted in the left colon without evidence of diverticulitis. Vascular/Lymphatic: There is moderate atherosclerotic calcification of the abdominal aorta without aneurysm. Mild lymphadenopathy noted in the hepatoduodenal ligament including 16 mm short axis lymph node on 28/2. No retroperitoneal lymphadenopathy. No pelvic sidewall lymphadenopathy. Reproductive: The prostate gland and seminal vesicles are unremarkable. Other: No intraperitoneal free fluid. Musculoskeletal: No worrisome lytic or sclerotic osseous abnormality. IMPRESSION: 1. No acute findings in the abdomen or pelvis. 2. Subtle nodularity of liver contour raises the question of underlying cirrhosis. 3. Splenomegaly. 4. Mild lymphadenopathy in the hepatoduodenal ligament, likely reactive. Consider follow-up CT abdomen in 3 months to ensure stability. 5. Left colonic diverticulosis without diverticulitis. 6.  Aortic Atherosclerosis (ICD10-I70.0). Electronically Signed   By: EMisty StanleyM.D.   On: 07/07/2022  11:20   DG Abd Acute W/Chest  Result Date: 07/07/2022 CLINICAL DATA:  Epigastric pain for 2 days. EXAM: DG ABDOMEN ACUTE WITH 1 VIEW CHEST COMPARISON:  None Available. FINDINGS: Heart size is normal. No pleural effusion or edema. No airspace opacities identified. There is gas and stool noted throughout the colon up to the level of the rectum. There are few scattered  prominent loops of small bowel which measure up to 3.3 cm. No air-fluid levels identified. No abnormal abdominal or pelvic calcifications. IMPRESSION: 1. No acute cardiopulmonary abnormalities. 2. No signs of high-grade bowel obstruction. 3. Few scattered prominent loops of small bowel are noted which may reflect mild ileus versus enteritis. Low-grade partial obstruction not excluded. Electronically Signed   By: Kerby Moors M.D.   On: 07/07/2022 09:36    Scheduled Meds:  [START ON 07/09/2022] aspirin EC  81 mg Oral Daily   buPROPion  150 mg Oral Daily   [START ON 07/09/2022] clopidogrel  75 mg Oral Daily   ezetimibe  10 mg Oral Daily   FLUoxetine  60 mg Oral Daily   gabapentin  100 mg Oral BID   insulin aspart  0-9 Units Subcutaneous Q4H   ipratropium-albuterol       lisinopril  5 mg Oral Daily   [START ON 07/11/2022] pantoprazole  40 mg Intravenous Q12H   [START ON 07/09/2022] rosuvastatin  5 mg Oral Once per day on Mon Fri   sucralfate  1 g Oral Q6H   tamsulosin  0.4 mg Oral Daily   Continuous Infusions:  sodium chloride 75 mL/hr at 07/08/22 1546   pantoprazole 8 mg/hr (07/08/22 0843)     LOS: 0 days   Time spent: 36 mins  Kalven Ganim Wynetta Emery, MD How to contact the [facility] Psychiatric Attending or Consulting provider [place] or covering provider during after hours 7P -7A, for this patient?  Check the care team in [facility] and look for a) attending/consulting [facility] provider listed and b) the [facility] team listed Log into [email or web address] and use [place]'s universal password to access. If you do not have the password, please contact the hospital operator. Locate the [facility] provider you are looking for under [facility] and page to a number that you can be directly reached. If you still have difficulty reaching the provider, please page the [facility] (Director on Call) for the Hospitalists listed on amion for assistance.  07/08/2022, 4:02 PM

## 2022-07-08 NOTE — Consult Note (Signed)
Gastroenterology Consult   Referring Provider: No ref. provider found Primary Care Physician:  [name], DO Primary Gastroenterologist:  [name], MD Patient ID: Jim [name]; [id number]; March 29, 1956   Admit date: 07/07/2022  LOS: 0 days   Date of Consultation: 07/08/2022  Reason for Consultation:  abdominal pain    History of Present Illness   Jim [name] is a 66 y.o. male with past medical history significant for arthritis, hypertension, hyperlipidemia, diabetes mellitus, PVD, chronic pain syndrome, history of remote esophageal ulcer disease presenting to the ED with complaints of abdominal pain.  Patient complains of pain underneath the lower sternum for 48 hours. Pain resembles what he experienced 25 years ago when he had an esophageal ulcer.  No relief with Tums. He has had some dry heaves, only secretions coming up. Unable to eat due to the pain. Has noted dysphagia for two weeks, feeling of solid food sticking in the lower chest. He has not noted any pill dysphagia but he took medication just prior to my entering his room and having trouble washing down, feels like it is in the back of his throat. Denies NSAIDs. Generally BMs regular. No melena or rectal bleeding. Has a Cologuard to complete at home, never came back for surveillance colonoscopy in 2016. History of sleep apnea, does not use his CPAP. Remote history of heavy etoh use in [phone].   In the ED: AST elevated at 48 (appears chronic), lipase 35, hemoglobin 17.6, white blood cell count 6000, platelets 105,000.  Today LFTs normal, hemoglobin 15.5, platelets 82,000.  CT abdomen pelvis with contrast showing subtle nodularity of the liver contour raising question of possible underlying cirrhosis, splenomegaly, mild lymphadenopathy in the hepatoduodenal ligament, likely reactive, consider follow-up CT in 3 months to ensure stability, mildly dilated appendix up to 10 mm in diameter without  periappendiceal edema or inflammation to suggest acute appendicitis.  Colonoscopy 2013: Multiple adenomatous colon polyps removed.  Advised to have a colonoscopy in 3 years.  EGD 2002: Esophageal ulcer versus tear   Prior to Admission medications   Medication Sig Start Date End Date Taking? Authorizing Provider  acetaminophen (TYLENOL) 650 MG CR tablet Take 1,300 mg by mouth daily as needed for pain.   Yes [provider]  albuterol (VENTOLIN HFA) 108 (90 Base) MCG/ACT inhaler Inhale 2 puffs into the lungs every 6 (six) hours as needed for wheezing. 10/21/20  Yes Kathyrn Drown, MD  aspirin EC 81 MG tablet Take 81 mg by mouth daily.   Yes [provider]  buPROPion (WELLBUTRIN XL) 150 MG 24 hr tablet Take 1 tablet (150 mg total) by mouth daily. 03/02/22  Yes Kathyrn Drown, MD  clopidogrel (PLAVIX) 75 MG tablet Take 1 tablet (75 mg total) by mouth daily. 03/02/22  Yes Kathyrn Drown, MD  diclofenac Sodium (VOLTAREN) 1 % GEL Apply 2-4 g topically 4 (four) times daily. 04/19/22  Yes [provider]  ezetimibe (ZETIA) 10 MG tablet Take 1 tablet (10 mg total) by mouth daily. 03/02/22  Yes Kathyrn Drown, MD  FLUoxetine (PROZAC) 20 MG capsule Take 3 capsules (60 mg total) by mouth daily. 03/02/22  Yes Luking, Elayne Snare, MD  gabapentin (NEURONTIN) 100 MG capsule Take 100 mg by mouth 2 (two) times daily. 06/21/22  Yes [provider]  glipiZIDE (GLUCOTROL XL) 2.5 MG 24 hr tablet 1 q am 03/02/22  Yes Luking, Scott A, MD  hydrochlorothiazide (MICROZIDE) 12.5 MG capsule TAKE 1 CAPSULE BY  MOUTH EVERY MORNING AS NEEDED FOR LEG EDEMA Patient taking differently: Take 12.5 mg by mouth See admin instructions. TAKE 1 CAPSULE BY MOUTH EVERY MORNING AS NEEDED FOR LEG EDEMA 06/30/22  Yes Luking, Scott A, MD  JARDIANCE 10 MG TABS tablet TAKE 1 TABLET BY MOUTH DAILY BEFORE BREAKFAST. 06/30/22  Yes Luking, Scott A, MD  lisinopril (ZESTRIL) 5 MG tablet TAKE 1 TABLET BY MOUTH EVERY DAY 04/27/22   Yes Kathyrn Drown, MD  metFORMIN (GLUCOPHAGE) 500 MG tablet 2 qam and 2qevening 03/02/22  Yes Luking, Elayne Snare, MD  naloxone [facility]) nasal spray 4 mg/0.1 mL Use as directed 07/16/20  Yes Luking, Elayne Snare, MD  OVER THE COUNTER MEDICATION Take 1 tablet by mouth daily.   Yes [provider]  Oxycodone HCl 20 MG TABS Take 1 tablet by mouth See admin instructions. Take 1 tablet by mouth 4-5 times daily if tolerated 07/21/18  Yes [provider]  rosuvastatin (CRESTOR) 5 MG tablet TAKE 1 TABLET ON MONDAY AND FRIDAY 11/30/21  Yes Kathyrn Drown, MD  tamsulosin (FLOMAX) 0.4 MG CAPS capsule TAKE 1 CAPSULE BY MOUTH EVERY DAY 06/30/22  Yes Kathyrn Drown, MD  traZODone (DESYREL) 50 MG tablet 1 qhs prn insomnia 03/02/22  Yes Luking, Elayne Snare, MD  blood glucose meter kit and supplies Dispense based on patient and insurance preference. Use to check glucose once daily as directed. (FOR ICD-10 E11.9). 05/05/20   Kathyrn Drown, MD  Lancets (ONETOUCH ULTRASOFT) lancets  05/06/20   [provider]  [facility] VERIO test strip USE TO CHECK BLOOD SUGAR LEVELS ONCE DAILY 11/18/20   Kathyrn Drown, MD    Current Facility-Administered Medications  Medication Dose Route Frequency Provider Last Rate Last Admin   0.9 %  sodium chloride infusion   Intravenous Continuous Elgergawy, Silver Huguenin, MD 75 mL/hr at 07/08/22 0028 Restarted at 07/08/22 0028   albuterol (PROVENTIL) (2.5 MG/3ML) 0.083% nebulizer solution 2.5 mg  2.5 mg Nebulization Q2H PRN Elgergawy, Silver Huguenin, MD       buPROPion (WELLBUTRIN XL) 24 hr tablet 150 mg  150 mg Oral Daily Elgergawy, Silver Huguenin, MD       ezetimibe (ZETIA) tablet 10 mg  10 mg Oral Daily Elgergawy, Silver Huguenin, MD       FLUoxetine (PROZAC) capsule 60 mg  60 mg Oral Daily Elgergawy, Silver Huguenin, MD       gabapentin (NEURONTIN) capsule 100 mg  100 mg Oral BID Elgergawy, Silver Huguenin, MD   100 mg at 07/07/22 2251   hydrALAZINE (APRESOLINE) injection 5 mg  5 mg Intravenous Q4H PRN  Elgergawy, Silver Huguenin, MD       hydrochlorothiazide (MICROZIDE) capsule 12.5 mg  12.5 mg Oral Daily PRN Elgergawy, Silver Huguenin, MD       HYDROmorphone (DILAUDID) injection 0.5 mg  0.5 mg Intravenous Q3H PRN Elgergawy, Silver Huguenin, MD   0.5 mg at 07/08/22 0326   insulin aspart (novoLOG) injection 0-9 Units  0-9 Units Subcutaneous Q4H Elgergawy, Silver Huguenin, MD   1 Units at 07/08/22 8416   lactated ringers infusion   Intravenous Continuous Elgergawy, Silver Huguenin, MD 125 mL/hr at 07/08/22 0119 New Bag at 07/08/22 0119   lisinopril (ZESTRIL) tablet 5 mg  5 mg Oral Daily Elgergawy, Silver Huguenin, MD   5 mg at 07/07/22 2250   naloxone (NARCAN) injection 0.4 mg  0.4 mg Intravenous PRN Elgergawy, Silver Huguenin, MD       ondansetron (ZOFRAN) tablet 4 mg  4 mg Oral Q6H PRN Elgergawy, Silver Huguenin, MD       Or   ondansetron (ZOFRAN) injection 4 mg  4 mg Intravenous Q6H PRN Elgergawy, Silver Huguenin, MD       oxyCODONE (Oxy IR/ROXICODONE) immediate release tablet 20 mg  20 mg Oral Q5H PRN Elgergawy, Silver Huguenin, MD   20 mg at 07/08/22 0153   pantoprazole (PROTONIX) injection 40 mg  40 mg Intravenous Q12H Elgergawy, Silver Huguenin, MD   40 mg at 07/07/22 2251   [START ON 07/11/2022] pantoprazole (PROTONIX) injection 40 mg  40 mg Intravenous Q12H Elgergawy, Silver Huguenin, MD       pantoprozole (PROTONIX) 80 mg /NS 100 mL infusion  8 mg/hr Intravenous Continuous Elgergawy, Silver Huguenin, MD 10 mL/hr at 07/08/22 0712 8 mg/hr at 07/08/22 0712   [START ON 07/09/2022] rosuvastatin (CRESTOR) tablet 5 mg  5 mg Oral Once per day on Mon Fri Elgergawy, Silver Huguenin, MD       sucralfate (CARAFATE) 1 GM/10ML suspension 1 g  1 g Oral Q6H Elgergawy, Silver Huguenin, MD   1 g at 07/08/22 0513   tamsulosin (FLOMAX) capsule 0.4 mg  0.4 mg Oral Daily Elgergawy, Silver Huguenin, MD   0.4 mg at 07/07/22 2251   traZODone (DESYREL) tablet 100 mg  100 mg Oral QHS PRN Elgergawy, Silver Huguenin, MD       Current Outpatient Medications  Medication Sig Dispense Refill   acetaminophen (TYLENOL) 650 MG CR tablet  Take 1,300 mg by mouth daily as needed for pain.     albuterol (VENTOLIN HFA) 108 (90 Base) MCG/ACT inhaler Inhale 2 puffs into the lungs every 6 (six) hours as needed for wheezing. 1 each 6   aspirin EC 81 MG tablet Take 81 mg by mouth daily.     buPROPion (WELLBUTRIN XL) 150 MG 24 hr tablet Take 1 tablet (150 mg total) by mouth daily. 90 tablet 1   clopidogrel (PLAVIX) 75 MG tablet Take 1 tablet (75 mg total) by mouth daily. 90 tablet 1   diclofenac Sodium (VOLTAREN) 1 % GEL Apply 2-4 g topically 4 (four) times daily.     ezetimibe (ZETIA) 10 MG tablet Take 1 tablet (10 mg total) by mouth daily. 90 tablet 1   FLUoxetine (PROZAC) 20 MG capsule Take 3 capsules (60 mg total) by mouth daily. 270 capsule 1   gabapentin (NEURONTIN) 100 MG capsule Take 100 mg by mouth 2 (two) times daily.     glipiZIDE (GLUCOTROL XL) 2.5 MG 24 hr tablet 1 q am 90 tablet 1   hydrochlorothiazide (MICROZIDE) 12.5 MG capsule TAKE 1 CAPSULE BY MOUTH EVERY MORNING AS NEEDED FOR LEG EDEMA (Patient taking differently: Take 12.5 mg by mouth See admin instructions. TAKE 1 CAPSULE BY MOUTH EVERY MORNING AS NEEDED FOR LEG EDEMA) 90 capsule 1   JARDIANCE 10 MG TABS tablet TAKE 1 TABLET BY MOUTH DAILY BEFORE BREAKFAST. 90 tablet 1   lisinopril (ZESTRIL) 5 MG tablet TAKE 1 TABLET BY MOUTH EVERY DAY 90 tablet 1   metFORMIN (GLUCOPHAGE) 500 MG tablet 2 qam and 2qevening 360 tablet 1   naloxone (NARCAN) nasal spray 4 mg/0.1 mL Use as directed 1 each 0   OVER THE COUNTER MEDICATION Take 1 tablet by mouth daily.     Oxycodone HCl 20 MG TABS Take 1 tablet by mouth See admin instructions. Take 1 tablet by mouth 4-5 times daily if tolerated     rosuvastatin (CRESTOR) 5 MG tablet TAKE 1 TABLET  ON MONDAY AND FRIDAY 24 tablet 3   tamsulosin (FLOMAX) 0.4 MG CAPS capsule TAKE 1 CAPSULE BY MOUTH EVERY DAY 90 capsule 1   traZODone (DESYREL) 50 MG tablet 1 qhs prn insomnia 90 tablet 1   blood glucose meter kit and supplies Dispense based on patient  and insurance preference. Use to check glucose once daily as directed. (FOR ICD-10 E11.9). 1 each 0   Lancets (ONETOUCH ULTRASOFT) lancets      ONETOUCH VERIO test strip USE TO CHECK BLOOD SUGAR LEVELS ONCE DAILY 50 strip 0    Allergies as of 07/07/2022 - Review Complete 07/07/2022  Allergen Reaction Noted   Doxycycline Nausea Only 10/23/2012   Pravastatin Other (See Comments) 10/23/2012    Past Medical History:  Diagnosis Date   Arthritis    Back pain    Bursitis of hip    Bilateral   Depression    DJD (degenerative joint disease) of knee    Bilateral Left > Right   Ex-smoker    Quit 11/07/2011   Gastric ulcer    History of colon polyps    Hyperlipidemia    Primary localized osteoarthritis of right knee    PVD (peripheral vascular disease) with claudication ([place]) 05/21/2018   Under the care of vascular surgery    Past Surgical History:  Procedure Laterality Date   ABDOMINAL AORTOGRAM W/LOWER EXTREMITY N/A 05/19/2018   Procedure: ABDOMINAL AORTOGRAM W/LOWER EXTREMITY;  Surgeon: Elam Dutch, MD;  Location: [place] [facility];  Service: Cardiovascular;  Laterality: N/A;   CARPAL TUNNEL RELEASE     right   CARPAL TUNNEL RELEASE Left 05/16/13   Dr Noemi Chapel   COLONOSCOPY  01/31/2012   Procedure: COLONOSCOPY;  Surgeon: Daneil Dolin, MD;  [facility];  Service: Endoscopy;  Laterality: N/A;  7:30 AM   FINGER AMPUTATION     partial left 5th finger   JOINT REPLACEMENT     KNEE ARTHROSCOPY     right knee   ORIF FINGER FRACTURE     Right 5th finger, Amputation reatachment   PERIPHERAL VASCULAR INTERVENTION Right 05/19/2018   Procedure: PERIPHERAL VASCULAR INTERVENTION;  Surgeon: Elam Dutch, MD;  Location: [place] [facility];  Service: Cardiovascular;  Laterality: Right;  right popliteal   TONSILLECTOMY     TOTAL KNEE ARTHROPLASTY  09/04/2012   Procedure: TOTAL KNEE ARTHROPLASTY;  Surgeon: Lorn Junes, MD;  Location: [place];  Service: Orthopedics;   Laterality: Left;   TOTAL KNEE ARTHROPLASTY Right 07/21/2015   Procedure: RIGHT TOTAL KNEE ARTHROPLASTY;  Surgeon: Elsie Saas, MD;  Location: [place];  Service: Orthopedics;  Laterality: Right;    Family History  Problem Relation Age of Onset   Heart disease Mother    Heart disease Father    Diabetes type I Father    Hypertension Father    Cancer Father    Heart disease Sister    Heart disease Brother     Social History   Socioeconomic History   Marital status: Married    Spouse name: Not on file   Number of children: Not on file   Years of education: Not on file   Highest education level: Not on file  Occupational History   Occupation: disabled    Employer: OTHER  Tobacco Use   Smoking status: Former    Packs/day: 2.00    Years: 40.00    Total pack years: 80.00    Types: Cigarettes    Quit date: 10/31/2011  Years since quitting: 10.6   Smokeless tobacco: Never  Vaping Use   Vaping Use: Never used  Substance and Sexual Activity   Alcohol use: Yes    Comment: 1 beer every 3-4 months   Drug use: No   Sexual activity: Yes  Other Topics Concern   Not on file  Social History Narrative   Not on file   Social Determinants of Health   Financial Resource Strain: Low Risk  (05/31/2022)   Overall Financial Resource Strain (CARDIA)    Difficulty of Paying Living Expenses: Not hard at all  Food Insecurity: No Food Insecurity (05/31/2022)   Hunger Vital Sign    Worried About Running Out of Food in the Last Year: Never true    Ran Out of Food in the Last Year: Never true  Transportation Needs: No Transportation Needs (05/31/2022)   PRAPARE - Hydrologist (Medical): No    Lack of Transportation (Non-Medical): No  Physical Activity: Insufficiently Active (05/31/2022)   Exercise Vital Sign    Days of Exercise per Week: 2 days    Minutes of Exercise per Session: 20 min  Stress: No Stress Concern Present (05/31/2022)   [place]    Feeling of Stress : Not at all  Social Connections: Moderately Isolated (05/31/2022)   Social Connection and Isolation Panel [NHANES]    Frequency of Communication with Friends and Family: More than three times a week    Frequency of Social Gatherings with Friends and Family: Once a week    Attends Religious Services: Never    Marine scientist or Organizations: No    Attends Archivist Meetings: Never    Marital Status: Married  Human resources officer Violence: Not At Risk (05/31/2022)   Humiliation, Afraid, Rape, and Kick questionnaire    Fear of Current or Ex-Partner: No    Emotionally Abused: No    Physically Abused: No    Sexually Abused: No     Review of System:   General: Negative for anorexia, weight loss, fever, chills, fatigue, weakness. Eyes: Negative for vision changes.  ENT: Negative for hoarseness, nasal congestion. +dysphagia CV: Negative for chest pain, angina, palpitations, dyspnea on exertion, peripheral edema.  Respiratory: Negative for dyspnea at rest, dyspnea on exertion, cough, sputum, wheezing.  GI: See history of present illness. GU:  Negative for dysuria, hematuria, urinary incontinence, urinary frequency, nocturnal urination.  NF:AOZHYQM joint pain, low back pain.  Derm: Negative for rash or itching.  Neuro: Negative for weakness, abnormal sensation, seizure, frequent headaches, memory loss, confusion.  Psych: Negative for anxiety, depression, suicidal ideation, hallucinations.  Endo: Negative for unusual weight change.  Heme: Negative for bruising or bleeding. Allergy: Negative for rash or hives.      Physical Examination:   Vital signs in last 24 hours: Temp:  [97.5 F (36.4 C)-98.7 F (37.1 C)] 98.7 F (37.1 C) (12/07 0509) Pulse Rate:  [60-69] 69 (12/07 0509) Resp:  [16-22] 22 (12/07 0509) BP: (115-174)/(63-96) 115/63 (12/07 0509) SpO2:  [93 %-97 %] 95 % (12/07 0509) Weight:   [96.2 kg] 96.2 kg (12/06 0857) Last BM Date : 07/08/22  General: Well-nourished, well-developed in no acute distress.  Head: Normocephalic, atraumatic.   Eyes: Conjunctiva pink, no icterus. Mouth: Oropharyngeal mucosa moist and pink , no lesions erythema or exudate. Neck: Supple without thyromegaly, masses, or lymphadenopathy.  Lungs: Clear to auscultation bilaterally.  Heart: Regular rate and rhythm, no  murmurs rubs or gallops.  Abdomen: Bowel sounds are normal, nontender, nondistended, no hepatosplenomegaly or masses, no abdominal bruits or hernia , no rebound or guarding.   Rectal:not performed Extremities: No lower extremity edema, clubbing, deformity.  Neuro: Alert and oriented x 4 , grossly normal neurologically.  Skin: Warm and dry, no rash or jaundice.   Psych: Alert and cooperative, normal mood and affect.        Intake/Output from previous day: 12/06 0701 - 12/07 0700 In: 973.7 [I.V.:973.7] Out: -  Intake/Output this shift: No intake/output data recorded.  Lab Results:   CBC Recent Labs    07/07/22 0926 07/08/22 0503  WBC 6.0 5.1  HGB 17.6* 15.5  HCT 53.3* 46.9  MCV 95.7 96.3  PLT 105* 82*   BMET Recent Labs    07/07/22 0926 07/08/22 0503  NA 137 133*  K 4.4 3.9  CL 101 102  CO2 26 26  GLUCOSE 150* 135*  BUN 17 15  CREATININE 1.00 0.89  CALCIUM 9.1 8.3*   LFT Recent Labs    07/07/22 0926 07/08/22 0503  BILITOT 1.1 0.8  ALKPHOS 81 67  AST 27 22  ALT 48* 39  PROT 7.0 6.4*  ALBUMIN 4.1 3.7    Lipase Recent Labs    07/07/22 0926  LIPASE 35    PT/INR No results for input(s): "LABPROT", "INR" in the last 72 hours.   Hepatitis Panel No results for input(s): "HEPBSAG", "HCVAB", "HEPAIGM", "HEPBIGM" in the last 72 hours.   Imaging Studies:   DG Abd 1 View  Result Date: 07/08/2022 CLINICAL DATA:  66 year old male with abdominal pain. EXAM: ABDOMEN - 1 VIEW COMPARISON:  CT Abdomen and Pelvis yesterday. FINDINGS: Portable AP supine  views at 0521 hours. Bowel-gas pattern remains non obstructed. Lung bases appear stable, negative. No pneumoperitoneum evident on these supine views. Stable visualized osseous structures. IMPRESSION: Non obstructed bowel gas pattern. Electronically Signed   By: Genevie Ann M.D.   On: 07/08/2022 05:45   CT ABDOMEN PELVIS W CONTRAST  Result Date: 07/07/2022 CLINICAL DATA:  Abdominal pain, nonlocalized. Epigastric pain. Nauseous. EXAM: CT ABDOMEN AND PELVIS WITH CONTRAST TECHNIQUE: Multidetector CT imaging of the abdomen and pelvis was performed using the standard protocol following bolus administration of intravenous contrast. RADIATION DOSE REDUCTION: This exam was performed according to the departmental dose-optimization program which includes automated exposure control, adjustment of the mA and/or kV according to patient size and/or use of iterative reconstruction technique. CONTRAST:  138m OMNIPAQUE IOHEXOL 300 MG/ML  SOLN COMPARISON:  None Available. FINDINGS: Lower chest: Dependent atelectasis bilaterally. Hepatobiliary: Subtle nodularity of liver contour (see right hepatic lobe on 36/2) raises the question of underlying cirrhosis. No suspicious focal abnormality within the liver parenchyma. There is no evidence for gallstones, gallbladder wall thickening, or pericholecystic fluid. No intrahepatic or extrahepatic biliary dilation. Pancreas: No focal mass lesion. No dilatation of the main duct. No intraparenchymal cyst. No peripancreatic edema. Spleen: Spleen measures 17 cm craniocaudal length, enlarged. Adrenals/Urinary Tract: No adrenal nodule or mass. Kidneys unremarkable. No evidence for hydroureter. The urinary bladder appears normal for the degree of distention. Stomach/Bowel: Stomach is unremarkable. No gastric wall thickening. No evidence of outlet obstruction. Duodenum is normally positioned as is the ligament of Treitz. No small bowel wall thickening. No small bowel dilatation. The terminal ileum is  normal. Appendix is mildly dilated up to 10 mm diameter without periappendiceal edema or inflammation to suggest acute appendicitis. No gross colonic mass. No colonic wall thickening. Diverticular changes  are noted in the left colon without evidence of diverticulitis. Vascular/Lymphatic: There is moderate atherosclerotic calcification of the abdominal aorta without aneurysm. Mild lymphadenopathy noted in the hepatoduodenal ligament including 16 mm short axis lymph node on 28/2. No retroperitoneal lymphadenopathy. No pelvic sidewall lymphadenopathy. Reproductive: The prostate gland and seminal vesicles are unremarkable. Other: No intraperitoneal free fluid. Musculoskeletal: No worrisome lytic or sclerotic osseous abnormality. IMPRESSION: 1. No acute findings in the abdomen or pelvis. 2. Subtle nodularity of liver contour raises the question of underlying cirrhosis. 3. Splenomegaly. 4. Mild lymphadenopathy in the hepatoduodenal ligament, likely reactive. Consider follow-up CT abdomen in 3 months to ensure stability. 5. Left colonic diverticulosis without diverticulitis. 6.  Aortic Atherosclerosis (ICD10-I70.0). Electronically Signed   By: Misty Stanley M.D.   On: 07/07/2022 11:20   DG Abd Acute W/Chest  Result Date: 07/07/2022 CLINICAL DATA:  Epigastric pain for 2 days. EXAM: DG ABDOMEN ACUTE WITH 1 VIEW CHEST COMPARISON:  None Available. FINDINGS: Heart size is normal. No pleural effusion or edema. No airspace opacities identified. There is gas and stool noted throughout the colon up to the level of the rectum. There are few scattered prominent loops of small bowel which measure up to 3.3 cm. No air-fluid levels identified. No abnormal abdominal or pelvic calcifications. IMPRESSION: 1. No acute cardiopulmonary abnormalities. 2. No signs of high-grade bowel obstruction. 3. Few scattered prominent loops of small bowel are noted which may reflect mild ileus versus enteritis. Low-grade partial obstruction not  excluded. Electronically Signed   By: Kerby Moors M.D.   On: 07/07/2022 09:36  [4 week]  Assessment:   66 y/o male with DM, PVD with stent, chronic pain, HTN, h/o remote esophageal ulcer who presents for two day history of lower substernal chest pain/epigastric pain.   Lower substernal chest pain/epigastric pain: acute onset, associated with odynophagia, recent solid food dysphagia (for 2 weeks). Would be concern for esophagitis/ulceration due to lodged pill/food.   Abnormal liver: subtle nodularity of liver contour noted on CT. Splenomegaly. Concerning for cirrhosis. Remote history of heavy etoh use. Chronic DM, obesity. Chronically elevated ALT. Work up in 2019 with negative Hep B and C markers, normal ferritin, normal ceruloplasmin.  Abnormal CT: mild lymphadenopathy in the hepatoduodenal ligament, likely reactive but 3 month follow up CT recommended. Dilated appendix to 53m without acute appendicitis, appears asymptomatic.    H/O adenomatous colon polyps: recommend colonoscopy as outpatient.   Plan:   IV PPI every 12 hours.  EGD with possible dilation if appropriate based on findings.  I have discussed the risks, alternatives, benefits with regards to but not limited to the risk of reaction to medication, bleeding, infection, perforation and the patient is agreeable to proceed. Written consent to be obtained. Outpatient colonoscopy. CT 3 months due to lymphadenopathy surveillance. Evaluate for portal hypertension at time of EGD.  Outpatient cirrhosis care.     LOS: 0 days   We would like to thank you for the opportunity to participate in the care of PMARTEZE VECCHIO  LLaureen Ochs LBernarda CaffeyRTexas Health [facility] [id number]/7/[id number]:38 AM

## 2022-07-08 NOTE — Transfer of Care (Signed)
Immediate Anesthesia Transfer of Care Note  Patient: Jim [name]  Procedure(s) Performed: ESOPHAGOGASTRODUODENOSCOPY (EGD) WITH PROPOFOL ESOPHAGEAL DILATION BIOPSY  Patient Location: PACU  Anesthesia Type:General  Level of Consciousness: awake, alert , oriented, and patient cooperative  Airway & Oxygen Therapy: Patient Spontanous Breathing and Patient connected to nasal cannula oxygen  Post-op Assessment: Report given to RN, Post -op Vital signs reviewed and stable, and Patient moving all extremities  Post vital signs: Reviewed and stable  Last Vitals:  Vitals Value Taken Time  BP    Temp    Pulse    Resp    SpO2      Last Pain:  Vitals:   07/08/22 1414  TempSrc:   PainSc: 4       Patients Stated Pain Goal: 8 (16/10/96 0454)  Complications: No notable events documented.

## 2022-07-08 NOTE — TOC Progression Note (Signed)
  Transition of Care [facility]) Screening Note   Patient Details  Name: Jim [name] Date of Birth: 1956/02/25   Transition of Care [facility]) CM/SW Contact:    Shade Flood, LCSW Phone Number: 07/08/2022, 10:38 AM    Transition of Care Department [facility]) has reviewed patient and no TOC needs have been identified at this time. We will continue to monitor patient advancement through interdisciplinary progression rounds. If new patient transition needs arise, please place a TOC consult.

## 2022-07-08 NOTE — Anesthesia Preprocedure Evaluation (Addendum)
Anesthesia Evaluation  Patient identified by MRN, date of birth, ID band Patient awake    Reviewed: Allergy & Precautions, NPO status , Patient's Chart, lab work & pertinent test results  Airway Mallampati: III  TM Distance: >3 FB Neck ROM: Full    Dental  (+) Missing, Dental Advisory Given   Pulmonary Patient abstained from smoking., former smoker    + wheezing      Cardiovascular hypertension, Pt. on medications + Peripheral Vascular Disease  Normal cardiovascular exam Rhythm:Irregular Rate:Normal  07-Jul-2022 10:01:09 [facility] ROUTINE RECORD [birth year] (51 yr) Male Caucasian Vent. rate 78 BPM PR interval 167 ms QRS duration 92 ms QT/QTcB 411/469 ms P-R-T axes 47 60 18 Sinus rhythm Confirmed by Godfrey Pick ([id number]) on 07/07/2022 4:24:32 PM   Neuro/Psych  PSYCHIATRIC DISORDERS  Depression       GI/Hepatic PUD,,,  Endo/Other  diabetes, Well Controlled, Type 2, Oral Hypoglycemic Agents    Renal/GU      Musculoskeletal  (+) Arthritis , Osteoarthritis,    Abdominal   Peds  Hematology   Anesthesia Other Findings On chronic pain medication  oxycodone 20 mg 5 times/day  Reproductive/Obstetrics                             Anesthesia Physical Anesthesia Plan  ASA: 3  Anesthesia Plan: General   Post-op Pain Management: Minimal or no pain anticipated   Induction: Intravenous  PONV Risk Score and Plan: Propofol infusion  Airway Management Planned: Nasal Cannula and Natural Airway  Additional Equipment:   Intra-op Plan:   Post-operative Plan:   Informed Consent: I have reviewed the patients History and Physical, chart, labs and discussed the procedure including the risks, benefits and alternatives for the proposed anesthesia with the patient or authorized representative who has indicated his/her understanding and acceptance.     Dental advisory given  Plan  Discussed with: CRNA and Surgeon  Anesthesia Plan Comments: (Lungs clear after duoneb treatment)       Anesthesia Quick Evaluation

## 2022-07-08 NOTE — Hospital Course (Signed)
66 y.o. male, with past medical history of arthritis, hypertension, hyperlipidemia, diabetes mellitus, type II, peripheral vascular disease, depression, chronic pain syndrome, history of PUD in the past, patient presents to ED secondary to complaints of abdominal pain, patient reports pain developed over last 48 hours, sudden onset, denies any relieving or provoking factors, denies any postprandial pain, reports this pain resembles gastric ulcer pain he had 25 years ago, [place] being any more on any acid blocking medicines, he did try Tums, with no relief, denies any nausea, or vomiting, but reports unable to eat and oral intake due to pain, he denies any NSAID take, movement was yesterday, no melena, no coffee-ground emesis -In ED CT abdomen pelvis with no acute findings, lipase within normal limit, pain is intractable, unable to tolerate any oral intake, requiring IV pain medicine for pain control, so [facility] hospitalist consulted to admit.

## 2022-07-08 NOTE — Brief Op Note (Signed)
07/07/2022 - 07/08/2022  2:34 PM  PATIENT:  Jim [name]  66 y.o. male  PRE-OPERATIVE DIAGNOSIS:  acute substernal chest pain/epigastric pain, odynophagia, dysphagia  POST-OPERATIVE DIAGNOSIS:  gastritis, grade 1 and grade 2 varices  PROCEDURE:  Procedure(s): ESOPHAGOGASTRODUODENOSCOPY (EGD) WITH PROPOFOL (N/A) ESOPHAGEAL DILATION (N/A) BIOPSY  SURGEON:  Surgeon(s) and Role:    * Harvel Quale, MD - Primary  Patient underwent EGD under propofol sedation.  Tolerated the procedure adequately.  Findings: - Grade I/II esophageal varices.  - Erythematous mucosa in the antrum.  Biopsied.  - Normal examined duodenum.   RECOMMENDATIONS - Return patient to hospital ward for ongoing care.  - Full liquid diet, advance as tolerated. - Use Protonix (pantoprazole) 40 mg IV BID.  - Await pathology results.  - Consider stopping hydrochlorothiazide and starting carvedilol 3.125 mg BID.  Maylon Peppers, MD Gastroenterology and Hepatology [facility]

## 2022-07-08 NOTE — Op Note (Signed)
[facility] Patient Name: Jim [name] Procedure Date: 07/08/2022 2:02 PM MRN: [id number] Date of Birth: Nov 25, 1955 Attending MD: Maylon Peppers , , [id number] CSN: [id number] Age: 66 Admit Type: Outpatient Procedure:                Upper GI endoscopy Indications:              Epigastric abdominal pain, Unexplained chest pain Providers:                Maylon Peppers, Janeece Riggers, RN, Everardo Pacific Referring MD:              Medicines:                Monitored Anesthesia Care Complications:            No immediate complications. Estimated Blood Loss:     Estimated blood loss: none. Procedure:                Pre-Anesthesia Assessment:                           - Prior to the procedure, a History and Physical                            was performed, and patient medications, allergies                            and sensitivities were reviewed. The patient's                            tolerance of previous anesthesia was reviewed.                           - The risks and benefits of the procedure and the                            sedation options and risks were discussed with the                            patient. All questions were answered and informed                            consent was obtained.                           - After reviewing the risks and benefits, the                            patient was deemed in satisfactory condition to                            undergo the procedure.                           - ASA Grade Assessment: III - A patient with severe  systemic disease.                           After obtaining informed consent, the endoscope was                            passed under direct vision. Throughout the                            procedure, the patient's blood pressure, pulse, and                            oxygen saturations were monitored continuously. The                            [id number]-[id number] ([id number]) scope was  introduced through the                            mouth, and advanced to the second part of duodenum.                            The upper GI endoscopy was accomplished without                            difficulty. The patient tolerated the procedure                            well. Scope In: 2:14:29 PM Scope Out: 2:20:29 PM Total Procedure Duration: 0 hours 6 minutes 0 seconds  Findings:      Grade I to II varices were found in the middle and lower third of the       esophagus. No inflammation or ulceration were seen.      Localized mildly erythematous mucosa was found in the gastric antrum.       Biopsies were taken with a cold forceps for Helicobacter pylori testing.      The examined duodenum was normal. Impression:               - Grade I/II esophageal varices.                           - Erythematous mucosa in the antrum. Biopsied.                           - Normal examined duodenum. Moderate Sedation:      Per Anesthesia Care Recommendation:           - Return patient to hospital ward for ongoing care.                           - Full liquid diet, advance as tolerated.                           - Use Protonix (pantoprazole) 40 mg IV BID.                           -  Await pathology results.                           - Consider stopping hydrochlorothiazide and                            starting carvedilol 3.125 mg BID. Procedure Code(s):        --- Professional ---                           [phone], Esophagogastroduodenoscopy, flexible,                            transoral; with biopsy, single or multiple Diagnosis Code(s):        --- Professional ---                           I85.00, Esophageal varices without bleeding                           K31.89, Other diseases of stomach and duodenum                           R10.13, Epigastric pain                           R07.9, Chest pain, unspecified CPT copyright 2022 [employer]. All rights reserved. The codes  documented in this report are preliminary and upon coder review may  be revised to meet current compliance requirements. Maylon Peppers, MD Maylon Peppers,  07/08/2022 2:35:54 PM This report has been signed electronically. Number of Addenda: 0

## 2022-07-09 ENCOUNTER — Telehealth: Payer: Self-pay | Admitting: Gastroenterology

## 2022-07-09 DIAGNOSIS — R1013 Epigastric pain: Secondary | ICD-10-CM | POA: Diagnosis not present

## 2022-07-09 DIAGNOSIS — E1140 Type 2 diabetes mellitus with diabetic neuropathy, unspecified: Secondary | ICD-10-CM | POA: Diagnosis not present

## 2022-07-09 DIAGNOSIS — K7460 Unspecified cirrhosis of liver: Secondary | ICD-10-CM | POA: Diagnosis not present

## 2022-07-09 LAB — CBC
HCT: 42.4 % (ref 39.0–52.0)
Hemoglobin: 14 g/dL (ref 13.0–17.0)
MCH: 31.9 pg (ref 26.0–34.0)
MCHC: 33 g/dL (ref 30.0–36.0)
MCV: 96.6 fL (ref 80.0–100.0)
Platelets: 73 10*3/uL — ABNORMAL LOW (ref 150–400)
RBC: 4.39 MIL/uL (ref 4.22–5.81)
RDW: 14.4 % (ref 11.5–15.5)
WBC: 4.4 10*3/uL (ref 4.0–10.5)
nRBC: 0 % (ref 0.0–0.2)

## 2022-07-09 LAB — BASIC METABOLIC PANEL
Anion gap: 7 (ref 5–15)
BUN: 12 mg/dL (ref 8–23)
CO2: 24 mmol/L (ref 22–32)
Calcium: 8.4 mg/dL — ABNORMAL LOW (ref 8.9–10.3)
Chloride: 105 mmol/L (ref 98–111)
Creatinine, Ser: 0.77 mg/dL (ref 0.61–1.24)
GFR, Estimated: >60 mL/min (ref >60)
Glucose, Bld: 127 mg/dL — ABNORMAL HIGH (ref 70–99)
Potassium: 3.9 mmol/L (ref 3.5–5.1)
Sodium: 136 mmol/L (ref 135–145)

## 2022-07-09 LAB — GLUCOSE, CAPILLARY
Glucose-Capillary: 99 mg/dL (ref 70–99)
Glucose-Capillary: 111 mg/dL — ABNORMAL HIGH (ref 70–99)
Glucose-Capillary: 157 mg/dL — ABNORMAL HIGH (ref 70–99)

## 2022-07-09 MED ORDER — INSULIN ASPART 100 UNIT/ML IJ SOLN
0.0000 [IU] | Freq: Three times a day (TID) | INTRAMUSCULAR | Status: DC
  Administered 2022-07-09: 2 [IU] via SUBCUTANEOUS

## 2022-07-09 MED ORDER — PANTOPRAZOLE SODIUM 40 MG PO TBEC: 40.0000 mg | DELAYED_RELEASE_TABLET | Freq: Two times a day (BID) | ORAL | 1 refills | Status: DC

## 2022-07-09 NOTE — Plan of Care (Signed)

## 2022-07-09 NOTE — Discharge Summary (Signed)
Physician Discharge Summary  Jim [name] [id number] DOB: 05-Oct-1955 DOA: 07/07/2022  GI: [facility]   Admit date: 07/07/2022 Discharge date: 07/09/2022  Admitted From:  Home  Disposition: Home   Recommendations for Outpatient Follow-up:  Follow up with [facility] in 3-4 weeks Follow up with PCP in 1 week  Please consider starting carvedilol 3.125 mg BID for BP and esophageal varices  Discharge Condition: STABLE   CODE STATUS: FULL DIET: soft foods x 1 week then heart health 2 gm sodium restricted    Brief Hospitalization Summary: Please see all hospital notes, images, labs for full details of the hospitalization. ADMISSION HPI:  66 y.o. male, with past medical history of arthritis, hypertension, hyperlipidemia, diabetes mellitus, type II, peripheral vascular disease, depression, chronic pain syndrome, history of PUD in the past, patient presents to ED secondary to complaints of abdominal pain, patient reports pain developed over last 48 hours, sudden onset, denies any relieving or provoking factors, denies any postprandial pain, reports this pain resembles gastric ulcer pain he had 25 years ago, [place] being any more on any acid blocking medicines, he did try Tums, with no relief, denies any nausea, or vomiting, but reports unable to eat and oral intake due to pain, he denies any NSAID take, movement was yesterday, no melena, no coffee-ground emesis -In ED CT abdomen pelvis with no acute findings, lipase within normal limit, pain is intractable, unable to tolerate any oral intake, requiring IV pain medicine for pain control, so [facility] hospitalist consulted to admit.  HOSPITAL COURSE  BY PROBLEM LIST  Abdominal Pain - resolved  - pain seemed out of proportion to abdominal exam - reported pain feels similar to prior gastric ulcer - EGD 12/7 with GI service with findings of gastritis, grade 1 and grade 2 varices.  - IV protonix ordered - DC on oral protonix 40 mg BID - GI said to  consider stopping HCTZ and starting carvedilol 3.125 mg BID.  We did not start in hospital due to soft BPs.  Readdress and re-evaluate in the outpatient setting.  Pt will follow up with GI in 3-4 weeks.  Pt will see PCP in 1-2 weeks for hospital follow up.     Chronic back pain/opioid dependence  - resume home pain management program with close outpatient follow up    PVD - he is on plavix and aspirin   Essential hypertension  - GI recommended stop HCTZ and start carvedilol 3.125 mg BID given new findings of esophageal varices  - he is having soft BPs in hospital and we did not start the carvedilol in hospital.  Recommend reassess on outpatient follow up with GI and PCP.      Type 2 Diabetes Mellitus  - continue SSI coverage and CBG monitoring  CBG (last 3)  Recent Labs    07/09/22 0327 07/09/22 0733 07/09/22 1104  GLUCAP 99 111* 157*     Depression  - remains on fluoxetine and bupropion    Hyperlipidemia - remains on rosuvastatin and ezetimibe     Discharge Diagnoses:  Principal Problem:   Abdominal pain Active Problems:   Depression   Gastric ulcer   Type 2 diabetes mellitus with diabetic neuropathy, unspecified (HCC)   PVD (peripheral vascular disease) with claudication (HCC)   Abdominal pain, epigastric   Odynophagia   Esophageal dysphagia   Cirrhosis of liver without ascites [facility])   Discharge Instructions:  Allergies as of 07/09/2022       Reactions   Doxycycline Nausea  Only   Pravastatin Other (See Comments)   myalgia        Medication List     STOP taking these medications    hydrochlorothiazide 12.5 MG capsule Commonly known as: MICROZIDE       TAKE these medications    acetaminophen 650 MG CR tablet Commonly known as: TYLENOL Take 1,300 mg by mouth daily as needed for pain.   albuterol 108 (90 Base) MCG/ACT inhaler Commonly known as: VENTOLIN HFA Inhale 2 puffs into the lungs every 6 (six) hours as needed for wheezing.   aspirin EC  81 MG tablet Take 81 mg by mouth daily.   blood glucose meter kit and supplies Dispense based on patient and insurance preference. Use to check glucose once daily as directed. (FOR ICD-10 E11.9).   buPROPion 150 MG 24 hr tablet Commonly known as: WELLBUTRIN XL Take 1 tablet (150 mg total) by mouth daily.   clopidogrel 75 MG tablet Commonly known as: PLAVIX Take 1 tablet (75 mg total) by mouth daily.   diclofenac Sodium 1 % Gel Commonly known as: VOLTAREN Apply 2-4 g topically 4 (four) times daily.   ezetimibe 10 MG tablet Commonly known as: ZETIA Take 1 tablet (10 mg total) by mouth daily.   FLUoxetine 20 MG capsule Commonly known as: PROZAC Take 3 capsules (60 mg total) by mouth daily.   gabapentin 100 MG capsule Commonly known as: NEURONTIN Take 100 mg by mouth 2 (two) times daily.   glipiZIDE 2.5 MG 24 hr tablet Commonly known as: GLUCOTROL XL 1 q am   Jardiance 10 MG Tabs tablet Generic drug: empagliflozin TAKE 1 TABLET BY MOUTH DAILY BEFORE BREAKFAST.   lisinopril 5 MG tablet Commonly known as: ZESTRIL TAKE 1 TABLET BY MOUTH EVERY DAY   metFORMIN 500 MG tablet Commonly known as: GLUCOPHAGE 2 qam and 2qevening   naloxone 4 MG/0.1ML Liqd nasal spray kit Commonly known as: NARCAN Use as directed   Engineering geologist test strip Generic drug: glucose blood USE TO CHECK BLOOD SUGAR LEVELS ONCE DAILY   OVER THE COUNTER MEDICATION Take 1 tablet by mouth daily.   Oxycodone HCl 20 MG Tabs Take 1 tablet by mouth See admin instructions. Take 1 tablet by mouth 4-5 times daily if tolerated   pantoprazole 40 MG tablet Commonly known as: Protonix Take 1 tablet (40 mg total) by mouth 2 (two) times daily.   rosuvastatin 5 MG tablet Commonly known as: CRESTOR TAKE 1 TABLET ON MONDAY AND FRIDAY   tamsulosin 0.4 MG Caps capsule Commonly known as: FLOMAX TAKE 1 CAPSULE BY MOUTH EVERY DAY   traZODone 50 MG tablet Commonly known as:  DESYREL 1 qhs prn insomnia        Follow-up Information     [facility]. Schedule an appointment as soon as possible for a visit in 3 week(s).   Why: Hospital Follow Up Contact information: [address] [place] [id number]        Primary Care Provider. Schedule an appointment as soon as possible for a visit in 1 week(s).   Why: Hospital Follow Up               Allergies  Allergen Reactions   Doxycycline Nausea Only   Pravastatin Other (See Comments)    myalgia   Allergies as of 07/09/2022       Reactions   Doxycycline Nausea Only   Pravastatin Other (See Comments)   myalgia  Medication List     STOP taking these medications    hydrochlorothiazide 12.5 MG capsule Commonly known as: MICROZIDE       TAKE these medications    acetaminophen 650 MG CR tablet Commonly known as: TYLENOL Take 1,300 mg by mouth daily as needed for pain.   albuterol 108 (90 Base) MCG/ACT inhaler Commonly known as: VENTOLIN HFA Inhale 2 puffs into the lungs every 6 (six) hours as needed for wheezing.   aspirin EC 81 MG tablet Take 81 mg by mouth daily.   blood glucose meter kit and supplies Dispense based on patient and insurance preference. Use to check glucose once daily as directed. (FOR ICD-10 E11.9).   buPROPion 150 MG 24 hr tablet Commonly known as: WELLBUTRIN XL Take 1 tablet (150 mg total) by mouth daily.   clopidogrel 75 MG tablet Commonly known as: PLAVIX Take 1 tablet (75 mg total) by mouth daily.   diclofenac Sodium 1 % Gel Commonly known as: VOLTAREN Apply 2-4 g topically 4 (four) times daily.   ezetimibe 10 MG tablet Commonly known as: ZETIA Take 1 tablet (10 mg total) by mouth daily.   FLUoxetine 20 MG capsule Commonly known as: PROZAC Take 3 capsules (60 mg total) by mouth daily.   gabapentin 100 MG capsule Commonly known as: NEURONTIN Take 100 mg by mouth 2 (two) times  daily.   glipiZIDE 2.5 MG 24 hr tablet Commonly known as: GLUCOTROL XL 1 q am   Jardiance 10 MG Tabs tablet Generic drug: empagliflozin TAKE 1 TABLET BY MOUTH DAILY BEFORE BREAKFAST.   lisinopril 5 MG tablet Commonly known as: ZESTRIL TAKE 1 TABLET BY MOUTH EVERY DAY   metFORMIN 500 MG tablet Commonly known as: GLUCOPHAGE 2 qam and 2qevening   naloxone 4 MG/0.1ML Liqd nasal spray kit Commonly known as: NARCAN Use as directed   onetouch ultrasoft lancets   OneTouch Verio test strip Generic drug: glucose blood USE TO CHECK BLOOD SUGAR LEVELS ONCE DAILY   OVER THE COUNTER MEDICATION Take 1 tablet by mouth daily.   Oxycodone HCl 20 MG Tabs Take 1 tablet by mouth See admin instructions. Take 1 tablet by mouth 4-5 times daily if tolerated   pantoprazole 40 MG tablet Commonly known as: Protonix Take 1 tablet (40 mg total) by mouth 2 (two) times daily.   rosuvastatin 5 MG tablet Commonly known as: CRESTOR TAKE 1 TABLET ON MONDAY AND FRIDAY   tamsulosin 0.4 MG Caps capsule Commonly known as: FLOMAX TAKE 1 CAPSULE BY MOUTH EVERY DAY   traZODone 50 MG tablet Commonly known as: DESYREL 1 qhs prn insomnia        Procedures/Studies: DG Abd 1 View  Result Date: 07/08/2022 CLINICAL DATA:  66-year-old male with abdominal pain. EXAM: ABDOMEN - 1 VIEW COMPARISON:  CT Abdomen and Pelvis yesterday. FINDINGS: Portable AP supine views at 0521 hours. Bowel-gas pattern remains non obstructed. Lung bases appear stable, negative. No pneumoperitoneum evident on these supine views. Stable visualized osseous structures. IMPRESSION: Non obstructed bowel gas pattern. Electronically Signed   By: H  Hall M.D.   On: 07/08/2022 05:45   CT ABDOMEN PELVIS W CONTRAST  Result Date: 07/07/2022 CLINICAL DATA:  Abdominal pain, nonlocalized. Epigastric pain. Nauseous. EXAM: CT ABDOMEN AND PELVIS WITH CONTRAST TECHNIQUE: Multidetector CT imaging of the abdomen and pelvis was performed using the  standard protocol following bolus administration of intravenous contrast. RADIATION DOSE REDUCTION: This exam was performed according to the departmental dose-optimization program which includes automated exposure control,   adjustment of the mA and/or kV according to patient size and/or use of iterative reconstruction technique. CONTRAST:  190m OMNIPAQUE IOHEXOL 300 MG/ML  SOLN COMPARISON:  None Available. FINDINGS: Lower chest: Dependent atelectasis bilaterally. Hepatobiliary: Subtle nodularity of liver contour (see right hepatic lobe on 36/2) raises the question of underlying cirrhosis. No suspicious focal abnormality within the liver parenchyma. There is no evidence for gallstones, gallbladder wall thickening, or pericholecystic fluid. No intrahepatic or extrahepatic biliary dilation. Pancreas: No focal mass lesion. No dilatation of the main duct. No intraparenchymal cyst. No peripancreatic edema. Spleen: Spleen measures 17 cm craniocaudal length, enlarged. Adrenals/Urinary Tract: No adrenal nodule or mass. Kidneys unremarkable. No evidence for hydroureter. The urinary bladder appears normal for the degree of distention. Stomach/Bowel: Stomach is unremarkable. No gastric wall thickening. No evidence of outlet obstruction. Duodenum is normally positioned as is the ligament of Treitz. No small bowel wall thickening. No small bowel dilatation. The terminal ileum is normal. Appendix is mildly dilated up to 10 mm diameter without periappendiceal edema or inflammation to suggest acute appendicitis. No gross colonic mass. No colonic wall thickening. Diverticular changes are noted in the left colon without evidence of diverticulitis. Vascular/Lymphatic: There is moderate atherosclerotic calcification of the abdominal aorta without aneurysm. Mild lymphadenopathy noted in the hepatoduodenal ligament including 16 mm short axis lymph node on 28/2. No retroperitoneal lymphadenopathy. No pelvic sidewall lymphadenopathy.  Reproductive: The prostate gland and seminal vesicles are unremarkable. Other: No intraperitoneal free fluid. Musculoskeletal: No worrisome lytic or sclerotic osseous abnormality. IMPRESSION: 1. No acute findings in the abdomen or pelvis. 2. Subtle nodularity of liver contour raises the question of underlying cirrhosis. 3. Splenomegaly. 4. Mild lymphadenopathy in the hepatoduodenal ligament, likely reactive. Consider follow-up CT abdomen in 3 months to ensure stability. 5. Left colonic diverticulosis without diverticulitis. 6.  Aortic Atherosclerosis (ICD10-I70.0). Electronically Signed   By: EMisty StanleyM.D.   On: 07/07/2022 11:20   DG Abd Acute W/Chest  Result Date: 07/07/2022 CLINICAL DATA:  Epigastric pain for 2 days. EXAM: DG ABDOMEN ACUTE WITH 1 VIEW CHEST COMPARISON:  None Available. FINDINGS: Heart size is normal. No pleural effusion or edema. No airspace opacities identified. There is gas and stool noted throughout the colon up to the level of the rectum. There are few scattered prominent loops of small bowel which measure up to 3.3 cm. No air-fluid levels identified. No abnormal abdominal or pelvic calcifications. IMPRESSION: 1. No acute cardiopulmonary abnormalities. 2. No signs of high-grade bowel obstruction. 3. Few scattered prominent loops of small bowel are noted which may reflect mild ileus versus enteritis. Low-grade partial obstruction not excluded. Electronically Signed   By: TKerby MoorsM.D.   On: 07/07/2022 09:36     Subjective: Pt reported that he is tolerating his soft diet and pain is much better now.  He is wanting to go home.  He will follow up with GI service outpatient as recommended.    Discharge Exam: Vitals:   07/09/22 0336 07/09/22 1212  BP: 112/71 94/69  Pulse: 62 71  Resp: 18 20  Temp: 98.2 F (36.8 C) 98.1 F (36.7 C)  SpO2: 96% 91%   Vitals:   07/08/22 1500 07/08/22 2047 07/09/22 0336 07/09/22 1212  BP: 110/66 124/67 112/71 94/69  Pulse: 75 72 62 71   Resp:  _0 Temp: 97.6 F (36.4 C) 98.2 F (36.8 C) 98.2 F (36.8 C) 98.1 F (36.7 C)  TempSrc: Oral   Oral  SpO2: 96% 96% 96% 91%  Weight:      Height:        General: Pt is alert, awake, not in acute distress Cardiovascular: RRR, S1/S2 +, no rubs, no gallops Respiratory: CTA bilaterally, no wheezing, no rhonchi Abdominal: Soft, NT, ND, bowel sounds + Extremities: no edema, no cyanosis   The results of significant diagnostics from this hospitalization (including imaging, microbiology, ancillary and laboratory) are listed below for reference.     Microbiology: No results found for this or any previous visit (from the past 240 hour(s)).   Labs: BNP (last 3 results) No results for input(s): "BNP" in the last 8760 hours. Basic Metabolic Panel: Recent Labs  Lab 07/07/22 0926 07/08/22 0503 07/09/22 0503  NA 137 133* 136  K 4.4 3.9 3.9  CL 101 102 105  CO2 26 26 24  GLUCOSE 150* 135* 127*  BUN 17 15 12  CREATININE 1.00 0.89 0.77  CALCIUM 9.1 8.3* 8.4*  MG 2.3  --   --    Liver Function Tests: Recent Labs  Lab 07/07/22 0926 07/08/22 0503  AST 27 22  ALT 48* 39  ALKPHOS 81 67  BILITOT 1.1 0.8  PROT 7.0 6.4*  ALBUMIN 4.1 3.7   Recent Labs  Lab 07/07/22 0926  LIPASE 35   No results for input(s): "AMMONIA" in the last 168 hours. CBC: Recent Labs  Lab 07/07/22 0926 07/08/22 0503 07/09/22 0503  WBC 6.0 5.1 4.4  NEUTROABS 4.3  --   --   HGB 17.6* 15.5 14.0  HCT 53.3* 46.9 42.4  MCV 95.7 96.3 96.6  PLT 105* 82* 73*   Cardiac Enzymes: No results for input(s): "CKTOTAL", "CKMB", "CKMBINDEX", "TROPONINI" in the last 168 hours. BNP: Invalid input(s): "POCBNP" CBG: Recent Labs  Lab 07/08/22 2046 07/08/22 2340 07/09/22 0327 07/09/22 0733 07/09/22 1104  GLUCAP 146* 137* 99 111* 157*   D-Dimer No results for input(s): "DDIMER" in the last 72 hours. Hgb A1c No results for input(s): "HGBA1C" in the last 72 hours. Lipid Profile No results for  input(s): "CHOL", "HDL", "LDLCALC", "TRIG", "CHOLHDL", "LDLDIRECT" in the last 72 hours. Thyroid function studies No results for input(s): "TSH", "T4TOTAL", "T3FREE", "THYROIDAB" in the last 72 hours.  Invalid input(s): "FREET3" Anemia work up No results for input(s): "VITAMINB12", "FOLATE", "FERRITIN", "TIBC", "IRON", "RETICCTPCT" in the last 72 hours. Urinalysis    Component Value Date/Time   COLORURINE YELLOW 07/07/2022 1230   APPEARANCEUR CLEAR 07/07/2022 1230   LABSPEC >1.046 (H) 07/07/2022 1230   PHURINE 8.0 07/07/2022 1230   GLUCOSEU >=500 (A) 07/07/2022 1230   HGBUR NEGATIVE 07/07/2022 1230   BILIRUBINUR NEGATIVE 07/07/2022 1230   KETONESUR NEGATIVE 07/07/2022 1230   PROTEINUR NEGATIVE 07/07/2022 1230   UROBILINOGEN 0.2 08/29/2012 1221   NITRITE NEGATIVE 07/07/2022 1230   LEUKOCYTESUR NEGATIVE 07/07/2022 1230   Sepsis Labs Recent Labs  Lab 07/07/22 0926 07/08/22 0503 07/09/22 0503  WBC 6.0 5.1 4.4   Microbiology No results found for this or any previous visit (from the past 240 hour(s)).  Time coordinating discharge: 42 mins   SIGNED:  Clanford Johnson, MD  [facility] 07/09/2022, 12:13 PM How to contact the [facility] Attending or Consulting provider 7A - 7P or covering provider during after hours 7P -7A, for this patient?  Check the care team in [facility] and look for a) attending/consulting [facility] provider listed and b) the [facility] team listed Log into [email or web address] and use [place]'s universal password to access. If you do not have the password, please contact the hospital   operator. Locate the [facility] provider you are looking for under [facility] and page to a number that you can be directly reached. If you still have difficulty reaching the provider, please page the DOC (Director on Call) for the Hospitalists listed on amion for assistance.  

## 2022-07-09 NOTE — Care Management Important Message (Signed)
Important Message  Patient Details  Name: Jim [name] MRN: [id number] Date of Birth: [birth year]   Medicare Important Message Given:  N/A - LOS <3 / Initial given by admissions     Tommy Medal 07/09/2022, 12:16 PM

## 2022-07-09 NOTE — Telephone Encounter (Signed)
Please make patient a hospital follow-up in 3-4 weeks.  Needs further workup of cirrhosis.  Can be made with Magda Paganini or any other APP.  Venetia Night, MSN, APRN, FNP-BC, AGACNP-BC [facility] at [facility]

## 2022-07-09 NOTE — Discharge Instructions (Signed)
IMPORTANT INFORMATION: PAY CLOSE ATTENTION   PHYSICIAN DISCHARGE INSTRUCTIONS  Follow with Primary care provider  [facility], DO  and other consultants as instructed by your Hospitalist Physician  [place] IF SYMPTOMS COME BACK, WORSEN OR NEW PROBLEM DEVELOPS   Please note: You were cared for by a hospitalist during your hospital stay. Every effort will be made to forward records to your primary care provider.  You can request that your primary care provider send for your hospital records if they have not received them.  Once you are discharged, your primary care physician will handle any further medical issues. Please note that NO REFILLS for any discharge medications will be authorized once you are discharged, as it is imperative that you return to your primary care physician (or establish a relationship with a primary care physician if you do not have one) for your post hospital discharge needs so that they can reassess your need for medications and monitor your lab values.  Please get a complete blood count and chemistry panel checked by your Primary MD at your next visit, and again as instructed by your Primary MD.  Get Medicines reviewed and adjusted: Please take all your medications with you for your next visit with your Primary MD  Laboratory/radiological data: Please request your Primary MD to go over all hospital tests and procedure/radiological results at the follow up, please ask your primary care provider to get all Hospital records sent to his/her office.  In some cases, they will be blood work, cultures and biopsy results pending at the time of your discharge. Please request that your primary care provider follow up on these results.  If you are diabetic, please bring your blood sugar readings with you to your follow up appointment with primary care.    Please call and make your follow up appointments as soon as possible.    Also  Note the following: If you experience worsening of your admission symptoms, develop shortness of breath, life threatening emergency, suicidal or homicidal thoughts you must seek medical attention immediately by calling 911 or calling your MD immediately  if symptoms less severe.  You must read complete instructions/literature along with all the possible adverse reactions/side effects for all the Medicines you take and that have been prescribed to you. Take any new Medicines after you have completely understood and accpet all the possible adverse reactions/side effects.   Do not drive when taking Pain medications or sleeping medications (Benzodiazepines)  Do not take more than prescribed Pain, Sleep and Anxiety Medications. It is not advisable to combine anxiety,sleep and pain medications without talking with your primary care practitioner  Special Instructions: If you have smoked or chewed Tobacco  in the last 2 yrs please stop smoking, stop any regular Alcohol  and or any Recreational drug use.  Wear Seat belts while driving.  Do not drive if taking any narcotic, mind altering or controlled substances or recreational drugs or alcohol.

## 2022-07-12 ENCOUNTER — Telehealth: Payer: Self-pay | Admitting: *Deleted

## 2022-07-12 ENCOUNTER — Encounter: Payer: Self-pay | Admitting: *Deleted

## 2022-07-12 LAB — SURGICAL PATHOLOGY

## 2022-07-12 NOTE — Patient Outreach (Signed)
  Care Coordination TOC Note Transition Care Management Follow-up Telephone Call Date of discharge and from where: [facility] on 07/09/22 How have you been since you were released from the hospital? "I'm doing alright. The pain is much better." Any questions or concerns? No  Items Reviewed: Did the pt receive and understand the discharge instructions provided? Yes  Medications obtained and verified? Yes  Other? Yes Reviewed discharge note that GI recommended PCP to consider switching from HCTZ to carvedilol to control BP and esophageal varices. Did not change while inpatient due to soft BPs Any new allergies since your discharge? No  Dietary orders reviewed? Yes. soft foods x 1 week then heart health 2 gm sodium restricted   Do you have support at home? Yes   Home Care and Equipment/Supplies: Were home health services ordered? no If so, what is the name of the agency?   Has the agency set up a time to come to the patient's home? not applicable Were any new equipment or medical supplies ordered?  No What is the name of the medical supply agency?  Were you able to get the supplies/equipment? not applicable Do you have any questions related to the use of the equipment or supplies? No  Functional Questionnaire: (I = Independent and D = Dependent) ADLs: I  Bathing/Dressing- I  Meal Prep- I  Eating- I  Maintaining continence- I  Transferring/Ambulation- I  Managing Meds- I  Follow up appointments reviewed:  PCP Hospital f/u appt confirmed? Yes  Scheduled to see Dr Wolfgang Phoenix on 07/15/22 at 11:40 [place] [facility] f/u appt confirmed? Yes  Scheduled to see Venetia Night, NP (GI) on 08/12/22 at 3:30 Are transportation arrangements needed? No  If their condition worsens, is the pt aware to call PCP or go to the Emergency Dept.? Yes Was the patient provided with contact information for the PCP's office or ED? Yes Was to pt encouraged to call back with questions or concerns? Yes  SDOH  assessments and interventions completed:   Yes SDOH Interventions Today    Flowsheet Row Most Recent Value  SDOH Interventions   Food Insecurity Interventions Intervention Not Indicated  Housing Interventions Intervention Not Indicated  Transportation Interventions Intervention Not Indicated  Utilities Interventions Intervention Not Indicated  Financial Strain Interventions Intervention Not Indicated       Care Coordination Interventions:  No Care Coordination interventions needed at this time.   Encounter Outcome:  Pt. Visit Completed    Chong Sicilian, BSN, RN-BC RN Care Coordinator [place] Direct Dial: [phone] Main #: [phone]

## 2022-07-13 ENCOUNTER — Encounter (HOSPITAL_COMMUNITY): Payer: Self-pay | Admitting: Gastroenterology

## 2022-07-15 ENCOUNTER — Ambulatory Visit (INDEPENDENT_AMBULATORY_CARE_PROVIDER_SITE_OTHER): Payer: Medicare Other | Admitting: Family Medicine

## 2022-07-15 VITALS — BP 120/76 | HR 70 | Temp 98.0°F | Wt 209.8 lb

## 2022-07-15 DIAGNOSIS — I8510 Secondary esophageal varices without bleeding: Secondary | ICD-10-CM | POA: Diagnosis not present

## 2022-07-15 DIAGNOSIS — E1140 Type 2 diabetes mellitus with diabetic neuropathy, unspecified: Secondary | ICD-10-CM

## 2022-07-15 DIAGNOSIS — K7460 Unspecified cirrhosis of liver: Secondary | ICD-10-CM | POA: Diagnosis not present

## 2022-07-15 DIAGNOSIS — D696 Thrombocytopenia, unspecified: Secondary | ICD-10-CM

## 2022-07-15 MED ORDER — CARVEDILOL 3.125 MG PO TABS: 3.1250 mg | ORAL_TABLET | Freq: Two times a day (BID) | ORAL | 3 refills | Status: DC

## 2022-07-15 NOTE — Progress Notes (Signed)
   Subjective:    Patient ID: Jim [name], male    DOB: Mar 29, 1956, 66 y.o.   MRN: [id number]  HPI Patient arrives today for hospital follow up. Please see hospital record See EGD Sees CAT scan Also see lab work Patient denies any hematemesis hematochezia.  Denies epigastric pain.  Was drinking alcohol but stopped this once he went into the hospital   Review of Systems     Objective:   Physical Exam General-in no acute distress Eyes-no discharge Lungs-respiratory rate normal, CTA CV-no murmurs,RRR Extremities skin warm dry no edema Neuro grossly normal Behavior normal, alert  We did discuss what cirrhosis means 1 esophageal varices mean and the ways to monitor this and try to keep it under control      Assessment & Plan:  1. Thrombocytopenia (HCC) Check CBC.  May have to stop some of his medications depending if platelets fall below 50,000 - CBC with Differential/Platelet  2. Cirrhosis of liver without ascites, unspecified hepatic cirrhosis type ([place]) Follow through with gastroenterology check liver functions.  Avoid alcohol. - Hepatic function panel  3. Secondary esophageal 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) Low-dose beta-blocker twice daily.  Stop lisinopril.  Follow-up within 2 weeks to recheck blood pressure  4. Type 2 diabetes mellitus with diabetic neuropathy, without long-term current use of insulin (HCC) Check A1c monitor closely.  Continue current measures.  Dietary measures discussed. - Hemoglobin B5D - Basic metabolic panel  Warning signs what to do for esophageal bleeding or other related issues was discussed.  Chronic nature of cirrhosis was discussed as well very important to follow through gastroenterology

## 2022-07-16 LAB — CBC WITH DIFFERENTIAL/PLATELET
Basophils Absolute: 0 10*3/uL (ref 0.0–0.2)
Basos: 1 %
EOS (ABSOLUTE): 0.1 10*3/uL (ref 0.0–0.4)
Eos: 2 %
Hematocrit: 48.2 % (ref 37.5–51.0)
Hemoglobin: 15.9 g/dL (ref 13.0–17.7)
Immature Grans (Abs): 0 10*3/uL (ref 0.0–0.1)
Immature Granulocytes: 0 %
Lymphocytes Absolute: 1.7 10*3/uL (ref 0.7–3.1)
Lymphs: 25 %
MCH: 31.5 pg (ref 26.6–33.0)
MCHC: 33 g/dL (ref 31.5–35.7)
MCV: 95 fL (ref 79–97)
Monocytes Absolute: 0.5 10*3/uL (ref 0.1–0.9)
Monocytes: 8 %
Neutrophils Absolute: 4.2 10*3/uL (ref 1.4–7.0)
Neutrophils: 64 %
Platelets: 112 10*3/uL — ABNORMAL LOW (ref 150–450)
RBC: 5.05 x10E6/uL (ref 4.14–5.80)
RDW: 13.9 % (ref 11.6–15.4)
WBC: 6.5 10*3/uL (ref 3.4–10.8)

## 2022-07-16 LAB — HEMOGLOBIN A1C
Est. average glucose Bld gHb Est-mCnc: 137 mg/dL
Hgb A1c MFr Bld: 6.4 % — ABNORMAL HIGH (ref 4.8–5.6)

## 2022-07-16 LAB — HEPATIC FUNCTION PANEL
ALT: 51 IU/L — ABNORMAL HIGH (ref 0–44)
AST: 33 IU/L (ref 0–40)
Albumin: 4.3 g/dL (ref 3.9–4.9)
Alkaline Phosphatase: 129 IU/L — ABNORMAL HIGH (ref 44–121)
Bilirubin Total: 0.2 mg/dL (ref 0.0–1.2)
Bilirubin, Direct: <0.1 mg/dL (ref 0.00–0.40)
Total Protein: 6.5 g/dL (ref 6.0–8.5)

## 2022-07-16 LAB — BASIC METABOLIC PANEL
BUN/Creatinine Ratio: 11 (ref 10–24)
BUN: 14 mg/dL (ref 8–27)
CO2: 22 mmol/L (ref 20–29)
Calcium: 9 mg/dL (ref 8.6–10.2)
Chloride: 105 mmol/L (ref 96–106)
Creatinine, Ser: 1.28 mg/dL — ABNORMAL HIGH (ref 0.76–1.27)
Glucose: 108 mg/dL — ABNORMAL HIGH (ref 70–99)
Potassium: 4.7 mmol/L (ref 3.5–5.2)
Sodium: 144 mmol/L (ref 134–144)
eGFR: 61 mL/min/{1.73_m2} (ref >59)

## 2022-07-19 DIAGNOSIS — Z1211 Encounter for screening for malignant neoplasm of colon: Secondary | ICD-10-CM | POA: Diagnosis not present

## 2022-07-19 DIAGNOSIS — H524 Presbyopia: Secondary | ICD-10-CM | POA: Diagnosis not present

## 2022-07-19 DIAGNOSIS — H2512 Age-related nuclear cataract, left eye: Secondary | ICD-10-CM | POA: Diagnosis not present

## 2022-07-19 NOTE — Progress Notes (Signed)
Patient notified

## 2022-07-19 NOTE — Addendum Note (Signed)
Addended by: Dairl Ponder on: 07/19/2022 10:36 AM   Modules accepted: Orders

## 2022-07-22 DIAGNOSIS — K7460 Unspecified cirrhosis of liver: Secondary | ICD-10-CM | POA: Diagnosis not present

## 2022-07-23 LAB — ANA: Anti Nuclear Antibody (ANA): NEGATIVE

## 2022-07-23 LAB — HEPATITIS C ANTIBODY: Hep C Virus Ab: NONREACTIVE

## 2022-07-23 LAB — MITOCHONDRIAL/SMOOTH MUSCLE AB PNL
Mitochondrial Ab: <20 Units (ref 0.0–20.0)
Smooth Muscle Ab: 8 Units (ref 0–19)

## 2022-07-23 LAB — HEPATITIS B SURFACE ANTIGEN: Hepatitis B Surface Ag: NEGATIVE

## 2022-07-24 DIAGNOSIS — G5602 Carpal tunnel syndrome, left upper limb: Secondary | ICD-10-CM | POA: Diagnosis not present

## 2022-07-25 LAB — COLOGUARD: COLOGUARD: NEGATIVE

## 2022-07-29 DIAGNOSIS — G5602 Carpal tunnel syndrome, left upper limb: Secondary | ICD-10-CM | POA: Diagnosis not present

## 2022-07-29 DIAGNOSIS — G8929 Other chronic pain: Secondary | ICD-10-CM | POA: Diagnosis not present

## 2022-08-03 ENCOUNTER — Other Ambulatory Visit: Payer: Self-pay | Admitting: Family Medicine

## 2022-08-04 ENCOUNTER — Other Ambulatory Visit: Payer: Self-pay | Admitting: Family Medicine

## 2022-08-05 ENCOUNTER — Ambulatory Visit: Payer: Medicare Other | Admitting: Family Medicine

## 2022-08-05 DIAGNOSIS — M7061 Trochanteric bursitis, right hip: Secondary | ICD-10-CM | POA: Diagnosis not present

## 2022-08-06 ENCOUNTER — Ambulatory Visit (INDEPENDENT_AMBULATORY_CARE_PROVIDER_SITE_OTHER): Payer: Medicare Other | Admitting: Family Medicine

## 2022-08-06 VITALS — BP 106/56 | HR 77 | Temp 97.7°F | Ht 68.0 in | Wt 216.0 lb

## 2022-08-06 DIAGNOSIS — I739 Peripheral vascular disease, unspecified: Secondary | ICD-10-CM | POA: Diagnosis not present

## 2022-08-06 DIAGNOSIS — I8510 Secondary esophageal varices without bleeding: Secondary | ICD-10-CM | POA: Diagnosis not present

## 2022-08-06 DIAGNOSIS — E785 Hyperlipidemia, unspecified: Secondary | ICD-10-CM

## 2022-08-06 DIAGNOSIS — I10 Essential (primary) hypertension: Secondary | ICD-10-CM | POA: Diagnosis not present

## 2022-08-06 DIAGNOSIS — E1169 Type 2 diabetes mellitus with other specified complication: Secondary | ICD-10-CM | POA: Diagnosis not present

## 2022-08-06 DIAGNOSIS — E1140 Type 2 diabetes mellitus with diabetic neuropathy, unspecified: Secondary | ICD-10-CM

## 2022-08-06 DIAGNOSIS — K7460 Unspecified cirrhosis of liver: Secondary | ICD-10-CM | POA: Diagnosis not present

## 2022-08-06 DIAGNOSIS — D696 Thrombocytopenia, unspecified: Secondary | ICD-10-CM

## 2022-08-06 DIAGNOSIS — F324 Major depressive disorder, single episode, in partial remission: Secondary | ICD-10-CM

## 2022-08-06 MED ORDER — METFORMIN HCL 500 MG PO TABS: ORAL_TABLET | ORAL | 1 refills | Status: DC

## 2022-08-06 NOTE — Patient Instructions (Signed)
Reduce metformin to one tablet twice a day Stop glipizide  Recheck in spring

## 2022-08-06 NOTE — Progress Notes (Signed)
   Subjective:    Patient ID: Jim [name], male    DOB: August 02, 1956, 67 y.o.   MRN: [id number]  HPI Follow up for arthritis pain in multiple sites, knees, lower back  Bursitis in hips - receiving steroid injection in hip and shoulder at Dr Landry Corporal' office HTN (hypertension), benign  Cirrhosis of liver without ascites, unspecified hepatic cirrhosis type ([place])  Type 2 diabetes mellitus with diabetic neuropathy, without long-term current use of insulin ([place])  Hyperlipidemia associated with type 2 diabetes mellitus ([place])  Secondary esophageal 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)  Thrombocytopenia (HCC)  Depression, major, single episode, in partial remission ([place]), Chronic  PVD (peripheral vascular disease) with claudication ([place]), Chronic  Patient has underlying depression issues takes his medicine still struggles with depression but not as severe as what it was Not suicidal Has not had any esophageal varices bleeding Has not started his beta-blocker yet Staying away from alcohol Taking his medications for his diabetes hyperlipidemia as well as seeing the gastroenterologist near future Peripheral vascular disease claudication stable  Review of Systems     Objective:   Physical Exam General-in no acute distress Eyes-no discharge Lungs-respiratory rate normal, CTA CV-no murmurs,RRR Extremities skin warm dry no edema Neuro grossly normal Behavior normal, alert Abdomen mildly enlarged extremities trace edema       Assessment & Plan:  1. HTN (hypertension), benign Blood pressure good control on the low end.  Patient is going to be starting the carvedilol soon if it causes blood pressure go too low he will let us know and follow-up sooner  2. Cirrhosis of liver without ascites, unspecified hepatic cirrhosis type [facility]) Will see gastroenterology near future  3. Type 2 diabetes mellitus with diabetic neuropathy, without long-term current use of insulin (HCC) Stop glipizide risk  of low sugars Reduce metformin to 1 twice daily   4. Hyperlipidemia associated with type 2 diabetes mellitus ([place]) Patient states he takes statin 2 days a week continue this.  Follow-up later this year may HTN (hypertension), benign  Cirrhosis of liver without ascites, unspecified hepatic cirrhosis type ([place])  Type 2 diabetes mellitus with diabetic neuropathy, without long-term current use of insulin (HCC)  Hyperlipidemia associated with type 2 diabetes mellitus ([place])  Secondary esophageal 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)  Thrombocytopenia (HCC)  Depression, major, single episode, in partial remission ([place]), Chronic  PVD (peripheral vascular disease) with claudication (HCC), Chronic  Thrombocytopenia is stable No bleeding episodes currently Diabetes good control See above Depression stable currently taking his medication Peripheral vascular disease stable On his medication

## 2022-08-11 NOTE — Progress Notes (Deleted)
GI Office Note    Referring Provider: Kathyrn Drown, MD Primary Care Physician:  Kathyrn Drown, MD  Primary Gastroenterologist: Cristopher Estimable.Rourk, MD  Chief Complaint   No chief complaint on file.   History of Present Illness   Jim [name] is a 67 y.o. male presenting today at the request of Luking, Elayne Snare, MD for ***cirrhosis work up and hospital follow up.   EGD 2002: esophageal ulcer vs tear.   Colonoscopy 2013: Multiple adenomatous colon polyps. (Advised repeat in 3 years).   Seen during hospitalization 07/07/22-07/09/22 for epigastric abdominal pain/lower substernal chest pain.  Reported prior history of esophageal ulcer. 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 History of sleep apnea, not using CPAP. Denied NSAIDs. No melena or BRBPR. Labs revealed chronic mild elevation in ALT (48), normal lipase, Hgb 17.6 that dropped to 15.5. Mild thrombocytopenia with platelets 105 and down trended to 82. CT A/P as outlined below. Treated with PPI BID and EGD performed inpatient as outlined below. Previous workup negative hep b and C markers, normal ferritin, normal ceruloplasmin in 2019.   CT A/P 07/07/22: -No acute findings -subtle nodularity of liver contour (suggesting cirrhosis) -no suspicious focal abnormality of liver.  - no ductal dilation -splenomegaly -appendix mildly dilated to 10 mm without edema -left sided diverticulosis -mild lymphadenopathy in the hepatoduodenal ligament (consider repeat in 3 months)  EGD 07/08/22: -Grade 1/2 esophageal varices -gastritis s/p biopsy (mild chronic gastritis, negative H. Pylori) -normal duodenum -Advised PPI BID -Consider stopping HCTZ and start carvedilol 3.125 BID  Labs 07/15/22: A1c 6.4, alk phos 129, AST 33, ALT 51, tbili wnl. Labs 07/22/22: ANA, ASMA, AMA negative. Hep B surface antigen negative, Hep C Ab negative. Cologuard negative.   Today: Cirrhosis history Hematemesis/coffee ground emesis: History of variceal  bleeding: no Abdominal pain: Abdominal distention/worsening ascites:  Fever/chills:  Episodes of confusion/disorientation: Jaundice:  Number of daily bowel movements: Taking diuretics?: Date of last EGD: 07/08/22 Prior history of banding?: No Prior episodes of SBP: No Last time liver imaging was performed: 07/07/22 (grade 1/2 varices, started on low dose carvedilol)  MELD score: ***  Any hypotension?    Current Outpatient Medications  Medication Sig Dispense Refill   acetaminophen (TYLENOL) 650 MG CR tablet Take 1,300 mg by mouth daily as needed for pain.     albuterol (VENTOLIN HFA) 108 (90 Base) MCG/ACT inhaler Inhale 2 puffs into the lungs every 6 (six) hours as needed for wheezing. 1 each 6   aspirin EC 81 MG tablet Take 81 mg by mouth daily.     blood glucose meter kit and supplies Dispense based on patient and insurance preference. Use to check glucose once daily as directed. (FOR ICD-10 E11.9). 1 each 0   buPROPion (WELLBUTRIN XL) 150 MG 24 hr tablet TAKE 1 TABLET BY MOUTH EVERY DAY 90 tablet 1   carvedilol (COREG) 3.125 MG tablet TAKE 1 TABLET BY MOUTH TWICE A DAY WITH A MEAL 180 tablet 1   clopidogrel (PLAVIX) 75 MG tablet TAKE 1 TABLET BY MOUTH EVERY DAY 90 tablet 1   diclofenac Sodium (VOLTAREN) 1 % GEL Apply 2-4 g topically 4 (four) times daily.     ezetimibe (ZETIA) 10 MG tablet Take 1 tablet (10 mg total) by mouth daily. 90 tablet 1   FLUoxetine (PROZAC) 20 MG capsule TAKE 3 CAPSULES BY MOUTH EVERY DAY 270 capsule 1   gabapentin (NEURONTIN) 100 MG capsule Take 100 mg by mouth 2 (two) times daily.  JARDIANCE 10 MG TABS tablet TAKE 1 TABLET BY MOUTH DAILY BEFORE BREAKFAST. 90 tablet 1   Lancets (ONETOUCH ULTRASOFT) lancets      metFORMIN (GLUCOPHAGE) 500 MG tablet TAKE 1 TABLETS BY MOUTH EVERY MORNING AND EVENING 180 tablet 1   naloxone (NARCAN) nasal spray 4 mg/0.1 mL Use as directed 1 each 0   ONETOUCH VERIO test strip USE TO CHECK BLOOD SUGAR LEVELS ONCE DAILY 50  strip 0   OVER THE COUNTER MEDICATION Take 1 tablet by mouth daily.     Oxycodone HCl 20 MG TABS Take 1 tablet by mouth See admin instructions. Take 1 tablet by mouth 4-5 times daily if tolerated     pantoprazole (PROTONIX) 40 MG tablet Take 1 tablet (40 mg total) by mouth 2 (two) times daily. 60 tablet 1   rosuvastatin (CRESTOR) 5 MG tablet TAKE 1 TABLET ON MONDAY AND FRIDAY 24 tablet 3   tamsulosin (FLOMAX) 0.4 MG CAPS capsule TAKE 1 CAPSULE BY MOUTH EVERY DAY 90 capsule 1   traZODone (DESYREL) 50 MG tablet TAKE 1 TABLET BY MOUTH AT BEDTIME AS NEEDED FOR INSOMNIA 90 tablet 1   No current facility-administered medications for this visit.    Past Medical History:  Diagnosis Date   Arthritis    Back pain    Bursitis of hip    Bilateral   Depression    DJD (degenerative joint disease) of knee    Bilateral Left > Right   Esophageal ulcer    Ex-smoker    Quit 11/07/2011   Gastric ulcer    History of colon polyps    Hyperlipidemia    Primary localized osteoarthritis of right knee    PVD (peripheral vascular disease) with claudication ([place]) 05/21/2018   Under the care of vascular surgery    Past Surgical History:  Procedure Laterality Date   ABDOMINAL AORTOGRAM W/LOWER EXTREMITY N/A 05/19/2018   Procedure: ABDOMINAL AORTOGRAM W/LOWER EXTREMITY;  Surgeon: Elam Dutch, MD;  Location: [facility];  Service: Cardiovascular;  Laterality: N/A;   BIOPSY  07/08/2022   Procedure: BIOPSY;  Surgeon: Harvel Quale, MD;  [facility];  Service: Gastroenterology;;   CARPAL TUNNEL RELEASE     right   CARPAL TUNNEL RELEASE Left 05/16/13   Dr Noemi Chapel   COLONOSCOPY  01/31/2012   Procedure: COLONOSCOPY;  Surgeon: Daneil Dolin, MD;  [facility]: [facility];  Service: Endoscopy;  Laterality: N/A;  7:30 AM   ESOPHAGEAL DILATION N/A 07/08/2022   Procedure: ESOPHAGEAL DILATION;  Surgeon: Harvel Quale, MD;  [facility];  Service: Gastroenterology;   Laterality: N/A;   ESOPHAGOGASTRODUODENOSCOPY (EGD) WITH PROPOFOL N/A 07/08/2022   Procedure: ESOPHAGOGASTRODUODENOSCOPY (EGD) WITH PROPOFOL;  Surgeon: Harvel Quale, MD;  [facility];  Service: Gastroenterology;  Laterality: N/A;   FINGER AMPUTATION     partial left 5th finger   JOINT REPLACEMENT     KNEE ARTHROSCOPY     right knee   ORIF FINGER FRACTURE     Right 5th finger, Amputation reatachment   PERIPHERAL VASCULAR INTERVENTION Right 05/19/2018   Procedure: PERIPHERAL VASCULAR INTERVENTION;  Surgeon: Elam Dutch, MD;  Location: [facility];  Service: Cardiovascular;  Laterality: Right;  right popliteal   TONSILLECTOMY     TOTAL KNEE ARTHROPLASTY  09/04/2012   Procedure: TOTAL KNEE ARTHROPLASTY;  Surgeon: Lorn Junes, MD;  Location: [facility];  Service: Orthopedics;  Laterality: Left;   TOTAL KNEE ARTHROPLASTY Right  07/21/2015   Procedure: RIGHT TOTAL KNEE ARTHROPLASTY;  Surgeon: Elsie Saas, MD;  Location: [facility];  Service: Orthopedics;  Laterality: Right;    Family History  Problem Relation Age of Onset   Heart disease Mother    Heart disease Father    Diabetes type I Father    Hypertension Father    Cancer Father    Heart disease Sister    Heart disease Brother     Allergies as of 08/12/2022 - Review Complete 08/06/2022  Allergen Reaction Noted   Doxycycline Nausea Only 10/23/2012   Pravastatin Other (See Comments) 10/23/2012    Social History   Socioeconomic History   Marital status: Married    Spouse name: Not on file   Number of children: Not on file   Years of education: Not on file   Highest education level: Not on file  Occupational History   Occupation: disabled    Employer: OTHER  Tobacco Use   Smoking status: Former    Packs/day: 2.00    Years: 40.00    Total pack years: 80.00    Types: Cigarettes    Quit date: 10/31/2011    Years since quitting: 10.7   Smokeless tobacco: Never  Vaping Use   Vaping Use: Never  used  Substance and Sexual Activity   Alcohol use: Yes    Comment: 1 beer every 3-4 months   Drug use: No   Sexual activity: Yes  Other Topics Concern   Not on file  Social History Narrative   Not on file   Social Determinants of Health   Financial Resource Strain: Low Risk  (07/12/2022)   Overall Financial Resource Strain (CARDIA)    Difficulty of Paying Living Expenses: Not hard at all  Food Insecurity: No Food Insecurity (07/12/2022)   Hunger Vital Sign    Worried About Running Out of Food in the Last Year: Never true    Ran Out of Food in the Last Year: Never true  Transportation Needs: No Transportation Needs (07/12/2022)   PRAPARE - Hydrologist (Medical): No    Lack of Transportation (Non-Medical): No  Physical Activity: Insufficiently Active (05/31/2022)   Exercise Vital Sign    Days of Exercise per Week: 2 days    Minutes of Exercise per Session: 20 min  Stress: No Stress Concern Present (05/31/2022)   [place]    Feeling of Stress : Not at all  Social Connections: Moderately Isolated (05/31/2022)   Social Connection and Isolation Panel [NHANES]    Frequency of Communication with Friends and Family: More than three times a week    Frequency of Social Gatherings with Friends and Family: Once a week    Attends Religious Services: Never    Marine scientist or Organizations: No    Attends Archivist Meetings: Never    Marital Status: Married  Human resources officer Violence: Not At Risk (05/31/2022)   Humiliation, Afraid, Rape, and Kick questionnaire    Fear of Current or Ex-Partner: No    Emotionally Abused: No    Physically Abused: No    Sexually Abused: No     Review of Systems   Gen: Denies any fever, chills, fatigue, weight loss, lack of appetite.  CV: Denies chest pain, heart palpitations, peripheral edema, syncope.  Resp: Denies shortness of breath  at rest or with exertion. Denies wheezing or cough.  GI: see HPI GU : Denies urinary  burning, urinary frequency, urinary hesitancy MS: Denies joint pain, muscle weakness, cramps, or limitation of movement.  Derm: Denies rash, itching, dry skin Psych: Denies depression, anxiety, memory loss, and confusion Heme: Denies bruising, bleeding, and enlarged lymph nodes.   Physical Exam   There were no vitals taken for this visit.  General:   Alert and oriented. Pleasant and cooperative. Well-nourished and well-developed.  Head:  Normocephalic and atraumatic. Eyes:  Without icterus, sclera clear and conjunctiva pink.  Ears:  Normal auditory acuity. Mouth:  No deformity or lesions, oral mucosa pink.  Lungs:  Clear to auscultation bilaterally. No wheezes, rales, or rhonchi. No distress.  Heart:  S1, S2 present without murmurs appreciated.  Abdomen:  +BS, soft, non-tender and non-distended. No HSM noted. No guarding or rebound. No masses appreciated.  Rectal:  Deferred  Msk:  Symmetrical without gross deformities. Normal posture. Extremities:  Without edema. Neurologic:  Alert and  oriented x4;  grossly normal neurologically. Skin:  Intact without significant lesions or rashes. Psych:  Alert and cooperative. Normal mood and affect.   Assessment   Jim [name] is a 67 y.o. male with a history of arhtirits, HTN, HLD, daibetes, PVD, chronic pain syndrome, remote esophageal ulcer*** presenting today for hospital follow up and further work up of cirrhosis.    GERD, gastritis:Recent admission for epigastric pain. EGD with no bleeding varices and gastritis. PPI BID advised.   Cirrhosis: Could be multifactorial given history of alcohol use however less liekly given no heavy use in 40+ years. Likely MASLD (NAFLD) in the setting of HLD and diabetes. Chronic mild elevation in ALT. 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 Prior workup negative for iron  overload. Normal ceruloplasmin. Negative hepatitis B and C. Also negative autoimmune work up. Will assess for hepatitis A and check immunity for hep A and hep B. Also will obtain baseline AFP. Mild lymphadenopathy noted on recent imaging. Plan for repeat imaging in march for surveillance. Started on low dose carvedilol BID for variceal prophylaxis. Cirrhosis diet and education provided.    PLAN    AFP, Hep A ab total and IgM, Hep B core antibody, Hep B surface Ab,  Proceed with colonoscopy with propofol by Dr. Gala Romney in near future: the risks, benefits, and alternatives have been discussed with the patient in detail. The patient states understanding and desires to proceed. ASA 3 ***? CT A/P recall  for March for lymphadenopathy surveillance Continue carvedilol 3.125 mg BID High-protein diet from a primarily plant-based diet. Avoid red meat. No raw or undercooked meat, seafood, or shellfish. Low-fat/cholesterol/carbohydrate diet. Limit sodium to no more than 2000 mg/day including everything that you eat and drink. Recommend at least 30 minutes of aerobic and resistance exercise 3 days/week. Limit tylenol to 2000 mg daily.  Avoid NSAIDs Complete alcohol cessation *** Continue pantoprazole 40 mg BID    Venetia Night, MSN, FNP-BC, AGACNP-BC [facility]

## 2022-08-11 NOTE — Patient Instructions (Incomplete)
Nutrition:  High-protein diet from a primarily plant-based diet. Avoid red meat.  No raw or undercooked meat, seafood, or shellfish. Low-fat/cholesterol/carbohydrate diet. Limit sodium to no more than 2000 mg/day including everything that you eat and drink. Recommend at least 30 minutes of aerobic and resistance exercise 3 days/week. Limit tylenol to no more than '2000mg'$  daily.

## 2022-08-12 ENCOUNTER — Inpatient Hospital Stay: Payer: Medicare Other | Admitting: Gastroenterology

## 2022-08-23 DIAGNOSIS — M47816 Spondylosis without myelopathy or radiculopathy, lumbar region: Secondary | ICD-10-CM | POA: Diagnosis not present

## 2022-08-25 NOTE — Progress Notes (Unsigned)
GI Office Note    Referring Provider: Kathyrn Drown, MD Primary Care Physician:  Kathyrn Drown, MD  Primary Gastroenterologist: Cristopher Estimable.Rourk, MD  Chief Complaint   No chief complaint on file.   History of Present Illness   Jim [name] is a 67 y.o. male presenting today at the request of Luking, Elayne Snare, MD for ***hospital follow-up and further workup of cirrhosis.  EGD in 2002: Esophageal ulcer versus tear  Colonoscopy 2013: -Multiple adenomatous colon polyps -Advises repeat in 3 years  Recently seen by our GI service at [facility] 07/08/2022.  Patient presented with upper abdominal pain just below the sternum for 2 days.  Reported this is similar to when he had an esophageal ulcer in the past.  Also with dysphagia for 2 weeks.  Denied frequent NSAID use.  Denied any troubles with constipation, melena, or BRBPR.  Has Cologuard to complete at home.  Was due for surveillance colonoscopy in 2016.  Has history of OSA but does not use CPAP at home.  Had quit drinking alcohol in the 1970s/1980s.  Had labs in the ED with AST elevated at 48, lipase 35, hemoglobin 17.6, WBC 6000, platelets 105.  Had improvement the next day with normal LFTs, hemoglobin 15.5, and platelets decreased to 82,000.  He underwent CT A/P as noted below.  He was treated with IV PPI twice daily and underwent EGD 07/08/2022 as outlined below.  CT A/P with contrast: -Subtle nodularity of liver contour raising question for possible cirrhosis -Splenomegaly -Mild lymphadenopathy in the hepatoduodenal ligament likely reactive (follow-up CT in 3 months recommended) -Mild dilated appendix up to 2 mm in diameter without edema or inflammation  EGD 07/08/2022: -Grade 1/2 esophageal varices -Erythematous mucosa in the antrum s/p biopsy (mild chronic gastritis) -Normal duodenum -Advised PPI twice daily -Consider stopping hydrochlorothiazide and start carvedilol 3.125 mg twice daily  Labs 07/15/2022: Alk phos 129,  AST 33, ALT 51, albumin 4.3, T. bili 0.2.  Hemoglobin 15.9, platelets 112.  Hep C antibody negative, hep B surface antigen negative.  ANA negative, ASMA and AMA negative.  A1c 6.4  Today:  Tolerating beta-blocker?   Current Outpatient Medications  Medication Sig Dispense Refill   acetaminophen (TYLENOL) 650 MG CR tablet Take 1,300 mg by mouth daily as needed for pain.     albuterol (VENTOLIN HFA) 108 (90 Base) MCG/ACT inhaler Inhale 2 puffs into the lungs every 6 (six) hours as needed for wheezing. 1 each 6   aspirin EC 81 MG tablet Take 81 mg by mouth daily.     blood glucose meter kit and supplies Dispense based on patient and insurance preference. Use to check glucose once daily as directed. (FOR ICD-10 E11.9). 1 each 0   buPROPion (WELLBUTRIN XL) 150 MG 24 hr tablet TAKE 1 TABLET BY MOUTH EVERY DAY 90 tablet 1   carvedilol (COREG) 3.125 MG tablet TAKE 1 TABLET BY MOUTH TWICE A DAY WITH A MEAL 180 tablet 1   clopidogrel (PLAVIX) 75 MG tablet TAKE 1 TABLET BY MOUTH EVERY DAY 90 tablet 1   diclofenac Sodium (VOLTAREN) 1 % GEL Apply 2-4 g topically 4 (four) times daily.     ezetimibe (ZETIA) 10 MG tablet Take 1 tablet (10 mg total) by mouth daily. 90 tablet 1   FLUoxetine (PROZAC) 20 MG capsule TAKE 3 CAPSULES BY MOUTH EVERY DAY 270 capsule 1   gabapentin (NEURONTIN) 100 MG capsule Take 100 mg by mouth 2 (two) times daily.  JARDIANCE 10 MG TABS tablet TAKE 1 TABLET BY MOUTH DAILY BEFORE BREAKFAST. 90 tablet 1   Lancets (ONETOUCH ULTRASOFT) lancets      metFORMIN (GLUCOPHAGE) 500 MG tablet TAKE 1 TABLETS BY MOUTH EVERY MORNING AND EVENING 180 tablet 1   naloxone (NARCAN) nasal spray 4 mg/0.1 mL Use as directed 1 each 0   ONETOUCH VERIO test strip USE TO CHECK BLOOD SUGAR LEVELS ONCE DAILY 50 strip 0   OVER THE COUNTER MEDICATION Take 1 tablet by mouth daily.     Oxycodone HCl 20 MG TABS Take 1 tablet by mouth See admin instructions. Take 1 tablet by mouth 4-5 times daily if tolerated      pantoprazole (PROTONIX) 40 MG tablet Take 1 tablet (40 mg total) by mouth 2 (two) times daily. 60 tablet 1   rosuvastatin (CRESTOR) 5 MG tablet TAKE 1 TABLET ON MONDAY AND FRIDAY 24 tablet 3   tamsulosin (FLOMAX) 0.4 MG CAPS capsule TAKE 1 CAPSULE BY MOUTH EVERY DAY 90 capsule 1   traZODone (DESYREL) 50 MG tablet TAKE 1 TABLET BY MOUTH AT BEDTIME AS NEEDED FOR INSOMNIA 90 tablet 1   No current facility-administered medications for this visit.    Past Medical History:  Diagnosis Date   Arthritis    Back pain    Bursitis of hip    Bilateral   Depression    DJD (degenerative joint disease) of knee    Bilateral Left > Right   Esophageal ulcer    Ex-smoker    Quit 11/07/2011   Gastric ulcer    History of colon polyps    Hyperlipidemia    Primary localized osteoarthritis of right knee    PVD (peripheral vascular disease) with claudication ([place]) 05/21/2018   Under the care of vascular surgery    Past Surgical History:  Procedure Laterality Date   ABDOMINAL AORTOGRAM W/LOWER EXTREMITY N/A 05/19/2018   Procedure: ABDOMINAL AORTOGRAM W/LOWER EXTREMITY;  Surgeon: Elam Dutch, MD;  Location: [facility];  Service: Cardiovascular;  Laterality: N/A;   BIOPSY  07/08/2022   Procedure: BIOPSY;  Surgeon: Harvel Quale, MD;  [facility];  Service: Gastroenterology;;   CARPAL TUNNEL RELEASE     right   CARPAL TUNNEL RELEASE Left 05/16/13   Dr Noemi Chapel   COLONOSCOPY  01/31/2012   Procedure: COLONOSCOPY;  Surgeon: Daneil Dolin, MD;  Location: [facility];  Service: Endoscopy;  Laterality: N/A;  7:30 AM   ESOPHAGEAL DILATION N/A 07/08/2022   Procedure: ESOPHAGEAL DILATION;  Surgeon: Harvel Quale, MD;  [facility];  Service: Gastroenterology;  Laterality: N/A;   ESOPHAGOGASTRODUODENOSCOPY (EGD) WITH PROPOFOL N/A 07/08/2022   Procedure: ESOPHAGOGASTRODUODENOSCOPY (EGD) WITH PROPOFOL;  Surgeon: Harvel Quale, MD;  [facility];  Service: Gastroenterology;  Laterality: N/A;   FINGER AMPUTATION     partial left 5th finger   JOINT REPLACEMENT     KNEE ARTHROSCOPY     right knee   ORIF FINGER FRACTURE     Right 5th finger, Amputation reatachment   PERIPHERAL VASCULAR INTERVENTION Right 05/19/2018   Procedure: PERIPHERAL VASCULAR INTERVENTION;  Surgeon: Elam Dutch, MD;  Location: [facility];  Service: Cardiovascular;  Laterality: Right;  right popliteal   TONSILLECTOMY     TOTAL KNEE ARTHROPLASTY  09/04/2012   Procedure: TOTAL KNEE ARTHROPLASTY;  Surgeon: Lorn Junes, MD;  Location: [place];  Service: Orthopedics;  Laterality: Left;   TOTAL KNEE ARTHROPLASTY Right  07/21/2015   Procedure: RIGHT TOTAL KNEE ARTHROPLASTY;  Surgeon: Elsie Saas, MD;  Location: [place];  Service: Orthopedics;  Laterality: Right;    Family History  Problem Relation Age of Onset   Heart disease Mother    Heart disease Father    Diabetes type I Father    Hypertension Father    Cancer Father    Heart disease Sister    Heart disease Brother     Allergies as of 08/26/2022 - Review Complete 08/06/2022  Allergen Reaction Noted   Doxycycline Nausea Only 10/23/2012   Pravastatin Other (See Comments) 10/23/2012    Social History   Socioeconomic History   Marital status: Married    Spouse name: Not on file   Number of children: Not on file   Years of education: Not on file   Highest education level: Not on file  Occupational History   Occupation: disabled    Employer: OTHER  Tobacco Use   Smoking status: Former    Packs/day: 2.00    Years: 40.00    Total pack years: 80.00    Types: Cigarettes    Quit date: 10/31/2011    Years since quitting: 10.8   Smokeless tobacco: Never  Vaping Use   Vaping Use: Never used  Substance and Sexual Activity   Alcohol use: Yes    Comment: 1 beer every 3-4 months   Drug use: No   Sexual activity: Yes  Other Topics Concern   Not on file  Social History Narrative    Not on file   Social Determinants of Health   Financial Resource Strain: Low Risk  (07/12/2022)   Overall Financial Resource Strain (CARDIA)    Difficulty of Paying Living Expenses: Not hard at all  Food Insecurity: No Food Insecurity (07/12/2022)   Hunger Vital Sign    Worried About Running Out of Food in the Last Year: Never true    Ran Out of Food in the Last Year: Never true  Transportation Needs: No Transportation Needs (07/12/2022)   PRAPARE - Hydrologist (Medical): No    Lack of Transportation (Non-Medical): No  Physical Activity: Insufficiently Active (05/31/2022)   Exercise Vital Sign    Days of Exercise per Week: 2 days    Minutes of Exercise per Session: 20 min  Stress: No Stress Concern Present (05/31/2022)   [place]    Feeling of Stress : Not at all  Social Connections: Moderately Isolated (05/31/2022)   Social Connection and Isolation Panel [NHANES]    Frequency of Communication with Friends and Family: More than three times a week    Frequency of Social Gatherings with Friends and Family: Once a week    Attends Religious Services: Never    Marine scientist or Organizations: No    Attends Archivist Meetings: Never    Marital Status: Married  Human resources officer Violence: Not At Risk (05/31/2022)   Humiliation, Afraid, Rape, and Kick questionnaire    Fear of Current or Ex-Partner: No    Emotionally Abused: No    Physically Abused: No    Sexually Abused: No     Review of Systems   Gen: Denies any fever, chills, fatigue, weight loss, lack of appetite.  CV: Denies chest pain, heart palpitations, peripheral edema, syncope.  Resp: Denies shortness of breath at rest or with exertion. Denies wheezing or cough.  GI: see HPI GU : Denies urinary burning,  urinary frequency, urinary hesitancy MS: Denies joint pain, muscle weakness, cramps, or limitation of  movement.  Derm: Denies rash, itching, dry skin Psych: Denies depression, anxiety, memory loss, and confusion Heme: Denies bruising, bleeding, and enlarged lymph nodes.   Physical Exam   There were no vitals taken for this visit.  General:   Alert and oriented. Pleasant and cooperative. Well-nourished and well-developed.  Head:  Normocephalic and atraumatic. Eyes:  Without icterus, sclera clear and conjunctiva pink.  Ears:  Normal auditory acuity. Mouth:  No deformity or lesions, oral mucosa pink.  Lungs:  Clear to auscultation bilaterally. No wheezes, rales, or rhonchi. No distress.  Heart:  S1, S2 present without murmurs appreciated.  Abdomen:  +BS, soft, non-tender and non-distended. No HSM noted. No guarding or rebound. No masses appreciated.  Rectal:  Deferred *** Msk:  Symmetrical without gross deformities. Normal posture. Extremities:  Without edema. Neurologic:  Alert and  oriented x4;  grossly normal neurologically. Skin:  Intact without significant lesions or rashes. Psych:  Alert and cooperative. Normal mood and affect.   Assessment   Jim [name] is a 67 y.o. male with a history of arthritis, HTN, HLD, diabetes, PVD, chronic pain syndrome, esophageal ulcer in 2002*** presenting today for further workup of cirrhosis and hospital follow-up.  History of adenomatous colon polyps: Last colonoscopy in 2013 with multiple tubular adenomas.  Currently overdue for surveillance as 3-year repeat was recommended.  Recent Cologuard - 07/19/2022  Cirrhosis: Recent CT A/P in December 2023 with subtle nodularity of liver contour representing likely cirrhosis.  Evidence of portal hypertension given splenomegaly on imaging as well as grade 1/2 esophageal varices on EGD in December while inpatient.  Patient negative for hepatitis B and hepatitis C.  Other serologic workup also negative for ANA, AMA, and ASMA.  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  Will obtain baseline AFP and recheck HFP and iron panel.***Will continue on carvedilol 3.125 mg twice daily for variceal prophylaxis.  GERD, gastritis: EGD in December with chronic reactive gastritis.  Biopsies negative for H. pylori.  Currently maintained on pantoprazole 40 mg twice daily  PLAN   *** Continue carvedilol 3.125 mg twice daily Continue pantoprazole 40 mg twice daily HFP, iron panel, AFP RUQ [place] in 6 months MELD labs in 6 months Cirrhosis diet*** No repeat EGD if tolerating beta-blocker*** Follow-up in 6 months   Venetia Night, MSN, FNP-BC, AGACNP-BC [facility], Esophageal ulcer in 2002

## 2022-08-26 ENCOUNTER — Ambulatory Visit (INDEPENDENT_AMBULATORY_CARE_PROVIDER_SITE_OTHER): Payer: 59 | Admitting: Gastroenterology

## 2022-08-26 ENCOUNTER — Encounter: Payer: Self-pay | Admitting: Gastroenterology

## 2022-08-26 VITALS — BP 109/75 | HR 80 | Temp 98.2°F | Ht 68.0 in | Wt 206.9 lb

## 2022-08-26 DIAGNOSIS — K2970 Gastritis, unspecified, without bleeding: Secondary | ICD-10-CM | POA: Diagnosis not present

## 2022-08-26 DIAGNOSIS — K2990 Gastroduodenitis, unspecified, without bleeding: Secondary | ICD-10-CM

## 2022-08-26 DIAGNOSIS — K7460 Unspecified cirrhosis of liver: Secondary | ICD-10-CM

## 2022-08-26 NOTE — Patient Instructions (Signed)
I am ordering labs for you to have completed at [facility] at your convenience within the next 1 to 2 weeks.  Nutrition:  High-protein diet from a primarily plant-based diet. Avoid red meat.  No raw or undercooked meat, seafood, or shellfish. Low-fat/cholesterol/carbohydrate diet. Limit sodium to no more than 2000 mg/day including everything that you eat and drink. Recommend at least 30 minutes of aerobic and resistance exercise 3 days/week.  Will plan to repeat labs and obtain an ultrasound in 6 months prior to your next visit.  Will plan to see you in the office in 6 months, or sooner if needed.  Please notify began having any uncontrolled lower extremity edema, swelling in her abdomen, or any change in bowel habits.  It was a pleasure to see you again today!  I want to create trusting relationships with patients. If you receive a survey regarding your visit,  I greatly appreciate you taking time to fill this out on paper or through your MyChart. I value your feedback.  Venetia Night, MSN, FNP-BC, AGACNP-BC [facility]

## 2022-08-27 LAB — IRON,TIBC AND FERRITIN PANEL
Ferritin: 77 ng/mL (ref 30–400)
Iron Saturation: 34 % (ref 15–55)
Iron: 130 ug/dL (ref 38–169)
Total Iron Binding Capacity: 383 ug/dL (ref 250–450)
UIBC: 253 ug/dL (ref 111–343)

## 2022-08-27 LAB — HEPATIC FUNCTION PANEL
ALT: 62 IU/L — ABNORMAL HIGH (ref 0–44)
AST: 29 IU/L (ref 0–40)
Albumin: 4.6 g/dL (ref 3.9–4.9)
Alkaline Phosphatase: 96 IU/L (ref 44–121)
Bilirubin Total: 0.4 mg/dL (ref 0.0–1.2)
Bilirubin, Direct: 0.12 mg/dL (ref 0.00–0.40)
Total Protein: 6.8 g/dL (ref 6.0–8.5)

## 2022-08-27 LAB — AFP TUMOR MARKER: AFP, Serum, Tumor Marker: 4.7 ng/mL (ref 0.0–8.4)

## 2022-08-30 ENCOUNTER — Other Ambulatory Visit: Payer: Self-pay | Admitting: Family Medicine

## 2022-08-31 DIAGNOSIS — M7061 Trochanteric bursitis, right hip: Secondary | ICD-10-CM | POA: Diagnosis not present

## 2022-08-31 DIAGNOSIS — M5416 Radiculopathy, lumbar region: Secondary | ICD-10-CM | POA: Diagnosis not present

## 2022-09-02 ENCOUNTER — Ambulatory Visit: Payer: Medicare Other | Admitting: Family Medicine

## 2022-09-03 DIAGNOSIS — M7061 Trochanteric bursitis, right hip: Secondary | ICD-10-CM | POA: Diagnosis not present

## 2022-09-03 DIAGNOSIS — M5416 Radiculopathy, lumbar region: Secondary | ICD-10-CM | POA: Diagnosis not present

## 2022-09-10 DIAGNOSIS — M545 Low back pain, unspecified: Secondary | ICD-10-CM | POA: Diagnosis not present

## 2022-09-13 DIAGNOSIS — M545 Low back pain, unspecified: Secondary | ICD-10-CM | POA: Diagnosis not present

## 2022-09-19 ENCOUNTER — Telehealth: Payer: Self-pay | Admitting: Family Medicine

## 2022-09-19 NOTE — Telephone Encounter (Signed)
Nurses-please let patient know that we received notification from Dr. Noemi Chapel group specifically Dr.Ibazebo that with the procedure they will be doing me he will need to stop his Plavix ahead of time  1.  Make sure patient agrees with this 2.  If he agrees we recommend stopping Plavix 6 days before the procedure.  If the procedure goes well he can resume the medication the day after. #3 there is always a theoretical risk that stopping Plavix could cause blood clot within his peripheral vascular disease but it is unlikely to trigger a problem and in his situations it does seem that the procedure benefit outweighs the negative

## 2022-09-20 NOTE — Telephone Encounter (Signed)
Pt contacted and agrees to stop Plavix 6 days before procedure. Pt verbalized understanding.

## 2022-09-22 DIAGNOSIS — M47816 Spondylosis without myelopathy or radiculopathy, lumbar region: Secondary | ICD-10-CM | POA: Diagnosis not present

## 2022-09-27 DIAGNOSIS — G5602 Carpal tunnel syndrome, left upper limb: Secondary | ICD-10-CM | POA: Diagnosis not present

## 2022-09-27 DIAGNOSIS — M5030 Other cervical disc degeneration, unspecified cervical region: Secondary | ICD-10-CM | POA: Diagnosis not present

## 2022-09-27 DIAGNOSIS — M47816 Spondylosis without myelopathy or radiculopathy, lumbar region: Secondary | ICD-10-CM | POA: Diagnosis not present

## 2022-09-27 DIAGNOSIS — G894 Chronic pain syndrome: Secondary | ICD-10-CM | POA: Diagnosis not present

## 2022-10-07 DIAGNOSIS — M48061 Spinal stenosis, lumbar region without neurogenic claudication: Secondary | ICD-10-CM | POA: Diagnosis not present

## 2022-10-22 DIAGNOSIS — M47816 Spondylosis without myelopathy or radiculopathy, lumbar region: Secondary | ICD-10-CM | POA: Diagnosis not present

## 2022-10-25 DIAGNOSIS — I10 Essential (primary) hypertension: Secondary | ICD-10-CM | POA: Diagnosis not present

## 2022-11-01 ENCOUNTER — Ambulatory Visit (INDEPENDENT_AMBULATORY_CARE_PROVIDER_SITE_OTHER): Payer: 59 | Admitting: Family Medicine

## 2022-11-01 VITALS — BP 116/71 | HR 65 | Wt 215.0 lb

## 2022-11-01 DIAGNOSIS — I10 Essential (primary) hypertension: Secondary | ICD-10-CM | POA: Diagnosis not present

## 2022-11-01 DIAGNOSIS — E1140 Type 2 diabetes mellitus with diabetic neuropathy, unspecified: Secondary | ICD-10-CM

## 2022-11-01 DIAGNOSIS — M79661 Pain in right lower leg: Secondary | ICD-10-CM | POA: Diagnosis not present

## 2022-11-01 DIAGNOSIS — R7989 Other specified abnormal findings of blood chemistry: Secondary | ICD-10-CM | POA: Diagnosis not present

## 2022-11-01 DIAGNOSIS — E785 Hyperlipidemia, unspecified: Secondary | ICD-10-CM

## 2022-11-01 DIAGNOSIS — E1169 Type 2 diabetes mellitus with other specified complication: Secondary | ICD-10-CM

## 2022-11-01 DIAGNOSIS — M79662 Pain in left lower leg: Secondary | ICD-10-CM | POA: Diagnosis not present

## 2022-11-01 DIAGNOSIS — I739 Peripheral vascular disease, unspecified: Secondary | ICD-10-CM | POA: Diagnosis not present

## 2022-11-01 DIAGNOSIS — M791 Myalgia, unspecified site: Secondary | ICD-10-CM | POA: Diagnosis not present

## 2022-11-01 NOTE — Progress Notes (Signed)
   Subjective:    Patient ID: Jim [name], male    DOB: [birth year], 67 y.o.   MRN: [id number]  HPI Patient arrives today with pain in both legs knee down. Patient states sharp pain while walking. Ice or heat does not ease the pain.   Patient relates bilateral leg pains from the knees downward and feels like aching throbbing it is worse with certain movements he cannot walk any significant distance without increased pain and discomfort he also relates tightness in his calf muscles and tenderness on both sides.  He states his feet at times feel cool but he denies them being blue no ulcers or sores.  He does not know of any injury.  Denies sciatica symptoms.  He is already on chronic pain medicine for joint related discomforts and spinal issues  He does have underlying diabetes hypertension hyperlipidemia he has a follow-up visit coming for this Review of Systems     Objective:   Physical Exam General-in no acute distress Eyes-no discharge Lungs-respiratory rate normal, CTA CV-no murmurs,RRR Extremities skin warm dry no edema Neuro grossly normal Behavior normal, alert # Patient diminished pulses in the feet bilaterally extremities no edema  There is some concern that I have about the possibility of this being peripheral artery disease we need to get up-to-date ultrasound he needs referral to vascular surgery but at the same time we need to rule out muscle related issues as well as blood clots so therefore additional labs     Assessment & Plan:   1. HTN (hypertension), benign HTN- patient seen for follow-up regarding HTN.   Diet, medication compliance, appropriate labs and refills were completed.   Importance of keeping blood pressure under good control to lessen the risk of complications discussed Regular follow-up visits discussed   2. Hyperlipidemia associated with type 2 diabetes mellitus Hyperlipidemia-importance of diet, weight control, activity, compliance with medications  discussed.   Recent labs reviewed.   Any additional labs or refills ordered.   Importance of keeping under good control discussed. Regular follow-up visits discussed  - Lipid panel  3. Type 2 diabetes mellitus with diabetic neuropathy, without long-term current use of insulin The patient was seen today as part of a comprehensive visit for diabetes. The importance of keeping her A1c at or below 7 range was discussed.  Discussed diet, activity, and medication compliance Emphasized healthy eating primarily with vegetables fruits and if utilizing meats lean meats such as chicken or fish grilled baked broiled Avoid sugary drinks Minimize and avoid processed foods Fit in regular physical activity preferably 25 to 30 minutes 4 times per week Standard follow-up visit recommended.  Patient aware lack of control and follow-up increases risk of diabetic complications. Regular follow-up visits Yearly ophthalmology Yearly foot exam  - Hemoglobin A1c  4. PAD (peripheral artery disease) Very important to see Dr. Oneida Alar.  Needs further evaluation including up-to-date ultrasounds of arterial area concerning for the possibility of vascular ischemia insufficiency but no ulcers are seen no blueness - Ambulatory Referral for Peripheral Vascular Consult  5. Myalgia Lab work ordered await results lab work ordered - D-dimer, quantitative - C-reactive protein - CK - Ambulatory Referral for Peripheral Vascular Consult  6. Bilateral calf pain Lab work ordered await results need to rule out blood clot the likelihood is low but we will wait a D-dimer - D-dimer, quantitative - C-reactive protein - CK - Ambulatory Referral for Peripheral Vascular Consult

## 2022-11-02 ENCOUNTER — Ambulatory Visit (HOSPITAL_COMMUNITY)
Admission: RE | Admit: 2022-11-02 | Discharge: 2022-11-02 | Disposition: A | Discharge status: Home / Self Care | Payer: 59 | Source: Ambulatory Visit | Attending: Family Medicine | Admitting: Family Medicine

## 2022-11-02 ENCOUNTER — Telehealth: Payer: Self-pay

## 2022-11-02 ENCOUNTER — Telehealth: Payer: Self-pay | Admitting: Family Medicine

## 2022-11-02 DIAGNOSIS — M79661 Pain in right lower leg: Secondary | ICD-10-CM | POA: Diagnosis not present

## 2022-11-02 DIAGNOSIS — M79662 Pain in left lower leg: Secondary | ICD-10-CM | POA: Diagnosis not present

## 2022-11-02 DIAGNOSIS — R7989 Other specified abnormal findings of blood chemistry: Secondary | ICD-10-CM | POA: Insufficient documentation

## 2022-11-02 LAB — HEMOGLOBIN A1C
Est. average glucose Bld gHb Est-mCnc: 171 mg/dL
Hgb A1c MFr Bld: 7.6 % — ABNORMAL HIGH (ref 4.8–5.6)

## 2022-11-02 LAB — LIPID PANEL
Chol/HDL Ratio: 3.7 ratio (ref 0.0–5.0)
Cholesterol, Total: 181 mg/dL (ref 100–199)
HDL: 49 mg/dL (ref >39)
LDL Chol Calc (NIH): 99 mg/dL (ref 0–99)
Triglycerides: 191 mg/dL — ABNORMAL HIGH (ref 0–149)
VLDL Cholesterol Cal: 33 mg/dL (ref 5–40)

## 2022-11-02 LAB — CK: Total CK: 59 U/L (ref 41–331)

## 2022-11-02 LAB — D-DIMER, QUANTITATIVE: D-DIMER: 0.67 mg/L FEU — ABNORMAL HIGH (ref 0.00–0.49)

## 2022-11-02 LAB — C-REACTIVE PROTEIN: CRP: 25 mg/L — ABNORMAL HIGH (ref 0–10)

## 2022-11-02 NOTE — Telephone Encounter (Signed)
Patient scheduled 11/02/22 at 10:30AM at [facility]. Patient aware.

## 2022-11-02 NOTE — Telephone Encounter (Signed)
U/S ordered STAT in [facility]- Referral coordinator notified and is currently working on getting approved and scheduled and will contact patient with appointment.

## 2022-11-02 NOTE — Telephone Encounter (Signed)
Patient scheduled 11/02/22 at 10:30AM at [facility]. Patient aware.

## 2022-11-02 NOTE — Addendum Note (Signed)
Addended by: Dairl Ponder on: 11/02/2022 09:11 AM   Modules accepted: Orders

## 2022-11-02 NOTE — Telephone Encounter (Signed)
Please see result note-needs ultrasound of legs venous-bilateral-preferably Tuesday or Wednesday due to elevated D-dimer as well as bilateral calf pain

## 2022-11-02 NOTE — Telephone Encounter (Signed)
U/S ordered STAT in [facility]- Referral coordinator notified and is currently working on getting approved and scheduled and will contact patient with appointment.

## 2022-11-02 NOTE — Telephone Encounter (Signed)
Pt called wanting to find out when the ultra sound will be scheduled his labs come back D-dimer mildly elevated and Dr Nicki Reaper has recommended a ultrasound both legs to rule out DVT needed to be completed today or tomorrow?   Sanjuana Kava [phone]

## 2022-11-02 NOTE — Addendum Note (Signed)
Addended by: Dairl Ponder on: 11/02/2022 04:48 PM   Modules accepted: Orders

## 2022-11-09 DIAGNOSIS — D239 Other benign neoplasm of skin, unspecified: Secondary | ICD-10-CM | POA: Diagnosis not present

## 2022-11-19 DIAGNOSIS — M48061 Spinal stenosis, lumbar region without neurogenic claudication: Secondary | ICD-10-CM | POA: Diagnosis not present

## 2022-11-22 ENCOUNTER — Other Ambulatory Visit: Payer: Self-pay | Admitting: Family Medicine

## 2022-11-22 DIAGNOSIS — I10 Essential (primary) hypertension: Secondary | ICD-10-CM

## 2022-11-22 DIAGNOSIS — M5030 Other cervical disc degeneration, unspecified cervical region: Secondary | ICD-10-CM | POA: Diagnosis not present

## 2022-11-22 DIAGNOSIS — Z79891 Long term (current) use of opiate analgesic: Secondary | ICD-10-CM | POA: Diagnosis not present

## 2022-11-22 DIAGNOSIS — R350 Frequency of micturition: Secondary | ICD-10-CM

## 2022-11-22 DIAGNOSIS — E1140 Type 2 diabetes mellitus with diabetic neuropathy, unspecified: Secondary | ICD-10-CM

## 2022-11-22 DIAGNOSIS — G894 Chronic pain syndrome: Secondary | ICD-10-CM | POA: Diagnosis not present

## 2022-11-24 ENCOUNTER — Other Ambulatory Visit: Payer: Self-pay

## 2022-11-24 DIAGNOSIS — I739 Peripheral vascular disease, unspecified: Secondary | ICD-10-CM

## 2022-11-27 NOTE — Telephone Encounter (Signed)
Nurses Received a form from Jim [name] requesting permission for him to stop his Plavix-clopidogrel gel 7 days prior to his lumbar injection  He would have to be off of the Plavix for 7 days prior to the injection.  Theoretically there is a risk that this could cause problems with his peripheral vascular disease but the likelihood of that happening is very low.  He would have to be off the Plavix in order to get the injection.  Please touch base with the patient to make sure that he has given verbal consent for [place] to recommend this  Once he gives his verbal consent for [place] to recommend this to his orthopedist please document that the above was talked with him.  Then send the message back to me so I can finish out the form thank you

## 2022-11-29 NOTE — Telephone Encounter (Signed)
Patient advised of provider's recommendations and stated he is aware of the risk of holding the Plavix but would like to proceed with the injection. Patient states he has done this many times before for previous spinal injections without an issue.

## 2022-11-30 ENCOUNTER — Encounter: Payer: Self-pay | Admitting: Family Medicine

## 2022-11-30 NOTE — Telephone Encounter (Signed)
Please fax this letter to [facility]-thank you

## 2022-11-30 NOTE — Progress Notes (Signed)
Please fax this to [facility] thank you

## 2022-12-01 ENCOUNTER — Ambulatory Visit (INDEPENDENT_AMBULATORY_CARE_PROVIDER_SITE_OTHER): Payer: 59 | Admitting: Vascular Surgery

## 2022-12-01 ENCOUNTER — Ambulatory Visit (INDEPENDENT_AMBULATORY_CARE_PROVIDER_SITE_OTHER): Payer: 59

## 2022-12-01 ENCOUNTER — Encounter: Payer: Self-pay | Admitting: Vascular Surgery

## 2022-12-01 VITALS — BP 131/83 | HR 57 | Temp 97.4°F | Ht 68.0 in | Wt 216.0 lb

## 2022-12-01 DIAGNOSIS — I70213 Atherosclerosis of native arteries of extremities with intermittent claudication, bilateral legs: Secondary | ICD-10-CM

## 2022-12-01 DIAGNOSIS — I739 Peripheral vascular disease, unspecified: Secondary | ICD-10-CM

## 2022-12-01 LAB — VAS US ABI WITH/WO TBI
Left ABI: 0.76
Right ABI: 0.57

## 2022-12-01 NOTE — Progress Notes (Signed)
Vascular and Vein Specialist of [place]  Patient name: Jim [name] MRN: [id number] DOB: 21-Oct-1955 Sex: male  REASON FOR CONSULT: Evaluation intermittent claudication  HPI: Jim [name] is a 67 y.o. male, who is here today for evaluation of right greater than left leg intermittent claudication.  He was seen in our office in 2019 with similar symptoms.  He underwent arteriography at [facility] on 05/19/2018.  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  He had successful crossing of this occlusion and angioplasty and stenting of the lesion.  He did not return for follow-up.  He presents now with progressive claudication symptoms.  He reports that the right leg is worse than the left.  He reports that this makes it difficult for him to do his daily activities even around the house.  He reports that he enjoys doing yard work and gardening and is unable to do this due to his claudication symptoms.  This is calf claudication only.  He does not have any tissue loss.  Past Medical History:  Diagnosis Date   Arthritis    Back pain    Bursitis of hip    Bilateral   Depression    DJD (degenerative joint disease) of knee    Bilateral Left > Right   Esophageal ulcer    Ex-smoker    Quit 11/07/2011   Gastric ulcer    History of colon polyps    Hyperlipidemia    Primary localized osteoarthritis of right knee    PVD (peripheral vascular disease) with claudication (HCC) 05/21/2018   Under the care of vascular surgery    Family History  Problem Relation Age of Onset   Heart disease Mother    Heart disease Father    Diabetes type I Father    Hypertension Father    Cancer Father    Heart disease Sister    Heart disease Brother     SOCIAL HISTORY: Social History   Socioeconomic History   Marital status: Married    Spouse name: Not on  file   Number of children: Not on file   Years of education: Not on file   Highest education level: 12th grade  Occupational History   Occupation: disabled    Employer: OTHER  Tobacco Use   Smoking status: Former    Packs/day: 2.00    Years: 40.00    Additional pack years: 0.00    Total pack years: 80.00    Types: Cigarettes    Quit date: 10/31/2011    Years since quitting: 11.0    Passive exposure: Current   Smokeless tobacco: Never  Vaping Use   Vaping Use: Never used  Substance and Sexual Activity   Alcohol use: Not Currently   Drug use: No   Sexual activity: Yes  Other Topics Concern   Not on file  Social History Narrative   Not on file   Social Determinants of Health   Financial Resource Strain: Patient Declined (11/01/2022)   Overall Financial Resource Strain (CARDIA)    Difficulty of Paying Living Expenses: Patient declined  Food Insecurity: Food Insecurity Present (11/01/2022)   Hunger Vital Sign    Worried About Running Out of Food in the Last Year: Sometimes true    Ran Out of Food in the Last Year: Never true  Transportation Needs: No Transportation Needs (11/01/2022)   PRAPARE -  Administrator, Civil Service (Medical): No    Lack of Transportation (Non-Medical): No  Physical Activity: Inactive (11/01/2022)   Exercise Vital Sign    Days of Exercise per Week: 0 days    Minutes of Exercise per Session: 20 min  Stress: No Stress Concern Present (11/01/2022)   [employer] of Occupational Health - Occupational Stress Questionnaire    Feeling of Stress : Only a little  Social Connections: Moderately Isolated (11/01/2022)   Social Connection and Isolation Panel [NHANES]    Frequency of Communication with Friends and Family: More than three times a week    Frequency of Social Gatherings with Friends and Family: More than three times a week    Attends Religious Services: Never    Database administrator or Organizations: No    Attends Banker  Meetings: Never    Marital Status: Married  Catering manager Violence: Not At Risk (05/31/2022)   Humiliation, Afraid, Rape, and Kick questionnaire    Fear of Current or Ex-Partner: No    Emotionally Abused: No    Physically Abused: No    Sexually Abused: No    Allergies  Allergen Reactions   Doxycycline Nausea Only   Pravastatin Other (See Comments)    myalgia    Current Outpatient Medications  Medication Sig Dispense Refill   albuterol (VENTOLIN HFA) 108 (90 Base) MCG/ACT inhaler Inhale 2 puffs into the lungs every 6 (six) hours as needed for wheezing. 1 each 6   aspirin EC 81 MG tablet Take 81 mg by mouth daily.     blood glucose meter kit and supplies Dispense based on patient and insurance preference. Use to check glucose once daily as directed. (FOR ICD-10 E11.9). 1 each 0   buPROPion (WELLBUTRIN XL) 150 MG 24 hr tablet TAKE 1 TABLET BY MOUTH EVERY DAY 90 tablet 1   carvedilol (COREG) 3.125 MG tablet TAKE 1 TABLET BY MOUTH TWICE A DAY WITH A MEAL 180 tablet 1   clopidogrel (PLAVIX) 75 MG tablet TAKE 1 TABLET BY MOUTH EVERY DAY 90 tablet 1   diclofenac Sodium (VOLTAREN) 1 % GEL Apply 2-4 g topically 4 (four) times daily.     ezetimibe (ZETIA) 10 MG tablet TAKE 1 TABLET BY MOUTH EVERY DAY 90 tablet 1   FLUoxetine (PROZAC) 20 MG capsule TAKE 3 CAPSULES BY MOUTH EVERY DAY 270 capsule 1   gabapentin (NEURONTIN) 400 MG capsule Take 400 mg by mouth 2 (two) times daily.     hydrochlorothiazide (MICROZIDE) 12.5 MG capsule Take 12.5 mg by mouth daily.     JARDIANCE 10 MG TABS tablet TAKE 1 TABLET BY MOUTH DAILY BEFORE BREAKFAST. 90 tablet 1   Lancets (ONETOUCH ULTRASOFT) lancets      lisinopril (ZESTRIL) 5 MG tablet Take 5 mg by mouth daily.     metFORMIN (GLUCOPHAGE) 500 MG tablet TAKE 1 TABLETS BY MOUTH EVERY MORNING AND EVENING 180 tablet 1   naloxone (NARCAN) nasal spray 4 mg/0.1 mL Use as directed 1 each 0   ONETOUCH VERIO test strip USE TO CHECK BLOOD SUGAR LEVELS ONCE DAILY 50  strip 0   Oxycodone HCl 20 MG TABS Take 1 tablet by mouth See admin instructions. Take 1 tablet by mouth 4-5 times daily if tolerated     pantoprazole (PROTONIX) 40 MG tablet Take 1 tablet (40 mg total) by mouth 2 (two) times daily. 60 tablet 1   rosuvastatin (CRESTOR) 5 MG tablet TAKE 1 TABLET ON MONDAY  AND FRIDAY 24 tablet 3   sitaGLIPtin (JANUVIA) 50 MG tablet Take 50 mg by mouth daily.     tamsulosin (FLOMAX) 0.4 MG CAPS capsule TAKE 1 CAPSULE BY MOUTH EVERY DAY 90 capsule 1   traZODone (DESYREL) 50 MG tablet TAKE 1 TABLET BY MOUTH AT BEDTIME AS NEEDED FOR INSOMNIA 90 tablet 1   No current facility-administered medications for this visit.    REVIEW OF SYSTEMS:  [X]  denotes positive finding, [ ]  denotes negative finding Cardiac  Comments:  Chest pain or chest pressure:    Shortness of breath upon exertion:    Short of breath when lying flat:    Irregular heart rhythm:        Vascular    Pain in calf, thigh, or hip brought on by ambulation: x   Pain in feet at night that wakes you up from your sleep:  x   Blood clot in your veins:    Leg swelling:  x       Pulmonary    Oxygen at home:    Productive cough:     Wheezing:  x       Neurologic    Sudden weakness in arms or legs:  x   Sudden numbness in arms or legs:  x   Sudden onset of difficulty speaking or slurred speech:    Temporary loss of vision in one eye:     Problems with dizziness:         Gastrointestinal    Blood in stool:     Vomited blood:         Genitourinary    Burning when urinating:     Blood in urine:        Psychiatric    Major depression:  x       Hematologic    Bleeding problems:    Problems with blood clotting too easily:        Skin    Rashes or ulcers:        Constitutional    Fever or chills:      PHYSICAL EXAM: Vitals:   12/01/22 1030  BP: 131/83  Pulse: (!) 57  Temp: (!) 97.4 F (36.3 C)  SpO2: 95%  Weight: 216 lb (98 kg)  Height: 5\' 8"  (1.727 m)    GENERAL: The patient  is a well-nourished male, in no acute distress. The vital signs are documented above. CARDIOVASCULAR: 2+ radial pulses.  2+ femoral pulses bilaterally.  Absent popliteal and distal pulses bilaterally PULMONARY: There is good air exchange  MUSCULOSKELETAL: There are no major deformities or cyanosis. NEUROLOGIC: No focal weakness or paresthesias are detected. SKIN: There are no ulcers or rashes noted. PSYCHIATRIC: The patient has a normal affect.  DATA:  Noninvasive studies reveal ankle arm index of 0.57 on the right with monophasic flow and 0.76 on the left with monophasic flow  MEDICAL ISSUES: Discussed these findings at length with the patient.  Explained that he appears to have had recurrent disease in his right leg and progression on the left.  I explained that this is not limb threatening.  I explained that it would be safe to continue his walking program.  He reports that he is severely limited and is not comfortable with this level of claudication.  I did discuss the option of arteriography and possible reintervention.  Also explained that in all likelihood this may require femoral-popliteal bypass on the right for symptom resolution.  He wishes to proceed with  outpatient arteriography and possible endovascular treatment for his severe superficial femoral artery occlusive disease.  We will coordinate this as an outpatient at his convenience   Larina Earthly, MD [facility] Special[facility] [address] Tel [phone] Pager [phone]  Note: Portions of this report may have been transcribed using voice recognition software.  Every effort has been made to ensure accuracy; however, inadvertent computerized transcription errors may still be present.

## 2022-12-02 DIAGNOSIS — M7522 Bicipital tendinitis, left shoulder: Secondary | ICD-10-CM | POA: Diagnosis not present

## 2022-12-06 ENCOUNTER — Telehealth: Payer: Self-pay

## 2022-12-06 ENCOUNTER — Ambulatory Visit: Payer: Medicare Other | Admitting: Family Medicine

## 2022-12-06 ENCOUNTER — Other Ambulatory Visit: Payer: Self-pay

## 2022-12-06 DIAGNOSIS — I739 Peripheral vascular disease, unspecified: Secondary | ICD-10-CM

## 2022-12-06 DIAGNOSIS — I70213 Atherosclerosis of native arteries of extremities with intermittent claudication, bilateral legs: Secondary | ICD-10-CM

## 2022-12-06 NOTE — Telephone Encounter (Signed)
Spoke with pt to schedule surgery. All pre op instructions given. No questions/concerns at this time.

## 2022-12-08 ENCOUNTER — Encounter (HOSPITAL_COMMUNITY)
Admission: RE | Disposition: A | Discharge status: Home / Self Care | Payer: Self-pay | Source: Home / Self Care | Attending: Vascular Surgery

## 2022-12-08 ENCOUNTER — Ambulatory Visit (HOSPITAL_COMMUNITY)
Admission: RE | Admit: 2022-12-08 | Discharge: 2022-12-08 | Disposition: A | Discharge status: Home / Self Care | Payer: 59 | Attending: Vascular Surgery | Admitting: Vascular Surgery

## 2022-12-08 ENCOUNTER — Other Ambulatory Visit: Payer: Self-pay

## 2022-12-08 ENCOUNTER — Ambulatory Visit (HOSPITAL_BASED_OUTPATIENT_CLINIC_OR_DEPARTMENT_OTHER): Payer: 59

## 2022-12-08 DIAGNOSIS — I7090 Unspecified atherosclerosis: Secondary | ICD-10-CM | POA: Diagnosis not present

## 2022-12-08 DIAGNOSIS — I70213 Atherosclerosis of native arteries of extremities with intermittent claudication, bilateral legs: Secondary | ICD-10-CM

## 2022-12-08 DIAGNOSIS — I70221 Atherosclerosis of native arteries of extremities with rest pain, right leg: Secondary | ICD-10-CM | POA: Diagnosis not present

## 2022-12-08 DIAGNOSIS — Z87891 Personal history of nicotine dependence: Secondary | ICD-10-CM | POA: Diagnosis not present

## 2022-12-08 DIAGNOSIS — I739 Peripheral vascular disease, unspecified: Secondary | ICD-10-CM

## 2022-12-08 HISTORY — PX: ABDOMINAL AORTOGRAM W/LOWER EXTREMITY: CATH118223

## 2022-12-08 LAB — POCT I-STAT, CHEM 8
BUN: 25 mg/dL — ABNORMAL HIGH (ref 8–23)
Calcium, Ion: 1.28 mmol/L (ref 1.15–1.40)
Chloride: 103 mmol/L (ref 98–111)
Creatinine, Ser: 0.9 mg/dL (ref 0.61–1.24)
Glucose, Bld: 200 mg/dL — ABNORMAL HIGH (ref 70–99)
HCT: 57 % — ABNORMAL HIGH (ref 39.0–52.0)
Hemoglobin: 19.4 g/dL — ABNORMAL HIGH (ref 13.0–17.0)
Potassium: 4.9 mmol/L (ref 3.5–5.1)
Sodium: 139 mmol/L (ref 135–145)
TCO2: 25 mmol/L (ref 22–32)

## 2022-12-08 LAB — GLUCOSE, CAPILLARY: Glucose-Capillary: 177 mg/dL — ABNORMAL HIGH (ref 70–99)

## 2022-12-08 SURGERY — ABDOMINAL AORTOGRAM W/LOWER EXTREMITY
Anesthesia: LOCAL | Laterality: Right

## 2022-12-08 MED ORDER — HYDRALAZINE HCL 20 MG/ML IJ SOLN: 5.0000 mg | INTRAMUSCULAR | Status: DC | PRN

## 2022-12-08 MED ORDER — MIDAZOLAM HCL 2 MG/2ML IJ SOLN
INTRAMUSCULAR | Status: DC | PRN
  Administered 2022-12-08: 1 mg via INTRAVENOUS

## 2022-12-08 MED ORDER — SODIUM CHLORIDE 0.9% FLUSH: 3.0000 mL | INTRAVENOUS | Status: DC | PRN

## 2022-12-08 MED ORDER — FENTANYL CITRATE (PF) 100 MCG/2ML IJ SOLN
INTRAMUSCULAR | Status: DC | PRN
  Administered 2022-12-08 (×2): 50 ug via INTRAVENOUS

## 2022-12-08 MED ORDER — LIDOCAINE HCL (PF) 1 % IJ SOLN
INTRAMUSCULAR | Status: AC
  Filled 2022-12-08: qty 30

## 2022-12-08 MED ORDER — LIDOCAINE HCL (PF) 1 % IJ SOLN
INTRAMUSCULAR | Status: DC | PRN
  Administered 2022-12-08: 20 mL

## 2022-12-08 MED ORDER — SODIUM CHLORIDE 0.9 % IV SOLN: 250.0000 mL | INTRAVENOUS | Status: DC | PRN

## 2022-12-08 MED ORDER — HEPARIN (PORCINE) IN NACL 1000-0.9 UT/500ML-% IV SOLN
INTRAVENOUS | Status: DC | PRN
  Administered 2022-12-08 (×2): 500 mL

## 2022-12-08 MED ORDER — ASPIRIN 81 MG PO TBEC: 81.0000 mg | DELAYED_RELEASE_TABLET | Freq: Every day | ORAL | 2 refills | Status: AC

## 2022-12-08 MED ORDER — MIDAZOLAM HCL 2 MG/2ML IJ SOLN
INTRAMUSCULAR | Status: AC
  Filled 2022-12-08: qty 2

## 2022-12-08 MED ORDER — IODIXANOL 320 MG/ML IV SOLN
INTRAVENOUS | Status: DC | PRN
  Administered 2022-12-08: 100 mL

## 2022-12-08 MED ORDER — SODIUM CHLORIDE 0.9 % IV SOLN: INTRAVENOUS | Status: DC

## 2022-12-08 MED ORDER — SODIUM CHLORIDE 0.9% FLUSH: 3.0000 mL | Freq: Two times a day (BID) | INTRAVENOUS | Status: DC

## 2022-12-08 MED ORDER — ACETAMINOPHEN 325 MG PO TABS
650.0000 mg | ORAL_TABLET | ORAL | Status: DC | PRN
  Administered 2022-12-08: 650 mg via ORAL
  Filled 2022-12-08: qty 2

## 2022-12-08 MED ORDER — LABETALOL HCL 5 MG/ML IV SOLN: 10.0000 mg | INTRAVENOUS | Status: DC | PRN

## 2022-12-08 MED ORDER — SODIUM CHLORIDE 0.9 % WEIGHT BASED INFUSION: 1.0000 mL/kg/h | INTRAVENOUS | Status: AC

## 2022-12-08 MED ORDER — FENTANYL CITRATE (PF) 100 MCG/2ML IJ SOLN
INTRAMUSCULAR | Status: AC
  Filled 2022-12-08: qty 2

## 2022-12-08 MED ORDER — ONDANSETRON HCL 4 MG/2ML IJ SOLN: 4.0000 mg | Freq: Four times a day (QID) | INTRAMUSCULAR | Status: DC | PRN

## 2022-12-08 MED ORDER — ASPIRIN 81 MG PO TBEC
81.0000 mg | DELAYED_RELEASE_TABLET | Freq: Every day | ORAL | Status: DC
  Administered 2022-12-08: 81 mg via ORAL
  Filled 2022-12-08: qty 1

## 2022-12-08 SURGICAL SUPPLY — 13 items
CATH OMNI FLUSH 5F 65CM (CATHETERS) IMPLANT
DEVICE CLOSURE MYNXGRIP 5F (Vascular Products) IMPLANT
GLIDEWIRE ADV .035X260CM (WIRE) IMPLANT
KIT MICROPUNCTURE NIT STIFF (SHEATH) IMPLANT
KIT PV (KITS) ×1 IMPLANT
SHEATH PINNACLE 5F 10CM (SHEATH) IMPLANT
SHEATH PROBE COVER 6X72 (BAG) IMPLANT
STOPCOCK MORSE 400PSI 3WAY (MISCELLANEOUS) IMPLANT
SYR MEDRAD MARK 7 150ML (SYRINGE) ×1 IMPLANT
TRANSDUCER W/STOPCOCK (MISCELLANEOUS) ×1 IMPLANT
TRAY PV CATH (CUSTOM PROCEDURE TRAY) ×1 IMPLANT
TUBING CIL FLEX 10 FLL-RA (TUBING) IMPLANT
WIRE HITORQ VERSACORE ST 145CM (WIRE) IMPLANT

## 2022-12-08 NOTE — Progress Notes (Signed)
Patient seen and examined in preop holding.  No complaints. No changes to medication history or physical exam since last seen in clinic. Claudication has progressed to rest pain at night in the right leg After discussing the risks and benefits of right lower extremity angiogram in an effort to define and possibly improve distal perfusion for wound healing, Jim [name] elected to proceed.   He realizes this will require either recanalization of previous stents versus open bypass surgery.  [name] MD   Vascular and Vein Specialist of   Patient name: Jim [name] MRN: [id number] DOB: [birth year] Sex: male  REASON FOR CONSULT: Evaluation intermittent claudication  HPI: Jim [name] is a 67 y.o. male, who is here today for evaluation of right greater than left leg intermittent claudication.  He was seen in our office in 2019 with similar symptoms.  He underwent arteriography at [facility] on 05/19/2018.  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  He had successful crossing of this occlusion and angioplasty and stenting of the lesion.  He did not return for follow-up.  He presents now with progressive claudication symptoms.  He reports that the right leg is worse than the left.  He reports that this makes it difficult for him to do his daily activities even around the house.  He reports that he enjoys doing yard work and gardening and is unable to do this due to his claudication symptoms.  This is calf claudication only.  He does not have any tissue loss.  Past Medical History:  Diagnosis Date   Arthritis    Back pain    Bursitis of hip    Bilateral   Depression    DJD (degenerative joint disease) of knee    Bilateral Left > Right   Esophageal ulcer    Ex-smoker    Quit 11/07/2011   Gastric ulcer    History of colon  polyps    Hyperlipidemia    Primary localized osteoarthritis of right knee    PVD (peripheral vascular disease) with claudication (HCC) 05/21/2018   Under the care of vascular surgery    Family History  Problem Relation Age of Onset   Heart disease Mother    Heart disease Father    Diabetes type I Father    Hypertension Father    Cancer Father    Heart disease Sister    Heart disease Brother     SOCIAL HISTORY: Social History   Socioeconomic History   Marital status: Married    Spouse name: Not on file   Number of children: Not on file   Years of education: Not on file   Highest education level: 12th grade  Occupational History   Occupation: disabled    Employer: OTHER  Tobacco Use   Smoking status: Former    Packs/day: 2.00    Years: 40.00    Additional pack years: 0.00    Total pack years: 80.00    Types: Cigarettes    Quit date: 10/31/2011    Years since quitting: 11.1    Passive exposure: Current   Smokeless tobacco: Never  Vaping Use   Vaping Use: Never used  Substance and  Sexual Activity   Alcohol use: Not Currently   Drug use: No   Sexual activity: Yes  Other Topics Concern   Not on file  Social History Narrative   Not on file   Social Determinants of Health   Financial Resource Strain: Patient Declined (11/01/2022)   Overall Financial Resource Strain (CARDIA)    Difficulty of Paying Living Expenses: Patient declined  Food Insecurity: Food Insecurity Present (11/01/2022)   Hunger Vital Sign    Worried About Running Out of Food in the Last Year: Sometimes true    Ran Out of Food in the Last Year: Never true  Transportation Needs: No Transportation Needs (11/01/2022)   PRAPARE - Administrator, Civil Service (Medical): No    Lack of Transportation (Non-Medical): No  Physical Activity: Inactive (11/01/2022)   Exercise Vital Sign    Days of Exercise per Week: 0 days    Minutes of Exercise per Session: 20 min  Stress: No Stress Concern Present  (11/01/2022)   [employer] of Occupational Health - Occupational Stress Questionnaire    Feeling of Stress : Only a little  Social Connections: Moderately Isolated (11/01/2022)   Social Connection and Isolation Panel [NHANES]    Frequency of Communication with Friends and Family: More than three times a week    Frequency of Social Gatherings with Friends and Family: More than three times a week    Attends Religious Services: Never    Database administrator or Organizations: No    Attends Banker Meetings: Never    Marital Status: Married  Catering manager Violence: Not At Risk (05/31/2022)   Humiliation, Afraid, Rape, and Kick questionnaire    Fear of Current or Ex-Partner: No    Emotionally Abused: No    Physically Abused: No    Sexually Abused: No    Allergies  Allergen Reactions   Doxycycline Nausea Only    PATIENT STILL TOLERATES   Pravastatin Other (See Comments)    myalgia    Current Facility-Administered Medications  Medication Dose Route Frequency Provider Last Rate Last Admin   0.9 %  sodium chloride infusion   Intravenous Continuous [name], MD 100 mL/hr at 12/08/22 1020 New Bag at 12/08/22 1020    REVIEW OF SYSTEMS:  [X]  denotes positive finding, [ ]  denotes negative finding Cardiac  Comments:  Chest pain or chest pressure:    Shortness of breath upon exertion:    Short of breath when lying flat:    Irregular heart rhythm:        Vascular    Pain in calf, thigh, or hip brought on by ambulation: x   Pain in feet at night that wakes you up from your sleep:  x   Blood clot in your veins:    Leg swelling:  x       Pulmonary    Oxygen at home:    Productive cough:     Wheezing:  x       Neurologic    Sudden weakness in arms or legs:  x   Sudden numbness in arms or legs:  x   Sudden onset of difficulty speaking or slurred speech:    Temporary loss of vision in one eye:     Problems with dizziness:         Gastrointestinal     Blood in stool:     Vomited blood:         Genitourinary  Burning when urinating:     Blood in urine:        Psychiatric    Major depression:  x       Hematologic    Bleeding problems:    Problems with blood clotting too easily:        Skin    Rashes or ulcers:        Constitutional    Fever or chills:      PHYSICAL EXAM: Vitals:   12/08/22 1027  BP: 128/83  Pulse: 70  Temp: 98.1 F (36.7 C)  SpO2: 95%  Weight: 95.3 kg  Height: 5\' 9"  (1.753 m)    GENERAL: The patient is a well-nourished male, in no acute distress. The vital signs are documented above. CARDIOVASCULAR: 2+ radial pulses.  2+ femoral pulses bilaterally.  Absent popliteal and distal pulses bilaterally PULMONARY: There is good air exchange  MUSCULOSKELETAL: There are no major deformities or cyanosis. NEUROLOGIC: No focal weakness or paresthesias are detected. SKIN: There are no ulcers or rashes noted. PSYCHIATRIC: The patient has a normal affect.  DATA:  Noninvasive studies reveal ankle arm index of 0.57 on the right with monophasic flow and 0.76 on the left with monophasic flow  MEDICAL ISSUES: Discussed these findings at length with the patient.  Explained that he appears to have had recurrent disease in his right leg and progression on the left.  I explained that this is not limb threatening.  I explained that it would be safe to continue his walking program.  He reports that he is severely limited and is not comfortable with this level of claudication.  I did discuss the option of arteriography and possible reintervention.  Also explained that in all likelihood this may require femoral-popliteal bypass on the right for symptom resolution.  He wishes to proceed with outpatient arteriography and possible endovascular treatment for his severe superficial femoral artery occlusive disease.  We will coordinate this as an outpatient at his convenience   Larina Earthly, MD [facility] Vascular and Vein Specialists  of [facility] Tel [phone] Pager [phone]  Note: Portions of this report may have been transcribed using voice recognition software.  Every effort has been made to ensure accuracy; however, inadvertent computerized transcription errors may still be present.

## 2022-12-08 NOTE — Op Note (Signed)
    Patient name: Jim [name] MRN: [id number] DOB: 1956-06-21 Sex: male  12/08/2022 Pre-operative Diagnosis: Right lower extremity critical limb ischemia with rest pain Post-operative diagnosis:  Same Surgeon:  [name], MD Procedure Performed: 1.  Ultrasound-guided micropuncture access of the left common femoral artery in retrograde fashion 2.  Aortogram 3.  Second-order cannulation, right lower extremity angiogram 4.  Left common femoral artery angiogram 5.  Device assisted closure-Mynx 6.  Moderate sedation time 36 minutes, contrast volume 100 cc 7.   8.     Indications: Patient is a 67 year old male with known peripheral arterial disease who smokes 1 pack/day who has had progressive lower extremity claudication over the last year.  The claudication has gone from moderate, too severe to lifestyle-limiting to now rest pain, which is affecting his sleep at night.  He can no longer work in his garden due to the pain, and sits around his house for the majority of the day.  After discussing risk and benefits of right lower extremity angiogram in an effort to define and improve distal perfusion for wound healing, Jim [name] elected to proceed  Findings:  Aortogram: Patent celiac artery, superior mesenteric artery, bilateral renal arteries, infrarenal abdominal aorta with some ectasia. On the right: Widely patent common iliac artery, hypogastric artery, external iliac artery, common femoral artery, profunda Flush occlusion of the superficial femoral artery with reconstitution at the P2 segment of the popliteal artery 3 cm prior to tibial branching.  There is two-vessel runoff via the posterior tibial and peroneal arteries.  The posterior tibial provides dominant runoff to the foot. On the left: Widely patent common iliac artery, hypogastric artery, external neck artery, common femoral artery, proximal superficial femoral artery, proximal profunda   Procedure:  The patient was identified in  the holding area and taken to room 8.  The patient was then placed supine on the table and prepped and draped in the usual sterile fashion.  A time out was called.  Ultrasound was used to evaluate the left common femoral artery.  It was patent .  A digital ultrasound image was acquired.  A micropuncture needle was used to access the left common femoral artery under ultrasound guidance.  An 018 wire was advanced without resistance and a micropuncture sheath was placed.  The 018 wire was removed and a benson wire was placed.  The micropuncture sheath was exchanged for a 5 french sheath.  An omniflush catheter was advanced over the wire to the level of L-1.  An abdominal angiogram was obtained.  Next, using the omniflush catheter and a benson wire, the aortic bifurcation was crossed and the catheter was placed into theright external iliac artery and right runoff was obtained.  Left-sided angiogram of the common iliac artery, external neck artery, common femoral artery, proximal profunda, proximal superficial femoral artery followed.  See results above.  Patient closed using a minx device.  Impression: Flush occlusion of the superficial femoral artery with reconstitution at the P2 segment of the popliteal artery.   Will require bypass surgery-likely femoral to proximal posterior tibial artery as there is only a short segment of the below-knee popliteal artery patent and it is more proximal than usual - directly behind the knee joint.  Patient undergoing vein mapping.    Jim Olden, MD [facility] Specialists of [place] Office: [phone]

## 2022-12-08 NOTE — H&P (Signed)
                                Patient seen and examined in preop holding.  No complaints. No changes to medication history or physical exam since last seen in clinic. Claudication has progressed to rest pain at night in the right leg After discussing the risks and benefits of right lower extremity angiogram in an effort to define and possibly improve distal perfusion for wound healing, Jim [name] elected to proceed.   He realizes this will require either recanalization of previous stents versus open bypass surgery.  Jim [name] E Jim Bramblett MD   [facility] [place]  Patient name: Jim [name] MRN: [id number] DOB: 04/15/1956 Sex: male  REASON FOR CONSULT: Evaluation intermittent claudication  HPI: Jim [name] is a 67 y.o. male, who is here today for evaluation of right greater than left leg intermittent claudication.  He was seen in our office in 2019 with similar symptoms.  He underwent arteriography at [facility] on 05/19/2018.  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  He had successful crossing of this occlusion and angioplasty and stenting of the lesion.  He did not return for follow-up.  He presents now with progressive claudication symptoms.  He reports that the right leg is worse than the left.  He reports that this makes it difficult for him to do his daily activities even around the house.  He reports that he enjoys doing yard work and gardening and is unable to do this due to his claudication symptoms.  This is calf claudication only.  He does not have any tissue loss.  Past Medical History:  Diagnosis Date   Arthritis    Back pain    Bursitis of hip    Bilateral   Depression    DJD (degenerative joint disease) of knee    Bilateral Left > Right   Esophageal ulcer    Ex-smoker    Quit 11/07/2011   Gastric ulcer    History of colon  polyps    Hyperlipidemia    Primary localized osteoarthritis of right knee    PVD (peripheral vascular disease) with claudication (HCC) 05/21/2018   Under the care of vascular surgery    Family History  Problem Relation Age of Onset   Heart disease Mother    Heart disease Father    Diabetes type I Father    Hypertension Father    Cancer Father    Heart disease Sister    Heart disease Brother     SOCIAL HISTORY: Social History   Socioeconomic History   Marital status: Married    Spouse name: Not on file   Number of children: Not on file   Years of education: Not on file   Highest education level: 12th grade  Occupational History   Occupation: disabled    Employer: OTHER  Tobacco Use   Smoking status: Former    Packs/day: 2.00    Years: 40.00    Additional pack years: 0.00    Total pack years: 80.00    Types: Cigarettes    Quit date: 10/31/2011    Years since quitting: 11.1    Passive exposure: Current   Smokeless tobacco: Never  Vaping Use   Vaping Use: Never used  Substance and   Sexual Activity   Alcohol use: Not Currently   Drug use: No   Sexual activity: Yes  Other Topics Concern   Not on file  Social History Narrative   Not on file   Social Determinants of Health   Financial Resource Strain: Patient Declined (11/01/2022)   Overall Financial Resource Strain (CARDIA)    Difficulty of Paying Living Expenses: Patient declined  Food Insecurity: Food Insecurity Present (11/01/2022)   Hunger Vital Sign    Worried About Running Out of Food in the Last Year: Sometimes true    Ran Out of Food in the Last Year: Never true  Transportation Needs: No Transportation Needs (11/01/2022)   PRAPARE - Transportation    Lack of Transportation (Medical): No    Lack of Transportation (Non-Medical): No  Physical Activity: Inactive (11/01/2022)   Exercise Vital Sign    Days of Exercise per Week: 0 days    Minutes of Exercise per Session: 20 min  Stress: No Stress Concern Present  (11/01/2022)   [employer] - Occupational Stress Questionnaire    Feeling of Stress : Only a little  Social Connections: Moderately Isolated (11/01/2022)   Social Connection and Isolation Panel [NHANES]    Frequency of Communication with Friends and Family: More than three times a week    Frequency of Social Gatherings with Friends and Family: More than three times a week    Attends Religious Services: Never    Active Member of Clubs or Organizations: No    Attends Club or Organization Meetings: Never    Marital Status: Married  Intimate Partner Violence: Not At Risk (05/31/2022)   Humiliation, Afraid, Rape, and Kick questionnaire    Fear of Current or Ex-Partner: No    Emotionally Abused: No    Physically Abused: No    Sexually Abused: No    Allergies  Allergen Reactions   Doxycycline Nausea Only    PATIENT STILL TOLERATES   Pravastatin Other (See Comments)    myalgia    Current Facility-Administered Medications  Medication Dose Route Frequency Provider Last Rate Last Admin   0.9 %  sodium chloride infusion   Intravenous Continuous Kavion Mancinas E, MD 100 mL/hr at 12/08/22 1020 New Bag at 12/08/22 1020    REVIEW OF SYSTEMS:  [X] denotes positive finding, [ ] denotes negative finding Cardiac  Comments:  Chest pain or chest pressure:    Shortness of breath upon exertion:    Short of breath when lying flat:    Irregular heart rhythm:        Vascular    Pain in calf, thigh, or hip brought on by ambulation: x   Pain in feet at night that wakes you up from your sleep:  x   Blood clot in your veins:    Leg swelling:  x       Pulmonary    Oxygen at home:    Productive cough:     Wheezing:  x       Neurologic    Sudden weakness in arms or legs:  x   Sudden numbness in arms or legs:  x   Sudden onset of difficulty speaking or slurred speech:    Temporary loss of vision in one eye:     Problems with dizziness:         Gastrointestinal     Blood in stool:     Vomited blood:         Genitourinary      Burning when urinating:     Blood in urine:        Psychiatric    Major depression:  x       Hematologic    Bleeding problems:    Problems with blood clotting too easily:        Skin    Rashes or ulcers:        Constitutional    Fever or chills:      PHYSICAL EXAM: Vitals:   12/08/22 1027  BP: 128/83  Pulse: 70  Temp: 98.1 F (36.7 C)  SpO2: 95%  Weight: 95.3 kg  Height: 5' 9" (1.753 m)    GENERAL: The patient is a well-nourished male, in no acute distress. The vital signs are documented above. CARDIOVASCULAR: 2+ radial pulses.  2+ femoral pulses bilaterally.  Absent popliteal and distal pulses bilaterally PULMONARY: There is good air exchange  MUSCULOSKELETAL: There are no major deformities or cyanosis. NEUROLOGIC: No focal weakness or paresthesias are detected. SKIN: There are no ulcers or rashes noted. PSYCHIATRIC: The patient has a normal affect.  DATA:  Noninvasive studies reveal ankle arm index of 0.57 on the right with monophasic flow and 0.76 on the left with monophasic flow  MEDICAL ISSUES: Discussed these findings at length with the patient.  Explained that he appears to have had recurrent disease in his right leg and progression on the left.  I explained that this is not limb threatening.  I explained that it would be safe to continue his walking program.  He reports that he is severely limited and is not comfortable with this level of claudication.  I did discuss the option of arteriography and possible reintervention.  Also explained that in all likelihood this may require femoral-popliteal bypass on the right for symptom resolution.  He wishes to proceed with outpatient arteriography and possible endovascular treatment for his severe superficial femoral artery occlusive disease.  We will coordinate this as an outpatient at his convenience   Todd F. Early, MD FACS [facility] [place] Office Tel [phone] Pager [phone]  Note: Portions of this report may have been transcribed using voice recognition software.  Every effort has been made to ensure accuracy; however, inadvertent computerized transcription errors may still be present.  

## 2022-12-08 NOTE — Progress Notes (Signed)
Bilateral lower extremity vein mapping study completed.   Please see CV Procedures for preliminary results.  Karsten Howry, RVT  2:22 PM 12/08/22

## 2022-12-09 ENCOUNTER — Other Ambulatory Visit: Payer: Self-pay

## 2022-12-09 ENCOUNTER — Encounter (HOSPITAL_COMMUNITY): Payer: Self-pay | Admitting: Vascular Surgery

## 2022-12-09 ENCOUNTER — Ambulatory Visit: Payer: 59 | Admitting: Family Medicine

## 2022-12-09 DIAGNOSIS — I70221 Atherosclerosis of native arteries of extremities with rest pain, right leg: Secondary | ICD-10-CM

## 2022-12-10 ENCOUNTER — Other Ambulatory Visit: Payer: Self-pay

## 2022-12-10 ENCOUNTER — Encounter (HOSPITAL_COMMUNITY): Payer: Self-pay | Admitting: Vascular Surgery

## 2022-12-10 NOTE — Progress Notes (Signed)
SDW call  Patient was given pre-op instructions over the phone. Patient verbalized understanding of instructions provided.    PCP - Dr. Lilyan Punt Cardiologist - Denies Pulmonary: Denies   PPM/ICD - Denies  Chest x-ray - 07/27/2022 EKG -  07/07/2022 Stress Test - ECHO -  Cardiac Cath -   Sleep Study/sleep apnea/CPAP: Diagnosed with sleep apnea, does not wear his CPAP  Type II diabetic Fasting Blood sugar range: 130-140 How often check sugars: Daily Glipizide, hold the day of surgery Metformin, hold the day of surgery Jardiance, hold 72 hours prior to surgery.  Last dose was 12/09/2022   Blood Thinner Instructions: Plavix, last dose was 12/09/2022 Aspirin Instructions: Continue   ERAS Protcol - No, NPO PRE-SURGERY Ensure or G2-    COVID TEST- na    Anesthesia review: Yes. PVD, DM, HTN, Asthma, sleep apnea, high cholesterol   Patient denies shortness of breath, fever, cough and chest pain over the phone call  Your procedure is scheduled on Monday Dec 13, 2022  Report to [facility] Main Entrance "A" at  0930  A.M., then check in with the Admitting office.  Call this number if you have problems the morning of surgery:  [phone]   If you have any questions prior to your surgery date call [phone]: Open Monday-Friday 8am-4pm If you experience any cold or flu symptoms such as cough, fever, chills, shortness of breath, etc. between now and your scheduled surgery, please notify us at the above number   Remember:  Do not eat or drink after midnight the night before your surgery Take these medicines the morning of surgery with A SIP OF WATER:  Aspirin, wellbutrin, carvedilol, crestor  As needed: Tylenol, albuterol, oxycodone, protonix  As of today, STOP taking any Aleve, Naproxen, Ibuprofen, Motrin, Advil, Goody's, BC's, all herbal medications, fish oil, and all vitamins.

## 2022-12-13 ENCOUNTER — Inpatient Hospital Stay (HOSPITAL_COMMUNITY)
Admission: RE | Admit: 2022-12-13 | Discharge: 2022-12-18 | DRG: 253 | Disposition: A | Discharge status: Home / Self Care | Payer: 59 | Attending: Vascular Surgery | Admitting: Vascular Surgery

## 2022-12-13 ENCOUNTER — Other Ambulatory Visit: Payer: Self-pay

## 2022-12-13 ENCOUNTER — Inpatient Hospital Stay (HOSPITAL_COMMUNITY): Payer: 59 | Admitting: Physician Assistant

## 2022-12-13 ENCOUNTER — Encounter (HOSPITAL_COMMUNITY): Payer: Self-pay | Admitting: Vascular Surgery

## 2022-12-13 ENCOUNTER — Encounter (HOSPITAL_COMMUNITY)
Admission: RE | Disposition: A | Discharge status: Home / Self Care | Payer: Self-pay | Source: Home / Self Care | Attending: Vascular Surgery

## 2022-12-13 DIAGNOSIS — F17210 Nicotine dependence, cigarettes, uncomplicated: Secondary | ICD-10-CM

## 2022-12-13 DIAGNOSIS — J45909 Unspecified asthma, uncomplicated: Secondary | ICD-10-CM | POA: Diagnosis present

## 2022-12-13 DIAGNOSIS — Z79899 Other long term (current) drug therapy: Secondary | ICD-10-CM | POA: Diagnosis not present

## 2022-12-13 DIAGNOSIS — D696 Thrombocytopenia, unspecified: Secondary | ICD-10-CM | POA: Diagnosis not present

## 2022-12-13 DIAGNOSIS — Z881 Allergy status to other antibiotic agents status: Secondary | ICD-10-CM | POA: Diagnosis not present

## 2022-12-13 DIAGNOSIS — Z794 Long term (current) use of insulin: Secondary | ICD-10-CM

## 2022-12-13 DIAGNOSIS — Z8711 Personal history of peptic ulcer disease: Secondary | ICD-10-CM | POA: Diagnosis not present

## 2022-12-13 DIAGNOSIS — G4730 Sleep apnea, unspecified: Secondary | ICD-10-CM | POA: Diagnosis present

## 2022-12-13 DIAGNOSIS — I70221 Atherosclerosis of native arteries of extremities with rest pain, right leg: Secondary | ICD-10-CM | POA: Diagnosis not present

## 2022-12-13 DIAGNOSIS — F32A Depression, unspecified: Secondary | ICD-10-CM | POA: Diagnosis present

## 2022-12-13 DIAGNOSIS — E1151 Type 2 diabetes mellitus with diabetic peripheral angiopathy without gangrene: Principal | ICD-10-CM | POA: Diagnosis present

## 2022-12-13 DIAGNOSIS — E1165 Type 2 diabetes mellitus with hyperglycemia: Secondary | ICD-10-CM | POA: Diagnosis not present

## 2022-12-13 DIAGNOSIS — Z833 Family history of diabetes mellitus: Secondary | ICD-10-CM

## 2022-12-13 DIAGNOSIS — E7800 Pure hypercholesterolemia, unspecified: Secondary | ICD-10-CM | POA: Diagnosis not present

## 2022-12-13 DIAGNOSIS — F17200 Nicotine dependence, unspecified, uncomplicated: Secondary | ICD-10-CM | POA: Diagnosis not present

## 2022-12-13 DIAGNOSIS — I10 Essential (primary) hypertension: Secondary | ICD-10-CM | POA: Diagnosis present

## 2022-12-13 DIAGNOSIS — Z8719 Personal history of other diseases of the digestive system: Secondary | ICD-10-CM | POA: Diagnosis not present

## 2022-12-13 DIAGNOSIS — K219 Gastro-esophageal reflux disease without esophagitis: Secondary | ICD-10-CM | POA: Diagnosis present

## 2022-12-13 DIAGNOSIS — I70202 Unspecified atherosclerosis of native arteries of extremities, left leg: Secondary | ICD-10-CM | POA: Diagnosis not present

## 2022-12-13 DIAGNOSIS — I70211 Atherosclerosis of native arteries of extremities with intermittent claudication, right leg: Secondary | ICD-10-CM | POA: Diagnosis not present

## 2022-12-13 DIAGNOSIS — Z888 Allergy status to other drugs, medicaments and biological substances status: Secondary | ICD-10-CM

## 2022-12-13 DIAGNOSIS — Z95820 Peripheral vascular angioplasty status with implants and grafts: Secondary | ICD-10-CM | POA: Diagnosis not present

## 2022-12-13 DIAGNOSIS — D62 Acute posthemorrhagic anemia: Secondary | ICD-10-CM | POA: Diagnosis not present

## 2022-12-13 DIAGNOSIS — Z8249 Family history of ischemic heart disease and other diseases of the circulatory system: Secondary | ICD-10-CM

## 2022-12-13 DIAGNOSIS — E785 Hyperlipidemia, unspecified: Principal | ICD-10-CM

## 2022-12-13 DIAGNOSIS — Z7984 Long term (current) use of oral hypoglycemic drugs: Secondary | ICD-10-CM

## 2022-12-13 HISTORY — DX: Unspecified asthma, uncomplicated: J45.909

## 2022-12-13 HISTORY — DX: Sleep apnea, unspecified: G47.30

## 2022-12-13 HISTORY — DX: Type 2 diabetes mellitus without complications: E11.9

## 2022-12-13 HISTORY — DX: Essential (primary) hypertension: I10

## 2022-12-13 HISTORY — DX: Gastro-esophageal reflux disease without esophagitis: K21.9

## 2022-12-13 HISTORY — DX: Pure hypercholesterolemia, unspecified: E78.00

## 2022-12-13 HISTORY — PX: FEMORAL-TIBIAL BYPASS GRAFT: SHX938

## 2022-12-13 LAB — TYPE AND SCREEN
ABO/RH(D): O POS
Antibody Screen: NEGATIVE

## 2022-12-13 LAB — CBC
HCT: 50.5 % (ref 39.0–52.0)
Hemoglobin: 16.4 g/dL (ref 13.0–17.0)
MCH: 31.1 pg (ref 26.0–34.0)
MCHC: 32.5 g/dL (ref 30.0–36.0)
MCV: 95.8 fL (ref 80.0–100.0)
Platelets: 113 10*3/uL — ABNORMAL LOW (ref 150–400)
RBC: 5.27 MIL/uL (ref 4.22–5.81)
RDW: 13.4 % (ref 11.5–15.5)
WBC: 7.2 10*3/uL (ref 4.0–10.5)
nRBC: 0 % (ref 0.0–0.2)

## 2022-12-13 LAB — URINALYSIS, ROUTINE W REFLEX MICROSCOPIC
Bilirubin Urine: NEGATIVE
Glucose, UA: 150 mg/dL — AB
Hgb urine dipstick: NEGATIVE
Ketones, ur: NEGATIVE mg/dL
Leukocytes,Ua: NEGATIVE
Nitrite: NEGATIVE
Protein, ur: NEGATIVE mg/dL
Specific Gravity, Urine: 1.029 (ref 1.005–1.030)
pH: 5 (ref 5.0–8.0)

## 2022-12-13 LAB — PROTIME-INR
INR: 1 (ref 0.8–1.2)
Prothrombin Time: 13.2 seconds (ref 11.4–15.2)

## 2022-12-13 LAB — COMPREHENSIVE METABOLIC PANEL
ALT: 40 U/L (ref 0–44)
AST: 22 U/L (ref 15–41)
Albumin: 3.9 g/dL (ref 3.5–5.0)
Alkaline Phosphatase: 62 U/L (ref 38–126)
Anion gap: 8 (ref 5–15)
BUN: 18 mg/dL (ref 8–23)
CO2: 22 mmol/L (ref 22–32)
Calcium: 8.9 mg/dL (ref 8.9–10.3)
Chloride: 106 mmol/L (ref 98–111)
Creatinine, Ser: 0.95 mg/dL (ref 0.61–1.24)
GFR, Estimated: >60 mL/min (ref >60)
Glucose, Bld: 215 mg/dL — ABNORMAL HIGH (ref 70–99)
Potassium: 4.5 mmol/L (ref 3.5–5.1)
Sodium: 136 mmol/L (ref 135–145)
Total Bilirubin: 0.8 mg/dL (ref 0.3–1.2)
Total Protein: 6.5 g/dL (ref 6.5–8.1)

## 2022-12-13 LAB — GLUCOSE, CAPILLARY
Glucose-Capillary: 215 mg/dL — ABNORMAL HIGH (ref 70–99)
Glucose-Capillary: 199 mg/dL — ABNORMAL HIGH (ref 70–99)
Glucose-Capillary: 216 mg/dL — ABNORMAL HIGH (ref 70–99)
Glucose-Capillary: 244 mg/dL — ABNORMAL HIGH (ref 70–99)
Glucose-Capillary: 262 mg/dL — ABNORMAL HIGH (ref 70–99)
Glucose-Capillary: 276 mg/dL — ABNORMAL HIGH (ref 70–99)

## 2022-12-13 LAB — APTT: aPTT: 24 seconds (ref 24–36)

## 2022-12-13 LAB — POCT ACTIVATED CLOTTING TIME
Activated Clotting Time: 239 seconds
Activated Clotting Time: 298 seconds

## 2022-12-13 SURGERY — CREATION, BYPASS, ARTERIAL, FEMORAL TO TIBIAL, USING GRAFT
Anesthesia: General | Site: Leg Upper | Laterality: Right

## 2022-12-13 MED ORDER — ACETAMINOPHEN 10 MG/ML IV SOLN: 1000.0000 mg | Freq: Once | INTRAVENOUS | Status: DC | PRN

## 2022-12-13 MED ORDER — PROPOFOL 10 MG/ML IV BOLUS
INTRAVENOUS | Status: AC
  Filled 2022-12-13: qty 20

## 2022-12-13 MED ORDER — 0.9 % SODIUM CHLORIDE (POUR BTL) OPTIME
TOPICAL | Status: DC | PRN
  Administered 2022-12-13 (×2): 1000 mL

## 2022-12-13 MED ORDER — PROPOFOL 10 MG/ML IV BOLUS
INTRAVENOUS | Status: DC | PRN
  Administered 2022-12-13: 40 mg via INTRAVENOUS
  Administered 2022-12-13: 160 mg via INTRAVENOUS

## 2022-12-13 MED ORDER — FLUOXETINE HCL 20 MG PO CAPS
60.0000 mg | ORAL_CAPSULE | Freq: Every day | ORAL | Status: DC
  Administered 2022-12-14 – 2022-12-18 (×5): 60 mg via ORAL
  Filled 2022-12-13 (×5): qty 3

## 2022-12-13 MED ORDER — METOPROLOL TARTRATE 5 MG/5ML IV SOLN: 2.0000 mg | INTRAVENOUS | Status: DC | PRN

## 2022-12-13 MED ORDER — HEPARIN 6000 UNIT IRRIGATION SOLUTION
Status: DC | PRN
  Administered 2022-12-13: 1

## 2022-12-13 MED ORDER — FENTANYL CITRATE (PF) 250 MCG/5ML IJ SOLN
INTRAMUSCULAR | Status: AC
  Filled 2022-12-13: qty 5

## 2022-12-13 MED ORDER — GLIPIZIDE ER 2.5 MG PO TB24
2.5000 mg | ORAL_TABLET | Freq: Every morning | ORAL | Status: DC
  Administered 2022-12-14 – 2022-12-18 (×5): 2.5 mg via ORAL
  Filled 2022-12-13 (×5): qty 1

## 2022-12-13 MED ORDER — SUGAMMADEX SODIUM 200 MG/2ML IV SOLN
INTRAVENOUS | Status: DC | PRN
  Administered 2022-12-13: 200 mg via INTRAVENOUS

## 2022-12-13 MED ORDER — DEXAMETHASONE SODIUM PHOSPHATE 10 MG/ML IJ SOLN
INTRAMUSCULAR | Status: DC | PRN
  Administered 2022-12-13: 5 mg via INTRAVENOUS

## 2022-12-13 MED ORDER — HEPARIN 6000 UNIT IRRIGATION SOLUTION
Status: AC
  Filled 2022-12-13: qty 500

## 2022-12-13 MED ORDER — SODIUM CHLORIDE 0.9 % IV SOLN: INTRAVENOUS | Status: DC

## 2022-12-13 MED ORDER — ACETAMINOPHEN 650 MG RE SUPP: 325.0000 mg | RECTAL | Status: DC | PRN

## 2022-12-13 MED ORDER — LABETALOL HCL 5 MG/ML IV SOLN
INTRAVENOUS | Status: AC
  Filled 2022-12-13: qty 4

## 2022-12-13 MED ORDER — DEXAMETHASONE SODIUM PHOSPHATE 10 MG/ML IJ SOLN
INTRAMUSCULAR | Status: AC
  Filled 2022-12-13: qty 1

## 2022-12-13 MED ORDER — FENTANYL CITRATE (PF) 250 MCG/5ML IJ SOLN
INTRAMUSCULAR | Status: DC | PRN
  Administered 2022-12-13: 50 ug via INTRAVENOUS
  Administered 2022-12-13: 100 ug via INTRAVENOUS
  Administered 2022-12-13 (×2): 50 ug via INTRAVENOUS

## 2022-12-13 MED ORDER — ALUM & MAG HYDROXIDE-SIMETH 200-200-20 MG/5ML PO SUSP: 15.0000 mL | ORAL | Status: DC | PRN

## 2022-12-13 MED ORDER — ROCURONIUM BROMIDE 10 MG/ML (PF) SYRINGE
PREFILLED_SYRINGE | INTRAVENOUS | Status: AC
  Filled 2022-12-13: qty 20

## 2022-12-13 MED ORDER — SENNOSIDES-DOCUSATE SODIUM 8.6-50 MG PO TABS: 1.0000 | ORAL_TABLET | Freq: Every evening | ORAL | Status: DC | PRN

## 2022-12-13 MED ORDER — POTASSIUM CHLORIDE CRYS ER 20 MEQ PO TBCR: 20.0000 meq | EXTENDED_RELEASE_TABLET | Freq: Every day | ORAL | Status: DC | PRN

## 2022-12-13 MED ORDER — DOCUSATE SODIUM 100 MG PO CAPS
100.0000 mg | ORAL_CAPSULE | Freq: Every day | ORAL | Status: DC
  Administered 2022-12-14 – 2022-12-18 (×5): 100 mg via ORAL
  Filled 2022-12-13 (×5): qty 1

## 2022-12-13 MED ORDER — PANTOPRAZOLE SODIUM 40 MG PO TBEC
40.0000 mg | DELAYED_RELEASE_TABLET | Freq: Two times a day (BID) | ORAL | Status: DC
  Administered 2022-12-13 – 2022-12-18 (×10): 40 mg via ORAL
  Filled 2022-12-13 (×10): qty 1

## 2022-12-13 MED ORDER — LIDOCAINE 2% (20 MG/ML) 5 ML SYRINGE
INTRAMUSCULAR | Status: AC
  Filled 2022-12-13: qty 5

## 2022-12-13 MED ORDER — PHENOL 1.4 % MT LIQD: 1.0000 | OROMUCOSAL | Status: DC | PRN

## 2022-12-13 MED ORDER — EMPAGLIFLOZIN 10 MG PO TABS
10.0000 mg | ORAL_TABLET | Freq: Every day | ORAL | Status: DC
  Administered 2022-12-14 – 2022-12-18 (×5): 10 mg via ORAL
  Filled 2022-12-13 (×5): qty 1

## 2022-12-13 MED ORDER — HEPARIN SODIUM (PORCINE) 1000 UNIT/ML IJ SOLN
INTRAMUSCULAR | Status: AC
  Filled 2022-12-13: qty 20

## 2022-12-13 MED ORDER — CEFAZOLIN SODIUM-DEXTROSE 2-4 GM/100ML-% IV SOLN
2.0000 g | INTRAVENOUS | Status: AC
  Administered 2022-12-13: 2 g via INTRAVENOUS
  Filled 2022-12-13: qty 100

## 2022-12-13 MED ORDER — LACTATED RINGERS IV SOLN: INTRAVENOUS | Status: DC | PRN

## 2022-12-13 MED ORDER — PROTAMINE SULFATE 10 MG/ML IV SOLN
INTRAVENOUS | Status: DC | PRN
  Administered 2022-12-13: 40 mg via INTRAVENOUS
  Administered 2022-12-13: 10 mg via INTRAVENOUS

## 2022-12-13 MED ORDER — SURGIFLO WITH THROMBIN (HEMOSTATIC MATRIX KIT) OPTIME
TOPICAL | Status: DC | PRN
  Administered 2022-12-13: 1 via TOPICAL

## 2022-12-13 MED ORDER — HYDRALAZINE HCL 20 MG/ML IJ SOLN
INTRAMUSCULAR | Status: AC
  Filled 2022-12-13: qty 1

## 2022-12-13 MED ORDER — ONDANSETRON HCL 4 MG/2ML IJ SOLN
INTRAMUSCULAR | Status: DC | PRN
  Administered 2022-12-13: 4 mg via INTRAVENOUS

## 2022-12-13 MED ORDER — PAPAVERINE HCL 30 MG/ML IJ SOLN
INTRAMUSCULAR | Status: AC
  Filled 2022-12-13: qty 2

## 2022-12-13 MED ORDER — ACETAMINOPHEN 10 MG/ML IV SOLN
INTRAVENOUS | Status: AC
  Administered 2022-12-13: 1000 mg via INTRAVENOUS
  Filled 2022-12-13: qty 100

## 2022-12-13 MED ORDER — CARVEDILOL 3.125 MG PO TABS
3.1250 mg | ORAL_TABLET | Freq: Two times a day (BID) | ORAL | Status: DC
  Administered 2022-12-13 – 2022-12-18 (×8): 3.125 mg via ORAL
  Filled 2022-12-13 (×9): qty 1

## 2022-12-13 MED ORDER — OXYCODONE HCL 5 MG PO TABS
5.0000 mg | ORAL_TABLET | ORAL | Status: DC | PRN
  Administered 2022-12-13 – 2022-12-14 (×7): 10 mg via ORAL
  Administered 2022-12-15: 5 mg via ORAL
  Administered 2022-12-15: 10 mg via ORAL
  Filled 2022-12-13 (×10): qty 2

## 2022-12-13 MED ORDER — BISACODYL 5 MG PO TBEC
5.0000 mg | DELAYED_RELEASE_TABLET | Freq: Every day | ORAL | Status: DC | PRN
  Administered 2022-12-16: 5 mg via ORAL
  Filled 2022-12-13: qty 1

## 2022-12-13 MED ORDER — TRAZODONE HCL 50 MG PO TABS
50.0000 mg | ORAL_TABLET | Freq: Every day | ORAL | Status: DC
  Administered 2022-12-13 – 2022-12-17 (×5): 50 mg via ORAL
  Filled 2022-12-13 (×5): qty 1

## 2022-12-13 MED ORDER — FENTANYL CITRATE (PF) 100 MCG/2ML IJ SOLN
25.0000 ug | INTRAMUSCULAR | Status: DC | PRN
  Administered 2022-12-13 (×2): 50 ug via INTRAVENOUS
  Administered 2022-12-13: 25 ug via INTRAVENOUS

## 2022-12-13 MED ORDER — HEPARIN SODIUM (PORCINE) 5000 UNIT/ML IJ SOLN
5000.0000 [IU] | Freq: Three times a day (TID) | INTRAMUSCULAR | Status: DC
  Administered 2022-12-14 – 2022-12-18 (×13): 5000 [IU] via SUBCUTANEOUS
  Filled 2022-12-13 (×13): qty 1

## 2022-12-13 MED ORDER — ROSUVASTATIN CALCIUM 5 MG PO TABS
5.0000 mg | ORAL_TABLET | Freq: Every day | ORAL | Status: DC
  Administered 2022-12-14 – 2022-12-18 (×5): 5 mg via ORAL
  Filled 2022-12-13 (×5): qty 1

## 2022-12-13 MED ORDER — INSULIN ASPART 100 UNIT/ML IJ SOLN
8.0000 [IU] | Freq: Once | INTRAMUSCULAR | Status: AC
  Administered 2022-12-13: 8 [IU] via SUBCUTANEOUS

## 2022-12-13 MED ORDER — CLEVIDIPINE BUTYRATE 0.5 MG/ML IV EMUL
INTRAVENOUS | Status: DC | PRN
  Administered 2022-12-13: 2 mg/h via INTRAVENOUS
  Administered 2022-12-13: 6 mg/h via INTRAVENOUS

## 2022-12-13 MED ORDER — INSULIN ASPART 100 UNIT/ML IJ SOLN
0.0000 [IU] | INTRAMUSCULAR | Status: AC | PRN
  Administered 2022-12-13 (×2): 4 [IU] via SUBCUTANEOUS
  Filled 2022-12-13: qty 1

## 2022-12-13 MED ORDER — CHLORHEXIDINE GLUCONATE CLOTH 2 % EX PADS: 6.0000 | MEDICATED_PAD | Freq: Once | CUTANEOUS | Status: DC

## 2022-12-13 MED ORDER — LIDOCAINE 2% (20 MG/ML) 5 ML SYRINGE
INTRAMUSCULAR | Status: DC | PRN
  Administered 2022-12-13: 60 mg via INTRAVENOUS

## 2022-12-13 MED ORDER — GABAPENTIN 400 MG PO CAPS
400.0000 mg | ORAL_CAPSULE | Freq: Every day | ORAL | Status: DC
  Administered 2022-12-13 – 2022-12-17 (×5): 400 mg via ORAL
  Filled 2022-12-13 (×5): qty 1

## 2022-12-13 MED ORDER — ROCURONIUM BROMIDE 10 MG/ML (PF) SYRINGE
PREFILLED_SYRINGE | INTRAVENOUS | Status: DC | PRN
  Administered 2022-12-13: 60 mg via INTRAVENOUS
  Administered 2022-12-13 (×5): 20 mg via INTRAVENOUS

## 2022-12-13 MED ORDER — LABETALOL HCL 5 MG/ML IV SOLN: 10.0000 mg | INTRAVENOUS | Status: DC | PRN

## 2022-12-13 MED ORDER — CLEVIDIPINE BUTYRATE 0.5 MG/ML IV EMUL
INTRAVENOUS | Status: AC
  Filled 2022-12-13: qty 50

## 2022-12-13 MED ORDER — SODIUM CHLORIDE 0.9 % IV SOLN
500.0000 mL | Freq: Once | INTRAVENOUS | Status: AC | PRN
  Administered 2022-12-16: 500 mL via INTRAVENOUS

## 2022-12-13 MED ORDER — MAGNESIUM SULFATE 2 GM/50ML IV SOLN: 2.0000 g | Freq: Every day | INTRAVENOUS | Status: DC | PRN

## 2022-12-13 MED ORDER — EZETIMIBE 10 MG PO TABS
10.0000 mg | ORAL_TABLET | Freq: Every day | ORAL | Status: DC
  Administered 2022-12-14 – 2022-12-18 (×5): 10 mg via ORAL
  Filled 2022-12-13 (×5): qty 1

## 2022-12-13 MED ORDER — BUPROPION HCL ER (XL) 150 MG PO TB24
150.0000 mg | ORAL_TABLET | Freq: Every day | ORAL | Status: DC
  Administered 2022-12-14 – 2022-12-18 (×5): 150 mg via ORAL
  Filled 2022-12-13 (×5): qty 1

## 2022-12-13 MED ORDER — CEFAZOLIN SODIUM-DEXTROSE 2-4 GM/100ML-% IV SOLN
2.0000 g | Freq: Three times a day (TID) | INTRAVENOUS | Status: AC
  Administered 2022-12-13 – 2022-12-14 (×2): 2 g via INTRAVENOUS
  Filled 2022-12-13 (×2): qty 100

## 2022-12-13 MED ORDER — ASPIRIN 81 MG PO TBEC
81.0000 mg | DELAYED_RELEASE_TABLET | Freq: Every day | ORAL | Status: DC
  Administered 2022-12-14 – 2022-12-18 (×5): 81 mg via ORAL
  Filled 2022-12-13 (×5): qty 1

## 2022-12-13 MED ORDER — HYDROCHLOROTHIAZIDE 12.5 MG PO CAPS
12.5000 mg | ORAL_CAPSULE | Freq: Every evening | ORAL | Status: DC
  Filled 2022-12-13: qty 1

## 2022-12-13 MED ORDER — HYDROMORPHONE HCL 1 MG/ML IJ SOLN: 0.5000 mg | INTRAMUSCULAR | Status: DC | PRN

## 2022-12-13 MED ORDER — FENTANYL CITRATE (PF) 100 MCG/2ML IJ SOLN
INTRAMUSCULAR | Status: AC
  Filled 2022-12-13: qty 2

## 2022-12-13 MED ORDER — LABETALOL HCL 5 MG/ML IV SOLN
INTRAVENOUS | Status: DC | PRN
  Administered 2022-12-13 (×2): 5 mg via INTRAVENOUS

## 2022-12-13 MED ORDER — HEPARIN SODIUM (PORCINE) 1000 UNIT/ML IJ SOLN
INTRAMUSCULAR | Status: DC | PRN
  Administered 2022-12-13: 2000 [IU] via INTRAVENOUS
  Administered 2022-12-13: 9000 [IU] via INTRAVENOUS

## 2022-12-13 MED ORDER — INSULIN ASPART 100 UNIT/ML IJ SOLN
0.0000 [IU] | Freq: Three times a day (TID) | INTRAMUSCULAR | Status: DC
  Administered 2022-12-13 – 2022-12-14 (×2): 8 [IU] via SUBCUTANEOUS

## 2022-12-13 MED ORDER — ORAL CARE MOUTH RINSE: 15.0000 mL | Freq: Once | OROMUCOSAL | Status: AC

## 2022-12-13 MED ORDER — HYDRALAZINE HCL 20 MG/ML IJ SOLN: 5.0000 mg | INTRAMUSCULAR | Status: DC | PRN

## 2022-12-13 MED ORDER — LACTATED RINGERS IV SOLN: INTRAVENOUS | Status: DC

## 2022-12-13 MED ORDER — ONDANSETRON HCL 4 MG/2ML IJ SOLN
4.0000 mg | Freq: Four times a day (QID) | INTRAMUSCULAR | Status: DC | PRN
  Administered 2022-12-13: 4 mg via INTRAVENOUS
  Filled 2022-12-13: qty 2

## 2022-12-13 MED ORDER — GUAIFENESIN-DM 100-10 MG/5ML PO SYRP: 15.0000 mL | ORAL_SOLUTION | ORAL | Status: DC | PRN

## 2022-12-13 MED ORDER — METFORMIN HCL 500 MG PO TABS
500.0000 mg | ORAL_TABLET | Freq: Two times a day (BID) | ORAL | Status: DC
  Administered 2022-12-14 – 2022-12-17 (×7): 500 mg via ORAL
  Filled 2022-12-13 (×7): qty 1

## 2022-12-13 MED ORDER — LISINOPRIL 5 MG PO TABS
5.0000 mg | ORAL_TABLET | Freq: Every evening | ORAL | Status: DC
  Administered 2022-12-13 – 2022-12-17 (×5): 5 mg via ORAL
  Filled 2022-12-13 (×5): qty 1

## 2022-12-13 MED ORDER — HYDROMORPHONE HCL 1 MG/ML IJ SOLN
INTRAMUSCULAR | Status: DC | PRN
  Administered 2022-12-13: .5 mg via INTRAVENOUS

## 2022-12-13 MED ORDER — HYDROMORPHONE HCL 1 MG/ML IJ SOLN
0.5000 mg | INTRAMUSCULAR | Status: DC | PRN
  Administered 2022-12-13 – 2022-12-15 (×13): 1 mg via INTRAVENOUS
  Filled 2022-12-13 (×13): qty 1

## 2022-12-13 MED ORDER — ONDANSETRON HCL 4 MG/2ML IJ SOLN
INTRAMUSCULAR | Status: AC
  Filled 2022-12-13: qty 2

## 2022-12-13 MED ORDER — HYDROCHLOROTHIAZIDE 12.5 MG PO TABS
12.5000 mg | ORAL_TABLET | Freq: Every evening | ORAL | Status: DC
  Administered 2022-12-13 – 2022-12-17 (×5): 12.5 mg via ORAL
  Filled 2022-12-13 (×5): qty 1

## 2022-12-13 MED ORDER — HYDROMORPHONE HCL 1 MG/ML IJ SOLN
INTRAMUSCULAR | Status: AC
  Filled 2022-12-13: qty 0.5

## 2022-12-13 MED ORDER — HYDRALAZINE HCL 20 MG/ML IJ SOLN
INTRAMUSCULAR | Status: DC | PRN
  Administered 2022-12-13 (×4): 5 mg via INTRAVENOUS

## 2022-12-13 MED ORDER — FENTANYL CITRATE (PF) 100 MCG/2ML IJ SOLN
INTRAMUSCULAR | Status: AC
  Administered 2022-12-13: 25 ug via INTRAVENOUS
  Filled 2022-12-13: qty 2

## 2022-12-13 MED ORDER — INSULIN ASPART 100 UNIT/ML IJ SOLN
INTRAMUSCULAR | Status: AC
  Administered 2022-12-13: 4 [IU] via SUBCUTANEOUS
  Filled 2022-12-13: qty 1

## 2022-12-13 MED ORDER — CHLORHEXIDINE GLUCONATE 0.12 % MT SOLN
15.0000 mL | Freq: Once | OROMUCOSAL | Status: AC
  Administered 2022-12-13: 15 mL via OROMUCOSAL
  Filled 2022-12-13: qty 15

## 2022-12-13 MED ORDER — ACETAMINOPHEN 325 MG PO TABS
325.0000 mg | ORAL_TABLET | ORAL | Status: DC | PRN
  Administered 2022-12-15 – 2022-12-18 (×4): 650 mg via ORAL
  Filled 2022-12-13 (×4): qty 2

## 2022-12-13 MED ORDER — HYDROMORPHONE HCL 1 MG/ML IJ SOLN
INTRAMUSCULAR | Status: AC
  Filled 2022-12-13: qty 1

## 2022-12-13 SURGICAL SUPPLY — 61 items
ADH SKN CLS APL DERMABOND .7 (GAUZE/BANDAGES/DRESSINGS) ×3
AGENT HMST KT MTR STRL THRMB (HEMOSTASIS) ×1
BAG COUNTER SPONGE SURGICOUNT (BAG) ×1 IMPLANT
BAG ISL DRAPE 18X18 STRL (DRAPES) ×1
BAG ISOLATION DRAPE 18X18 (DRAPES) IMPLANT
BAG SPNG CNTER NS LX DISP (BAG) ×1
BANDAGE ESMARK 6X9 LF (GAUZE/BANDAGES/DRESSINGS) IMPLANT
BLADE CLIPPER SURG (BLADE) ×1 IMPLANT
BNDG CMPR 9X6 STRL LF SNTH (GAUZE/BANDAGES/DRESSINGS)
BNDG CMPR MED 10X6 ELC LF (GAUZE/BANDAGES/DRESSINGS) ×3
BNDG ELASTIC 6X10 VLCR STRL LF (GAUZE/BANDAGES/DRESSINGS) IMPLANT
BNDG ESMARK 6X9 LF (GAUZE/BANDAGES/DRESSINGS)
CANISTER SUCT 3000ML PPV (MISCELLANEOUS) ×1 IMPLANT
CANNULA VESSEL 3MM 2 BLNT TIP (CANNULA) IMPLANT
CLIP TI MEDIUM 24 (CLIP) ×1 IMPLANT
CLIP TI WIDE RED SMALL 24 (CLIP) ×1 IMPLANT
COVER PROBE W GEL 5X96 (DRAPES) ×1 IMPLANT
CUFF TOURN SGL QUICK 18X4 (TOURNIQUET CUFF) IMPLANT
CUFF TOURN SGL QUICK 24 (TOURNIQUET CUFF)
CUFF TOURN SGL QUICK 34 (TOURNIQUET CUFF)
CUFF TOURN SGL QUICK 42 (TOURNIQUET CUFF) IMPLANT
CUFF TRNQT CYL 24X4X16.5-23 (TOURNIQUET CUFF) IMPLANT
CUFF TRNQT CYL 34X4.125X (TOURNIQUET CUFF) IMPLANT
DERMABOND ADVANCED .7 DNX12 (GAUZE/BANDAGES/DRESSINGS) ×1 IMPLANT
DRAIN CHANNEL 15F RND FF W/TCR (WOUND CARE) IMPLANT
DRAPE C-ARM 42X72 X-RAY (DRAPES) ×1 IMPLANT
DRAPE HALF SHEET 40X57 (DRAPES) IMPLANT
ELECT REM PT RETURN 9FT ADLT (ELECTROSURGICAL) ×1
ELECTRODE REM PT RTRN 9FT ADLT (ELECTROSURGICAL) ×1 IMPLANT
EVACUATOR SILICONE 100CC (DRAIN) IMPLANT
GLOVE BIOGEL PI IND STRL 8 (GLOVE) ×1 IMPLANT
GOWN STRL REUS W/ TWL LRG LVL3 (GOWN DISPOSABLE) ×2 IMPLANT
GOWN STRL REUS W/TWL 2XL LVL3 (GOWN DISPOSABLE) ×2 IMPLANT
GOWN STRL REUS W/TWL LRG LVL3 (GOWN DISPOSABLE) ×2
KIT BASIN OR (CUSTOM PROCEDURE TRAY) ×1 IMPLANT
KIT TURNOVER KIT B (KITS) ×1 IMPLANT
MARKER SKIN DUAL TIP RULER LAB (MISCELLANEOUS) IMPLANT
NS IRRIG 1000ML POUR BTL (IV SOLUTION) ×2 IMPLANT
PACK PERIPHERAL VASCULAR (CUSTOM PROCEDURE TRAY) ×1 IMPLANT
PAD ARMBOARD 7.5X6 YLW CONV (MISCELLANEOUS) ×2 IMPLANT
PUNCH AORTIC ROTATE 5MM 8IN (MISCELLANEOUS) IMPLANT
SPONGE T-LAP 18X18 ~~LOC~~+RFID (SPONGE) IMPLANT
STOPCOCK 4 WAY LG BORE MALE ST (IV SETS) ×1 IMPLANT
SURGIFLO W/THROMBIN 8M KIT (HEMOSTASIS) IMPLANT
SUT MNCRL AB 4-0 PS2 18 (SUTURE) ×2 IMPLANT
SUT PROLENE 5 0 C 1 24 (SUTURE) ×1 IMPLANT
SUT PROLENE 6 0 BV (SUTURE) ×1 IMPLANT
SUT PROLENE 7 0 BV 1 (SUTURE) IMPLANT
SUT SILK 2 0 PERMA HAND 18 BK (SUTURE) IMPLANT
SUT SILK 2 0 SH (SUTURE) ×1 IMPLANT
SUT SILK 3 0 (SUTURE) ×2
SUT SILK 3-0 18XBRD TIE 12 (SUTURE) IMPLANT
SUT VIC AB 2-0 CT1 27 (SUTURE) ×4
SUT VIC AB 2-0 CT1 TAPERPNT 27 (SUTURE) ×2 IMPLANT
SUT VIC AB 3-0 SH 27 (SUTURE) ×5
SUT VIC AB 3-0 SH 27X BRD (SUTURE) ×2 IMPLANT
TOWEL GREEN STERILE (TOWEL DISPOSABLE) ×1 IMPLANT
TRAY FOLEY MTR SLVR 16FR STAT (SET/KITS/TRAYS/PACK) ×1 IMPLANT
TUBING EXTENTION W/L.L. (IV SETS) ×1 IMPLANT
UNDERPAD 30X36 HEAVY ABSORB (UNDERPADS AND DIAPERS) ×1 IMPLANT
WATER STERILE IRR 1000ML POUR (IV SOLUTION) ×1 IMPLANT

## 2022-12-13 NOTE — Anesthesia Procedure Notes (Signed)
Procedure Name: Intubation Date/Time: 12/13/2022 11:35 AM  Performed by: Randon Goldsmith, CRNAPre-anesthesia Checklist: Patient identified, Emergency Drugs available, Suction available and Patient being monitored Patient Re-evaluated:Patient Re-evaluated prior to induction Oxygen Delivery Method: Circle system utilized Preoxygenation: Pre-oxygenation with 100% oxygen Induction Type: IV induction and Cricoid Pressure applied Ventilation: Mask ventilation without difficulty and Oral airway inserted - appropriate to patient size Laryngoscope Size: Mac and 4 Grade View: Grade I Tube type: Oral Tube size: 7.5 mm Number of attempts: 1 Airway Equipment and Method: Stylet and Oral airway Placement Confirmation: ETT inserted through vocal cords under direct vision, positive ETCO2 and breath sounds checked- equal and bilateral Secured at: 22 cm Tube secured with: Tape Dental Injury: Teeth and Oropharynx as per pre-operative assessment

## 2022-12-13 NOTE — Anesthesia Preprocedure Evaluation (Addendum)
Anesthesia Evaluation  Patient identified by MRN, date of birth, ID band Patient awake    Reviewed: Allergy & Precautions, NPO status , Patient's Chart, lab work & pertinent test results  Airway Mallampati: II  TM Distance: >3 FB Neck ROM: Full    Dental  (+) Poor Dentition, Missing   Pulmonary asthma , sleep apnea , Current Smoker   Pulmonary exam normal        Cardiovascular hypertension, Pt. on medications and Pt. on home beta blockers + Peripheral Vascular Disease   Rhythm:Regular Rate:Normal     Neuro/Psych    Depression    negative neurological ROS     GI/Hepatic Neg liver ROS, PUD,GERD  Medicated,,  Endo/Other  diabetes, Type 2, Oral Hypoglycemic Agents, Insulin Dependent    Renal/GU   negative genitourinary   Musculoskeletal  (+) Arthritis , Osteoarthritis,    Abdominal Normal abdominal exam  (+)   Peds  Hematology Lab Results      Component                Value               Date                      WBC                      6.5                 07/15/2022                HGB                      19.4 (H)            12/08/2022                HCT                      57.0 (H)            12/08/2022                MCV                      95                  07/15/2022                PLT                      112 (L)             07/15/2022             Lab Results      Component                Value               Date                      NA                       139                 12/08/2022                K  4.9                 12/08/2022                CO2                      22                  07/15/2022                GLUCOSE                  200 (H)             12/08/2022                BUN                      25 (H)              12/08/2022                CREATININE               0.90                12/08/2022                CALCIUM                  9.0                  07/15/2022                EGFR                     61                  07/15/2022                GFRNONAA                 >60                 07/09/2022              Anesthesia Other Findings   Reproductive/Obstetrics                             Anesthesia Physical Anesthesia Plan  ASA: 3  Anesthesia Plan: General   Post-op Pain Management:    Induction: Intravenous  PONV Risk Score and Plan: 1 and Ondansetron, Dexamethasone and Treatment may vary due to age or medical condition  Airway Management Planned: Mask and Oral ETT  Additional Equipment: Arterial line  Intra-op Plan:   Post-operative Plan: Extubation in OR  Informed Consent: I have reviewed the patients History and Physical, chart, labs and discussed the procedure including the risks, benefits and alternatives for the proposed anesthesia with the patient or authorized representative who has indicated his/her understanding and acceptance.     Dental advisory given  Plan Discussed with: CRNA  Anesthesia Plan Comments:        Anesthesia Quick Evaluation

## 2022-12-13 NOTE — Transfer of Care (Signed)
Immediate Anesthesia Transfer of Care Note  Patient: Jim [name]  Procedure(s) Performed: RIGHT COMMON FEMORAL-POSTERIOR TIBIAL ARTERY BYPASS WITH VEIN (Right: Leg Upper)  Patient Location: PACU  Anesthesia Type:General  Level of Consciousness: awake, alert , and oriented  Airway & Oxygen Therapy: Patient Spontanous Breathing and Patient connected to face mask oxygen  Post-op Assessment: Report given to RN and Post -op Vital signs reviewed and stable  Post vital signs: Reviewed and stable  Last Vitals:  Vitals Value Taken Time  BP 125/66 12/13/22 1556  Temp    Pulse 71 12/13/22 1558  Resp 24 12/13/22 1558  SpO2 92 % 12/13/22 1558  Vitals shown include unvalidated device data.  Last Pain:  Vitals:   12/13/22 1023  TempSrc:   PainSc: 5          Complications: No notable events documented.

## 2022-12-13 NOTE — Op Note (Signed)
NAME: Jim [name]    MRN: [id number] DOB: May 05, 1956    DATE OF OPERATION: 12/13/2022  PREOP DIAGNOSIS:    Right leg critical limb ischemia with rest pain   POSTOP DIAGNOSIS:    Same  PROCEDURE:    Right leg Femoral to posterior tibial artery bypass using ipsilateral, non-reversed greater saphenous vein.   SURGEON: [name]  ASSIST: Nathanial Rancher. PA  ANESTHESIA: General   EBL:   INDICATIONS:    Jim [name] is a 67 y.o. male with right lower extremity critical ischemia with rest pain.  Recent angiogram demonstrated occluded superficial femoral artery with reconstitution at the below-knee popliteal artery.  There is a high bifurcation of the tibial vessels in the normal P2 segment of the popliteal artery.  After discussing the risk and benefits of right leg femoral to posterior tibial artery bypass to alleviate rest pain, [name] elected to proceed.  FINDINGS:   Mild atherosclerotic disease in the femoral artery.  This did not require endarterectomy 5-3 mm tapering greater saphenous vein 3 mm posterior tibial artery, no significant calcific disease   TECHNIQUE:   Patient brought to the OR laid in supine position.  General anesthesia was induced the patient was prepped and draped in standard fashion.  The case began with ultrasound insonation of the entirety of the right greater saphenous vein.  This was marked.  Next, the ultrasound was used to mark the common femoral bifurcation.  An oblique incision was made in the right groin and common femoral artery exposed in standard fashion.  The artery was fragile, and did not appear normal.  There was no significant calcific disease.  Next, my attention turned to the below-knee popliteal artery.  There is a high bifurcation of the tibial vessels therefore the popliteal artery could not be exposed from below the knee.  I elected instead to expose the proximal posterior tibial artery through a normal below-knee  popliteal artery incision.  Saphenectomy followed in standard fashion through several skip incisions.  The vein appeared to be healthy, and of adequate size to be used as conduit.  Following saphenectomy, the vein was prepped on the back table.  It demonstrated excellent size when pressurized.  A Gore tunneler was brought to the field and tunneled and anatomic fashion like a standard below-knee popliteal artery bypass.  Next, the patient was heparinized and the common femoral artery controlled with use of vessel loops and clamps.  An arteriotomy was made and 5 mm aortic hole punch used to make a large anastomosis with the bypass graft.  The first valve system was resected under direct visualization, and the greater saphenous vein was sewn in end-to-side fashion using running 5-0 Prolene suture.  Next, the vein was pressurized, and the valve was lysed under direct visualization using a hand-held valvulotome.  The bypass was pressurized and straightened.  It was marked to ensure that there was no twisting or kinking when tunneled.  Next, the bypass was tunneled with special attention to ensure there was no kinking.  There is a palpable pulse at the distal aspect.  The bypass was brought to length, transected at the level of the proximal posterior tibial artery.  The posterior tibial artery was controlled with the use of bulldog clamps, and a 4 cm arteriotomy made.  The distal anastomosis was sewn using 6-0 Prolene suture in running fashion.  The artery was bled prior to completion, and upon completion there was a palpable pulse in the  foot.  This augmented appropriately with bypass occlusion.  At this point, heparin was reversed.  Wounds were irrigated with saline, hemostasis achieved with cautery and suture, and wounds closed in layers using Vicryl suture with Monocryl and Dermabond at the level of the skin.  Ladonna Snide, MD [facility] DATE OF DICTATION:   12/13/2022

## 2022-12-13 NOTE — Progress Notes (Signed)
PHARMACIST LIPID MONITORING   NEWMAN NAVRATIL is a 67 y.o. male admitted on 12/13/2022 with PVD.  Pharmacy has been consulted to optimize lipid-lowering therapy with the indication of secondary prevention for clinical ASCVD.  Recent Labs:  Lipid Panel (last 6 months):   Lab Results  Component Value Date   CHOL 181 11/01/2022   TRIG 191 (H) 11/01/2022   HDL 49 11/01/2022   CHOLHDL 3.7 11/01/2022   LDLCALC 99 11/01/2022    Hepatic function panel (last 6 months):   Lab Results  Component Value Date   AST 22 12/13/2022   ALT 40 12/13/2022   ALKPHOS 62 12/13/2022   BILITOT 0.8 12/13/2022    SCr (since admission):   Serum creatinine: 0.95 mg/dL 16/10/96 0454 Estimated creatinine clearance: 84.7 mL/min  Current therapy and lipid therapy tolerance Current lipid-lowering therapy: Crestor 5mg  on MoFr Previous lipid-lowering therapies (if applicable): Pravastatin Documented or reported allergies or intolerances to lipid-lowering therapies (if applicable): Pravastatin- myalgia   Plan:    1.Statin intensity (high intensity recommended for all patients regardless of the LDL):  Statin intolerance noted. No statin changes due to serious side effects (ex. Myalgias with at least 2 different statins).  2.Add ezetimibe (if any one of the following):   Already on Zetia  3.Refer to lipid clinic:   Yes  4.Follow-up with:  Lipid clinic referral.  5.Follow-up labs after discharge:  No changes in lipid therapy, repeat a lipid panel in one year.      Harland German, PharmD Clinical Pharmacist **Pharmacist phone directory can now be found on [email or web address] ([email or web address]).  Listed under [facility].

## 2022-12-13 NOTE — Anesthesia Procedure Notes (Signed)
Arterial Line Insertion Start/End5/13/2024 10:45 AM, 12/13/2022 11:00 AM Performed by: Lelon Perla, CRNA, CRNA  Patient location: Pre-op. Preanesthetic checklist: patient identified, IV checked, site marked, risks and benefits discussed, surgical consent, monitors and equipment checked, pre-op evaluation, timeout performed and anesthesia consent Lidocaine 1% used for infiltration Right, radial was placed Catheter size: 20 G Hand hygiene performed  and maximum sterile barriers used   Attempts: 1 Procedure performed without using ultrasound guided technique. Following insertion, dressing applied and Biopatch. Post procedure assessment: normal and unchanged  Patient tolerated the procedure well with no immediate complications.

## 2022-12-13 NOTE — H&P (Signed)
Patient seen and examined in preop holding.  No complaints. No changes to medication history or physical exam since last seen in clinic. Claudication has progressed to rest pain at night in the right leg Recent angiogram demonstrated flush occlusion long segment SFA occlusion with reconstitution at the BK pop. High tibial takeoff After discussing the risks and benefits of right CFA to posterior tibial bypass to relieve rest pain, Jim [name] elected to proceed.   He realizes this will require either recanalization of previous stents versus open bypass surgery.  [name] MD   Vascular and Vein Specialist of   Patient name: Jim [name] MRN: [id number] DOB: [birth year] Sex: male  REASON FOR CONSULT: Evaluation intermittent claudication  HPI: Jim [name] is a 67 y.o. male, who is here today for evaluation of right greater than left leg intermittent claudication.  He was seen in our office in 2019 with similar symptoms.  He underwent arteriography at [facility] on 05/19/2018.  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  He had successful crossing of this occlusion and angioplasty and stenting of the lesion.  He did not return for follow-up.  He presents now with progressive claudication symptoms.  He reports that the right leg is worse than the left.  He reports that this makes it difficult for him to do his daily activities even around the house.  He reports that he enjoys doing yard work and gardening and is unable to do this due to his claudication symptoms.  This is calf claudication only.  He does not have any tissue loss.  Past Medical History:  Diagnosis Date   Arthritis    Asthma    Back pain    Bursitis of hip    Bilateral   Depression    Diabetes mellitus without complication (HCC)    DJD (degenerative  joint disease) of knee    Bilateral Left > Right   Esophageal ulcer    Ex-smoker    Quit 11/07/2011   Gastric ulcer    GERD (gastroesophageal reflux disease)    High cholesterol    History of colon polyps    Hyperlipidemia    Hypertension    Primary localized osteoarthritis of right knee    PVD (peripheral vascular disease) with claudication (HCC) 05/21/2018   Under the care of vascular surgery   Sleep apnea     Family History  Problem Relation Age of Onset   Heart disease Mother    Heart disease Father    Diabetes type I Father    Hypertension Father    Cancer Father    Heart disease Sister    Heart disease Brother     SOCIAL HISTORY: Social History   Socioeconomic History   Marital status: Married    Spouse name: Not on file   Number of children: Not on file   Years of education: Not on file   Highest education level: 12th grade  Occupational History   Occupation: disabled    Employer: OTHER  Tobacco Use   Smoking status: Every Day    Packs/day: 1.00    Years: 40.00    Additional pack years: 0.00  Total pack years: 40.00    Types: Cigarettes    Passive exposure: Current   Smokeless tobacco: Never  Vaping Use   Vaping Use: Never used  Substance and Sexual Activity   Alcohol use: Not Currently   Drug use: No   Sexual activity: Yes  Other Topics Concern   Not on file  Social History Narrative   Not on file   Social Determinants of Health   Financial Resource Strain: Patient Declined (11/01/2022)   Overall Financial Resource Strain (CARDIA)    Difficulty of Paying Living Expenses: Patient declined  Food Insecurity: Food Insecurity Present (11/01/2022)   Hunger Vital Sign    Worried About Running Out of Food in the Last Year: Sometimes true    Ran Out of Food in the Last Year: Never true  Transportation Needs: No Transportation Needs (11/01/2022)   PRAPARE - Administrator, Civil Service (Medical): No    Lack of Transportation (Non-Medical):  No  Physical Activity: Inactive (11/01/2022)   Exercise Vital Sign    Days of Exercise per Week: 0 days    Minutes of Exercise per Session: 20 min  Stress: No Stress Concern Present (11/01/2022)   [employer] of Occupational Health - Occupational Stress Questionnaire    Feeling of Stress : Only a little  Social Connections: Moderately Isolated (11/01/2022)   Social Connection and Isolation Panel [NHANES]    Frequency of Communication with Friends and Family: More than three times a week    Frequency of Social Gatherings with Friends and Family: More than three times a week    Attends Religious Services: Never    Database administrator or Organizations: No    Attends Banker Meetings: Never    Marital Status: Married  Catering manager Violence: Not At Risk (05/31/2022)   Humiliation, Afraid, Rape, and Kick questionnaire    Fear of Current or Ex-Partner: No    Emotionally Abused: No    Physically Abused: No    Sexually Abused: No    Allergies  Allergen Reactions   Doxycycline Nausea Only    PATIENT STILL TOLERATES   Pravastatin Other (See Comments)    myalgia    Current Facility-Administered Medications  Medication Dose Route Frequency Provider Last Rate Last Admin   0.9 %  sodium chloride infusion   Intravenous Continuous [name], MD       ceFAZolin (ANCEF) IVPB 2g/100 mL premix  2 g Intravenous 30 min Pre-Op [name], MD       Chlorhexidine Gluconate Cloth 2 % PADS 6 each  6 each Topical Once [name], MD       And   Chlorhexidine Gluconate Cloth 2 % PADS 6 each  6 each Topical Once [name], MD       insulin aspart (novoLOG) injection 0-14 Units  0-14 Units Subcutaneous Q2H PRN Jairo Ben, MD   4 Units at 12/13/22 1059   lactated ringers infusion   Intravenous Continuous Jairo Ben, MD 10 mL/hr at 12/13/22 1044 New Bag at 12/13/22 1044    REVIEW OF SYSTEMS:  [X]  denotes positive finding, [ ]  denotes negative  finding Cardiac  Comments:  Chest pain or chest pressure:    Shortness of breath upon exertion:    Short of breath when lying flat:    Irregular heart rhythm:        Vascular    Pain in calf, thigh, or hip brought on by ambulation:  x   Pain in feet at night that wakes you up from your sleep:  x   Blood clot in your veins:    Leg swelling:  x       Pulmonary    Oxygen at home:    Productive cough:     Wheezing:  x       Neurologic    Sudden weakness in arms or legs:  x   Sudden numbness in arms or legs:  x   Sudden onset of difficulty speaking or slurred speech:    Temporary loss of vision in one eye:     Problems with dizziness:         Gastrointestinal    Blood in stool:     Vomited blood:         Genitourinary    Burning when urinating:     Blood in urine:        Psychiatric    Major depression:  x       Hematologic    Bleeding problems:    Problems with blood clotting too easily:        Skin    Rashes or ulcers:        Constitutional    Fever or chills:      PHYSICAL EXAM: Vitals:   12/10/22 1051 12/13/22 0948  BP:  124/78  Pulse:  (!) 56  Resp:  16  Temp:  98 F (36.7 C)  TempSrc:  Oral  SpO2:  96%  Weight: 95.3 kg 95.3 kg  Height: 5\' 9"  (1.753 m) 5\' 9"  (1.753 m)    GENERAL: The patient is a well-nourished male, in no acute distress. The vital signs are documented above. CARDIOVASCULAR: 2+ radial pulses.  2+ femoral pulses bilaterally.  Absent popliteal and distal pulses bilaterally PULMONARY: There is good air exchange  MUSCULOSKELETAL: There are no major deformities or cyanosis. NEUROLOGIC: No focal weakness or paresthesias are detected. SKIN: There are no ulcers or rashes noted. PSYCHIATRIC: The patient has a normal affect.  DATA:  Noninvasive studies reveal ankle arm index of 0.57 on the right with monophasic flow and 0.76 on the left with monophasic flow  MEDICAL ISSUES: Discussed these findings at length with the patient.  Explained  that he appears to have had recurrent disease in his right leg and progression on the left.  I explained that this is not limb threatening.  I explained that it would be safe to continue his walking program.  He reports that he is severely limited and is not comfortable with this level of claudication.  I did discuss the option of arteriography and possible reintervention.  Also explained that in all likelihood this may require femoral-popliteal bypass on the right for symptom resolution.  He wishes to proceed with outpatient arteriography and possible endovascular treatment for his severe superficial femoral artery occlusive disease.  We will coordinate this as an outpatient at his convenience   Larina Earthly, MD [facility] Vascular and Vein Specialists of [facility] Tel [phone] Pager [phone]  Note: Portions of this report may have been transcribed using voice recognition software.  Every effort has been made to ensure accuracy; however, inadvertent computerized transcription errors may still be present.

## 2022-12-13 NOTE — Discharge Instructions (Signed)
 Vascular and Vein Specialists of [place]  Discharge instructions  Lower Extremity Bypass Surgery  Please refer to the following instruction for your post-procedure care. Your surgeon or physician assistant will discuss any changes with you.  Activity  You are encouraged to walk as much as you can. You can slowly return to normal activities during the month after your surgery. Avoid strenuous activity and heavy lifting until your doctor tells you it's OK. Avoid activities such as vacuuming or swinging a golf club. Do not drive until your doctor give the OK and you are no longer taking prescription pain medications. It is also normal to have difficulty with sleep habits, eating and bowel movement after surgery. These will go away with time.  Bathing/Showering  Shower daily after you go home. Do not soak in a bathtub, hot tub, or swim until the incision heals completely.  Incision Care  Clean your incision with mild soap and water. Shower every day. Pat the area dry with a clean towel. You do not need a bandage unless otherwise instructed. Do not apply any ointments or creams to your incision. If you have open wounds you will be instructed how to care for them or a visiting nurse may be arranged for you. If you have staples or sutures along your incision they will be removed at your post-op appointment. You may have skin glue on your incision. Do not peel it off. It will come off on its own in about one week.  Wash the groin wound with soap and water daily and pat dry. (No tub bath-only shower)  Then put a dry gauze or washcloth in the groin to keep this area dry to help prevent wound infection.  Do this daily and as needed.  Do not use Vaseline or neosporin on your incisions.  Only use soap and water on your incisions and then protect and keep dry.  Diet  Resume your normal diet. There are no special food restrictions following this procedure. A low fat/ low cholesterol diet is  recommended for all patients with vascular disease. In order to heal from your surgery, it is CRITICAL to get adequate nutrition. Your body requires vitamins, minerals, and protein. Vegetables are the best source of vitamins and minerals. Vegetables also provide the perfect balance of protein. Processed food has little nutritional value, so try to avoid this.  Medications  Resume taking all your medications unless your doctor or physician assistant tells you not to. If your incision is causing pain, you may take over-the-counter pain relievers such as acetaminophen (Tylenol). If you were prescribed a stronger pain medication, please aware these medication can cause nausea and constipation. Prevent nausea by taking the medication with a snack or meal. Avoid constipation by drinking plenty of fluids and eating foods with high amount of fiber, such as fruits, vegetables, and grains. Take Colace 100 mg (an over-the-counter stool softener) twice a day as needed for constipation.  Do not take Tylenol if you are taking prescription pain medications.  Follow Up  Our office will schedule a follow up appointment 2-3 weeks following discharge.  Please call us immediately for any of the following conditions  Severe or worsening pain in your legs or feet while at rest or while walking Increase pain, redness, warmth, or drainage (pus) from your incision site(s) Fever of 101 degree or higher The swelling in your leg with the bypass suddenly worsens and becomes more painful than when you were in the hospital If you have   been instructed to feel your graft pulse then you should do so every day. If you can no longer feel this pulse, call the office immediately. Not all patients are given this instruction.  Leg swelling is common after leg bypass surgery.  The swelling should improve over a few months following surgery. To improve the swelling, you may elevate your legs above the level of your heart while you are  sitting or resting. Your surgeon or physician assistant may ask you to apply an ACE wrap or wear compression (TED) stockings to help to reduce swelling.  Reduce your risk of vascular disease  Stop smoking. If you would like help call [facility] at 1-800-QUIT-NOW (1-800-784-8669) or [place] at 336-586-4000.  Manage your cholesterol Maintain a desired weight Control your diabetes weight Control your diabetes Keep your blood pressure down  If you have any questions, please call the office at [phone]  

## 2022-12-13 NOTE — Anesthesia Postprocedure Evaluation (Signed)
Anesthesia Post Note  Patient: WENCESLAO GROMEK  Procedure(s) Performed: RIGHT COMMON FEMORAL-POSTERIOR TIBIAL ARTERY BYPASS WITH VEIN (Right: Leg Upper)     Patient location during evaluation: PACU Anesthesia Type: General Level of consciousness: awake and alert Pain management: pain level controlled Vital Signs Assessment: post-procedure vital signs reviewed and stable Respiratory status: spontaneous breathing, nonlabored ventilation, respiratory function stable and patient connected to nasal cannula oxygen Cardiovascular status: blood pressure returned to baseline and stable Postop Assessment: no apparent nausea or vomiting Anesthetic complications: no   No notable events documented.  Last Vitals:  Vitals:   12/13/22 1630 12/13/22 1645  BP: 138/74 (!) 142/71  Pulse: 71 71  Resp: 10 10  Temp:  36.6 C  SpO2: 93% 93%    Last Pain:  Vitals:   12/13/22 1645  TempSrc:   PainSc: 4                  Orella Cushman P Dezmon Conover

## 2022-12-13 NOTE — Progress Notes (Signed)
Pt admitted to rm 26 from PACU. Pt came with A-line but A-line cord is out of stock. Charge nurse notified. CHG wipe given. Initiated tele. Oriented pt to the unit. VSS. Call bell within reach.   Lawson Radar, RN

## 2022-12-14 ENCOUNTER — Encounter (HOSPITAL_COMMUNITY): Payer: Self-pay | Admitting: Vascular Surgery

## 2022-12-14 LAB — GLUCOSE, CAPILLARY
Glucose-Capillary: 258 mg/dL — ABNORMAL HIGH (ref 70–99)
Glucose-Capillary: 138 mg/dL — ABNORMAL HIGH (ref 70–99)
Glucose-Capillary: 128 mg/dL — ABNORMAL HIGH (ref 70–99)
Glucose-Capillary: 170 mg/dL — ABNORMAL HIGH (ref 70–99)

## 2022-12-14 LAB — CBC
HCT: 43.4 % (ref 39.0–52.0)
Hemoglobin: 13.9 g/dL (ref 13.0–17.0)
MCH: 30.6 pg (ref 26.0–34.0)
MCHC: 32 g/dL (ref 30.0–36.0)
MCV: 95.6 fL (ref 80.0–100.0)
Platelets: 100 10*3/uL — ABNORMAL LOW (ref 150–400)
RBC: 4.54 MIL/uL (ref 4.22–5.81)
RDW: 13.6 % (ref 11.5–15.5)
WBC: 11.3 10*3/uL — ABNORMAL HIGH (ref 4.0–10.5)
nRBC: 0 % (ref 0.0–0.2)

## 2022-12-14 LAB — BASIC METABOLIC PANEL
Anion gap: 7 (ref 5–15)
BUN: 17 mg/dL (ref 8–23)
CO2: 22 mmol/L (ref 22–32)
Calcium: 8.1 mg/dL — ABNORMAL LOW (ref 8.9–10.3)
Chloride: 103 mmol/L (ref 98–111)
Creatinine, Ser: 0.99 mg/dL (ref 0.61–1.24)
GFR, Estimated: >60 mL/min (ref >60)
Glucose, Bld: 301 mg/dL — ABNORMAL HIGH (ref 70–99)
Potassium: 4 mmol/L (ref 3.5–5.1)
Sodium: 132 mmol/L — ABNORMAL LOW (ref 135–145)

## 2022-12-14 MED ORDER — INSULIN GLARGINE-YFGN 100 UNIT/ML ~~LOC~~ SOLN
8.0000 [IU] | Freq: Every day | SUBCUTANEOUS | Status: DC
  Administered 2022-12-14 – 2022-12-18 (×5): 8 [IU] via SUBCUTANEOUS
  Filled 2022-12-14 (×5): qty 0.08

## 2022-12-14 MED ORDER — INSULIN ASPART 100 UNIT/ML IJ SOLN: 0.0000 [IU] | Freq: Every day | INTRAMUSCULAR | Status: DC

## 2022-12-14 MED ORDER — INSULIN ASPART 100 UNIT/ML IJ SOLN
0.0000 [IU] | Freq: Three times a day (TID) | INTRAMUSCULAR | Status: DC
  Administered 2022-12-14 (×2): 3 [IU] via SUBCUTANEOUS
  Administered 2022-12-15 (×3): 4 [IU] via SUBCUTANEOUS
  Administered 2022-12-16: 3 [IU] via SUBCUTANEOUS
  Administered 2022-12-16: 4 [IU] via SUBCUTANEOUS
  Administered 2022-12-17: 3 [IU] via SUBCUTANEOUS
  Administered 2022-12-17 (×2): 4 [IU] via SUBCUTANEOUS

## 2022-12-14 NOTE — Evaluation (Signed)
Occupational Therapy Evaluation Patient Details Name: Jim [name] MRN: [id number] DOB: [birth year] Today's Date: 12/14/2022   History of Present Illness 67 y.o. male presented to [facility]/13 for R leg femoral to posterior tibial artery bypass for treatment of right lower extremity critical ischemia with rest pain.  PMH: HTN, PVD, DM, DJD, prior smoker.   Clinical Impression   Pt admitted for above dx, PTA patient reports being independent in bADLs/iADLs and using SPC for ambulation, lives with spouse. Pt currently ambulating with min guard assist ~30ft but continues to be limited by pain with functional mobility. Pt also has increased difficulty completing lower body dressing due to R groin/leg pain, discussed the use of AE but pt reports he would rather his spouse help with dressing. Educated pt to pace self and be mindful of energy levels to manage pain, also emphasized ankle pumping for blood clot prevention and elevation of RLE. Pt would benefit from continued acute skilled OT to address above deficits and help transition to next level of care. Patient would benefit from post acute Home OT services to help maximize functional independence in natural environment      Recommendations for follow up therapy are one component of a multi-disciplinary discharge planning process, led by the attending physician.  Recommendations may be updated based on patient status, additional functional criteria and insurance authorization.   Assistance Recommended at Discharge Intermittent Supervision/Assistance  Patient can return home with the following A lot of help with bathing/dressing/bathroom;Assistance with cooking/housework;Assist for transportation;Help with stairs or ramp for entrance;A little help with walking and/or transfers    Functional Status Assessment  Patient has had a recent decline in their functional status and demonstrates the ability to make significant improvements in function in a  reasonable and predictable amount of time.  Equipment Recommendations  Tub/shower bench    Recommendations for Other Services       Precautions / Restrictions Precautions Precautions: None Restrictions Weight Bearing Restrictions: No      Mobility Bed Mobility Overal bed mobility: Needs Assistance Bed Mobility: Sit to Supine       Sit to supine: Min guard   General bed mobility comments: Pt rec'd sitting in recliner, pt uses techniques from prior knee surgeries to have LLE assist the RLE with bed mobility    Transfers Overall transfer level: Needs assistance Equipment used: Rolling walker (2 wheels) Transfers: Sit to/from Stand Sit to Stand: Min guard           General transfer comment: STS from chair and one STS from EOB, both Min guard. Educated pt on extending RLE to avoid excessive pain when bending RLE when returning to sitting position.      Balance Overall balance assessment: Needs assistance Sitting-balance support: Feet supported, No upper extremity supported Sitting balance-Leahy Scale: Good     Standing balance support: Bilateral upper extremity supported, During functional activity, Reliant on assistive device for balance Standing balance-Leahy Scale: Poor                             ADL either performed or assessed with clinical judgement   ADL Overall ADL's : Needs assistance/impaired Eating/Feeding: Independent;Sitting   Grooming: Standing;Min guard   Upper Body Bathing: Sitting;Set up;Supervision/ safety   Lower Body Bathing: Sitting/lateral leans;Minimal assistance   Upper Body Dressing : Sitting;Supervision/safety   Lower Body Dressing: Maximal assistance;Sitting/lateral leans Lower Body Dressing Details (indicate cue type and reason): Max A to doff/don  R sock, Pt can don L sock using modified approach of placing LE on surface Toilet Transfer: Min guard;Rolling walker (2 wheels);Ambulation   Toileting- Clothing  Manipulation and Hygiene: Supervision/safety;Sitting/lateral lean   Tub/ Shower Transfer: Min guard;Ambulation;Tub bench   Functional mobility during ADLs: Min guard;Rolling walker (2 wheels)       Vision         Perception     Praxis      Pertinent Vitals/Pain Pain Assessment Pain Assessment: 0-10 Pain Score: 6  Pain Location: RLE, Groin, and low back Pain Descriptors / Indicators: Burning, Sharp Pain Intervention(s): Limited activity within patient's tolerance, Monitored during session, Premedicated before session     Hand Dominance Right   Extremity/Trunk Assessment Upper Extremity Assessment Upper Extremity Assessment: Overall WFL for tasks assessed   Lower Extremity Assessment Lower Extremity Assessment: RLE deficits/detail RLE Deficits / Details: LE wrapped from foot to groin with ACE wrap, ROM in knee and hip limited by pain, ankle ROM WFL RLE: Unable to fully assess due to pain RLE Sensation: WNL   Cervical / Trunk Assessment Cervical / Trunk Assessment: Other exceptions (Chronic back pain DDD)   Communication Communication Communication: HOH   Cognition Arousal/Alertness: Awake/alert Behavior During Therapy: WFL for tasks assessed/performed Overall Cognitive Status: Within Functional Limits for tasks assessed                                       General Comments  VSS on RA, RN notified that pt reported spilling tea near groin after eating his lunch    Exercises     Shoulder Instructions      Home Living Family/patient expects to be discharged to:: Private residence Living Arrangements: Spouse/significant other Available Help at Discharge: Family;Available 24 hours/day Type of Home: House Home Access: Stairs to enter [employer] of Steps: 4 Entrance Stairs-Rails: Can reach both Home Layout: One level     Bathroom Shower/Tub: Tub/shower unit;Walk-in shower (pt usually uses tub/shower)   Firefighter:  Standard Bathroom Accessibility: Yes   Home Equipment: Agricultural consultant (2 wheels);Rollator (4 wheels);Hand held shower head;Cane - single point          Prior Functioning/Environment Prior Level of Function : Independent/Modified Independent             Mobility Comments: use a cane for limited community ambulation/ to doctor's office is greatest distance ADLs Comments: independent        OT Problem List: Decreased strength;Decreased activity tolerance;Impaired balance (sitting and/or standing);Pain      OT Treatment/Interventions: Self-care/ADL training;Therapeutic exercise;Therapeutic activities;Energy conservation;DME and/or AE instruction;Patient/family education;Balance training    OT Goals(Current goals can be found in the care plan section) Acute Rehab OT Goals Patient Stated Goal: To go home OT Goal Formulation: With patient Time For Goal Achievement: 12/28/22 Potential to Achieve Goals: Good ADL Goals Pt Will Perform Grooming: standing;with supervision Pt Will Transfer to Toilet: ambulating;with supervision Pt Will Perform Tub/Shower Transfer: tub bench;ambulating;with supervision Additional ADL Goal #2: Pt will demonstrate use of energy conservation strategies to manage pain with functional mobility  OT Frequency: Min 2X/week    Co-evaluation              AM-PAC OT "6 Clicks" Daily Activity     Outcome Measure Help from another person eating meals?: None Help from another person taking care of personal grooming?: A Little Help from another person toileting, which includes  using toliet, bedpan, or urinal?: A Little Help from another person bathing (including washing, rinsing, drying)?: A Little Help from another person to put on and taking off regular upper body clothing?: A Little Help from another person to put on and taking off regular lower body clothing?: A Lot 6 Click Score: 18   End of Session Equipment Utilized During Treatment: Gait  belt;Rolling walker (2 wheels) Nurse Communication: Mobility status  Activity Tolerance: Patient tolerated treatment well Patient left: in bed;with call bell/phone within reach;with bed alarm set  OT Visit Diagnosis: Unsteadiness on feet (R26.81);Other abnormalities of gait and mobility (R26.89);Pain Pain - Right/Left: Right Pain - part of body: Hip;Knee;Leg                Time: 1610-9604 OT Time Calculation (min): 28 min Charges:  OT General Charges $OT Visit: 1 Visit OT Evaluation $OT Eval Moderate Complexity: 1 Mod OT Treatments $Therapeutic Activity: 8-22 mins  12/14/2022  AB, OTR/L  Acute Rehabilitation Services  Office: [phone]   Tristan Schroeder 12/14/2022, 2:50 PM

## 2022-12-14 NOTE — Inpatient Diabetes Management (Signed)
Inpatient Diabetes Program Recommendations  AACE/ADA: New Consensus Statement on Inpatient Glycemic Control (2015)  Target Ranges:  Prepandial:   less than 140 mg/dL      Peak postprandial:   less than 180 mg/dL (1-2 hours)      Critically ill patients:  140 - 180 mg/dL   Lab Results  Component Value Date   GLUCAP 258 (H) 12/14/2022   HGBA1C 7.6 (H) 11/01/2022    Review of Glycemic Control  Latest Reference Range & Units 12/13/22 17:26 12/13/22 21:27 12/14/22 06:14  Glucose-Capillary 70 - 99 mg/dL 161 (H) 096 (H) 045 (H)  (H): Data is abnormally high Diabetes history: Type 2 DM Outpatient Diabetes medications: Metformin 500 mg BID, Jardiance 10 mg QD, Glipizide 2.5 mg QA Current orders for Inpatient glycemic control: Novolog 0-15 units TID, Metformin 500 mg BID, Jardiance 10 mg QD, Glipizide 2.5 mg QA Decadron 5 mg x 1  Inpatient Diabetes Program Recommendations:    Noted hyperglycemia prior to steroid administration.   Consider: - adding Semglee 8 units QD - Increasing correction to Novolog 0-20 units TID  Thanks, Lujean Rave, MSN, RNC-OB Diabetes Coordinator [phone] (8a-5p)

## 2022-12-14 NOTE — Plan of Care (Signed)
  Problem: Cardiovascular: Goal: Ability to achieve and maintain adequate cardiovascular perfusion will improve Outcome: Progressing Goal: Vascular access site(s) Level 0-1 will be maintained Outcome: Progressing   

## 2022-12-14 NOTE — Evaluation (Signed)
Physical Therapy Evaluation Patient Details Name: Jim [name] MRN: [id number] DOB: May 27, 1956 Today's Date: 12/14/2022  History of Present Illness  67 y.o. male presented to [facility] 5/13 for R leg femoral to posterior tibial artery bypass for treatment of right lower extremity critical ischemia with rest pain.  PMH: HTN, PVD, DM, DJD, prior smoker.  Clinical Impression  PTA pt living with wife in single story home with 4 steps to enter. Pt was independent with ADLs, and iADLs, and ambulating limited community distances with SPC. Pt is currently limited by increased R LE pain and decreased R LE ROM. Pt is currently min A for bed mobility and transfer to recliner with RW. Pt will benefit from continued PT acutely and post acutely. PT will also refer to Mobility Specialist to assist in progressing ambulation.        Recommendations for follow up therapy are one component of a multi-disciplinary discharge planning process, led by the attending physician.  Recommendations may be updated based on patient status, additional functional criteria and insurance authorization.     Assistance Recommended at Discharge Frequent or constant Supervision/Assistance  Patient can return home with the following  A little help with walking and/or transfers;A little help with bathing/dressing/bathroom;Assistance with cooking/housework;Assist for transportation;Help with stairs or ramp for entrance    Equipment Recommendations None recommended by PT  Recommendations for Other Services  OT consult    Functional Status Assessment Patient has had a recent decline in their functional status and demonstrates the ability to make significant improvements in function in a reasonable and predictable amount of time.     Precautions / Restrictions Precautions Precautions: None Restrictions Weight Bearing Restrictions: No      Mobility  Bed Mobility Overal bed mobility: Needs Assistance Bed Mobility: Supine to Sit      Supine to sit: Min assist, HOB elevated     General bed mobility comments: able to manage RLE to EoB with assist of L LE, light min A for posterior support with sliding across surgical sheets to come to EoB, use of bedrail to assist    Transfers Overall transfer level: Needs assistance Equipment used: Rolling walker (2 wheels) Transfers: Sit to/from Stand, Bed to chair/wheelchair/BSC Sit to Stand: Min assist, From elevated surface   Step pivot transfers: Min guard       General transfer comment: good power up to RW with light min A for steadying, min guard for step pivot to recliner. Pt limited by R LE pain in dependent position    Ambulation/Gait               General Gait Details: deferred due to pain and increased UE use to advance mobility     Balance Overall balance assessment: Needs assistance Sitting-balance support: Feet supported, No upper extremity supported Sitting balance-Leahy Scale: Good     Standing balance support: Bilateral upper extremity supported, During functional activity, Reliant on assistive device for balance Standing balance-Leahy Scale: Poor                               Pertinent Vitals/Pain Pain Assessment Pain Assessment: 0-10 Pain Score: 5  Pain Location: R LE and low back Pain Descriptors / Indicators: Burning, Sharp Pain Intervention(s): Limited activity within patient's tolerance, Monitored during session, Premedicated before session, Repositioned    Home Living Family/patient expects to be discharged to:: Private residence Living Arrangements: Spouse/significant other Available Help at Discharge: Family;Available 24  hours/day Type of Home: House Home Access: Stairs to enter Entrance Stairs-Rails: Can reach both Entrance Stairs-Number of Steps: 4   Home Layout: One level Home Equipment: Agricultural consultant (2 wheels);Rollator (4 wheels);Hand held shower head;Cane - single point      Prior Function Prior Level of  Function : Independent/Modified Independent             Mobility Comments: use a cane for limited community ambulation/ to doctor's office is greatest distance ADLs Comments: independent     Hand Dominance   Dominant Hand: Right    Extremity/Trunk Assessment   Upper Extremity Assessment Upper Extremity Assessment: Defer to OT evaluation    Lower Extremity Assessment Lower Extremity Assessment: RLE deficits/detail RLE Deficits / Details: LE wrapped from foot to groin with ACE wrap, ROM in knee and hip limited by pain, ankle ROM WFL RLE: Unable to fully assess due to pain    Cervical / Trunk Assessment Cervical / Trunk Assessment: Other exceptions (Chronic back pain DDD)  Communication   Communication: HOH  Cognition Arousal/Alertness: Awake/alert Behavior During Therapy: WFL for tasks assessed/performed Overall Cognitive Status: Within Functional Limits for tasks assessed                                          General Comments General comments (skin integrity, edema, etc.): Ace wrap clean dry and intact, VSS on RA        Assessment/Plan    PT Assessment Patient needs continued PT services  PT Problem List Decreased strength;Decreased range of motion;Decreased activity tolerance;Decreased balance;Decreased mobility;Decreased skin integrity;Pain       PT Treatment Interventions DME instruction;Gait training;Stair training;Functional mobility training;Therapeutic activities;Therapeutic exercise;Balance training;Cognitive remediation;Patient/family education    PT Goals (Current goals can be found in the Care Plan section)  Acute Rehab PT Goals Patient Stated Goal: get back to gardening PT Goal Formulation: With patient/family Time For Goal Achievement: 12/28/22 Potential to Achieve Goals: Fair    Frequency Min 1X/week        AM-PAC PT "6 Clicks" Mobility  Outcome Measure Help needed turning from your back to your side while in a flat  bed without using bedrails?: None Help needed moving from lying on your back to sitting on the side of a flat bed without using bedrails?: A Little Help needed moving to and from a bed to a chair (including a wheelchair)?: A Little Help needed standing up from a chair using your arms (e.g., wheelchair or bedside chair)?: A Little Help needed to walk in hospital room?: A Little Help needed climbing 3-5 steps with a railing? : A Lot 6 Click Score: 18    End of Session Equipment Utilized During Treatment: Gait belt Activity Tolerance: Patient limited by pain Patient left: in chair;with call bell/phone within reach;with family/visitor present Nurse Communication: Mobility status PT Visit Diagnosis: Unsteadiness on feet (R26.81);Other abnormalities of gait and mobility (R26.89);Muscle weakness (generalized) (M62.81);Difficulty in walking, not elsewhere classified (R26.2);Pain Pain - Right/Left: Right Pain - part of body: Leg    Time: 1610-9604 PT Time Calculation (min) (ACUTE ONLY): 33 min   Charges:   PT Evaluation $PT Eval Moderate Complexity: 1 Mod PT Treatments $Therapeutic Activity: 8-22 mins        Tameia Rafferty B. Beverely Risen PT, DPT Acute Rehabilitation Services Please use secure chat or  Call Office [phone]   [facility] 12/14/2022,  11:59 AM

## 2022-12-14 NOTE — Progress Notes (Addendum)
  Progress Note    12/14/2022 6:34 AM 1 Day Post-Op  Subjective:  says he feels sore but his foot feels better.  Afebrile HR 50's-70's NSR 110's-140's systolic 96% RA  Vitals:   12/13/22 1900 12/14/22 0315  BP: 128/84 (!) 142/70  Pulse: 70 (!) 57  Resp: 15 17  Temp: 97.7 F (36.5 C) 97.8 F (36.6 C)  SpO2: 97% 97%    Physical Exam: General:  no distress Cardiac:  regular Lungs:  non labored Incisions:  RLE with ace wrap in place; right groin clean with mild ecchymosis Extremities:  brisk right PT doppler signal; calf is soft Abdomen:  soft  CBC    Component Value Date/Time   WBC 11.3 (H) 12/14/2022 0151   RBC 4.54 12/14/2022 0151   HGB 13.9 12/14/2022 0151   HGB 15.9 07/15/2022 1349   HCT 43.4 12/14/2022 0151   HCT 48.2 07/15/2022 1349   PLT 100 (L) 12/14/2022 0151   PLT 112 (L) 07/15/2022 1349   MCV 95.6 12/14/2022 0151   MCV 95 07/15/2022 1349   MCH 30.6 12/14/2022 0151   MCHC 32.0 12/14/2022 0151   RDW 13.6 12/14/2022 0151   RDW 13.9 07/15/2022 1349   LYMPHSABS 1.7 07/15/2022 1349   MONOABS 0.7 07/07/2022 0926   EOSABS 0.1 07/15/2022 1349   BASOSABS 0.0 07/15/2022 1349    BMET    Component Value Date/Time   NA 132 (L) 12/14/2022 0151   NA 144 07/15/2022 1349   K 4.0 12/14/2022 0151   CL 103 12/14/2022 0151   CO2 22 12/14/2022 0151   GLUCOSE 301 (H) 12/14/2022 0151   BUN 17 12/14/2022 0151   BUN 14 07/15/2022 1349   CREATININE 0.99 12/14/2022 0151   CREATININE 0.93 08/13/2014 0804   CALCIUM 8.1 (L) 12/14/2022 0151   GFRNONAA >60 12/14/2022 0151   GFRAA 77 05/22/2019 0942    INR    Component Value Date/Time   INR 1.0 12/13/2022 0934     Intake/Output Summary (Last 24 hours) at 12/14/2022 0634 Last data filed at 12/14/2022 0618 Gross per 24 hour  Intake 3163.44 ml  Output 4150 ml  Net -986.56 ml      Assessment/Plan:  67 y.o. male is s/p:  Right leg Femoral to posterior tibial artery bypass using ipsilateral, non-reversed  greater saphenous vein.   1 Day Post-Op   -pt with brisk doppler signal right PT -discussed groin wound care with pt to protect incision with dry gauze.  -pt with hperglycemia-will ask DM coordinator to help with management of DM.  Pt states his sugars do not usually run this high but have been up for a couple of days. -will need to mobilize out of bed today. -discussed importance of smoking cessation.  He says he quit for 8 years and started back a year ago. -DVT prophylaxis:  sq heparin   Doreatha Massed, PA-C [facility] [phone] 12/14/2022 6:34 AM   VASCULAR STAFF ADDENDUM: I have independently interviewed and examined the patient. I agree with the above.  Groin soft, PT palpable, wrap in place OOB today  J. Gillis Santa, MD [facility] Phone Number: [phone] 12/14/2022 8:13 AM

## 2022-12-15 LAB — GLUCOSE, CAPILLARY
Glucose-Capillary: 157 mg/dL — ABNORMAL HIGH (ref 70–99)
Glucose-Capillary: 198 mg/dL — ABNORMAL HIGH (ref 70–99)
Glucose-Capillary: 159 mg/dL — ABNORMAL HIGH (ref 70–99)
Glucose-Capillary: 142 mg/dL — ABNORMAL HIGH (ref 70–99)

## 2022-12-15 MED ORDER — HYDROMORPHONE HCL 1 MG/ML IJ SOLN
1.0000 mg | INTRAMUSCULAR | Status: DC | PRN
  Administered 2022-12-15 – 2022-12-16 (×3): 1 mg via INTRAMUSCULAR
  Filled 2022-12-15 (×4): qty 1

## 2022-12-15 MED ORDER — KETOROLAC TROMETHAMINE 30 MG/ML IJ SOLN
30.0000 mg | Freq: Four times a day (QID) | INTRAMUSCULAR | Status: AC | PRN
  Administered 2022-12-16 (×3): 30 mg via INTRAVENOUS
  Filled 2022-12-15 (×3): qty 1

## 2022-12-15 MED ORDER — SODIUM CHLORIDE 0.9 % IV BOLUS
1000.0000 mL | Freq: Once | INTRAVENOUS | Status: AC
  Administered 2022-12-16: 1000 mL via INTRAVENOUS

## 2022-12-15 MED ORDER — HYDROMORPHONE HCL 1 MG/ML IJ SOLN: 1.0000 mg | INTRAMUSCULAR | Status: DC | PRN

## 2022-12-15 MED ORDER — OXYCODONE HCL 5 MG PO TABS
15.0000 mg | ORAL_TABLET | ORAL | Status: DC | PRN
  Administered 2022-12-15 – 2022-12-16 (×5): 15 mg via ORAL
  Filled 2022-12-15 (×5): qty 3

## 2022-12-15 MED ORDER — OXYCODONE HCL 5 MG PO TABS: 10.0000 mg | ORAL_TABLET | ORAL | Status: DC | PRN

## 2022-12-15 NOTE — Progress Notes (Signed)
Physical Therapy Treatment Patient Details Name: Jim [name] MRN: [id number] DOB: 22-Jul-1956 Today's Date: 12/15/2022   History of Present Illness 67 y.o. male presented to [facility] 5/13 for R leg femoral to posterior tibial artery bypass for treatment of right lower extremity critical ischemia with rest pain.  PMH: HTN, PVD, DM, DJD, Tobacco use.    PT Comments    Pt received seated EOB, agreeable to therapy session with emphasis on transfer, gait and stair safety. Pt needing up to min guard for transfers/gait with RW support and with good carryover of sequencing with stairs, pt able to perform x5 reps of step in room with min guard assist. No buckling or loss of balance, but distance limited to room due to pt c/o moderate to severe RLE pain and headache, RN notified. Pt continues to benefit from PT services to progress toward functional mobility goals.    Recommendations for follow up therapy are one component of a multi-disciplinary discharge planning process, led by the attending physician.  Recommendations may be updated based on patient status, additional functional criteria and insurance authorization.  Follow Up Recommendations       Assistance Recommended at Discharge Frequent or constant Supervision/Assistance  Patient can return home with the following A little help with walking and/or transfers;A little help with bathing/dressing/bathroom;Assistance with cooking/housework;Assist for transportation;Help with stairs or ramp for entrance   Equipment Recommendations  None recommended by PT    Recommendations for Other Services       Precautions / Restrictions Precautions Precautions: None Restrictions Weight Bearing Restrictions: No     Mobility  Bed Mobility Overal bed mobility: Needs Assistance             General bed mobility comments: Pt rec'd sitting EOB after using urinal, left up in recliner at end of session    Transfers Overall transfer level: Needs  assistance Equipment used: Rolling walker (2 wheels), Straight cane Transfers: Sit to/from Stand Sit to Stand: Min guard           General transfer comment: STS from EOB and sitting to chair; Educated pt on extending RLE to avoid excessive pain when bending RLE when returning to sitting position. Pt stood from EOB with cane and agreeable to use RW for gait due to increased pain    Ambulation/Gait Ambulation/Gait assistance: Min guard Gait Distance (Feet): 25 Feet Assistive device: Rolling walker (2 wheels) Gait Pattern/deviations: Step-to pattern, Decreased dorsiflexion - right, Decreased step length - right, Antalgic       General Gait Details: heavy reliance on RW, cues for activity pacing/safety, step-to due to pain; pt self-limiting distance due to pain and had IV pain meds ~2hrs prior to session (pt had been unsure and thought it was 1 hour prior).   Stairs Stairs: Yes Stairs assistance: Min guard Stair Management: Two rails, Step to pattern, Forwards Number of Stairs: 5 General stair comments: single 7" step in room x5 reps, initial cues for pattern for decreased pain and pt with good carryover, he has had both knees replaced so has some recall of safe technique. No buckling   Wheelchair Mobility    Modified Rankin (Stroke Patients Only)       Balance Overall balance assessment: Needs assistance Sitting-balance support: Feet supported, No upper extremity supported Sitting balance-Leahy Scale: Good     Standing balance support: Bilateral upper extremity supported, During functional activity, Reliant on assistive device for balance Standing balance-Leahy Scale: Poor Standing balance comment: heavy reliance on RW due to  RLE pain                            Cognition Arousal/Alertness: Awake/alert Behavior During Therapy: WFL for tasks assessed/performed Overall Cognitive Status: Within Functional Limits for tasks assessed                                           Exercises Other Exercises Other Exercises: supine BLE AROM: ankle pumps x10 reps    General Comments General comments (skin integrity, edema, etc.): HR 64 bpm seated and SpO2 99% on RA      Pertinent Vitals/Pain Pain Assessment Pain Assessment: 0-10 Pain Score: 7  Pain Location: RLE, Groin, and low back Pain Descriptors / Indicators: Burning, Sharp, Grimacing Pain Intervention(s): Monitored during session, Limited activity within patient's tolerance, Premedicated before session, Repositioned, Ice applied, Patient requesting pain meds-RN notified    Home Living                          Prior Function            PT Goals (current goals can now be found in the care plan section) Acute Rehab PT Goals Patient Stated Goal: get back to gardening PT Goal Formulation: With patient/family Time For Goal Achievement: 12/28/22 Progress towards PT goals: Progressing toward goals    Frequency    Min 1X/week      PT Plan Current plan remains appropriate    Co-evaluation              AM-PAC PT "6 Clicks" Mobility   Outcome Measure  Help needed turning from your back to your side while in a flat bed without using bedrails?: None Help needed moving from lying on your back to sitting on the side of a flat bed without using bedrails?: A Little Help needed moving to and from a bed to a chair (including a wheelchair)?: A Little Help needed standing up from a chair using your arms (e.g., wheelchair or bedside chair)?: A Little Help needed to walk in hospital room?: A Little Help needed climbing 3-5 steps with a railing? : A Little 6 Click Score: 19    End of Session Equipment Utilized During Treatment: Gait belt Activity Tolerance: Patient limited by pain;Patient tolerated treatment well Patient left: in chair;with call bell/phone within reach;Other (comment) (RLE elevated, ice pack to RLE pt instructed on frequency) Nurse  Communication: Mobility status;Patient requests pain meds;Other (comment) (NT notified pt requesting ice water and diet ginger ale) PT Visit Diagnosis: Unsteadiness on feet (R26.81);Other abnormalities of gait and mobility (R26.89);Muscle weakness (generalized) (M62.81);Difficulty in walking, not elsewhere classified (R26.2);Pain Pain - Right/Left: Right Pain - part of body: Leg     Time: 1810-1826 PT Time Calculation (min) (ACUTE ONLY): 16 min  Charges:  $Gait Training: 8-22 mins                     Hani Patnode P., PTA Acute Rehabilitation Services Secure Chat Preferred 9a-5:30pm Office: [phone]    Dorathy Kinsman [facility] 12/15/2022, 6:46 PM

## 2022-12-15 NOTE — Progress Notes (Signed)
  Progress Note    12/15/2022 6:39 AM 2 Days Post-Op   Afebrile HR 60's-70's NSR 100's-130's systolic 93% RA  Vitals:   12/14/22 2300 12/15/22 0300  BP: (!) 107/51 108/61  Pulse: 68 64  Resp: 15 20  Temp: 97.7 F (36.5 C) 98 F (36.7 C)  SpO2: 95% 96%    Physical Exam: Pt seen by Dr. Karin Lieu  CBC    Component Value Date/Time   WBC 11.3 (H) 12/14/2022 0151   RBC 4.54 12/14/2022 0151   HGB 13.9 12/14/2022 0151   HGB 15.9 07/15/2022 1349   HCT 43.4 12/14/2022 0151   HCT 48.2 07/15/2022 1349   PLT 100 (L) 12/14/2022 0151   PLT 112 (L) 07/15/2022 1349   MCV 95.6 12/14/2022 0151   MCV 95 07/15/2022 1349   MCH 30.6 12/14/2022 0151   MCHC 32.0 12/14/2022 0151   RDW 13.6 12/14/2022 0151   RDW 13.9 07/15/2022 1349   LYMPHSABS 1.7 07/15/2022 1349   MONOABS 0.7 07/07/2022 0926   EOSABS 0.1 07/15/2022 1349   BASOSABS 0.0 07/15/2022 1349    BMET    Component Value Date/Time   NA 132 (L) 12/14/2022 0151   NA 144 07/15/2022 1349   K 4.0 12/14/2022 0151   CL 103 12/14/2022 0151   CO2 22 12/14/2022 0151   GLUCOSE 301 (H) 12/14/2022 0151   BUN 17 12/14/2022 0151   BUN 14 07/15/2022 1349   CREATININE 0.99 12/14/2022 0151   CREATININE 0.93 08/13/2014 0804   CALCIUM 8.1 (L) 12/14/2022 0151   GFRNONAA >60 12/14/2022 0151   GFRAA 77 05/22/2019 0942    INR    Component Value Date/Time   INR 1.0 12/13/2022 0934     Intake/Output Summary (Last 24 hours) at 12/15/2022 1610 Last data filed at 12/15/2022 0400 Gross per 24 hour  Intake --  Output 3200 ml  Net -3200 ml      Assessment/Plan:  67 y.o. male is s/p:  Right leg Femoral to posterior tibial artery bypass using ipsilateral, non-reversed greater saphenous vein   2 Days Post-Op   -DM with hyperglycemia-improved with addition of Semglee 8 units daily PT and OT recommending HHPT/OT-face to face and TOC consult placed. -DVT prophylaxis:  sq heparin   Doreatha Massed, PA-C [facility] [phone] 12/15/2022 6:39 AM

## 2022-12-15 NOTE — Consult Note (Signed)
[facility] Customer service manager [facility]) Accountable Care Organization (ACO) [facility] Liaison Note  12/15/2022  Jim [name] 1955/08/23 [id number]  Location: [facility] RN Hospital Liaison met patient at bedside at [facility].  Insurance: [employer]   Jim [name] is a 67 y.o. male who is a Primary Care Patient of Luking, Jonna Coup, MD. The patient was screened for  readmission hospitalization with noted medium risk score for unplanned readmission risk with 2 IP in 6 months.  The patient was assessed for potential [facility]) Care Management service needs for post hospital transition for care coordination. Review of patient's electronic medical record reveals patient was admitted with Critical limb ischemia of the right lower extremity. ....Marland KitchenMarland KitchenPatient was given an appointment reminder card and 24 hour Nurse Advice Line magnet.   Plan: [facility] Liaison will continue to follow progress and disposition to asess for post hospital community care coordination/management needs.  Referral request for community care coordination: anticipate [facility] Transitions of Care Team follow up.   [facility] Care Management/Population Health does not replace or interfere with any arrangements made by the Inpatient Transition of Care team.   For questions contact:   Elliot Cousin, RN, BSN [facility] Liaison [place]   [facility]  Population Health Office Hours MTWF 8:00 am to 6 pm off on Thursday [phone] mobile [phone] [Office toll free line][facility] Office Hours are M-F 8:30 - 5 pm 24 hour nurse advise line [phone] Conceirge  [email or web address]

## 2022-12-15 NOTE — Progress Notes (Signed)
  Progress Note    12/15/2022 7:29 AM 2 Days Post-Op  Subjective:  Incisional pain right leg   Afebrile HR 50's-70's NSR  Vitals:   12/14/22 2300 12/15/22 0300  BP: (!) 107/51 108/61  Pulse: 68 64  Resp: 15 20  Temp: 97.7 F (36.5 C) 98 F (36.7 C)  SpO2: 95% 96%    Physical Exam: General:  no distress Cardiac:  regular Lungs:  non labored Incisions:  RLE with ace wrap in place; right groin clean with mild ecchymosis Extremities:  PT palpable, calf is soft Abdomen:  soft  CBC    Component Value Date/Time   WBC 11.3 (H) 12/14/2022 0151   RBC 4.54 12/14/2022 0151   HGB 13.9 12/14/2022 0151   HGB 15.9 07/15/2022 1349   HCT 43.4 12/14/2022 0151   HCT 48.2 07/15/2022 1349   PLT 100 (L) 12/14/2022 0151   PLT 112 (L) 07/15/2022 1349   MCV 95.6 12/14/2022 0151   MCV 95 07/15/2022 1349   MCH 30.6 12/14/2022 0151   MCHC 32.0 12/14/2022 0151   RDW 13.6 12/14/2022 0151   RDW 13.9 07/15/2022 1349   LYMPHSABS 1.7 07/15/2022 1349   MONOABS 0.7 07/07/2022 0926   EOSABS 0.1 07/15/2022 1349   BASOSABS 0.0 07/15/2022 1349    BMET    Component Value Date/Time   NA 132 (L) 12/14/2022 0151   NA 144 07/15/2022 1349   K 4.0 12/14/2022 0151   CL 103 12/14/2022 0151   CO2 22 12/14/2022 0151   GLUCOSE 301 (H) 12/14/2022 0151   BUN 17 12/14/2022 0151   BUN 14 07/15/2022 1349   CREATININE 0.99 12/14/2022 0151   CREATININE 0.93 08/13/2014 0804   CALCIUM 8.1 (L) 12/14/2022 0151   GFRNONAA >60 12/14/2022 0151   GFRAA 77 05/22/2019 0942    INR    Component Value Date/Time   INR 1.0 12/13/2022 0934     Intake/Output Summary (Last 24 hours) at 12/15/2022 0729 Last data filed at 12/15/2022 0400 Gross per 24 hour  Intake --  Output 3200 ml  Net -3200 ml       Assessment/Plan:  67 y.o. male is s/p:  Right leg Femoral to posterior tibial artery bypass using ipsilateral, non-reversed greater saphenous vein.   2 Days Post-Op   -PT palpable -Discussed groin wound  care with pt to protect incision with dry gauze - needs at all times. High risk breakdown due to habitus  -Hyperglycemia improved  -Will need to mobilize out of bed - PT and nursing -Pt takes pain meds at baseline, will increase current dose -Discussed importance of smoking cessation.  He says he quit for 8 years and started back a year ago. -DVT prophylaxis:  sq heparin   [name] [facility] [phone] 12/15/2022 7:29 AM

## 2022-12-15 NOTE — TOC Initial Note (Signed)
Transition of Care [facility]) - Initial/Assessment Note    Patient Details  Name: Jim [name] MRN: [id number] Date of Birth: 01/27/1956  Transition of Care [facility]) CM/SW Contact:    Durenda Guthrie, RN Phone Number: 12/15/2022, 4:21 PM  Clinical Narrative:                 Patient is a 67 y.o. male s/p R leg femoral to posterior tibial artery bypass for treatment of right lower extremity critical ischemia with rest pain.   Case manager spoke with patient concerning recommendation for Home Health therapy. Patient has no preference for [facility] agency, agreeable with referral being sent to [facility], Lorenza Chick. Patient states his wife will assist at home and he has a cane.   Expected Discharge Plan: Home w Home Health Services Barriers to Discharge: Continued Medical Work up   Patient Goals and CMS Choice Patient states their goals for this hospitalization and ongoing recovery are:: get better   Choice offered to / list presented to : Patient      Expected Discharge Plan and Services   Discharge Planning Services: CM Consult   Living arrangements for the past 2 months: Single Family Home                 DME Arranged: N/A         HH Arranged: PT, OT HH Agency: [facility] Date [facility] Agency Contacted: 12/15/22 Time HH Agency Contacted: 1620 Representative spoke with at [facility] Agency: Lorenza Chick  Prior Living Arrangements/Services Living arrangements for the past 2 months: Single Family Home Lives with:: Spouse Patient language and need for interpreter reviewed:: Yes Do you feel safe going back to the place where you live?: Yes      Need for Family Participation in Patient Care: Yes (Comment) Care giver support system in place?: Yes (comment) Current home services: DME (cane) Criminal Activity/Legal Involvement Pertinent to Current Situation/Hospitalization: No - Comment as needed  Activities of Daily Living Home Assistive Devices/Equipment: CBG Meter, Cane  (specify quad or straight), Eyeglasses ADL Screening (condition at time of admission) Patient's cognitive ability adequate to safely complete daily activities?: Yes Is the patient 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) Does the patient have difficulty seeing, even when wearing glasses/contacts?: No Does the patient have difficulty concentrating, remembering, or making decisions?: No Patient able to express need for assistance with ADLs?: Yes Does the patient have difficulty dressing or bathing?: No Independently performs ADLs?: Yes (appropriate for developmental age) Does the patient have difficulty walking or climbing stairs?: No Weakness of Legs: Both Weakness of Arms/Hands: None  Permission Sought/Granted Permission sought to share information with : Case Manager       Permission granted to share info w AGENCY: [facility]        Emotional Assessment   Attitude/Demeanor/Rapport: Gracious   Orientation: : Oriented to Self, Oriented to Place, Oriented to  Time, Oriented to Situation Alcohol / Substance Use: Not Applicable Psych Involvement: No (comment)  Admission diagnosis:  Critical limb ischemia of right lower extremity (HCC) [I70.221] Patient Active Problem List   Diagnosis Date Noted   Critical limb ischemia of right lower extremity (HCC) 12/13/2022   Secondary esophageal 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) 08/06/2022   Thrombocytopenia (HCC) 08/06/2022   Abdominal pain, epigastric 07/08/2022   Odynophagia 07/08/2022   Esophageal dysphagia 07/08/2022   Cirrhosis of liver without ascites (HCC) 07/08/2022   Abdominal pain 07/07/2022   Trochanteric bursitis of both hips  07/31/2021   Right shoulder pain 12/10/2020   Spinal stenosis of lumbar region with neurogenic claudication 12/10/2020   Hyperlipidemia associated with type 2 diabetes mellitus (HCC) 10/21/2020   Arthropathy of lumbar facet joint 06/23/2020   PVD (peripheral vascular disease) with claudication (HCC)  05/21/2018   Type 2 diabetes mellitus with diabetic neuropathy, unspecified (HCC) 01/11/2018   Major depression in partial remission (HCC) 07/07/2016   Insomnia 04/13/2016   Primary localized osteoarthritis of right knee    Chronic pain syndrome 12/10/2014   HTN (hypertension), benign 08/15/2013   Hyperglycemia 08/15/2013   Depression    Gastric ulcer    Arthritis    Bursitis of hip    DJD (degenerative joint disease) of knee    Ex-smoker    Back pain    PCP:  Babs Sciara, MD Pharmacy:   [facility] [phone] - [place], [place] - [address] AT [facility] [address] [facility] [address] [place] [address] Phone: [phone] Fax: [phone]     Social Determinants of Health (SDOH) Social History: SDOH Screenings   Food Insecurity: No Food Insecurity (12/14/2022)  Recent Concern: Food Insecurity - Food Insecurity Present (11/01/2022)  Housing: Low Risk  (12/14/2022)  Transportation Needs: No Transportation Needs (12/14/2022)  Utilities: Not At Risk (12/14/2022)  Alcohol Screen: Low Risk  (05/31/2022)  Depression (PHQ2-9): Low Risk  (07/15/2022)  Financial Resource Strain: Patient Declined (11/01/2022)  Physical Activity: Inactive (11/01/2022)  Social Connections: Moderately Isolated (11/01/2022)  Stress: No Stress Concern Present (11/01/2022)  Tobacco Use: High Risk (12/14/2022)   SDOH Interventions:     Readmission Risk Interventions     No data to display

## 2022-12-16 LAB — CBC
HCT: 38.8 % — ABNORMAL LOW (ref 39.0–52.0)
Hemoglobin: 12.7 g/dL — ABNORMAL LOW (ref 13.0–17.0)
MCH: 31.8 pg (ref 26.0–34.0)
MCHC: 32.7 g/dL (ref 30.0–36.0)
MCV: 97.2 fL (ref 80.0–100.0)
Platelets: 88 10*3/uL — ABNORMAL LOW (ref 150–400)
RBC: 3.99 MIL/uL — ABNORMAL LOW (ref 4.22–5.81)
RDW: 13.7 % (ref 11.5–15.5)
WBC: 7.1 10*3/uL (ref 4.0–10.5)
nRBC: 0 % (ref 0.0–0.2)

## 2022-12-16 LAB — GLUCOSE, CAPILLARY
Glucose-Capillary: 162 mg/dL — ABNORMAL HIGH (ref 70–99)
Glucose-Capillary: 142 mg/dL — ABNORMAL HIGH (ref 70–99)
Glucose-Capillary: 120 mg/dL — ABNORMAL HIGH (ref 70–99)
Glucose-Capillary: 181 mg/dL — ABNORMAL HIGH (ref 70–99)

## 2022-12-16 MED ORDER — OXYCODONE HCL 5 MG PO TABS
20.0000 mg | ORAL_TABLET | ORAL | Status: DC | PRN
  Administered 2022-12-16 – 2022-12-18 (×10): 20 mg via ORAL
  Filled 2022-12-16 (×11): qty 4

## 2022-12-16 MED ORDER — HYDROMORPHONE HCL 1 MG/ML IJ SOLN
1.0000 mg | INTRAMUSCULAR | Status: DC | PRN
  Administered 2022-12-16 – 2022-12-17 (×3): 1 mg via INTRAVENOUS
  Filled 2022-12-16 (×3): qty 1

## 2022-12-16 MED ORDER — OXYCODONE HCL 5 MG PO TABS: 15.0000 mg | ORAL_TABLET | ORAL | Status: DC | PRN

## 2022-12-16 NOTE — Progress Notes (Signed)
Physical Therapy Treatment Patient Details Name: Jim [name] MRN: [id number] DOB: 1955-10-26 Today's Date: 12/16/2022   History of Present Illness 67 y.o. male presented to [facility] 5/13 for R leg femoral to posterior tibial artery bypass for treatment of right lower extremity critical ischemia with rest pain.  PMH: HTN, PVD, DM, DJD, Tobacco use.    PT Comments     Pt received in supine, agreeable to therapy session with emphasis on supine/seated/standing RLE exercises, gait progression and stair safety. Pt with good recall of stair sequencing from previous session but defers to perform today due to pain after gait trial. Pt able to increase to longer household distance gait trial with RW and encouragement after premedication. Pt continues to benefit from PT services to progress toward functional mobility goals.    Recommendations for follow up therapy are one component of a multi-disciplinary discharge planning process, led by the attending physician.  Recommendations may be updated based on patient status, additional functional criteria and insurance authorization.  Follow Up Recommendations       Assistance Recommended at Discharge Frequent or constant Supervision/Assistance  Patient can return home with the following A little help with walking and/or transfers;A little help with bathing/dressing/bathroom;Assistance with cooking/housework;Assist for transportation;Help with stairs or ramp for entrance   Equipment Recommendations  None recommended by PT    Recommendations for Other Services       Precautions / Restrictions Precautions Precautions: None Precaution Comments: watch BP (TED hose in room if needed) Restrictions Weight Bearing Restrictions: No     Mobility  Bed Mobility Overal bed mobility: Needs Assistance Bed Mobility: Supine to Sit     Supine to sit: Supervision     General bed mobility comments: use of bed features, PTA assist with lines     Transfers Overall transfer level: Needs assistance Equipment used: Rolling walker (2 wheels) Transfers: Sit to/from Stand Sit to Stand: Min guard           General transfer comment: Pt demonstrated good carryover of extending leg when returning to sitting position to decrease pain associated with RLE bending; from EOB and to chair heights.    Ambulation/Gait Ambulation/Gait assistance: Min guard, Supervision Gait Distance (Feet): 125 Feet Assistive device: Rolling walker (2 wheels) Gait Pattern/deviations: Step-to pattern, Decreased dorsiflexion - right, Decreased step length - right, Antalgic       General Gait Details: heavy reliance on RW, cues for activity pacing/safety, step-to due to pain; no seated break needed but pt defers stairs due to increased pain despite premedication   Stairs             Wheelchair Mobility    Modified Rankin (Stroke Patients Only)       Balance Overall balance assessment: Needs assistance Sitting-balance support: Feet supported, No upper extremity supported Sitting balance-Leahy Scale: Good     Standing balance support: Bilateral upper extremity supported, During functional activity, Reliant on assistive device for balance Standing balance-Leahy Scale: Poor Standing balance comment: heavy reliance on RW due to RLE pain                            Cognition Arousal/Alertness: Awake/alert Behavior During Therapy: WFL for tasks assessed/performed Overall Cognitive Status: Within Functional Limits for tasks assessed  Exercises Other Exercises Other Exercises: Standing RLE AROM ankle pumps x5 reps Other Exercises: Standing RLE AROM knee flex/ext x5 reps Other Exercises: supine RLE AROM: ankle pumps x10 reps    General Comments General comments (skin integrity, edema, etc.): HR WFL, SpO2 WFL on RA; BP 117/96 (104) standing; BP 102/61 (74) reclined in chair  post-exertion HR 66 bpm seated      Pertinent Vitals/Pain Pain Assessment Pain Assessment: 0-10 Pain Score: 8  Pain Location: RLE with weight bearing after IV premedication <1 hour prior Pain Descriptors / Indicators: Burning, Tightness, Discomfort, Grimacing Pain Intervention(s): Limited activity within patient's tolerance, Monitored during session, Premedicated before session, Repositioned, Ice applied    Home Living                          Prior Function            PT Goals (current goals can now be found in the care plan section) Acute Rehab PT Goals Patient Stated Goal: get back to gardening, less pain PT Goal Formulation: With patient/family Time For Goal Achievement: 12/28/22 Progress towards PT goals: Progressing toward goals    Frequency    Min 1X/week      PT Plan Current plan remains appropriate    Co-evaluation              AM-PAC PT "6 Clicks" Mobility   Outcome Measure  Help needed turning from your back to your side while in a flat bed without using bedrails?: None Help needed moving from lying on your back to sitting on the side of a flat bed without using bedrails?: A Little Help needed moving to and from a bed to a chair (including a wheelchair)?: A Little Help needed standing up from a chair using your arms (e.g., wheelchair or bedside chair)?: A Little Help needed to walk in hospital room?: A Little Help needed climbing 3-5 steps with a railing? : A Little 6 Click Score: 19    End of Session Equipment Utilized During Treatment: Gait belt Activity Tolerance: Patient tolerated treatment well;Patient limited by pain Patient left: in chair;with call bell/phone within reach;Other (comment) (RLE elevated, ice pack to RLE pt instructed on frequency) Nurse Communication: Mobility status PT Visit Diagnosis: Unsteadiness on feet (R26.81);Other abnormalities of gait and mobility (R26.89);Muscle weakness (generalized) (M62.81);Difficulty  in walking, not elsewhere classified (R26.2);Pain Pain - Right/Left: Right Pain - part of body: Leg     Time: 1610-9604 PT Time Calculation (min) (ACUTE ONLY): 23 min  Charges:  $Gait Training: 8-22 mins $Therapeutic Exercise: 8-22 mins                     Jim Yahr P., PTA Acute Rehabilitation Services Secure Chat Preferred 9a-5:30pm Office: [phone]    Dorathy Kinsman [facility] 12/16/2022, 7:00 PM

## 2022-12-16 NOTE — Progress Notes (Signed)
Occupational Therapy Treatment Patient Details Name: Jim [name] MRN: [id number] DOB: [birth year] Today's Date: 12/16/2022   History of present illness 67 y.o. male presented to [facility] 5/13 for R leg femoral to posterior tibial artery bypass for treatment of right lower extremity critical ischemia with rest pain.  PMH: HTN, PVD, DM, DJD, Tobacco use.   OT comments  Pt continuing to progress with pain tolerance during functional ambulation. Pt ambulating ~38ft with mild increase in pain to RLE, pt demonstrating good carryover for RLE gait pattern with RW use and compensatory strategies. Encouraged pt to complete AROM knee flex and int/ext rotation, Pt reported the AROM "felt good." OT to continue to progress pt as able, next session may attempt tub transfer with tub bench. DC plans appropriate for HH at this time.   Recommendations for follow up therapy are one component of a multi-disciplinary discharge planning process, led by the attending physician.  Recommendations may be updated based on patient status, additional functional criteria and insurance authorization.    Assistance Recommended at Discharge Intermittent Supervision/Assistance  Patient can return home with the following  A lot of help with bathing/dressing/bathroom;Assistance with cooking/housework;Assist for transportation;Help with stairs or ramp for entrance;A little help with walking and/or transfers   Equipment Recommendations  Tub/shower bench    Recommendations for Other Services      Precautions / Restrictions Precautions Precautions: None Restrictions Weight Bearing Restrictions: No       Mobility Bed Mobility               General bed mobility comments: pt rec'd and left sitting in recliner    Transfers Overall transfer level: Needs assistance Equipment used: Rolling walker (2 wheels) Transfers: Sit to/from Stand Sit to Stand: Min guard           General transfer comment: Pt demonstrated good  carryover of extending leg when returning to sitting position to decrease pain associated with RLE bending     Balance       Sitting balance - Comments: Pt in recliner, unable to fully assess   Standing balance support: Bilateral upper extremity supported, During functional activity, Reliant on assistive device for balance Standing balance-Leahy Scale: Poor                             ADL either performed or assessed with clinical judgement   ADL                                       Functional mobility during ADLs: Min guard;Rolling walker (2 wheels)      Extremity/Trunk Assessment              Vision       Perception     Praxis      Cognition Arousal/Alertness: Awake/alert Behavior During Therapy: WFL for tasks assessed/performed Overall Cognitive Status: Within Functional Limits for tasks assessed                                          Exercises Other Exercises Other Exercises: Standing AROM knee int/ext rotation x5 reps Other Exercises: Standing AROM knee flex x5 reps    Shoulder Instructions       General Comments HR 67 and Sp02 stable  on RA. BP at 96/49 at end of 02 session, earlier at 9am his bp was 84/51. Pt denies any c/o dizziness and lightheadedness. Spoke to NT about pt BP, per NT this has been ongoing and could be attributed to medication    Pertinent Vitals/ Pain       Pain Assessment Pain Assessment: 0-10 Pain Score: 7  Pain Location: RLE following end of OT session, began at 5/10 Pain Descriptors / Indicators: Burning, Tightness Pain Intervention(s): Limited activity within patient's tolerance, Monitored during session, Premedicated before session (per NT pain meds were administered earlier today before session)  Home Living                                          Prior Functioning/Environment              Frequency  Min 2X/week        Progress Toward  Goals  OT Goals(current goals can now be found in the care plan section)  Progress towards OT goals: Progressing toward goals  Acute Rehab OT Goals Patient Stated Goal: To go home OT Goal Formulation: With patient Time For Goal Achievement: 12/28/22 Potential to Achieve Goals: Good  Plan Discharge plan remains appropriate;Frequency remains appropriate    Co-evaluation                 AM-PAC OT "6 Clicks" Daily Activity     Outcome Measure   Help from another person eating meals?: None Help from another person taking care of personal grooming?: A Little Help from another person toileting, which includes using toliet, bedpan, or urinal?: A Little Help from another person bathing (including washing, rinsing, drying)?: A Little Help from another person to put on and taking off regular upper body clothing?: A Little Help from another person to put on and taking off regular lower body clothing?: A Lot 6 Click Score: 18    End of Session Equipment Utilized During Treatment: Gait belt;Rolling walker (2 wheels)  OT Visit Diagnosis: Unsteadiness on feet (R26.81);Other abnormalities of gait and mobility (R26.89);Pain Pain - Right/Left: Right Pain - part of body: Hip;Knee;Leg   Activity Tolerance Patient tolerated treatment well   Patient Left with call bell/phone within reach;in chair;with family/visitor present   Nurse Communication Mobility status        Time: 1610-9604 OT Time Calculation (min): 14 min  Charges: OT General Charges $OT Visit: 1 Visit OT Treatments $Therapeutic Activity: 8-22 mins  12/16/2022  AB, OTR/L  Acute Rehabilitation Services  Office: [phone]   Tristan Schroeder 12/16/2022, 10:01 AM

## 2022-12-16 NOTE — Plan of Care (Signed)
  Problem: Health Behavior/Discharge Planning: Goal: Ability to manage health-related needs will improve Outcome: Not Progressing   

## 2022-12-16 NOTE — Progress Notes (Addendum)
  Progress Note    12/16/2022 6:35 AM 3 Days Post-Op  Subjective:  says he feels a little bit better.  Leg feels better without ace wrap.  Walked a little yesterday.  No dizziness or lightheadedness.   Afebrile HR 50's-70's NSR 80's-130's systolic 95% RA  Vitals:   12/15/22 2325 12/16/22 0309  BP: (!) 83/46 (!) 89/49  Pulse: (!) 58   Resp: 15   Temp: 98.3 F (36.8 C)   SpO2: 95%     Physical Exam: General:  no distress Cardiac:  regular Lungs:  non labored Incisions:  all incisions look good Extremities:  + right PT doppler signal.  Right calf a little sore but calf and anterior compartments are very soft.   Abdomen:  soft  CBC    Component Value Date/Time   WBC 11.3 (H) 12/14/2022 0151   RBC 4.54 12/14/2022 0151   HGB 13.9 12/14/2022 0151   HGB 15.9 07/15/2022 1349   HCT 43.4 12/14/2022 0151   HCT 48.2 07/15/2022 1349   PLT 100 (L) 12/14/2022 0151   PLT 112 (L) 07/15/2022 1349   MCV 95.6 12/14/2022 0151   MCV 95 07/15/2022 1349   MCH 30.6 12/14/2022 0151   MCHC 32.0 12/14/2022 0151   RDW 13.6 12/14/2022 0151   RDW 13.9 07/15/2022 1349   LYMPHSABS 1.7 07/15/2022 1349   MONOABS 0.7 07/07/2022 0926   EOSABS 0.1 07/15/2022 1349   BASOSABS 0.0 07/15/2022 1349    BMET    Component Value Date/Time   NA 132 (L) 12/14/2022 0151   NA 144 07/15/2022 1349   K 4.0 12/14/2022 0151   CL 103 12/14/2022 0151   CO2 22 12/14/2022 0151   GLUCOSE 301 (H) 12/14/2022 0151   BUN 17 12/14/2022 0151   BUN 14 07/15/2022 1349   CREATININE 0.99 12/14/2022 0151   CREATININE 0.93 08/13/2014 0804   CALCIUM 8.1 (L) 12/14/2022 0151   GFRNONAA >60 12/14/2022 0151   GFRAA 77 05/22/2019 0942    INR    Component Value Date/Time   INR 1.0 12/13/2022 0934     Intake/Output Summary (Last 24 hours) at 12/16/2022 1610 Last data filed at 12/16/2022 9604 Gross per 24 hour  Intake 240 ml  Output 2525 ml  Net -2285 ml      Assessment/Plan:  67 y.o. male is s/p:  Right leg  Femoral to posterior tibial artery bypass using ipsilateral, non-reversed greater saphenous vein   3 Days Post-Op   -pt doing well this morning with +PT doppler signal right.   -he has had some soft blood pressures-will check CBC today -continue to mobilize -pain is an issue-pt on oxycodone 20mg  4-5x/day prior to admit.  He has soft pressures currently, so will hold on restarting this as of now.  CBC ordered. -glucose improved -DVT prophylaxis:  sq heparin   Doreatha Massed, PA-C [facility] [phone] 12/16/2022 6:35 AM  VASCULAR STAFF ADDENDUM: I have independently interviewed and examined the patient. I agree with the above.  Possible home tomorrow. Restart home meds.   Fara Olden, MD [facility] Phone Number: [phone] 12/16/2022 10:35 AM

## 2022-12-16 NOTE — Progress Notes (Signed)
Bolus completed. Patient's bp still low. Patient asking for pain meds but explained to patient that d/t his bp being low, pain meds could not be given until b/p goes back up.   Will continue to monitor

## 2022-12-16 NOTE — Care Management Important Message (Signed)
Important Message  Patient Details  Name: Jim [name] MRN: [id number] Date of Birth: 06/24/1956   Medicare Important Message Given:  Yes     Renie Ora 12/16/2022, 8:40 AM

## 2022-12-16 NOTE — Progress Notes (Signed)
Notified Dr. Myra Gianotti for patient's low bp. Patient asking for more pain meds after 15 oxy given at 2300.   Orders given toradol q6 PRN fro 4 doses. 1L bolus.  Will continue to monitor

## 2022-12-17 LAB — GLUCOSE, CAPILLARY
Glucose-Capillary: 184 mg/dL — ABNORMAL HIGH (ref 70–99)
Glucose-Capillary: 128 mg/dL — ABNORMAL HIGH (ref 70–99)
Glucose-Capillary: 159 mg/dL — ABNORMAL HIGH (ref 70–99)
Glucose-Capillary: 94 mg/dL (ref 70–99)

## 2022-12-17 MED ORDER — METFORMIN HCL 500 MG PO TABS
1000.0000 mg | ORAL_TABLET | Freq: Two times a day (BID) | ORAL | Status: DC
  Administered 2022-12-17 – 2022-12-18 (×2): 1000 mg via ORAL
  Filled 2022-12-17 (×2): qty 2

## 2022-12-17 NOTE — Plan of Care (Signed)
  Problem: Activity: Goal: Ability to return to baseline activity level will improve Outcome: Progressing   Problem: Fluid Volume: Goal: Ability to maintain a balanced intake and output will improve Outcome: Progressing   Problem: Nutritional: Goal: Maintenance of adequate nutrition will improve Outcome: Progressing   

## 2022-12-17 NOTE — Progress Notes (Signed)
  Progress Note    12/17/2022 6:43 AM 4 Days Post-Op  Subjective:  denies pain in his foot; has incisional pain.  Says the stocking feels fine.   Afebrile HR 50's-80's NSR 90's-120's systolic 96% RA  Vitals:   12/17/22 0302 12/17/22 0500  BP: (!) 106/51   Pulse: 63   Resp: 20   Temp:  97.7 F (36.5 C)  SpO2: 96%     Physical Exam: General:  sitting on side of bed-no distress Lungs:  non labored Incisions:  right groin incision looks fine (has dry gauze in place) Extremities:  right foot is warm Abdomen:  soft  CBC    Component Value Date/Time   WBC 7.1 12/16/2022 0837   RBC 3.99 (L) 12/16/2022 0837   HGB 12.7 (L) 12/16/2022 0837   HGB 15.9 07/15/2022 1349   HCT 38.8 (L) 12/16/2022 0837   HCT 48.2 07/15/2022 1349   PLT 88 (L) 12/16/2022 0837   PLT 112 (L) 07/15/2022 1349   MCV 97.2 12/16/2022 0837   MCV 95 07/15/2022 1349   MCH 31.8 12/16/2022 0837   MCHC 32.7 12/16/2022 0837   RDW 13.7 12/16/2022 0837   RDW 13.9 07/15/2022 1349   LYMPHSABS 1.7 07/15/2022 1349   MONOABS 0.7 07/07/2022 0926   EOSABS 0.1 07/15/2022 1349   BASOSABS 0.0 07/15/2022 1349    BMET    Component Value Date/Time   NA 132 (L) 12/14/2022 0151   NA 144 07/15/2022 1349   K 4.0 12/14/2022 0151   CL 103 12/14/2022 0151   CO2 22 12/14/2022 0151   GLUCOSE 301 (H) 12/14/2022 0151   BUN 17 12/14/2022 0151   BUN 14 07/15/2022 1349   CREATININE 0.99 12/14/2022 0151   CREATININE 0.93 08/13/2014 0804   CALCIUM 8.1 (L) 12/14/2022 0151   GFRNONAA >60 12/14/2022 0151   GFRAA 77 05/22/2019 0942    INR    Component Value Date/Time   INR 1.0 12/13/2022 0934     Intake/Output Summary (Last 24 hours) at 12/17/2022 0643 Last data filed at 12/17/2022 0244 Gross per 24 hour  Intake 2207.44 ml  Output 975 ml  Net 1232.44 ml      Assessment/Plan:  67 y.o. male is s/p:  Right leg Femoral to posterior tibial artery bypass using ipsilateral, non-reversed greater saphenous vein   4 Days  Post-Op   -pt looks good this morning.  He has walked.  He still required some dilaudid this am.  We discussed that he cannot go home with this and he knows that I am discontinuing this.  He would like one more day and go home tomorrow. Encouraged him to continue to increase his mobilization today -acute blood loss anemia-hgb yesterday 12.7 -thrombocytopenia-platelets 88k < 100k < 113k (pt had thrombocytopenia in December 2023) appears to be baseline -DVT prophylaxis:  sq heparin   Doreatha Massed, PA-C [facility] [phone] 12/17/2022 6:43 AM

## 2022-12-17 NOTE — Plan of Care (Signed)
  Problem: Health Behavior/Discharge Planning: Goal: Ability to manage health-related needs will improve Outcome: Not Progressing   

## 2022-12-17 NOTE — Progress Notes (Signed)
Physical Therapy Treatment Patient Details Name: Jim [name] MRN: [id number] DOB: 14-Aug-1955 Today's Date: 12/17/2022   History of Present Illness 67 y.o. male presented to [facility] 5/13 for R leg femoral to posterior tibial artery bypass for treatment of right lower extremity critical ischemia with rest pain.  PMH: HTN, PVD, DM, DJD, Tobacco use.    PT Comments    Pt received in supine, agreeable to therapy session and with good participation and tolerance for gait and stair training with RW. Pt needing up to Supervision for improved body mechanics/RW management and was also able to perform 4 steps in hallway with single rail and no LOB/buckling to simulate home entry. Emphasis on activity pacing, self-monitoring for pain/symptoms of soft BP. With bil thigh high TED hose donned, BP stable sitting and standing, RN notified. Pt continues to benefit from PT services to progress toward functional mobility goals.   Recommendations for follow up therapy are one component of a multi-disciplinary discharge planning process, led by the attending physician.  Recommendations may be updated based on patient status, additional functional criteria and insurance authorization.  Follow Up Recommendations       Assistance Recommended at Discharge Frequent or constant Supervision/Assistance  Patient can return home with the following A little help with walking and/or transfers;A little help with bathing/dressing/bathroom;Assistance with cooking/housework;Assist for transportation;Help with stairs or ramp for entrance   Equipment Recommendations  None recommended by PT    Recommendations for Other Services       Precautions / Restrictions Precautions Precautions: None Precaution Comments: watch BP (use TED hose for standing/OOB) Restrictions Weight Bearing Restrictions: No     Mobility  Bed Mobility Overal bed mobility: Needs Assistance Bed Mobility: Supine to Sit     Supine to sit: Modified  independent (Device/Increase time)     General bed mobility comments: use of bed features/rail    Transfers Overall transfer level: Needs assistance Equipment used: Rolling walker (2 wheels) Transfers: Sit to/from Stand Sit to Stand: Modified independent (Device/Increase time)           General transfer comment: Pt demonstrated good recall of extending leg to decrease pain associated with RLE bending with transfers to/from EOB and chair, no cues needed    Ambulation/Gait Ambulation/Gait assistance: Supervision Gait Distance (Feet): 95 Feet Assistive device: Rolling walker (2 wheels) Gait Pattern/deviations: Step-to pattern, Decreased dorsiflexion - right, Decreased step length - right, Antalgic Gait velocity: grossly <0.4 m/s     General Gait Details: Mild to moderate reliance on RW, cues for activity pacing/safety, step-to due to pain; no seated break needed, distance limited after pt returned from stair trial pt defers longer hallway distances   Stairs Stairs: Yes Stairs assistance: Supervision Stair Management: One rail Left, Step to pattern, Forwards Number of Stairs: 4 General stair comments: in stairwell next to his room pt using single rail at times with both UE supported by rail, no cues needed for safe pattern, min cues for activity pacing and Supervision for VS monitoring/lines   Wheelchair Mobility    Modified Rankin (Stroke Patients Only)       Balance Overall balance assessment: Needs assistance Sitting-balance support: Feet supported, No upper extremity supported Sitting balance-Leahy Scale: Good     Standing balance support: Bilateral upper extremity supported, During functional activity, Reliant on assistive device for balance Standing balance-Leahy Scale: Poor Standing balance comment: heavy reliance on RW due to RLE pain  Cognition Arousal/Alertness: Awake/alert Behavior During Therapy: WFL for tasks  assessed/performed Overall Cognitive Status: Within Functional Limits for tasks assessed                                          Exercises Other Exercises Other Exercises: Seated/standing RLE AROM ankle pumps x10 reps Other Exercises: Standing RLE AROM knee flex/ext x10 reps Other Exercises: seated RLE AROM: hip flexion, LAQ x10 reps ea    General Comments        Pertinent Vitals/Pain Pain Assessment Pain Assessment: 0-10 Pain Score: 7  Pain Location: RLE with weight bearing, pt had PO meds ~3 hours prior (not yet due for 1 more hour at time of session) Pain Descriptors / Indicators: Burning, Tightness, Discomfort, Grimacing Pain Intervention(s): Limited activity within patient's tolerance, Monitored during session, Repositioned, Ice applied     PT Goals (current goals can now be found in the care plan section) Acute Rehab PT Goals Patient Stated Goal: get back to gardening, less pain PT Goal Formulation: With patient/family Time For Goal Achievement: 12/28/22 Progress towards PT goals: Progressing toward goals    Frequency    Min 1X/week      PT Plan Current plan remains appropriate       AM-PAC PT "6 Clicks" Mobility   Outcome Measure  Help needed turning from your back to your side while in a flat bed without using bedrails?: None Help needed moving from lying on your back to sitting on the side of a flat bed without using bedrails?: None Help needed moving to and from a bed to a chair (including a wheelchair)?: A Little Help needed standing up from a chair using your arms (e.g., wheelchair or bedside chair)?: None Help needed to walk in hospital room?: A Little Help needed climbing 3-5 steps with a railing? : A Little 6 Click Score: 21    End of Session Equipment Utilized During Treatment: Gait belt Activity Tolerance: Patient tolerated treatment well;Patient limited by pain Patient left: in chair;with call bell/phone within reach;Other  (comment) (RLE elevated, ice pack to RLE pt instructed on frequency) Nurse Communication: Mobility status;Patient requests pain meds PT Visit Diagnosis: Unsteadiness on feet (R26.81);Other abnormalities of gait and mobility (R26.89);Muscle weakness (generalized) (M62.81);Difficulty in walking, not elsewhere classified (R26.2);Pain Pain - Right/Left: Right Pain - part of body: Leg     Time: 1215-1238 PT Time Calculation (min) (ACUTE ONLY): 23 min  Charges:  $Gait Training: 8-22 mins $Therapeutic Activity: 8-22 mins                     Kenika Sahm P., PTA Acute Rehabilitation Services Secure Chat Preferred 9a-5:30pm Office: [phone]    Angus Palms 12/17/2022, 4:32 PM

## 2022-12-17 NOTE — Inpatient Diabetes Management (Signed)
Inpatient Diabetes Program Recommendations  AACE/ADA: New Consensus Statement on Inpatient Glycemic Control (2015)  Target Ranges:  Prepandial:   less than 140 mg/dL      Peak postprandial:   less than 180 mg/dL (1-2 hours)      Critically ill patients:  140 - 180 mg/dL   Lab Results  Component Value Date   GLUCAP 184 (H) 12/17/2022   HGBA1C 7.6 (H) 11/01/2022    Review of Glycemic Control  Diabetes history: type 2 Outpatient Diabetes medications: Metformin 500 mg BID, Glipizide 2.5 mg daily, Jardiance 10 mg daily Current orders for Inpatient glycemic control: Semglee 8 units daily, Novolog 0-20 units correction scale TID, Novolog 0-5 units HS scale  Inpatient Diabetes Program Recommendations:   Noted that patient has been on Semglee 8 units daily while in the hospital. hgbA1C is 7.6%.   For discharge recommend: Increase Metformin to 1000 mg BID, continue Glipizide 2.5 mg daily, Jardiance 10 mg daily.   Smith Mince RN BSN CDE Diabetes Coordinator Pager: [phone]  8am-5pm

## 2022-12-18 LAB — GLUCOSE, CAPILLARY: Glucose-Capillary: 124 mg/dL — ABNORMAL HIGH (ref 70–99)

## 2022-12-18 MED ORDER — METFORMIN HCL 500 MG PO TABS: ORAL_TABLET | ORAL | Status: DC

## 2022-12-18 MED ORDER — PANTOPRAZOLE SODIUM 40 MG PO TBEC: 40.0000 mg | DELAYED_RELEASE_TABLET | Freq: Two times a day (BID) | ORAL | Status: DC | PRN

## 2022-12-18 MED ORDER — HYDROCHLOROTHIAZIDE 12.5 MG PO TABS: 6.2500 mg | ORAL_TABLET | Freq: Every evening | ORAL | Status: DC

## 2022-12-18 NOTE — Progress Notes (Signed)
Physical Therapy Treatment Patient Details Name: Jim [name] MRN: [id number] DOB: [birth year] Today's Date: 12/18/2022   History of Present Illness 67 y.o. male presented to [facility] 5/13 for R leg femoral to posterior tibial artery bypass for treatment of right lower extremity critical ischemia with rest pain.  PMH: HTN, PVD, DM, DJD, Tobacco use.    PT Comments    Pt received sitting EOB, agreeable to therapy session prior to DC, pt requesting to get dressed and mobilize in room, pain limiting his gait tolerance to short household distance. Pt modI for transfers this date to RW with good safety and only needs min cues for improved RW management/body mechanics during gait trial. VSS on RA with TED hose donned throughout, emphasis on energy conservation/activity pacing and gradual progression of mobility within tolerance as well as positioning for edema mgmt and cryotherapy PRN. Pt continues to benefit from PT services to progress toward functional mobility goals.    Recommendations for follow up therapy are one component of a multi-disciplinary discharge planning process, led by the attending physician.  Recommendations may be updated based on patient status, additional functional criteria and insurance authorization.  Follow Up Recommendations       Assistance Recommended at Discharge Frequent or constant Supervision/Assistance  Patient can return home with the following A little help with walking and/or transfers;A little help with bathing/dressing/bathroom;Assistance with cooking/housework;Assist for transportation;Help with stairs or ramp for entrance   Equipment Recommendations  None recommended by PT    Recommendations for Other Services       Precautions / Restrictions Precautions Precautions: None Precaution Comments: watch BP (use TED hose for standing/OOB) Restrictions Weight Bearing Restrictions: No     Mobility  Bed Mobility Overal bed mobility: Needs Assistance              General bed mobility comments: pt received seated EOB    Transfers Overall transfer level: Needs assistance Equipment used: Rolling walker (2 wheels) Transfers: Sit to/from Stand Sit to Stand: Modified independent (Device/Increase time)           General transfer comment: Pt demonstrated good recall of extending leg to decrease pain associated with RLE bending with transfers to/from EOB and chair, no cues needed; slow, carefuul    Ambulation/Gait Ambulation/Gait assistance: Supervision Gait Distance (Feet): 45 Feet Assistive device: Rolling walker (2 wheels) Gait Pattern/deviations: Step-to pattern, Decreased dorsiflexion - right, Decreased step length - right, Antalgic Gait velocity: grossly <0.4 m/s     General Gait Details: Mild to moderate reliance on RW, cues for activity pacing/safety, and RW proximity to reduce pain in RLE with step-to pattern; no seated break needed, distance limited due to upcoming DC, pt defers stair trial but able to verbalize safe sequencing and reports no concerns re: stairs; good carryover from previous session stair instruction   Stairs             Wheelchair Mobility    Modified Rankin (Stroke Patients Only)       Balance Overall balance assessment: Needs assistance Sitting-balance support: Feet supported, No upper extremity supported Sitting balance-Leahy Scale: Normal Sitting balance - Comments: able to don socks/lean in all directions no LOB   Standing balance support: During functional activity, Reliant on assistive device for balance, Single extremity supported Standing balance-Leahy Scale: Fair Standing balance comment: light to moderate RW reliance during gait, static standing with 0-1 UE support fair, no LOB  Cognition Arousal/Alertness: Awake/alert Behavior During Therapy: WFL for tasks assessed/performed Overall Cognitive Status: Within Functional Limits for tasks  assessed                                 General Comments: cooperative, motivated for DC        Exercises Other Exercises Other Exercises: Seated/standing RLE AROM ankle pumps x10 reps Other Exercises: seated RLE AROM: hip flexion, LAQ x10 reps ea    General Comments General comments (skin integrity, edema, etc.): BP 110/63 (77) seated (TED hose remained donned throughout), HR low to mid 60's bpm per tele sitting. No acute s/sx distress with exertional tasks; Some drainage visible on RLE through TED hose, RN aware; emphasized to elevate RLE and use of ice PRN 20 mins on/off for pain mgmt. Pt modI to don UB/LB clothing, PTA standing by for safety and to assist with removing tele leads prior to DC but not needing to assist. D/C RN notified of pt needing IV out prior to DC and his ride has arrived.      Pertinent Vitals/Pain Pain Assessment Pain Assessment: Faces Faces Pain Scale: Hurts little more Pain Location: RLE Pain Descriptors / Indicators: Discomfort, Grimacing Pain Intervention(s): Monitored during session, Premedicated before session, Repositioned (pain meds ~10 mins prior)     PT Goals (current goals can now be found in the care plan section) Acute Rehab PT Goals Patient Stated Goal: get back to gardening, less pain PT Goal Formulation: With patient/family Time For Goal Achievement: 12/28/22 Progress towards PT goals: Progressing toward goals (met all goals except amb distance)    Frequency    Min 1X/week      PT Plan Current plan remains appropriate       AM-PAC PT "6 Clicks" Mobility   Outcome Measure  Help needed turning from your back to your side while in a flat bed without using bedrails?: None Help needed moving from lying on your back to sitting on the side of a flat bed without using bedrails?: None Help needed moving to and from a bed to a chair (including a wheelchair)?: None Help needed standing up from a chair using your arms (e.g.,  wheelchair or bedside chair)?: None Help needed to walk in hospital room?: A Little Help needed climbing 3-5 steps with a railing? : A Little 6 Click Score: 22    End of Session Equipment Utilized During Treatment: Gait belt Activity Tolerance: Patient tolerated treatment well;Patient limited by pain Patient left: in chair;with call bell/phone within reach;Other (comment) (RLE elevated, pt defers ice) Nurse Communication: Mobility status;Other (comment) (pt eager to DC, his ride has arrived) PT Visit Diagnosis: Unsteadiness on feet (R26.81);Other abnormalities of gait and mobility (R26.89);Muscle weakness (generalized) (M62.81);Difficulty in walking, not elsewhere classified (R26.2);Pain Pain - Right/Left: Right Pain - part of body: Leg     Time: 1104-1120 PT Time Calculation (min) (ACUTE ONLY): 16 min  Charges:  $Therapeutic Activity: 8-22 mins                     Teruko Joswick P., PTA Acute Rehabilitation Services Secure Chat Preferred 9a-5:30pm Office: [phone]    Dorathy Kinsman [facility] 12/18/2022, 11:47 AM

## 2022-12-18 NOTE — Progress Notes (Signed)
Patient given discharge instructions and verbalized understanding. PIV removed and patient was already dressed. Patient given dressing supplies for home. Discharged home with family.

## 2022-12-18 NOTE — Discharge Summary (Signed)
Discharge Summary     Jim [name] Dec 05, 1955 67 y.o. male  [id number]  Admission Date: 12/13/2022  Discharge Date: 12/22/2022  Physician: No att. providers found  Admission Diagnosis: Critical limb ischemia of right lower extremity (HCC) [I70.221]  HPI:   This is a 67 y.o. male , who is here today for evaluation of right greater than left leg intermittent claudication. He was seen in our office in 2019 with similar symptoms. He underwent arteriography at [facility] on 05/19/2018. 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 He had successful crossing of this occlusion and angioplasty and stenting of the lesion. He did not return for follow-up. He presents now with progressive claudication symptoms. He reports that the right leg is worse than the left. He reports that this makes it difficult for him to do his daily activities even around the house. He reports that he enjoys doing yard work and gardening and is unable to do this due to his claudication symptoms. This is calf claudication only. He does not have any tissue loss.   Hospital Course:  The patient was admitted to the hospital and taken to the operating room on 12/13/2022 and underwent: Right leg Femoral to posterior tibial artery bypass using ipsilateral, non-reversed greater saphenous vein.     Findings: Mild atherosclerotic disease in the femoral artery.  This did not require endarterectomy 5-3 mm tapering greater saphenous vein 3 mm posterior tibial artery, no significant calcific disease  The pt tolerated the procedure well and was transported to the PACU in good condition.   By POD 1, pt had a brisk right PT doppler signal.  He was hyperglycemic and DM coordinator was consulted.    The remainder of his hospitalization:  Pt had home pain management regimen and this was gradually increased back to his  baseline.  He did work with PT to increase mobilization.  He did have acute blood loss anemia and this remained stable.  He did have a thrombocytopenia that appeared to be at baseline.  Ultimately, his Metformin was increased to 1000mg  bid and he continued his other DM medications.  He is to follow up with his PCP after hospitalization to review his medications.  Wound care for groin incision was discussed with pt.  He was discharged home on POD 5.    CBC    Component Value Date/Time   WBC 7.1 12/16/2022 0837   RBC 3.99 (L) 12/16/2022 0837   HGB 12.7 (L) 12/16/2022 0837   HGB 15.9 07/15/2022 1349   HCT 38.8 (L) 12/16/2022 0837   HCT 48.2 07/15/2022 1349   PLT 88 (L) 12/16/2022 0837   PLT 112 (L) 07/15/2022 1349   MCV 97.2 12/16/2022 0837   MCV 95 07/15/2022 1349   MCH 31.8 12/16/2022 0837   MCHC 32.7 12/16/2022 0837   RDW 13.7 12/16/2022 0837   RDW 13.9 07/15/2022 1349   LYMPHSABS 1.7 07/15/2022 1349   MONOABS 0.7 07/07/2022 0926   EOSABS 0.1 07/15/2022 1349   BASOSABS 0.0 07/15/2022 1349    BMET    Component Value Date/Time   NA 132 (L) 12/14/2022 0151   NA 144 07/15/2022 1349   K 4.0 12/14/2022 0151   CL 103 12/14/2022 0151   CO2 22 12/14/2022 0151   GLUCOSE 301 (H) 12/14/2022 0151   BUN 17 12/14/2022 0151   BUN 14 07/15/2022 1349   CREATININE  0.99 12/14/2022 0151   CREATININE 0.93 08/13/2014 0804   CALCIUM 8.1 (L) 12/14/2022 0151   GFRNONAA >60 12/14/2022 0151   GFRAA 77 05/22/2019 0942     Discharge Instructions     AMB Referral to Advanced Lipid Disorders Clinic   Complete by: As directed    Internal Lipid Clinic Referral Scheduling  Internal lipid clinic referrals are providers within [facility], who wish to refer established patients for routine management (help in starting PCSK9 inhibitor therapy) or advanced therapies.  Internal MD referral criteria:              1. All patients with LDL>190 mg/dL  2. All patients with Triglycerides >500 mg/dL  3. Patients  with suspected or confirmed heterozygous familial hyperlipidemia (HeFH) or homozygous familial hyperlipidemia (HoFH)  4. Patients with family history of suspicious for genetic dyslipidemia desiring genetic testing  5. Patients refractory to standard guideline based therapy  6. Patients with statin intolerance (failed 2 statins, one of which must be a high potency statin)  7. Patients who the provider desires to be seen by MD   Internal PharmD referral criteria:   1. Follow-up patients for medication management  2. Follow-up for compliance monitoring  3. Patients for drug education  4. Patients with statin intolerance  5. PCSK9 inhibitor education and prior authorization approvals  6. Patients with triglycerides <500 mg/dL  External Lipid Clinic Referral  External lipid clinic referrals are for providers outside of [facility], considered new clinic patients - automatically routed to MD schedule   Discharge patient   Complete by: As directed    Discharge disposition: 01-Home or Self Care   Discharge patient date: 12/18/2022       Discharge Diagnosis:  Critical limb ischemia of right lower extremity (HCC) [I70.221]  Secondary Diagnosis: Patient Active Problem List   Diagnosis Date Noted   Critical limb ischemia of right lower extremity (HCC) 12/13/2022   Secondary esophageal 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) 08/06/2022   Thrombocytopenia (HCC) 08/06/2022   Abdominal pain, epigastric 07/08/2022   Odynophagia 07/08/2022   Esophageal dysphagia 07/08/2022   Cirrhosis of liver without ascites (HCC) 07/08/2022   Abdominal pain 07/07/2022   Trochanteric bursitis of both hips 07/31/2021   Right shoulder pain 12/10/2020   Spinal stenosis of lumbar region with neurogenic claudication 12/10/2020   Hyperlipidemia associated with type 2 diabetes mellitus (HCC) 10/21/2020   Arthropathy of lumbar facet joint 06/23/2020   PVD (peripheral vascular disease) with claudication (HCC) 05/21/2018    Type 2 diabetes mellitus with diabetic neuropathy, unspecified (HCC) 01/11/2018   Major depression in partial remission (HCC) 07/07/2016   Insomnia 04/13/2016   Primary localized osteoarthritis of right knee    Chronic pain syndrome 12/10/2014   HTN (hypertension), benign 08/15/2013   Hyperglycemia 08/15/2013   Depression    Gastric ulcer    Arthritis    Bursitis of hip    DJD (degenerative joint disease) of knee    Ex-smoker    Back pain    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 Left > Right   Esophageal ulcer    Ex-smoker    Quit 11/07/2011   Gastric ulcer    GERD (gastroesophageal reflux disease)    High cholesterol  History of colon polyps    Hyperlipidemia    Hypertension    Primary localized osteoarthritis of right knee    PVD (peripheral vascular disease) with claudication (HCC) 05/21/2018   Under the care of vascular surgery   Sleep apnea      Allergies as of 12/18/2022       Reactions   Doxycycline Nausea Only   PATIENT STILL TOLERATES   Pravastatin Other (See Comments)   myalgia        Medication List     STOP taking these medications    hydrochlorothiazide 12.5 MG capsule Commonly known as: MICROZIDE       TAKE these medications    acetaminophen 500 MG tablet Commonly known as: TYLENOL Take 500-1,000 mg by mouth every 6 (six) hours as needed (pain.).   albuterol 108 (90 Base) MCG/ACT inhaler Commonly known as: VENTOLIN HFA Inhale 2 puffs into the lungs every 6 (six) hours as needed for wheezing.   aspirin EC 81 MG tablet Take 1 tablet (81 mg total) by mouth daily. Swallow whole.   blood glucose meter kit and supplies Dispense based on patient and insurance preference. Use to check glucose once daily as directed. (FOR ICD-10 E11.9).   buPROPion  150 MG 24 hr tablet Commonly known as: WELLBUTRIN XL TAKE 1 TABLET BY MOUTH EVERY DAY   carvedilol 3.125 MG tablet Commonly known as: COREG TAKE 1 TABLET BY MOUTH TWICE A DAY WITH A MEAL   clopidogrel 75 MG tablet Commonly known as: PLAVIX TAKE 1 TABLET BY MOUTH EVERY DAY   ezetimibe 10 MG tablet Commonly known as: ZETIA TAKE 1 TABLET BY MOUTH EVERY DAY What changed: when to take this   FLUoxetine 20 MG capsule Commonly known as: PROZAC TAKE 3 CAPSULES BY MOUTH EVERY DAY What changed: when to take this   gabapentin 400 MG capsule Commonly known as: NEURONTIN Take 400 mg by mouth at bedtime.   glipiZIDE 2.5 MG 24 hr tablet Commonly known as: GLUCOTROL XL Take 2.5 mg by mouth in the morning.   Jardiance 10 MG Tabs tablet Generic drug: empagliflozin TAKE 1 TABLET BY MOUTH DAILY BEFORE BREAKFAST.   metFORMIN 500 MG tablet Commonly known as: GLUCOPHAGE TAKE 2 TABLET BY MOUTH EVERY MORNING AND EVENING What changed: additional instructions   naloxone 4 MG/0.1ML Liqd nasal spray kit Commonly known as: NARCAN Use as directed   [employer] technical officer test strip Generic drug: glucose blood USE TO CHECK BLOOD SUGAR LEVELS ONCE DAILY   Oxycodone HCl 20 MG Tabs Take 20 mg by mouth every 4 (four) hours as needed (pain.). Take 1 tablet by mouth 4-5 times daily if tolerated   pantoprazole 40 MG tablet Commonly known as: Protonix Take 1 tablet (40 mg total) by mouth 2 (two) times daily as needed (indigestion/heartburn.).   rosuvastatin 5 MG tablet Commonly known as: CRESTOR TAKE 1 TABLET ON MONDAY AND FRIDAY   tamsulosin 0.4 MG Caps capsule Commonly known as: FLOMAX TAKE 1 CAPSULE BY MOUTH EVERY DAY   traZODone 50 MG tablet Commonly known as: DESYREL TAKE 1 TABLET BY MOUTH AT BEDTIME AS NEEDED FOR INSOMNIA What changed:  how much to take how to take this when to take this additional instructions        Discharge Instructions: [facility] Discharge instructions Lower Extremity Bypass Surgery  Please refer to the following instruction for your post-procedure care. Your surgeon or physician assistant will discuss  any changes with you.  Activity  You are encouraged to walk as much as you can. You can slowly return to normal activities during the month after your surgery. Avoid strenuous activity and heavy lifting until your doctor tells you it's OK. Avoid activities such as vacuuming or swinging a golf club. Do not drive until your doctor give the OK and you are no longer taking prescription pain medications. It is also normal to have difficulty with sleep habits, eating and bowel movement after surgery. These will go away with time.  Bathing/Showering  You may shower after you go home. Do not soak in a bathtub, hot tub, or swim until the incision heals completely.  Incision Care  Clean your incision with mild soap and water. Shower every day. Pat the area dry with a clean towel. You do not need a bandage unless otherwise instructed. Do not apply any ointments or creams to your incision. If you have open wounds you will be instructed how to care for them or a visiting nurse may be arranged for you. If you have staples or sutures along your incision they will be removed at your post-op appointment. You may have skin glue on your incision. Do not peel it off. It will come off on its own in about one week.  Wash the groin wound with soap and water daily and pat dry. (No tub bath-only shower)  Then put a dry gauze or washcloth in the groin to keep this area dry to help prevent wound infection.  Do this daily and as needed.  Do not use Vaseline or neosporin on your incisions.  Only use soap and water on your incisions and then protect and keep dry.  Diet  Resume your normal diet. There are no special food restrictions following this procedure. A low fat/ low cholesterol diet is recommended for all  patients with vascular disease. In order to heal from your surgery, it is CRITICAL to get adequate nutrition. Your body requires vitamins, minerals, and protein. Vegetables are the best source of vitamins and minerals. Vegetables also provide the perfect balance of protein. Processed food has little nutritional value, so try to avoid this.  Medications  Resume taking all your medications unless your doctor or Physician Assistant tells you not to. If your incision is causing pain, you may take over-the-counter pain relievers such as acetaminophen (Tylenol). If you were prescribed a stronger pain medication, please aware these medication can cause nausea and constipation. Prevent nausea by taking the medication with a snack or meal. Avoid constipation by drinking plenty of fluids and eating foods with high amount of fiber, such as fruits, vegetables, and grains. Take Colace 100 mg (an over-the-counter stool softener) twice a day as needed for constipation.  Do not take Tylenol if you are taking prescription pain medications.  Follow Up  Our office will schedule a follow up appointment 2-3 weeks following discharge.  Please call us immediately for any of the following conditions  Severe or worsening pain in your legs or feet while at rest or while walking Increase pain, redness, warmth, or drainage (pus) from your incision site(s) Fever of 101 degree or higher The swelling in your leg with the bypass suddenly worsens and becomes more painful than when you were in the hospital If you have been instructed to feel your graft pulse then you should do so every day. If you can no longer feel this pulse, call the office immediately. Not all patients are given this  instruction.  Leg swelling is common after leg bypass surgery.  The swelling should improve over a few months following surgery. To improve the swelling, you may elevate your legs above the level of your heart while you are sitting or resting.  Your surgeon or physician assistant may ask you to apply an ACE wrap or wear compression (TED) stockings to help to reduce swelling.  Reduce your risk of vascular disease  Stop smoking. If you would like help call [facility] at 1-800-QUIT-NOW (475-090-3299) or [facility] at 5053556110.  Manage your cholesterol Maintain a desired weight Control your diabetes weight Control your diabetes Keep your blood pressure down  If you have any questions, please call the office at [phone]   Prescriptions given:  Metformin changed to 1000mg  bid (no rx given)  Pt has pain contract-no narcotics prescribed.  Disposition: home  Patient's condition: is Good  Follow up: 1. [facility] in 2-3 weeks   Doreatha Massed, PA-C [facility] [phone] 12/22/2022  1:27 PM  - For VQI Registry use ---   Post-op:  Wound infection: No  Graft infection: No  Transfusion: No    If yes, n/a units given New Arrhythmia: No Ipsilateral amputation: No, [ ]  Minor, [ ]  BKA, [ ]  AKA Discharge patency: [x ] Primary, [ ]  Primary assisted, [ ]  Secondary, [ ]  Occluded Patency judged by: [x ] Dopper only, [ ]  Palpable graft pulse, []  Palpable distal pulse, [ ]  ABI inc. > 0.15, [ ]  Duplex Discharge ABI: R not done, L  D/C Ambulatory Status: Ambulatory  Complications: MI: No, [ ]  Troponin only, [ ]  EKG or Clinical CHF: No Resp failure:No, [ ]  Pneumonia, [ ]  Ventilator Chg in renal function: No, [ ]  Inc. Cr > 0.5, [ ]  Temp. Dialysis,  [ ]  Permanent dialysis Stroke: No, [ ]  Minor, [ ]  Major Return to OR: No  Reason for return to OR: [ ]  Bleeding, [ ]  Infection, [ ]  Thrombosis, [ ]  Revision  Discharge medications: Statin use:  yes ASA use:  yes Plavix use:  yes Beta blocker use: yes CCB use:  No ACEI use:   yes ARB use:  no Coumadin use: no

## 2022-12-18 NOTE — Progress Notes (Addendum)
Progress Note    12/18/2022 6:56 AM 5 Days Post-Op  Subjective:  sitting in chair eating breakfast.  Says he is ready to go home.  Pain much better controlled since restarting home pain regimen.  Says they took his socks off for a while and just put them back on.  Says he foot feels pretty good.   Afebrile HR 50's-60's NSR 90's-110's systolic 98% RA  Vitals:   12/17/22 2044 12/17/22 2317  BP: (!) 99/53 (!) 98/55  Pulse: (!) 56 70  Resp: 17 14  Temp: 97.9 F (36.6 C) 97.8 F (36.6 C)  SpO2: 96% 96%    Physical Exam: General:  no distress Lungs:  non labored Incisions:  right groin is clean and intact; lower leg incisions covered with ted hose  Extremities:  right foot is warm Abdomen:  soft  CBC    Component Value Date/Time   WBC 7.1 12/16/2022 0837   RBC 3.99 (L) 12/16/2022 0837   HGB 12.7 (L) 12/16/2022 0837   HGB 15.9 07/15/2022 1349   HCT 38.8 (L) 12/16/2022 0837   HCT 48.2 07/15/2022 1349   PLT 88 (L) 12/16/2022 0837   PLT 112 (L) 07/15/2022 1349   MCV 97.2 12/16/2022 0837   MCV 95 07/15/2022 1349   MCH 31.8 12/16/2022 0837   MCHC 32.7 12/16/2022 0837   RDW 13.7 12/16/2022 0837   RDW 13.9 07/15/2022 1349   LYMPHSABS 1.7 07/15/2022 1349   MONOABS 0.7 07/07/2022 0926   EOSABS 0.1 07/15/2022 1349   BASOSABS 0.0 07/15/2022 1349    BMET    Component Value Date/Time   NA 132 (L) 12/14/2022 0151   NA 144 07/15/2022 1349   K 4.0 12/14/2022 0151   CL 103 12/14/2022 0151   CO2 22 12/14/2022 0151   GLUCOSE 301 (H) 12/14/2022 0151   BUN 17 12/14/2022 0151   BUN 14 07/15/2022 1349   CREATININE 0.99 12/14/2022 0151   CREATININE 0.93 08/13/2014 0804   CALCIUM 8.1 (L) 12/14/2022 0151   GFRNONAA >60 12/14/2022 0151   GFRAA 77 05/22/2019 0942    INR    Component Value Date/Time   INR 1.0 12/13/2022 0934     Intake/Output Summary (Last 24 hours) at 12/18/2022 0656 Last data filed at 12/17/2022 2317 Gross per 24 hour  Intake 1440 ml  Output 2860 ml   Net -1420 ml      Assessment/Plan:  67 y.o. male is s/p:  Right leg Femoral to posterior tibial artery bypass using ipsilateral, non-reversed greater saphenous vein   5 Days Post-Op   -pt doing well this morning and ready to go home -discussed groin wound care as well as other incision care-he can shower and then replace hose.  Discussed only time his groin incision should be wet is if he is in the shower.  Otherwise, keep dry.  -DVT prophylaxis:  sq heparin -discharge home and f/u in 2-3 weeks for incision check.  He knows to call sooner if any issues before then. -given his pain contract, he knows we will not prescribe any narcotics at discharge. -he will increase his Metformin to 1000mg  bid.  He has enough 500mg  pills to do this.  He does check his sugar at home and he will back down if his sugars start to decrease.  He will f/u with his PCP next week to evaluate sugar and medications.   Doreatha Massed, PA-C [facility] [phone] 12/18/2022 6:56 AM  VASCULAR STAFF ADDENDUM: I have independently  interviewed and examined the patient. I agree with the above. Edematous but palpable PT pulse.  Hold HCTZ at home due to stable BP in the 90s-100s- can be restarted by primary  ASA/ Plavix Home today Please redress wounds and ensure an extra TED hose prior to d/c  Home bowel regimen  Fara Olden, MD [facility] Phone Number: [phone] 12/18/2022 8:54 AM

## 2022-12-21 ENCOUNTER — Telehealth: Payer: Self-pay

## 2022-12-21 ENCOUNTER — Other Ambulatory Visit: Payer: Self-pay | Admitting: Family Medicine

## 2022-12-21 DIAGNOSIS — M5030 Other cervical disc degeneration, unspecified cervical region: Secondary | ICD-10-CM | POA: Diagnosis not present

## 2022-12-21 NOTE — Transitions of Care (Post Inpatient/ED Visit) (Signed)
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 How have you been since you were released from the hospital?: Better Any questions or concerns?: No  Items Reviewed: Did you receive and understand the discharge instructions provided?: Yes Medications obtained,verified, and reconciled?: Yes (Medications Reviewed) Any new allergies since your discharge?: No Dietary orders reviewed?: Yes Type of Diet Ordered:: Low fat/cholesterol Do you have support at home?: Yes People in Home: spouse Name of Support/Comfort Primary Source: [place]  Medications Reviewed Today: Medications Reviewed Today     Reviewed by Jodelle Gross, RN (Case Manager) on 12/21/22 at 1018  Med List Status: <None>   Medication Order Taking? Sig Documenting Provider Last Dose Status Informant  acetaminophen (TYLENOL) 500 MG tablet [id number] No Take 500-1,000 mg by mouth every 6 (six) hours as needed (pain.).  Patient not taking: Reported on 12/21/2022   [provider] Not Taking Active Spouse/Significant Other  albuterol (VENTOLIN HFA) 108 (90 Base) MCG/ACT inhaler [id number] Yes Inhale 2 puffs into the lungs every 6 (six) hours as needed for wheezing. Babs Sciara, MD Taking Active Spouse/Significant Other  aspirin EC 81 MG tablet [id number] No Take 1 tablet (81 mg total) by mouth daily. Swallow whole. [name], MD Unknown Active   blood glucose meter kit and supplies [id number] Yes Dispense based on patient and insurance preference. Use to check glucose once daily as directed. (FOR ICD-10 E11.9). Babs Sciara, MD Taking Active Spouse/Significant Other  buPROPion (WELLBUTRIN XL) 150 MG 24 hr tablet [id number] Yes TAKE 1 TABLET BY MOUTH EVERY DAY Luking, Jonna Coup, MD Taking Active Spouse/Significant Other  carvedilol (COREG) 3.125 MG tablet [id number] Yes TAKE 1 TABLET BY MOUTH TWICE A DAY WITH A MEAL Luking, Scott A, MD Taking Active Spouse/Significant Other  clopidogrel (PLAVIX) 75 MG tablet [id number] Yes TAKE 1 TABLET BY MOUTH EVERY DAY Luking, Scott A, MD Taking Active Spouse/Significant Other  ezetimibe (ZETIA) 10 MG tablet [id number] Yes TAKE 1 TABLET BY MOUTH EVERY DAY  Patient taking differently: Take 10 mg by mouth at bedtime.   Babs Sciara, MD Taking Active Spouse/Significant Other  FLUoxetine (PROZAC) 20 MG capsule [id number] Yes TAKE 3 CAPSULES BY MOUTH EVERY DAY  Patient taking differently: Take 60 mg by mouth at bedtime.   Babs Sciara, MD Taking Active Spouse/Significant Other  gabapentin (NEURONTIN) 400 MG capsule [id number] Yes Take 400 mg by mouth at bedtime. [provider] Taking Active Spouse/Significant Other  glipiZIDE (GLUCOTROL XL) 2.5 MG 24 hr tablet [id number] Yes Take 2.5 mg by mouth in the morning. [provider] Taking Active Spouse/Significant Other  JARDIANCE 10 MG TABS tablet [id number] Yes TAKE 1 TABLET BY MOUTH DAILY BEFORE BREAKFAST. Babs Sciara, MD Taking Active Spouse/Significant Other  Lancets Letta Pate ULTRASOFT) lancets [id number] Yes  [provider] Taking Active Spouse/Significant Other  lisinopril (ZESTRIL) 5 MG tablet [id number] Yes Take 5 mg by mouth every evening. [provider] Taking Active Spouse/Significant Other  metFORMIN (GLUCOPHAGE) 500 MG tablet [id number] Yes TAKE 2 TABLET BY MOUTH EVERY MORNING AND EVENING Rhyne, Ames Coupe, PA-C Taking Active   naloxone (NARCAN) nasal spray 4 mg/0.1 mL [id number] No Use as directed  Patient not taking: Reported on 12/21/2022   Babs Sciara, MD Not Taking Active  Spouse/Significant Other  Lum Babe test strip [id number] Yes USE TO CHECK BLOOD SUGAR LEVELS ONCE DAILY Gerda Diss, Jonna Coup, MD Taking Active Spouse/Significant Other  Oxycodone HCl 20 MG TABS [id number] Yes Take 20 mg by mouth every 4 (four) hours as needed (pain.). Take 1 tablet by mouth 4-5 times daily if tolerated [provider] Taking Active Spouse/Significant Other           Med Note Peggye Pitt, [facility] M   Wed Jul 16, 2020  9:37 AM) 5 -6 per day  pantoprazole (PROTONIX) 40 MG tablet [id number] Yes Take 1 tablet (40 mg total) by mouth 2 (two) times daily as needed (indigestion/heartburn.). Dara Lords, PA-C Taking Active   rosuvastatin (CRESTOR) 5 MG tablet [id number] Yes TAKE 1 TABLET ON MONDAY AND FRIDAY Babs Sciara, MD Taking Active Spouse/Significant Other  tamsulosin (FLOMAX) 0.4 MG CAPS capsule [id number] No TAKE 1 CAPSULE BY MOUTH EVERY DAY  Patient not taking: Reported on 12/06/2022   Babs Sciara, MD Not Taking Active Spouse/Significant Other  traZODone (DESYREL) 50 MG tablet [id number] Yes TAKE 1 TABLET BY MOUTH AT BEDTIME AS NEEDED FOR INSOMNIA  Patient taking differently: Take 50 mg by mouth at bedtime.   Babs Sciara, MD Taking Active Spouse/Significant Other            Home Care and Equipment/Supplies: Were Home Health Services Ordered?: Yes [facility] Agency[facility] Has Agency set up a time to come to your home?: Yes First Home Health Visit Date: 12/22/22 Any new equipment or medical supplies ordered?: No  Functional Questionnaire: Do you need assistance with bathing/showering or dressing?: Yes Do you need assistance with meal preparation?: No Do you need assistance with eating?: No Do you have difficulty maintaining continence: No Do you need assistance with getting out of bed/getting out of a chair/moving?: No Do you have difficulty managing or taking your medications?: No  Follow up appointments reviewed: PCP Follow-up appointment  confirmed?: Yes Date of PCP follow-up appointment?: 12/24/22 Follow-up Provider: Dr. Gerda Diss Specialist [facility] Follow-up appointment confirmed?: NA Do you need transportation to your follow-up appointment?: No Do you understand care options if your condition(s) worsen?: Yes-patient verbalized understanding  SDOH Interventions Today    Flowsheet Row Most Recent Value  SDOH Interventions   Food Insecurity Interventions Intervention Not Indicated  Housing Interventions Intervention Not Indicated  Transportation Interventions Intervention Not Indicated      Interventions Today    Flowsheet Row Most Recent Value  Chronic Disease   Chronic disease during today's visit Hypertension (HTN)  [PVD]  General Interventions   General Interventions Discussed/Reviewed General Interventions Discussed, Doctor Visits  Doctor Visits Discussed/Reviewed Doctor Visits Discussed       TOC Interventions Today    Flowsheet Row Most Recent Value  TOC Interventions   TOC Interventions Discussed/Reviewed TOC Interventions Discussed, Post discharge activity limitations per provider, Post op wound/incision care, S/S of infection       Jodelle Gross, RN, BSN, CCM Care Management Coordinator [place] Phone: [phone]/Fax: [phone]

## 2022-12-22 DIAGNOSIS — E1151 Type 2 diabetes mellitus with diabetic peripheral angiopathy without gangrene: Secondary | ICD-10-CM | POA: Diagnosis not present

## 2022-12-22 DIAGNOSIS — D696 Thrombocytopenia, unspecified: Secondary | ICD-10-CM | POA: Diagnosis not present

## 2022-12-22 DIAGNOSIS — M170 Bilateral primary osteoarthritis of knee: Secondary | ICD-10-CM | POA: Diagnosis not present

## 2022-12-22 DIAGNOSIS — I70202 Unspecified atherosclerosis of native arteries of extremities, left leg: Secondary | ICD-10-CM | POA: Diagnosis not present

## 2022-12-22 DIAGNOSIS — E7800 Pure hypercholesterolemia, unspecified: Secondary | ICD-10-CM | POA: Diagnosis not present

## 2022-12-22 DIAGNOSIS — E1169 Type 2 diabetes mellitus with other specified complication: Secondary | ICD-10-CM | POA: Diagnosis not present

## 2022-12-22 DIAGNOSIS — G4730 Sleep apnea, unspecified: Secondary | ICD-10-CM | POA: Diagnosis not present

## 2022-12-22 DIAGNOSIS — I10 Essential (primary) hypertension: Secondary | ICD-10-CM | POA: Diagnosis not present

## 2022-12-22 DIAGNOSIS — G894 Chronic pain syndrome: Secondary | ICD-10-CM | POA: Diagnosis not present

## 2022-12-22 DIAGNOSIS — M48062 Spinal stenosis, lumbar region with neurogenic claudication: Secondary | ICD-10-CM | POA: Diagnosis not present

## 2022-12-22 DIAGNOSIS — K219 Gastro-esophageal reflux disease without esophagitis: Secondary | ICD-10-CM | POA: Diagnosis not present

## 2022-12-22 DIAGNOSIS — Z7902 Long term (current) use of antithrombotics/antiplatelets: Secondary | ICD-10-CM | POA: Diagnosis not present

## 2022-12-22 DIAGNOSIS — M7072 Other bursitis of hip, left hip: Secondary | ICD-10-CM | POA: Diagnosis not present

## 2022-12-22 DIAGNOSIS — I70221 Atherosclerosis of native arteries of extremities with rest pain, right leg: Secondary | ICD-10-CM | POA: Diagnosis not present

## 2022-12-22 DIAGNOSIS — M7071 Other bursitis of hip, right hip: Secondary | ICD-10-CM | POA: Diagnosis not present

## 2022-12-22 DIAGNOSIS — G4700 Insomnia, unspecified: Secondary | ICD-10-CM | POA: Diagnosis not present

## 2022-12-22 DIAGNOSIS — I8510 Secondary esophageal varices without bleeding: Secondary | ICD-10-CM | POA: Diagnosis not present

## 2022-12-22 DIAGNOSIS — E1140 Type 2 diabetes mellitus with diabetic neuropathy, unspecified: Secondary | ICD-10-CM | POA: Diagnosis not present

## 2022-12-22 DIAGNOSIS — K7460 Unspecified cirrhosis of liver: Secondary | ICD-10-CM | POA: Diagnosis not present

## 2022-12-22 DIAGNOSIS — F17210 Nicotine dependence, cigarettes, uncomplicated: Secondary | ICD-10-CM | POA: Diagnosis not present

## 2022-12-22 DIAGNOSIS — Z48812 Encounter for surgical aftercare following surgery on the circulatory system: Secondary | ICD-10-CM | POA: Diagnosis not present

## 2022-12-22 DIAGNOSIS — R1319 Other dysphagia: Secondary | ICD-10-CM | POA: Diagnosis not present

## 2022-12-22 DIAGNOSIS — M47816 Spondylosis without myelopathy or radiculopathy, lumbar region: Secondary | ICD-10-CM | POA: Diagnosis not present

## 2022-12-22 DIAGNOSIS — J45909 Unspecified asthma, uncomplicated: Secondary | ICD-10-CM | POA: Diagnosis not present

## 2022-12-23 ENCOUNTER — Telehealth: Payer: Self-pay

## 2022-12-23 ENCOUNTER — Other Ambulatory Visit: Payer: Self-pay | Admitting: Family Medicine

## 2022-12-23 DIAGNOSIS — E1151 Type 2 diabetes mellitus with diabetic peripheral angiopathy without gangrene: Secondary | ICD-10-CM | POA: Diagnosis not present

## 2022-12-23 DIAGNOSIS — K7460 Unspecified cirrhosis of liver: Secondary | ICD-10-CM | POA: Diagnosis not present

## 2022-12-23 DIAGNOSIS — I10 Essential (primary) hypertension: Secondary | ICD-10-CM | POA: Diagnosis not present

## 2022-12-23 DIAGNOSIS — E1140 Type 2 diabetes mellitus with diabetic neuropathy, unspecified: Secondary | ICD-10-CM | POA: Diagnosis not present

## 2022-12-23 DIAGNOSIS — Z48812 Encounter for surgical aftercare following surgery on the circulatory system: Secondary | ICD-10-CM | POA: Diagnosis not present

## 2022-12-23 DIAGNOSIS — G4700 Insomnia, unspecified: Secondary | ICD-10-CM | POA: Diagnosis not present

## 2022-12-23 DIAGNOSIS — D696 Thrombocytopenia, unspecified: Secondary | ICD-10-CM | POA: Diagnosis not present

## 2022-12-23 DIAGNOSIS — I8510 Secondary esophageal varices without bleeding: Secondary | ICD-10-CM | POA: Diagnosis not present

## 2022-12-23 DIAGNOSIS — Z7902 Long term (current) use of antithrombotics/antiplatelets: Secondary | ICD-10-CM | POA: Diagnosis not present

## 2022-12-23 DIAGNOSIS — M7071 Other bursitis of hip, right hip: Secondary | ICD-10-CM | POA: Diagnosis not present

## 2022-12-23 DIAGNOSIS — M170 Bilateral primary osteoarthritis of knee: Secondary | ICD-10-CM | POA: Diagnosis not present

## 2022-12-23 DIAGNOSIS — I70221 Atherosclerosis of native arteries of extremities with rest pain, right leg: Secondary | ICD-10-CM | POA: Diagnosis not present

## 2022-12-23 DIAGNOSIS — K219 Gastro-esophageal reflux disease without esophagitis: Secondary | ICD-10-CM | POA: Diagnosis not present

## 2022-12-23 DIAGNOSIS — R1319 Other dysphagia: Secondary | ICD-10-CM | POA: Diagnosis not present

## 2022-12-23 DIAGNOSIS — J45909 Unspecified asthma, uncomplicated: Secondary | ICD-10-CM | POA: Diagnosis not present

## 2022-12-23 DIAGNOSIS — G894 Chronic pain syndrome: Secondary | ICD-10-CM | POA: Diagnosis not present

## 2022-12-23 DIAGNOSIS — F17210 Nicotine dependence, cigarettes, uncomplicated: Secondary | ICD-10-CM | POA: Diagnosis not present

## 2022-12-23 DIAGNOSIS — M48062 Spinal stenosis, lumbar region with neurogenic claudication: Secondary | ICD-10-CM | POA: Diagnosis not present

## 2022-12-23 DIAGNOSIS — E1169 Type 2 diabetes mellitus with other specified complication: Secondary | ICD-10-CM | POA: Diagnosis not present

## 2022-12-23 DIAGNOSIS — E7800 Pure hypercholesterolemia, unspecified: Secondary | ICD-10-CM | POA: Diagnosis not present

## 2022-12-23 DIAGNOSIS — M47816 Spondylosis without myelopathy or radiculopathy, lumbar region: Secondary | ICD-10-CM | POA: Diagnosis not present

## 2022-12-23 DIAGNOSIS — G4730 Sleep apnea, unspecified: Secondary | ICD-10-CM | POA: Diagnosis not present

## 2022-12-23 DIAGNOSIS — I70202 Unspecified atherosclerosis of native arteries of extremities, left leg: Secondary | ICD-10-CM | POA: Diagnosis not present

## 2022-12-23 DIAGNOSIS — M7072 Other bursitis of hip, left hip: Secondary | ICD-10-CM | POA: Diagnosis not present

## 2022-12-23 NOTE — Telephone Encounter (Signed)
[facility] called and doing 1 more PT next week and that is it.

## 2022-12-23 NOTE — Telephone Encounter (Signed)
So noted thank you 

## 2022-12-24 ENCOUNTER — Other Ambulatory Visit: Payer: Self-pay | Admitting: *Deleted

## 2022-12-24 ENCOUNTER — Encounter: Payer: Self-pay | Admitting: Family Medicine

## 2022-12-24 ENCOUNTER — Ambulatory Visit (INDEPENDENT_AMBULATORY_CARE_PROVIDER_SITE_OTHER): Payer: 59 | Admitting: Family Medicine

## 2022-12-24 VITALS — BP 136/84 | Ht 68.0 in | Wt 214.6 lb

## 2022-12-24 DIAGNOSIS — I951 Orthostatic hypotension: Secondary | ICD-10-CM | POA: Diagnosis not present

## 2022-12-24 DIAGNOSIS — L03115 Cellulitis of right lower limb: Secondary | ICD-10-CM | POA: Diagnosis not present

## 2022-12-24 DIAGNOSIS — D696 Thrombocytopenia, unspecified: Secondary | ICD-10-CM | POA: Diagnosis not present

## 2022-12-24 DIAGNOSIS — E1140 Type 2 diabetes mellitus with diabetic neuropathy, unspecified: Secondary | ICD-10-CM

## 2022-12-24 DIAGNOSIS — E785 Hyperlipidemia, unspecified: Secondary | ICD-10-CM | POA: Diagnosis not present

## 2022-12-24 DIAGNOSIS — R0609 Other forms of dyspnea: Secondary | ICD-10-CM | POA: Diagnosis not present

## 2022-12-24 DIAGNOSIS — I10 Essential (primary) hypertension: Secondary | ICD-10-CM

## 2022-12-24 DIAGNOSIS — E1169 Type 2 diabetes mellitus with other specified complication: Secondary | ICD-10-CM | POA: Diagnosis not present

## 2022-12-24 DIAGNOSIS — Z125 Encounter for screening for malignant neoplasm of prostate: Secondary | ICD-10-CM

## 2022-12-24 DIAGNOSIS — I739 Peripheral vascular disease, unspecified: Secondary | ICD-10-CM | POA: Diagnosis not present

## 2022-12-24 MED ORDER — CEPHALEXIN 500 MG PO CAPS
500.0000 mg | ORAL_CAPSULE | Freq: Four times a day (QID) | ORAL | 0 refills | Status: DC
Start: 2022-12-24 — End: 2023-01-07

## 2022-12-24 NOTE — Progress Notes (Unsigned)
   Subjective:    Patient ID: Jim [name], male    DOB: July 30, 1956, 67 y.o.   MRN: [id number]  HPI  Patient arrives for a hospital follow up- has ad swelling in legs- just had vein replaced in leg Patient recently had surgery on the leg He had bypass He brings his medicines in for review We discussed healthy eating We also discussed staying away from smoking as well as alcohol He states he is staying away from alcohol but he does state he smokes a pack a day States it is hard to quit right at the moment not necessarily Motivated to do so Denies chest pain shortness of breath Denies swelling in the legs except on the leg to hand surgery has some swelling in the ankle Has follow-up with surgeon coming up in the future  Review of Systems     Objective:   Physical Exam  General-in no acute distress Eyes-no discharge Lungs-respiratory rate normal, CTA CV-no murmurs,RRR Extremities skin warm dry no edema Neuro grossly normal Behavior normal, alert       Assessment & Plan:  1. PAD (peripheral artery disease) (HCC) Recent surgery surgical area looks good but there is some redness around one of the areas that could be early cellulitis warning signs were discussed antibiotics and  2. Hyperlipidemia associated with type 2 diabetes mellitus (HCC) Continue medication healthy diet  3. Type 2 diabetes mellitus with diabetic neuropathy, without long-term current use of insulin (HCC) Healthy diet, avoid alcohol, avoid sugary drinks starchy foods check labs - Basic metabolic panel  4. Thrombocytopenia (HCC) Some bruising noted on the arms check CBC do these labs before follow-up visit in 2 weeks - CBC with Differential/Platelet  5. Cellulitis of right lower extremity Antibiotics for 7 days warning signs discussed if progressive troubles notify us immediately or surgery-notify vascular surgeon  6. Orthostatic hypotension Stop lisinopril Previously he had stopped HCTZ Follow  blood pressures closely  7. Screening PSA (prostate specific antigen) Screening - PSA  8. DOE (dyspnea on exertion) With the shortness of breath that occurs at times with activity recommended echo especially with him having the orthostasis and low blood pressures along with some edema in the legs denies PND  Patient was counseled at length about quitting smoking unlikely he will do so not highly motivated to do so currently but we did encourage him to cut back from 20 cigarettes a day to 15 a day and the next time was seen where encouraged him to cut back even further

## 2022-12-24 NOTE — Telephone Encounter (Signed)
May have 90 days Jardiance and Crestor.  Please inform pharmacy that HCTZ was discontinued

## 2022-12-28 ENCOUNTER — Other Ambulatory Visit: Payer: Self-pay | Admitting: Family Medicine

## 2022-12-28 DIAGNOSIS — I10 Essential (primary) hypertension: Secondary | ICD-10-CM

## 2022-12-28 DIAGNOSIS — E1140 Type 2 diabetes mellitus with diabetic neuropathy, unspecified: Secondary | ICD-10-CM

## 2022-12-28 MED ORDER — EMPAGLIFLOZIN 10 MG PO TABS
10.0000 mg | ORAL_TABLET | Freq: Every day | ORAL | 1 refills | Status: DC
Start: 2022-12-28 — End: 2023-06-20

## 2022-12-28 MED ORDER — ROSUVASTATIN CALCIUM 5 MG PO TABS: ORAL_TABLET | ORAL | 3 refills | Status: DC

## 2022-12-29 DIAGNOSIS — M7072 Other bursitis of hip, left hip: Secondary | ICD-10-CM | POA: Diagnosis not present

## 2022-12-29 DIAGNOSIS — D696 Thrombocytopenia, unspecified: Secondary | ICD-10-CM | POA: Diagnosis not present

## 2022-12-29 DIAGNOSIS — I70221 Atherosclerosis of native arteries of extremities with rest pain, right leg: Secondary | ICD-10-CM | POA: Diagnosis not present

## 2022-12-29 DIAGNOSIS — R1319 Other dysphagia: Secondary | ICD-10-CM | POA: Diagnosis not present

## 2022-12-29 DIAGNOSIS — K219 Gastro-esophageal reflux disease without esophagitis: Secondary | ICD-10-CM | POA: Diagnosis not present

## 2022-12-29 DIAGNOSIS — G4730 Sleep apnea, unspecified: Secondary | ICD-10-CM | POA: Diagnosis not present

## 2022-12-29 DIAGNOSIS — Z7902 Long term (current) use of antithrombotics/antiplatelets: Secondary | ICD-10-CM | POA: Diagnosis not present

## 2022-12-29 DIAGNOSIS — M47816 Spondylosis without myelopathy or radiculopathy, lumbar region: Secondary | ICD-10-CM | POA: Diagnosis not present

## 2022-12-29 DIAGNOSIS — M7071 Other bursitis of hip, right hip: Secondary | ICD-10-CM | POA: Diagnosis not present

## 2022-12-29 DIAGNOSIS — E1151 Type 2 diabetes mellitus with diabetic peripheral angiopathy without gangrene: Secondary | ICD-10-CM | POA: Diagnosis not present

## 2022-12-29 DIAGNOSIS — E1169 Type 2 diabetes mellitus with other specified complication: Secondary | ICD-10-CM | POA: Diagnosis not present

## 2022-12-29 DIAGNOSIS — G4700 Insomnia, unspecified: Secondary | ICD-10-CM | POA: Diagnosis not present

## 2022-12-29 DIAGNOSIS — Z48812 Encounter for surgical aftercare following surgery on the circulatory system: Secondary | ICD-10-CM | POA: Diagnosis not present

## 2022-12-29 DIAGNOSIS — K7460 Unspecified cirrhosis of liver: Secondary | ICD-10-CM | POA: Diagnosis not present

## 2022-12-29 DIAGNOSIS — I70202 Unspecified atherosclerosis of native arteries of extremities, left leg: Secondary | ICD-10-CM | POA: Diagnosis not present

## 2022-12-29 DIAGNOSIS — E7800 Pure hypercholesterolemia, unspecified: Secondary | ICD-10-CM | POA: Diagnosis not present

## 2022-12-29 DIAGNOSIS — G894 Chronic pain syndrome: Secondary | ICD-10-CM | POA: Diagnosis not present

## 2022-12-29 DIAGNOSIS — M170 Bilateral primary osteoarthritis of knee: Secondary | ICD-10-CM | POA: Diagnosis not present

## 2022-12-29 DIAGNOSIS — E1140 Type 2 diabetes mellitus with diabetic neuropathy, unspecified: Secondary | ICD-10-CM | POA: Diagnosis not present

## 2022-12-29 DIAGNOSIS — I8510 Secondary esophageal varices without bleeding: Secondary | ICD-10-CM | POA: Diagnosis not present

## 2022-12-29 DIAGNOSIS — M48062 Spinal stenosis, lumbar region with neurogenic claudication: Secondary | ICD-10-CM | POA: Diagnosis not present

## 2022-12-29 DIAGNOSIS — F17210 Nicotine dependence, cigarettes, uncomplicated: Secondary | ICD-10-CM | POA: Diagnosis not present

## 2022-12-29 DIAGNOSIS — J45909 Unspecified asthma, uncomplicated: Secondary | ICD-10-CM | POA: Diagnosis not present

## 2022-12-29 DIAGNOSIS — I10 Essential (primary) hypertension: Secondary | ICD-10-CM | POA: Diagnosis not present

## 2022-12-30 ENCOUNTER — Telehealth: Payer: Self-pay

## 2022-12-30 NOTE — Telephone Encounter (Signed)
Pt is calling and follow up on a referral for a echocardiogram and pt has not heard back on this?   Lorie [phone]

## 2022-12-31 NOTE — Telephone Encounter (Signed)
Echo is scheduled at [facility] 02/15/23 at 9:30am  Patient notified.

## 2023-01-06 DIAGNOSIS — D696 Thrombocytopenia, unspecified: Secondary | ICD-10-CM | POA: Diagnosis not present

## 2023-01-06 DIAGNOSIS — E1140 Type 2 diabetes mellitus with diabetic neuropathy, unspecified: Secondary | ICD-10-CM | POA: Diagnosis not present

## 2023-01-07 ENCOUNTER — Ambulatory Visit (INDEPENDENT_AMBULATORY_CARE_PROVIDER_SITE_OTHER): Payer: 59 | Admitting: Family Medicine

## 2023-01-07 VITALS — BP 122/78 | HR 69 | Wt 210.0 lb

## 2023-01-07 DIAGNOSIS — L03115 Cellulitis of right lower limb: Secondary | ICD-10-CM | POA: Diagnosis not present

## 2023-01-07 LAB — BASIC METABOLIC PANEL
BUN/Creatinine Ratio: 14 (ref 10–24)
BUN: 16 mg/dL (ref 8–27)
CO2: 20 mmol/L (ref 20–29)
Calcium: 9.2 mg/dL (ref 8.6–10.2)
Chloride: 104 mmol/L (ref 96–106)
Creatinine, Ser: 1.14 mg/dL (ref 0.76–1.27)
Glucose: 136 mg/dL — ABNORMAL HIGH (ref 70–99)
Potassium: 4.6 mmol/L (ref 3.5–5.2)
Sodium: 142 mmol/L (ref 134–144)
eGFR: 70 mL/min/{1.73_m2} (ref >59)

## 2023-01-07 LAB — CBC WITH DIFFERENTIAL/PLATELET
Basophils Absolute: 0 10*3/uL (ref 0.0–0.2)
Basos: 0 %
EOS (ABSOLUTE): 0.2 10*3/uL (ref 0.0–0.4)
Eos: 2 %
Hematocrit: 47.5 % (ref 37.5–51.0)
Hemoglobin: 16.2 g/dL (ref 13.0–17.7)
Immature Grans (Abs): 0 10*3/uL (ref 0.0–0.1)
Immature Granulocytes: 1 %
Lymphocytes Absolute: 1.5 10*3/uL (ref 0.7–3.1)
Lymphs: 18 %
MCH: 31.8 pg (ref 26.6–33.0)
MCHC: 34.1 g/dL (ref 31.5–35.7)
MCV: 93 fL (ref 79–97)
Monocytes Absolute: 0.7 10*3/uL (ref 0.1–0.9)
Monocytes: 9 %
Neutrophils Absolute: 5.8 10*3/uL (ref 1.4–7.0)
Neutrophils: 70 %
Platelets: 116 10*3/uL — ABNORMAL LOW (ref 150–450)
RBC: 5.09 x10E6/uL (ref 4.14–5.80)
RDW: 13.6 % (ref 11.6–15.4)
WBC: 8.3 10*3/uL (ref 3.4–10.8)

## 2023-01-07 LAB — PSA: Prostate Specific Ag, Serum: 1.1 ng/mL (ref 0.0–4.0)

## 2023-01-07 MED ORDER — AMOXICILLIN-POT CLAVULANATE 875-125 MG PO TABS
1.0000 | ORAL_TABLET | Freq: Two times a day (BID) | ORAL | 0 refills | Status: DC
Start: 2023-01-07 — End: 2023-01-14

## 2023-01-07 NOTE — Progress Notes (Signed)
   Subjective:    Patient ID: Jim [name], male    DOB: 1955/10/12, 67 y.o.   MRN: [id number]  HPI Patient arrives today for 2 week follow up. Patient states right leg has two spots that are swollen.  This this patient has had recent surgery for the leg vascular surgery we saw him approximately 2 weeks ago when he had mild cellulitis he has finished up the antibiotics the proximal area of his low surgical scar does have some exudate and drainage and it with localized cellulitis the wound is not dehisced he denies running any fevers  Review of Systems     Objective:   Physical Exam  Upper leg no cellulitis noted upper portion of the laceration does have a little bit of drainage and some redness slight tenderness lower leg nontender      Assessment & Plan:  1. Cellulitis of right lower extremity Leg cellulitis Wound culture Augmentin twice daily for the next 10 days If ongoing troubles follow-up  - WOUND CULTURE  Wound: Was taken Await the results Will send message to the surgeon so that their office can connect with the patient and let him know when they do follow-up because he was uncertain when to follow-up with them  Staff message was sent

## 2023-01-09 LAB — WOUND CULTURE

## 2023-01-10 LAB — WOUND CULTURE

## 2023-01-10 LAB — SPECIMEN STATUS REPORT

## 2023-01-13 ENCOUNTER — Ambulatory Visit (INDEPENDENT_AMBULATORY_CARE_PROVIDER_SITE_OTHER): Payer: 59 | Admitting: Physician Assistant

## 2023-01-13 VITALS — BP 112/75 | HR 85 | Temp 97.9°F | Resp 20 | Ht 68.0 in | Wt 211.0 lb

## 2023-01-13 DIAGNOSIS — I70221 Atherosclerosis of native arteries of extremities with rest pain, right leg: Secondary | ICD-10-CM

## 2023-01-13 NOTE — Progress Notes (Signed)
POST OPERATIVE OFFICE NOTE    CC:  F/u for surgery  HPI:  This is a 67 y.o. male who is s/p right femoral to posterior tibial artery bypass using vein by Dr. Karin Lieu on 12/13/2022 due to critical limb ischemia with rest pain of the right foot.  Patient reports complete resolution of the rest pain in his right foot since surgery.  He seems to be ambulating well.  He believes his incisions are healing well except for one vein harvest incision around the level of the knee.  He was seen by his PCP recently who prescribed him Augmentin.  This however has been giving him an upset stomach with nausea.  He is on aspirin and statin daily.  Allergies  Allergen Reactions   Doxycycline Nausea Only    PATIENT STILL TOLERATES   Pravastatin Other (See Comments)    myalgia    Current Outpatient Medications  Medication Sig Dispense Refill   acetaminophen (TYLENOL) 500 MG tablet Take 500-1,000 mg by mouth every 6 (six) hours as needed (pain.).     albuterol (VENTOLIN HFA) 108 (90 Base) MCG/ACT inhaler Inhale 2 puffs into the lungs every 6 (six) hours as needed for wheezing. 1 each 6   amoxicillin-clavulanate (AUGMENTIN) 875-125 MG tablet Take 1 tablet by mouth 2 (two) times daily. 28 tablet 0   aspirin EC 81 MG tablet Take 1 tablet (81 mg total) by mouth daily. Swallow whole. 150 tablet 2   blood glucose meter kit and supplies Dispense based on patient and insurance preference. Use to check glucose once daily as directed. (FOR ICD-10 E11.9). 1 each 0   buPROPion (WELLBUTRIN XL) 150 MG 24 hr tablet TAKE 1 TABLET BY MOUTH EVERY DAY 90 tablet 1   carvedilol (COREG) 3.125 MG tablet TAKE 1 TABLET BY MOUTH TWICE A DAY WITH A MEAL 180 tablet 1   clopidogrel (PLAVIX) 75 MG tablet TAKE 1 TABLET BY MOUTH EVERY DAY 90 tablet 1   empagliflozin (JARDIANCE) 10 MG TABS tablet Take 1 tablet (10 mg total) by mouth daily before breakfast. 90 tablet 1   ezetimibe (ZETIA) 10 MG tablet TAKE 1 TABLET BY MOUTH EVERY DAY (Patient  taking differently: Take 10 mg by mouth at bedtime.) 90 tablet 1   FLUoxetine (PROZAC) 20 MG capsule TAKE 3 CAPSULES BY MOUTH EVERY DAY (Patient taking differently: Take 60 mg by mouth at bedtime.) 270 capsule 1   gabapentin (NEURONTIN) 400 MG capsule Take 400 mg by mouth at bedtime.     glipiZIDE (GLUCOTROL XL) 2.5 MG 24 hr tablet Take 2.5 mg by mouth in the morning.     Lancets (ONETOUCH ULTRASOFT) lancets      metFORMIN (GLUCOPHAGE) 500 MG tablet TAKE 2 TABLET BY MOUTH EVERY MORNING AND EVENING     naloxone (NARCAN) nasal spray 4 mg/0.1 mL Use as directed 1 each 0   ONETOUCH VERIO test strip USE TO CHECK BLOOD SUGAR LEVELS ONCE DAILY 50 strip 0   Oxycodone HCl 20 MG TABS Take 20 mg by mouth every 4 (four) hours as needed (pain.). Take 1 tablet by mouth 4-5 times daily if tolerated     pantoprazole (PROTONIX) 40 MG tablet Take 1 tablet (40 mg total) by mouth 2 (two) times daily as needed (indigestion/heartburn.).     rosuvastatin (CRESTOR) 5 MG tablet TAKE 1 TABLET ON MONDAY AND FRIDAY 24 tablet 3   tamsulosin (FLOMAX) 0.4 MG CAPS capsule TAKE 1 CAPSULE BY MOUTH EVERY DAY 90 capsule 1  traZODone (DESYREL) 50 MG tablet TAKE 1 TABLET BY MOUTH AT BEDTIME AS NEEDED FOR INSOMNIA (Patient taking differently: Take 50 mg by mouth at bedtime.) 90 tablet 1   No current facility-administered medications for this visit.     ROS:  See HPI  Physical Exam:  Vitals:   01/13/23 1505  BP: 112/75  Pulse: 85  Resp: 20  Temp: 97.9 F (36.6 C)  SpO2: 93%  Weight: 211 lb (95.7 kg)  Height: 5\' 8"  (1.727 m)    Incision: Right groin incision healed with small separation at the proximal pole seemingly superficial without drainage or purulence; small vein harvest incision around the level of the knee with small area of separation seemingly superficial without purulence; all other incisions healing well Extremities: Palpable PT pulse; edema around the level of the ankle Neuro: A&O  Assessment/Plan:  This  is a 67 y.o. male who is s/p: Right femoral to posterior tibial artery bypass with vein due to critical limb ischemia and rest pain of the right lower extremity  -Subjectively rest pain of the right foot has resolved since surgery.  On exam he has a palpable PT pulse suggesting a widely patent bypass graft.  He does have a small area of superficial dehiscence of his groin incision at the proximal pole.  There is also a small area of dehiscence in a vein harvest incision at the level of the knee.  I encouraged the patient and his wife to wash these on a daily basis with antibacterial soap and water then pat dry.  He should also use a piece of dry gauze without tape in the groin fold to wick moisture from the incision.  I will switch his Augmentin to Keflex due to side effect profile.  He will continue to walk is much as possible.  He should elevate his leg when he is resting during the day.  He can use the TED hose from his hospital stay as well to help with managing edema.  He will continue his aspirin, Plavix, statin daily.  We will check ABIs and right leg bypass duplex in about 3 to 4 weeks.  He will come back sooner should his incisions worsen or fail to heal.  He will call sooner with any questions or concerns.   Emilie Rutter, PA-C [facility] [phone]  Clinic MD:  Chestine Spore on call

## 2023-01-14 MED ORDER — DOXYCYCLINE HYCLATE 100 MG PO CAPS: 100.0000 mg | ORAL_CAPSULE | Freq: Two times a day (BID) | ORAL | 0 refills | Status: DC

## 2023-01-14 NOTE — Addendum Note (Signed)
Addended byEmilie Rutter on: 01/14/2023 08:03 AM   Modules accepted: Orders

## 2023-01-18 DIAGNOSIS — G5602 Carpal tunnel syndrome, left upper limb: Secondary | ICD-10-CM | POA: Diagnosis not present

## 2023-01-18 DIAGNOSIS — M5030 Other cervical disc degeneration, unspecified cervical region: Secondary | ICD-10-CM | POA: Diagnosis not present

## 2023-01-26 ENCOUNTER — Other Ambulatory Visit: Payer: Self-pay

## 2023-01-26 DIAGNOSIS — I70213 Atherosclerosis of native arteries of extremities with intermittent claudication, bilateral legs: Secondary | ICD-10-CM

## 2023-01-26 DIAGNOSIS — I739 Peripheral vascular disease, unspecified: Secondary | ICD-10-CM

## 2023-01-26 DIAGNOSIS — I70221 Atherosclerosis of native arteries of extremities with rest pain, right leg: Secondary | ICD-10-CM

## 2023-02-14 ENCOUNTER — Telehealth: Payer: Self-pay | Admitting: Family Medicine

## 2023-02-14 NOTE — Telephone Encounter (Signed)
Patient is wanting to know how long does he need to be off his plavix because he needing to get tooth pulled. Please advise

## 2023-02-15 ENCOUNTER — Encounter: Payer: Self-pay | Admitting: Family Medicine

## 2023-02-15 ENCOUNTER — Ambulatory Visit (HOSPITAL_COMMUNITY)
Admission: RE | Admit: 2023-02-15 | Discharge: 2023-02-15 | Disposition: A | Discharge status: Home / Self Care | Payer: 59 | Source: Ambulatory Visit | Attending: Family Medicine | Admitting: Family Medicine

## 2023-02-15 DIAGNOSIS — I739 Peripheral vascular disease, unspecified: Secondary | ICD-10-CM | POA: Insufficient documentation

## 2023-02-15 DIAGNOSIS — R0609 Other forms of dyspnea: Secondary | ICD-10-CM | POA: Insufficient documentation

## 2023-02-15 DIAGNOSIS — I10 Essential (primary) hypertension: Secondary | ICD-10-CM | POA: Insufficient documentation

## 2023-02-15 LAB — ECHOCARDIOGRAM COMPLETE
Area-P 1/2: 3.85 cm2
S' Lateral: 2.4 cm

## 2023-02-15 NOTE — Progress Notes (Signed)
*  PRELIMINARY RESULTS* Echocardiogram 2D Echocardiogram has been performed.  Stacey Drain 02/15/2023, 10:07 AM

## 2023-02-15 NOTE — Telephone Encounter (Signed)
I sent them a MyChart message they had sent a message earlier thanks

## 2023-02-17 ENCOUNTER — Other Ambulatory Visit: Payer: Self-pay

## 2023-02-17 ENCOUNTER — Telehealth: Payer: Self-pay

## 2023-02-17 DIAGNOSIS — I739 Peripheral vascular disease, unspecified: Secondary | ICD-10-CM

## 2023-02-17 NOTE — Telephone Encounter (Signed)
Called pt to discuss results, no answer left messages for callback. Referral has been put in for cardiologist

## 2023-02-18 ENCOUNTER — Ambulatory Visit (HOSPITAL_COMMUNITY): Payer: 59

## 2023-02-18 ENCOUNTER — Ambulatory Visit: Payer: 59

## 2023-02-18 ENCOUNTER — Other Ambulatory Visit (INDEPENDENT_AMBULATORY_CARE_PROVIDER_SITE_OTHER): Payer: Self-pay | Admitting: *Deleted

## 2023-02-18 DIAGNOSIS — K7460 Unspecified cirrhosis of liver: Secondary | ICD-10-CM

## 2023-02-21 DIAGNOSIS — G5602 Carpal tunnel syndrome, left upper limb: Secondary | ICD-10-CM | POA: Diagnosis not present

## 2023-02-21 DIAGNOSIS — G894 Chronic pain syndrome: Secondary | ICD-10-CM | POA: Diagnosis not present

## 2023-02-21 DIAGNOSIS — M47816 Spondylosis without myelopathy or radiculopathy, lumbar region: Secondary | ICD-10-CM | POA: Diagnosis not present

## 2023-02-23 ENCOUNTER — Encounter: Payer: Self-pay | Admitting: Cardiology

## 2023-02-23 ENCOUNTER — Ambulatory Visit: Payer: 59 | Admitting: Cardiology

## 2023-02-23 VITALS — BP 118/68 | HR 84 | Resp 16 | Ht 68.0 in | Wt 220.4 lb

## 2023-02-23 DIAGNOSIS — E782 Mixed hyperlipidemia: Secondary | ICD-10-CM | POA: Diagnosis not present

## 2023-02-23 DIAGNOSIS — E119 Type 2 diabetes mellitus without complications: Secondary | ICD-10-CM | POA: Diagnosis not present

## 2023-02-23 DIAGNOSIS — I700 Atherosclerosis of aorta: Secondary | ICD-10-CM

## 2023-02-23 DIAGNOSIS — F17210 Nicotine dependence, cigarettes, uncomplicated: Secondary | ICD-10-CM | POA: Diagnosis not present

## 2023-02-23 DIAGNOSIS — I739 Peripheral vascular disease, unspecified: Secondary | ICD-10-CM

## 2023-02-23 DIAGNOSIS — F17200 Nicotine dependence, unspecified, uncomplicated: Secondary | ICD-10-CM

## 2023-02-23 MED ORDER — ROSUVASTATIN CALCIUM 5 MG PO TABS
5.0000 mg | ORAL_TABLET | Freq: Every day | ORAL | 0 refills | Status: DC
Start: 2023-02-23 — End: 2023-06-16

## 2023-02-23 NOTE — Progress Notes (Deleted)
GI Office Note    Referring Provider: Babs Sciara, MD Primary Care Physician:  Babs Sciara, MD Primary Gastroenterologist: Gerrit Friends.Rourk, MD  Date:  02/23/2023  ID:  [name], DOB 15-Aug-1955, MRN [id number]   Chief Complaint   No chief complaint on file.  History of Present Illness  Jim [name] is a 67 y.o. male with a history of *** presenting today with complaint of   EGD in 2002: Esophageal ulcer versus tear   Colonoscopy 2013: -Multiple adenomatous colon polyps -Advised repeat in 3 years   Seen by our GI service at [facility] 07/08/2022.  Patient presented with upper abdominal pain just below the sternum for 2 days.  Reported this is similar to when he had an esophageal ulcer in the past.  Also with dysphagia for 2 weeks.  Denied frequent NSAID use.  Denied any troubles with constipation, melena, or BRBPR.  Has Cologuard to complete at home.  Was due for surveillance colonoscopy in 2016.  Has history of OSA but does not use CPAP at home.  Had quit drinking alcohol in the 1970s/1980s.  Had labs in the ED with AST elevated at 48, lipase 35, hemoglobin 17.6, WBC 6000, platelets 105.  Had improvement the next day with normal LFTs, hemoglobin 15.5, and platelets decreased to 82,000.  He underwent CT A/P as noted below.  He was treated with IV PPI twice daily and underwent EGD 07/08/2022 as outlined below.   CT A/P with contrast 07/07/22: -Subtle nodularity of liver contour raising question for possible cirrhosis -Splenomegaly -Mild lymphadenopathy in the hepatoduodenal ligament likely reactive (follow-up CT in 3 months recommended) -Mild dilated appendix up to 2 mm in diameter without edema or inflammation   EGD 07/08/2022: -Grade 1/2 esophageal varices -Erythematous mucosa in the antrum s/p biopsy (mild chronic gastritis) -Normal duodenum -Advised PPI twice daily -Consider stopping hydrochlorothiazide and start carvedilol 3.125 mg twice daily  Negative Cologuard  in December 2023. Previously declined colonoscopy given negative Cologuard.   Prior workup with negative autoimmune serologies and negative viral hepatitis. Mildly elevated ALT in December 2023.  Last office visit 08/26/22. ** Continue PPI and NSBB, RUQ [place] and labs in 6 months. F/u in 6 months.   Today: Cirrhosis history Hematemesis/coffee ground emesis: *** History of variceal bleeding: *** Abdominal pain: *** Abdominal distention/worsening ascites: *** Fever/chills: *** Episodes of confusion/disorientation: *** Number of daily bowel movements: *** Taking diuretics?: *** Date of last EGD: 07/08/22 - grade 1/2 varices Prior history of banding?: no Prior episodes of SBP: *** Last time liver imaging was performed: December 2023  MELD 3.0: 10 at 12/14/2022  1:51 AM MELD-Na: 6 at 12/14/2022  1:51 AM Calculated from: Serum Creatinine: 0.99 mg/dL (Using min of 1 mg/dL) at 11/08/8117  1:47 AM Serum Sodium: 132 mmol/L at 12/14/2022  1:51 AM Total Bilirubin: 0.8 mg/dL (Using min of 1 mg/dL) at 03/30/5620  3:08 AM Serum Albumin: 3.9 g/dL (Using max of 3.5 g/dL) at 6/57/8469  6:29 AM INR(ratio): 1.0 at 12/13/2022  9:34 AM Age at listing (hypothetical): 69 years Sex: Male at 12/14/2022  1:51 AM     Current Outpatient Medications  Medication Sig Dispense Refill   acetaminophen (TYLENOL) 500 MG tablet Take 500-1,000 mg by mouth every 6 (six) hours as needed (pain.).     albuterol (VENTOLIN HFA) 108 (90 Base) MCG/ACT inhaler Inhale 2 puffs into the lungs every 6 (six) hours as needed for wheezing. 1 each 6   aspirin EC 81  MG tablet Take 1 tablet (81 mg total) by mouth daily. Swallow whole. 150 tablet 2   blood glucose meter kit and supplies Dispense based on patient and insurance preference. Use to check glucose once daily as directed. (FOR ICD-10 E11.9). 1 each 0   buPROPion (WELLBUTRIN XL) 150 MG 24 hr tablet TAKE 1 TABLET BY MOUTH EVERY DAY 90 tablet 1   clopidogrel (PLAVIX) 75 MG tablet TAKE 1  TABLET BY MOUTH EVERY DAY 90 tablet 1   empagliflozin (JARDIANCE) 10 MG TABS tablet Take 1 tablet (10 mg total) by mouth daily before breakfast. 90 tablet 1   ezetimibe (ZETIA) 10 MG tablet TAKE 1 TABLET BY MOUTH EVERY DAY (Patient taking differently: Take 10 mg by mouth at bedtime.) 90 tablet 1   FLUoxetine (PROZAC) 20 MG capsule TAKE 3 CAPSULES BY MOUTH EVERY DAY (Patient taking differently: Take 60 mg by mouth at bedtime.) 270 capsule 1   gabapentin (NEURONTIN) 400 MG capsule Take 400 mg by mouth at bedtime.     glipiZIDE (GLUCOTROL XL) 2.5 MG 24 hr tablet Take 2.5 mg by mouth in the morning.     Lancets (ONETOUCH ULTRASOFT) lancets      metFORMIN (GLUCOPHAGE) 500 MG tablet TAKE 2 TABLET BY MOUTH EVERY MORNING AND EVENING     naloxone (NARCAN) nasal spray 4 mg/0.1 mL Use as directed 1 each 0   ONETOUCH VERIO test strip USE TO CHECK BLOOD SUGAR LEVELS ONCE DAILY 50 strip 0   Oxycodone HCl 20 MG TABS Take 20 mg by mouth every 4 (four) hours as needed (pain.). Take 1 tablet by mouth 4-5 times daily if tolerated     rosuvastatin (CRESTOR) 5 MG tablet Take 1 tablet (5 mg total) by mouth at bedtime. 90 tablet 0   traZODone (DESYREL) 50 MG tablet TAKE 1 TABLET BY MOUTH AT BEDTIME AS NEEDED FOR INSOMNIA (Patient taking differently: Take 50 mg by mouth at bedtime.) 90 tablet 1   No current facility-administered medications for this visit.    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 Left > Right   Esophageal ulcer    Ex-smoker    Quit 11/07/2011   Gastric ulcer    GERD (gastroesophageal reflux disease)    High cholesterol    History of colon polyps    Hyperlipidemia    Hypertension    Primary localized osteoarthritis of right knee    PVD (peripheral vascular disease) with claudication (HCC) 05/21/2018    Under the care of vascular surgery   Sleep apnea     Past Surgical History:  Procedure Laterality Date   ABDOMINAL AORTOGRAM W/LOWER EXTREMITY N/A 05/19/2018   Procedure: ABDOMINAL AORTOGRAM W/LOWER EXTREMITY;  Surgeon: Sherren Kerns, MD;  Location: [facility] INVASIVE CV LAB;  Service: Cardiovascular;  Laterality: N/A;   ABDOMINAL AORTOGRAM W/LOWER EXTREMITY Right 12/08/2022   Procedure: ABDOMINAL AORTOGRAM W/LOWER EXTREMITY;  Surgeon: [name], MD;  Location: [facility] INVASIVE CV LAB;  Service: Cardiovascular;  Laterality: Right;   BIOPSY  07/08/2022   Procedure: BIOPSY;  Surgeon: Dolores Frame, MD;  Location: [facility] ENDO SUITE;  Service: Gastroenterology;;   CARPAL TUNNEL RELEASE     right   CARPAL TUNNEL RELEASE Left 05/16/13   Dr  Wainer   COLONOSCOPY  01/31/2012   Procedure: COLONOSCOPY;  Surgeon: Corbin Ade, MD;  Location: [facility] ENDO SU[facility];  Service: Endoscopy;  Laterality: N/A;  7:30 AM   ESOPHAGEAL DILATION N/A 07/08/2022   Procedure: ESOPHAGEAL DILATION;  Surgeon: Dolores Frame, MD;  Location: [facility];  Service: Gastroenterology;  Laterality: N/A;   ESOPHAGOGASTRODUODENOSCOPY (EGD) WITH PROPOFOL N/A 07/08/2022   Procedure: ESOPHAGOGASTRODUODENOSCOPY (EGD) WITH PROPOFOL;  Surgeon: Dolores Frame, MD;  Location: [facility];  Service: Gastroenterology;  Laterality: N/A;   FEMORAL-TIBIAL BYPASS GRAFT Right 12/13/2022   Procedure: RIGHT COMMON FEMORAL-POSTERIOR TIBIAL ARTERY BYPASS WITH VEIN;  Surgeon: [name], MD;  Location: [facility] OR;  Service: Vascular;  Laterality: Right;   FINGER AMPUTATION     partial left 5th finger   JOINT REPLACEMENT     KNEE ARTHROSCOPY     right knee   ORIF FINGER FRACTURE     Right 5th finger, Amputation reatachment   PERIPHERAL VASCULAR INTERVENTION Right 05/19/2018   Procedure: PERIPHERAL VASCULAR INTERVENTION;  Surgeon: Sherren Kerns, MD;  Location: [facility] INVASIVE CV LAB;  Service: Cardiovascular;  Laterality:  Right;  right popliteal   TONSILLECTOMY     TOTAL KNEE ARTHROPLASTY  09/04/2012   Procedure: TOTAL KNEE ARTHROPLASTY;  Surgeon: Nilda Simmer, MD;  Location: [facility] OR;  Service: Orthopedics;  Laterality: Left;   TOTAL KNEE ARTHROPLASTY Right 07/21/2015   Procedure: RIGHT TOTAL KNEE ARTHROPLASTY;  Surgeon: Salvatore Marvel, MD;  Location: [facility] OR;  Service: Orthopedics;  Laterality: Right;    Family History  Problem Relation Age of Onset   Heart disease Mother    Heart disease Father    Diabetes type I Father    Hypertension Father    Cancer Father    Heart disease Sister    Heart disease Brother     Allergies as of 02/24/2023 - Review Complete 02/23/2023  Allergen Reaction Noted   Doxycycline Nausea Only 10/23/2012   Pravastatin Other (See Comments) 10/23/2012    Social History   Socioeconomic History   Marital status: Married    Spouse name: Not on file   Number of children: Not on file   Years of education: Not on file   Highest education level: 12th grade  Occupational History   Occupation: disabled    Employer: OTHER  Tobacco Use   Smoking status: Every Day    Current packs/day: 1.00    Average packs/day: 1 pack/day for 40.0 years (40.0 ttl pk-yrs)    Types: Cigarettes    Passive exposure: Current   Smokeless tobacco: Never  Vaping Use   Vaping status: Never Used  Substance and Sexual Activity   Alcohol use: Not Currently   Drug use: No   Sexual activity: Yes  Other Topics Concern   Not on file  Social History Narrative   Not on file   Social Determinants of Health   Financial Resource Strain: Patient Declined (11/01/2022)   Overall Financial Resource Strain (CARDIA)    Difficulty of Paying Living Expenses: Patient declined  Food Insecurity: No Food Insecurity (12/21/2022)   Hunger Vital Sign    Worried About Running Out of Food in the Last Year: Never true    Ran Out of Food in the Last Year: Never true  Recent Concern: Food Insecurity - Food Insecurity  Present (11/01/2022)   Hunger Vital Sign    Worried About Running Out of Food in the Last Year: Sometimes true    Ran Out  of Food in the Last Year: Never true  Transportation Needs: No Transportation Needs (12/21/2022)   PRAPARE - Administrator, Civil Service (Medical): No    Lack of Transportation (Non-Medical): No  Physical Activity: Inactive (11/01/2022)   Exercise Vital Sign    Days of Exercise per Week: 0 days    Minutes of Exercise per Session: 20 min  Stress: No Stress Concern Present (11/01/2022)   [employer] of Occupational Health - Occupational Stress Questionnaire    Feeling of Stress : Only a little  Social Connections: Moderately Isolated (11/01/2022)   Social Connection and Isolation Panel [NHANES]    Frequency of Communication with Friends and Family: More than three times a week    Frequency of Social Gatherings with Friends and Family: More than three times a week    Attends Religious Services: Never    Database administrator or Organizations: No    Attends Banker Meetings: Never    Marital Status: Married     Review of Systems   Gen: Denies fever, chills, anorexia. Denies fatigue, weakness, weight loss.  CV: Denies chest pain, palpitations, syncope, peripheral edema, and claudication. Resp: Denies dyspnea at rest, cough, wheezing, coughing up blood, and pleurisy. GI: See HPI Derm: Denies rash, itching, dry skin Psych: Denies depression, anxiety, memory loss, confusion. No homicidal or suicidal ideation.  Heme: Denies bruising, bleeding, and enlarged lymph nodes.   Physical Exam   There were no vitals taken for this visit.  General:   Alert and oriented. No distress noted. Pleasant and cooperative.  Head:  Normocephalic and atraumatic. Eyes:  Conjuctiva clear without scleral icterus. Mouth:  Oral mucosa pink and moist. Good dentition. No lesions. Lungs:  Clear to auscultation bilaterally. No wheezes, rales, or rhonchi. No distress.   Heart:  S1, S2 present without murmurs appreciated.  Abdomen:  +BS, soft, non-tender and non-distended. No rebound or guarding. No HSM or masses noted. Rectal: *** Msk:  Symmetrical without gross deformities. Normal posture. Extremities:  Without edema. Neurologic:  Alert and  oriented x4 Psych:  Alert and cooperative. Normal mood and affect.   Assessment  Jim [name] is a 67 y.o. male with a history of arthritis, HTN, HLD, diabetes, PVD/PAD, chronic pain syndrome, esophageal ulcer in 2002, and cirrhosis presenting today for follow up of cirrhosis and GERD.   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 Will check CT A/P  for hepatoma screening and to reevaluate mild lymphadenopathy in the hepatoduodenal ligament noted on previous imaging.   GERD, gastritis:   PLAN   *** CT A/P MELD labs with AFP in November 2024 2g sodium diet, high protein diet Continue carvedilol 3.125 mg BID Continue pantoprazole 40 mg BID Follow up in 6 months    Brooke Bonito, MSN, FNP-BC, AGACNP-BC [facility]

## 2023-02-23 NOTE — Progress Notes (Signed)
ID:  Jim [name], DOB [birth year], MRN [id number]  PCP:  Babs Sciara, MD  Cardiologist:  Tessa Lerner, DO, [facility] (established care 02/23/23) Vascular and vein specialist: Dr. Gerarda Fraction.   REASON FOR CONSULT: Evaluation for CAD - hx of severe PAD  REQUESTING PHYSICIAN:  Babs Sciara, MD [address] [place],  [place] [address]  Chief Complaint  Patient presents with   New Patient (Initial Visit)    Evaluation for heart disease given his PAD    HPI  Jim [name] is a 67 y.o. Caucasian male who presents to the clinic for evaluation of heart disease given his PAD at the request of Luking, Jonna Coup, MD. His past medical history and cardiovascular risk factors include: History of right lower extremity critical limb ischemia with rest pain May 2024, noted to have occluded SFA with reconstitution at the popliteal artery, s/p Right leg Femoral to posterior tibial artery bypass using ipsilateral, non-reversed greater saphenous vein. Hyperlipidemia, diabetes mellitus type 2, cigarette smoking, aortic atherosclerosis.  Patient is accompanied by his wife [name] at today's office visit.  Patient has had an extensive history of peripheral artery disease and recently underwent right femoral to popliteal artery bypass.  Patient states that he had an echocardiogram at [facility] and was later referred to cardiology for further evaluation.  I suspect that he is here for evaluation of heart disease given his degree of PAD and multiple cardiovascular risk factors.  Clinically denies anginal chest pain or heart failure symptoms.  He does not participate in any structured exercise program or daily routine.  Continues to smoke 5 cigarettes/day.  But is very motivated to stop smoking completely.  FUNCTIONAL STATUS: No structured exercise program or daily routine.   CARDIAC DATABASE: EKG: February 23, 2023: Sinus rhythm, 85 bpm, without underlying ischemia or injury  pattern.  Echocardiogram: February 15, 2023:  1. Left ventricular ejection fraction, by estimation, is 60 to 65%. The  left ventricle has normal function. The left ventricle has no regional  wall motion abnormalities. Left ventricular diastolic parameters are  consistent with Grade I diastolic  dysfunction (impaired relaxation).   2. Right ventricular systolic function is normal. The right ventricular  size is normal.   3. The mitral valve is grossly normal. No evidence of mitral valve  regurgitation. No evidence of mitral stenosis.   4. The aortic valve has an indeterminant number of cusps. There is  moderate calcification of the aortic valve. Aortic valve regurgitation is  not visualized. No aortic stenosis is present.   5. The inferior vena cava is normal in size with greater than 50%  respiratory variability, suggesting right atrial pressure of 3 mmHg.    ALLERGIES: Allergies  Allergen Reactions   Doxycycline Nausea Only    PATIENT STILL TOLERATES   Pravastatin Other (See Comments)    myalgia    MEDICATION LIST PRIOR TO VISIT: Current Meds  Medication Sig   acetaminophen (TYLENOL) 500 MG tablet Take 500-1,000 mg by mouth every 6 (six) hours as needed (pain.).   albuterol (VENTOLIN HFA) 108 (90 Base) MCG/ACT inhaler Inhale 2 puffs into the lungs every 6 (six) hours as needed for wheezing.   aspirin EC 81 MG tablet Take 1 tablet (81 mg total) by mouth daily. Swallow whole.   blood glucose meter kit and supplies Dispense based on patient and insurance preference. Use to check glucose once daily as directed. (FOR ICD-10 E11.9).   buPROPion (WELLBUTRIN XL) 150 MG  24 hr tablet TAKE 1 TABLET BY MOUTH EVERY DAY   clopidogrel (PLAVIX) 75 MG tablet TAKE 1 TABLET BY MOUTH EVERY DAY   empagliflozin (JARDIANCE) 10 MG TABS tablet Take 1 tablet (10 mg total) by mouth daily before breakfast.   ezetimibe (ZETIA) 10 MG tablet TAKE 1 TABLET BY MOUTH EVERY DAY (Patient taking differently: Take 10  mg by mouth at bedtime.)   FLUoxetine (PROZAC) 20 MG capsule TAKE 3 CAPSULES BY MOUTH EVERY DAY (Patient taking differently: Take 60 mg by mouth at bedtime.)   gabapentin (NEURONTIN) 400 MG capsule Take 400 mg by mouth at bedtime.   glipiZIDE (GLUCOTROL XL) 2.5 MG 24 hr tablet Take 2.5 mg by mouth in the morning.   Lancets (ONETOUCH ULTRASOFT) lancets    metFORMIN (GLUCOPHAGE) 500 MG tablet TAKE 2 TABLET BY MOUTH EVERY MORNING AND EVENING   naloxone (NARCAN) nasal spray 4 mg/0.1 mL Use as directed   ONETOUCH VERIO test strip USE TO CHECK BLOOD SUGAR LEVELS ONCE DAILY   Oxycodone HCl 20 MG TABS Take 20 mg by mouth every 4 (four) hours as needed (pain.). Take 1 tablet by mouth 4-5 times daily if tolerated   traZODone (DESYREL) 50 MG tablet TAKE 1 TABLET BY MOUTH AT BEDTIME AS NEEDED FOR INSOMNIA (Patient taking differently: Take 50 mg by mouth at bedtime.)   [DISCONTINUED] rosuvastatin (CRESTOR) 5 MG tablet TAKE 1 TABLET ON MONDAY AND FRIDAY     PAST MEDICAL HISTORY: 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 Left > Right   Esophageal ulcer    Ex-smoker    Quit 11/07/2011   Gastric ulcer    GERD (gastroesophageal reflux disease)    High cholesterol    History of colon polyps    Hyperlipidemia    Hypertension    Primary localized osteoarthritis of right knee    PVD (peripheral vascular disease) with claudication (HCC) 05/21/2018   Under the care of vascular surgery   Sleep apnea     PAST SURGICAL HISTORY: Past Surgical History:  Procedure Laterality Date   ABDOMINAL AORTOGRAM W/LOWER EXTREMITY N/A 05/19/2018   Procedure: ABDOMINAL AORTOGRAM W/LOWER EXTREMITY;  Surgeon: Sherren Kerns, MD;  Location: [facility] INVASIVE CV LAB;  Service: Cardiovascular;  Laterality: N/A;   ABDOMINAL AORTOGRAM W/LOWER  EXTREMITY Right 12/08/2022   Procedure: ABDOMINAL AORTOGRAM W/LOWER EXTREMITY;  Surgeon: [name], MD;  Location[facility] INVASIVE CV LAB;  Service: Cardiovascular;  Laterality: Right;   BIOPSY  07/08/2022   Procedure: BIOPSY;  Surgeon: Dolores Frame, MD;  Location: [facility] ENDO SUITE;  Service: Gastroenterology;;   CARPAL TUNNEL RELEASE     right   CARPAL TUNNEL RELEASE Left 05/16/13   Dr Thurston Hole   COLONOSCOPY  01/31/2012   Procedure: COLONOSCOPY;  Surgeon: Corbin Ade, MD;  Location: [facility];  Service: Endoscopy;  Laterality: N/A;  7:30 AM   ESOPHAGEAL DILATION N/A 07/08/2022   Procedure: ESOPHAGEAL DILATION;  Surgeon: Dolores Frame, MD;  Location: [facility];  Service: Gastroenterology;  Laterality: N/A;   ESOPHAGOGASTRODUODENOSCOPY (EGD) WITH PROPOFOL N/A 07/08/2022   Procedure: ESOPHAGOGASTRODUODENOSCOPY (EGD) WITH PROPOFOL;  Surgeon: Dolores Frame, MD;  Location: [facility];  Service: Gastroenterology;  Laterality: N/A;   FEMORAL-TIBIAL  BYPASS GRAFT Right 12/13/2022   Procedure: RIGHT COMMON FEMORAL-POSTERIOR TIBIAL ARTERY BYPASS WITH VEIN;  Surgeon: [name], MD;  Location: [facility] OR;  Service: Vascular;  Laterality: Right;   FINGER AMPUTATION     partial left 5th finger   JOINT REPLACEMENT     KNEE ARTHROSCOPY     right knee   ORIF FINGER FRACTURE     Right 5th finger, Amputation reatachment   PERIPHERAL VASCULAR INTERVENTION Right 05/19/2018   Procedure: PERIPHERAL VASCULAR INTERVENTION;  Surgeon: Sherren Kerns, MD;  Location: [facility] INVASIVE CV LAB;  Service: Cardiovascular;  Laterality: Right;  right popliteal   TONSILLECTOMY     TOTAL KNEE ARTHROPLASTY  09/04/2012   Procedure: TOTAL KNEE ARTHROPLASTY;  Surgeon: Nilda Simmer, MD;  Location: [facility] OR;  Service: Orthopedics;  Laterality: Left;   TOTAL KNEE ARTHROPLASTY Right 07/21/2015   Procedure: RIGHT TOTAL KNEE ARTHROPLASTY;  Surgeon: Salvatore Marvel, MD;  Location: [facility] OR;  Service:  Orthopedics;  Laterality: Right;    FAMILY HISTORY: The patient family history includes Cancer in his father; Diabetes type I in his father; Heart disease in his brother, father, mother, and sister; Hypertension in his father.  SOCIAL HISTORY:  The patient  reports that he has been smoking cigarettes. He has a 40 pack-year smoking history. He has been exposed to tobacco smoke. He has never used smokeless tobacco. He reports that he does not currently use alcohol. He reports that he does not use drugs.  REVIEW OF SYSTEMS: Review of Systems  Cardiovascular:  Negative for chest pain, claudication, dyspnea on exertion, irregular heartbeat, leg swelling, near-syncope, orthopnea, palpitations, paroxysmal nocturnal dyspnea and syncope.  Respiratory:  Negative for shortness of breath.   Hematologic/Lymphatic: Negative for bleeding problem.  Musculoskeletal:  Negative for muscle cramps and myalgias.  Neurological:  Negative for dizziness and light-headedness.    PHYSICAL EXAM:    02/23/2023   10:44 AM 01/13/2023    3:05 PM 01/07/2023    9:26 AM  Vitals with BMI  Height 5\' 8"  5\' 8"    Weight 220 lbs 6 oz 211 lbs 210 lbs  BMI 33.52 32.09 31.94  Systolic 118 112 098  Diastolic 68 75 78  Pulse 84 85 69    Physical Exam  Constitutional: No distress. He appears chronically ill.  hemodynamically stable, walks w/ cane.   HENT:  Poor dentition  Neck: No JVD present.  Cardiovascular: Normal rate, regular rhythm, S1 normal, S2 normal and intact distal pulses. Exam reveals no gallop, no S3 and no S4.  Murmur heard. Systolic murmur is present with a grade of 3/6 at the upper right sternal border. Pulses:      Dorsalis pedis pulses are 0 on the right side and 0 on the left side.       Posterior tibial pulses are 0 on the right side and 0 on the left side.  Pulmonary/Chest: Effort normal and breath sounds normal. No stridor. He has no wheezes. He has no rales.  Abdominal: Soft. Bowel sounds are  normal. He exhibits no distension. There is no abdominal tenderness.  Musculoskeletal:        General: No edema.     Cervical back: Neck supple.     Comments: Medial aspect of the right leg -surgical scar is healing but serosanguineous fluid noted minimally, mild erythema, without pus or bleeding.  Warm to touch bilaterally, DP PT pulses are difficult to palpate.   Neurological: He is alert and oriented to person, place,  and time. He has intact cranial nerves (2-12).  Skin: Skin is warm and moist.     LABORATORY DATA:    Latest Ref Rng & Units 01/06/2023    2:23 PM 12/16/2022    8:37 AM 12/14/2022    1:51 AM  CBC  WBC 3.4 - 10.8 x10E3/uL 8.3  7.1  11.3   Hemoglobin 13.0 - 17.7 g/dL 32.4  40.1  02.7   Hematocrit 37.5 - 51.0 % 47.5  38.8  43.4   Platelets 150 - 450 x10E3/uL 116  88  100        Latest Ref Rng & Units 01/06/2023    2:23 PM 12/14/2022    1:51 AM 12/13/2022    9:34 AM  CMP  Glucose 70 - 99 mg/dL 253  664  403   BUN 8 - 27 mg/dL 16  17  18    Creatinine 0.76 - 1.27 mg/dL 4.74  2.59  5.63   Sodium 134 - 144 mmol/L 142  132  136   Potassium 3.5 - 5.2 mmol/L 4.6  4.0  4.5   Chloride 96 - 106 mmol/L 104  103  106   CO2 20 - 29 mmol/L 20  22  22    Calcium 8.6 - 10.2 mg/dL 9.2  8.1  8.9   Total Protein 6.5 - 8.1 g/dL   6.5   Total Bilirubin 0.3 - 1.2 mg/dL   0.8   Alkaline Phos 38 - 126 U/L   62   AST 15 - 41 U/L   22   ALT 0 - 44 U/L   40     Lab Results  Component Value Date   CHOL 181 11/01/2022   HDL 49 11/01/2022   LDLCALC 99 11/01/2022   TRIG 191 (H) 11/01/2022   CHOLHDL 3.7 11/01/2022   No components found for: "NTPROBNP" No results for input(s): "PROBNP" in the last 8760 hours. No results for input(s): "TSH" in the last 8760 hours.  BMP Recent Labs    07/09/22 0503 07/15/22 1349 12/13/22 0934 12/14/22 0151 01/06/23 1423  NA 136   < > 136 132* 142  K 3.9   < > 4.5 4.0 4.6  CL 105   < > 106 103 104  CO2 24   < > 22 22 20   GLUCOSE 127*   < > 215*  301* 136*  BUN 12   < > 18 17 16   CREATININE 0.77   < > 0.95 0.99 1.14  CALCIUM 8.4*   < > 8.9 8.1* 9.2  GFRNONAA >60  --  >60 >60  --    < > = values in this interval not displayed.    HEMOGLOBIN A1C Lab Results  Component Value Date   HGBA1C 7.6 (H) 11/01/2022   MPG 134 07/23/2015    IMPRESSION:    ICD-10-CM   1. PAD (peripheral artery disease) (HCC)  I73.9 EKG 12-Lead    PCV MYOCARDIAL PERFUSION WITH LEXISCAN    Lipid Panel With LDL/HDL Ratio    LDL cholesterol, direct    CMP14+EGFR    2. Mixed hyperlipidemia  E78.2 rosuvastatin (CRESTOR) 5 MG tablet    CT CARDIAC SCORING (SELF PAY ONLY)    Lipid Panel With LDL/HDL Ratio    LDL cholesterol, direct    CMP14+EGFR    3. Non-insulin dependent type 2 diabetes mellitus (HCC)  E11.9 CT CARDIAC SCORING (SELF PAY ONLY)    Lipid Panel With LDL/HDL Ratio    LDL cholesterol, direct  CMP14+EGFR    4. Atherosclerosis of aorta (HCC)  I70.0 CT CARDIAC SCORING (SELF PAY ONLY)    PCV MYOCARDIAL PERFUSION WITH LEXISCAN    Lipid Panel With LDL/HDL Ratio    LDL cholesterol, direct    CMP14+EGFR    5. Smoking  F17.200        RECOMMENDATIONS: Jim [name] is a 67 y.o. Caucasian male whose past medical history and cardiac risk factors include: History of right lower extremity critical limb ischemia with rest pain May 2024, noted to have occluded SFA with reconstitution at the popliteal artery, s/p Right leg Femoral to posterior tibial artery bypass using ipsilateral, non-reversed greater saphenous vein. Hyperlipidemia, diabetes mellitus type 2, cigarette smoking, aortic atherosclerosis.  PAD (peripheral artery disease) (HCC) Given the degree of PAD status post femoropopliteal bypass patient was referred to the practice for evaluation of heart disease. He has multiple cardiovascular risk factors that predispose him to CAD. He denies any anginal chest pain or heart failure symptoms.   Given the recent surgery, PAD, and ambulation  with cane overall functional capacity is limited to comment on exertional symptoms. Coronary calcium score for further risk stratification. Pharmacological stress to evaluate for reversible ischemia.  EKG is interpretable patient is unable to exercise. Reemphasized importance of complete smoking cessation. Currently on rosuvastatin 5 mg twice a week.  Will change it to daily with repeat fasting lipids in 6 weeks.  Reemphasized importance of glycemic control. Based on the results of the coronary calcium score and stress test consider Ozempic or other GLP-1 agonist to help facilitate weight loss. I have asked the patient to reach out to Dr. Sherral Hammers with regards to their questions for dual antiplatelet therapy and wound healing.   Images of his legs currently uploaded under media section.  Mixed hyperlipidemia Currently on rosuvastatin.   Will change rosuvastatin from twice weekly to daily with repeat labs in 6 weeks. He denies myalgia or other side effects.  Non-insulin dependent type 2 diabetes mellitus (HCC) Reemphasized importance of glycemic control. Consider low-dose ARB for renal protection given his diabetes-may discuss with PCP.  Smoking Tobacco cessation counseling: Currently smoking 5 cigarettes/day Patient was informed of the dangers of tobacco abuse including stroke, cancer, and MI, as well as benefits of tobacco cessation. Patient is willing to quit at this time. 7 mins were spent counseling patient cessation techniques. We discussed various methods to help quit smoking, including deciding on a date to quit, joining a support group, pharmacological agents- nicotine gum/patch/lozenges.  I will reassess his progress at the next follow-up visit  FINAL MEDICATION LIST END OF ENCOUNTER: Meds ordered this encounter  Medications   rosuvastatin (CRESTOR) 5 MG tablet    Sig: Take 1 tablet (5 mg total) by mouth at bedtime.    Dispense:  90 tablet    Refill:  0    Medications  Discontinued During This Encounter  Medication Reason   carvedilol (COREG) 3.125 MG tablet    doxycycline (VIBRAMYCIN) 100 MG capsule Completed Course   pantoprazole (PROTONIX) 40 MG tablet    tamsulosin (FLOMAX) 0.4 MG CAPS capsule    rosuvastatin (CRESTOR) 5 MG tablet      Current Outpatient Medications:    acetaminophen (TYLENOL) 500 MG tablet, Take 500-1,000 mg by mouth every 6 (six) hours as needed (pain.)., Disp: , Rfl:    albuterol (VENTOLIN HFA) 108 (90 Base) MCG/ACT inhaler, Inhale 2 puffs into the lungs every 6 (six) hours as needed for wheezing., Disp: 1 each, Rfl:  6   aspirin EC 81 MG tablet, Take 1 tablet (81 mg total) by mouth daily. Swallow whole., Disp: 150 tablet, Rfl: 2   blood glucose meter kit and supplies, Dispense based on patient and insurance preference. Use to check glucose once daily as directed. (FOR ICD-10 E11.9)., Disp: 1 each, Rfl: 0   buPROPion (WELLBUTRIN XL) 150 MG 24 hr tablet, TAKE 1 TABLET BY MOUTH EVERY DAY, Disp: 90 tablet, Rfl: 1   clopidogrel (PLAVIX) 75 MG tablet, TAKE 1 TABLET BY MOUTH EVERY DAY, Disp: 90 tablet, Rfl: 1   empagliflozin (JARDIANCE) 10 MG TABS tablet, Take 1 tablet (10 mg total) by mouth daily before breakfast., Disp: 90 tablet, Rfl: 1   ezetimibe (ZETIA) 10 MG tablet, TAKE 1 TABLET BY MOUTH EVERY DAY (Patient taking differently: Take 10 mg by mouth at bedtime.), Disp: 90 tablet, Rfl: 1   FLUoxetine (PROZAC) 20 MG capsule, TAKE 3 CAPSULES BY MOUTH EVERY DAY (Patient taking differently: Take 60 mg by mouth at bedtime.), Disp: 270 capsule, Rfl: 1   gabapentin (NEURONTIN) 400 MG capsule, Take 400 mg by mouth at bedtime., Disp: , Rfl:    glipiZIDE (GLUCOTROL XL) 2.5 MG 24 hr tablet, Take 2.5 mg by mouth in the morning., Disp: , Rfl:    Lancets (ONETOUCH ULTRASOFT) lancets, , Disp: , Rfl:    metFORMIN (GLUCOPHAGE) 500 MG tablet, TAKE 2 TABLET BY MOUTH EVERY MORNING AND EVENING, Disp: , Rfl:    naloxone (NARCAN) nasal spray 4 mg/0.1 mL, Use as  directed, Disp: 1 each, Rfl: 0   ONETOUCH VERIO test strip, USE TO CHECK BLOOD SUGAR LEVELS ONCE DAILY, Disp: 50 strip, Rfl: 0   Oxycodone HCl 20 MG TABS, Take 20 mg by mouth every 4 (four) hours as needed (pain.). Take 1 tablet by mouth 4-5 times daily if tolerated, Disp: , Rfl:    traZODone (DESYREL) 50 MG tablet, TAKE 1 TABLET BY MOUTH AT BEDTIME AS NEEDED FOR INSOMNIA (Patient taking differently: Take 50 mg by mouth at bedtime.), Disp: 90 tablet, Rfl: 1   rosuvastatin (CRESTOR) 5 MG tablet, Take 1 tablet (5 mg total) by mouth at bedtime., Disp: 90 tablet, Rfl: 0  Orders Placed This Encounter  Procedures   CT CARDIAC SCORING (SELF PAY ONLY)   Lipid Panel With LDL/HDL Ratio   LDL cholesterol, direct   [id number]   PCV MYOCARDIAL PERFUSION WITH LEXISCAN   EKG 12-Lead    There are no Patient Instructions on file for this visit.   --Continue cardiac medications as reconciled in final medication list. --Return in about 7 weeks (around 04/13/2023) for Follow up, Review test results. or sooner if needed. --Continue follow-up with your primary care physician regarding the management of your other chronic comorbid conditions.  Patient's questions and concerns were addressed to his satisfaction. He voices understanding of the instructions provided during this encounter.   This note was created using a voice recognition software as a result there may be grammatical errors inadvertently enclosed that do not reflect the nature of this encounter. Every attempt is made to correct such errors.  Tessa Lerner, [place], [facility]  Pager:  [phone] Office: [phone]

## 2023-02-24 ENCOUNTER — Ambulatory Visit: Payer: 59 | Admitting: Gastroenterology

## 2023-02-25 ENCOUNTER — Encounter (HOSPITAL_COMMUNITY): Payer: 59

## 2023-02-25 ENCOUNTER — Other Ambulatory Visit (HOSPITAL_COMMUNITY): Payer: 59

## 2023-02-25 ENCOUNTER — Ambulatory Visit: Payer: 59

## 2023-03-02 ENCOUNTER — Telehealth: Payer: Self-pay

## 2023-03-02 NOTE — Progress Notes (Signed)
POST OPERATIVE OFFICE NOTE    CC:  F/u for surgery  HPI:  This is a 67 y.o. male who is s/p right femoral to posterior tibial artery bypass using vein by Dr. Karin Lieu on 12/13/2022 due to critical limb ischemia with rest pain of the right foot.    He presents today to discuss slowly healing wounds in the right lower extremity. On exam, Ozzy was sitting comfortably, accompanied by his wife.  He has noted right lower extremity swelling since surgery.  He has not worn compression stockings, sits in the dependent position, has noted some clear drainage from the wound beds.  No purulence.  Denies fevers, chills.  Was placed on Augmentin by his primary care.     Allergies  Allergen Reactions   Doxycycline Nausea Only    PATIENT STILL TOLERATES   Pravastatin Other (See Comments)    myalgia    Current Outpatient Medications  Medication Sig Dispense Refill   acetaminophen (TYLENOL) 500 MG tablet Take 500-1,000 mg by mouth every 6 (six) hours as needed (pain.).     albuterol (VENTOLIN HFA) 108 (90 Base) MCG/ACT inhaler Inhale 2 puffs into the lungs every 6 (six) hours as needed for wheezing. 1 each 6   aspirin EC 81 MG tablet Take 1 tablet (81 mg total) by mouth daily. Swallow whole. 150 tablet 2   blood glucose meter kit and supplies Dispense based on patient and insurance preference. Use to check glucose once daily as directed. (FOR ICD-10 E11.9). 1 each 0   buPROPion (WELLBUTRIN XL) 150 MG 24 hr tablet TAKE 1 TABLET BY MOUTH EVERY DAY 90 tablet 1   clopidogrel (PLAVIX) 75 MG tablet TAKE 1 TABLET BY MOUTH EVERY DAY 90 tablet 1   empagliflozin (JARDIANCE) 10 MG TABS tablet Take 1 tablet (10 mg total) by mouth daily before breakfast. 90 tablet 1   ezetimibe (ZETIA) 10 MG tablet TAKE 1 TABLET BY MOUTH EVERY DAY (Patient taking differently: Take 10 mg by mouth at bedtime.) 90 tablet 1   FLUoxetine (PROZAC) 20 MG capsule TAKE 3 CAPSULES BY MOUTH EVERY DAY (Patient taking differently: Take 60 mg  by mouth at bedtime.) 270 capsule 1   gabapentin (NEURONTIN) 400 MG capsule Take 400 mg by mouth at bedtime.     glipiZIDE (GLUCOTROL XL) 2.5 MG 24 hr tablet Take 2.5 mg by mouth in the morning.     Lancets (ONETOUCH ULTRASOFT) lancets      metFORMIN (GLUCOPHAGE) 500 MG tablet TAKE 2 TABLET BY MOUTH EVERY MORNING AND EVENING     naloxone (NARCAN) nasal spray 4 mg/0.1 mL Use as directed 1 each 0   ONETOUCH VERIO test strip USE TO CHECK BLOOD SUGAR LEVELS ONCE DAILY 50 strip 0   Oxycodone HCl 20 MG TABS Take 20 mg by mouth every 4 (four) hours as needed (pain.). Take 1 tablet by mouth 4-5 times daily if tolerated     rosuvastatin (CRESTOR) 5 MG tablet Take 1 tablet (5 mg total) by mouth at bedtime. 90 tablet 0   traZODone (DESYREL) 50 MG tablet TAKE 1 TABLET BY MOUTH AT BEDTIME AS NEEDED FOR INSOMNIA (Patient taking differently: Take 50 mg by mouth at bedtime.) 90 tablet 1   No current facility-administered medications for this visit.     ROS:  See HPI  Physical Exam:  There were no vitals filed for this visit.   Incision: Right groin incision healed with small separation at the proximal pole seemingly superficial without drainage or  purulence; small vein harvest incision around the level of the knee with small area of separation seemingly superficial without purulence; all other incisions healing well Extremities: Palpable PT pulse; edema throughout the right lower leg with skin changes.  Prior knee replacement on that side as well Neuro: A&O     +-------+-----------+-----------+------------+------------+  ABI/TBIToday's ABIToday's TBIPrevious ABIPrevious TBI  +-------+-----------+-----------+------------+------------+  Right 0.82       0.41       0.57        0.27          +-------+-----------+-----------+------------+------------+  Left  0.64       0.33       0.76        0.43          +-------+-----------+-----------+------------+------------+   Right Graft #1:  Femoral-proximal PTA  +------------------+--------+---------------+----------+--------+                   PSV cm/sStenosis       Waveform  Comments  +------------------+--------+---------------+----------+--------+  Inflow           152                    monophasic          +------------------+--------+---------------+----------+--------+  Prox Anastomosis  164                    monophasic          +------------------+--------+---------------+----------+--------+  Proximal Graft    100                    monophasic          +------------------+--------+---------------+----------+--------+  Mid Graft         71                     monophasic          +------------------+--------+---------------+----------+--------+  Distal Graft      130                    monophasic          +------------------+--------+---------------+----------+--------+  Distal Anastomosis137                    monophasic          +------------------+--------+---------------+----------+--------+  Outflow          240     50-74% stenosismonophasic          +------------------+--------+---------------+----------+--------+    Assessment/Plan:  This is a 67 y.o. male who is s/p: Right femoral to posterior tibial artery bypass with vein due to critical limb ischemia and rest pain of the right lower extremity  Patient was seen today due to concern for right extremity swelling.  On exam, woody texture to the lower extremity.  States he stays in the dependent position for most of the day.  Has not used compression.  Has not been compliant with aspirin.  Continues to smoke.  Patient also has prior knee replacement on the right side which can compound symptoms.  We had a a long conversation regarding the above.  Avenir needs to elevate and compress in an effort to heal his saphenectomy sites.  Trea has multiple reasons for right lower extremity swelling including recent surgery  as well as prior right knee replacement.  No concern for infection.  His graft is widely patent.  No concern for DVT.   My plan is  to see him in 2 weeks to ensure wounds are healing appropriately.  He plans to wear compression stockings on a daily basis and elevate is much as possible.  We discussed the importance of smoking cessation, as well as medication compliance and effort to promote bypass patency.    He states he may need oral surgery soon, therefore he can stop his Plavix as needed.  I asked that he continue aspirin if possible.  Plan wound checks until he has healed.  37-month follow-up for studies.   [name], MD [facility] [phone]

## 2023-03-02 NOTE — Telephone Encounter (Signed)
Caller: Patient  Concern: redness that is sensitive to touch, blister between great toe and 2nd toe, warmth, swelling, and itching  Pt denies rest pain, fever, chills, N/V  Location: right leg  Description:  redness began a few days ago, blister noticed today  Aggravating Factors: touching  Quality:  sensitivity  Treatments:  Instructed pt to elevate properly to help with swelling  Procedure: Vascular Bypass  Resolution:  Appts for 8/16 moved earlier d/t symptoms  Next Appt: Appointment scheduled for 8/2 with [place] & Dr. Karin Lieu

## 2023-03-03 ENCOUNTER — Other Ambulatory Visit: Payer: 59

## 2023-03-04 ENCOUNTER — Encounter: Payer: Self-pay | Admitting: Vascular Surgery

## 2023-03-04 ENCOUNTER — Ambulatory Visit (INDEPENDENT_AMBULATORY_CARE_PROVIDER_SITE_OTHER): Payer: 59 | Admitting: Vascular Surgery

## 2023-03-04 ENCOUNTER — Ambulatory Visit (INDEPENDENT_AMBULATORY_CARE_PROVIDER_SITE_OTHER)
Admission: RE | Admit: 2023-03-04 | Discharge: 2023-03-04 | Disposition: A | Discharge status: Home / Self Care | Payer: 59 | Source: Ambulatory Visit | Attending: Vascular Surgery | Admitting: Vascular Surgery

## 2023-03-04 ENCOUNTER — Ambulatory Visit (HOSPITAL_COMMUNITY)
Admission: RE | Admit: 2023-03-04 | Discharge: 2023-03-04 | Disposition: A | Discharge status: Home / Self Care | Payer: 59 | Source: Ambulatory Visit | Attending: Vascular Surgery | Admitting: Vascular Surgery

## 2023-03-04 VITALS — BP 113/70 | HR 70 | Temp 98.2°F | Resp 20 | Ht 68.0 in | Wt 221.3 lb

## 2023-03-04 DIAGNOSIS — I739 Peripheral vascular disease, unspecified: Secondary | ICD-10-CM

## 2023-03-04 DIAGNOSIS — I70213 Atherosclerosis of native arteries of extremities with intermittent claudication, bilateral legs: Secondary | ICD-10-CM | POA: Diagnosis not present

## 2023-03-04 DIAGNOSIS — I70221 Atherosclerosis of native arteries of extremities with rest pain, right leg: Secondary | ICD-10-CM | POA: Insufficient documentation

## 2023-03-04 DIAGNOSIS — R600 Localized edema: Secondary | ICD-10-CM

## 2023-03-04 LAB — VAS US ABI WITH/WO TBI
Left ABI: 0.64
Right ABI: 0.82

## 2023-03-07 ENCOUNTER — Other Ambulatory Visit: Payer: 59

## 2023-03-08 ENCOUNTER — Ambulatory Visit: Payer: 59 | Admitting: Gastroenterology

## 2023-03-08 ENCOUNTER — Encounter: Payer: Self-pay | Admitting: Gastroenterology

## 2023-03-09 NOTE — Progress Notes (Signed)
This encounter was created in error - please disregard.

## 2023-03-14 ENCOUNTER — Other Ambulatory Visit: Payer: 59

## 2023-03-18 ENCOUNTER — Ambulatory Visit (INDEPENDENT_AMBULATORY_CARE_PROVIDER_SITE_OTHER): Payer: 59 | Admitting: Physician Assistant

## 2023-03-18 ENCOUNTER — Other Ambulatory Visit (HOSPITAL_COMMUNITY): Payer: 59

## 2023-03-18 ENCOUNTER — Other Ambulatory Visit: Payer: Self-pay

## 2023-03-18 ENCOUNTER — Encounter (HOSPITAL_COMMUNITY): Payer: 59

## 2023-03-18 ENCOUNTER — Ambulatory Visit: Payer: 59

## 2023-03-18 VITALS — BP 120/83 | HR 69 | Temp 97.8°F | Resp 16 | Ht 69.0 in | Wt 220.0 lb

## 2023-03-18 DIAGNOSIS — I739 Peripheral vascular disease, unspecified: Secondary | ICD-10-CM

## 2023-03-18 NOTE — Progress Notes (Signed)
Office Note   History of Present Illness   Jim [name] is a 67 y.o. ([birth year]) male who presents for a wound check.  He has a history of right common femoral to posterior tibial artery bypass on 12/13/2022 by Dr. Karin Lieu.  This was done for critical limb ischemia with rest pain.  Postoperatively he had issues with right leg swelling, causing small dehiscence of the vein harvest site at his knee.  He was encouraged to keep this area clean and dry.  He was also encouraged to wear compression stockings and elevate his legs to reduce swelling.  He returns today for a repeat incision check.  He states he wore his compression stockings and elevating his legs for about a week. His swelling in the right leg went away about a week ago, so he stopped wearing his compression stockings.  He has been keeping his incisions clean and dry and believes that have fully healed.  He denies any signs of infection.  He denies any claudication, rest pain, or tissue loss.  He is able to walk more than a mile daily without claudication.  Current Outpatient Medications  Medication Sig Dispense Refill   acetaminophen (TYLENOL) 500 MG tablet Take 500-1,000 mg by mouth every 6 (six) hours as needed (pain.).     albuterol (VENTOLIN HFA) 108 (90 Base) MCG/ACT inhaler Inhale 2 puffs into the lungs every 6 (six) hours as needed for wheezing. 1 each 6   aspirin EC 81 MG tablet Take 1 tablet (81 mg total) by mouth daily. Swallow whole. 150 tablet 2   blood glucose meter kit and supplies Dispense based on patient and insurance preference. Use to check glucose once daily as directed. (FOR ICD-10 E11.9). 1 each 0   buPROPion (WELLBUTRIN XL) 150 MG 24 hr tablet TAKE 1 TABLET BY MOUTH EVERY DAY 90 tablet 1   clopidogrel (PLAVIX) 75 MG tablet TAKE 1 TABLET BY MOUTH EVERY DAY 90 tablet 1   empagliflozin (JARDIANCE) 10 MG TABS tablet Take 1 tablet (10 mg total) by mouth daily before breakfast. 90 tablet 1   ezetimibe (ZETIA) 10 MG  tablet TAKE 1 TABLET BY MOUTH EVERY DAY (Patient taking differently: Take 10 mg by mouth at bedtime.) 90 tablet 1   FLUoxetine (PROZAC) 20 MG capsule TAKE 3 CAPSULES BY MOUTH EVERY DAY (Patient taking differently: Take 60 mg by mouth at bedtime.) 270 capsule 1   gabapentin (NEURONTIN) 400 MG capsule Take 400 mg by mouth at bedtime.     glipiZIDE (GLUCOTROL XL) 2.5 MG 24 hr tablet Take 2.5 mg by mouth in the morning.     Lancets (ONETOUCH ULTRASOFT) lancets      metFORMIN (GLUCOPHAGE) 500 MG tablet TAKE 2 TABLET BY MOUTH EVERY MORNING AND EVENING     naloxone (NARCAN) nasal spray 4 mg/0.1 mL Use as directed 1 each 0   ONETOUCH VERIO test strip USE TO CHECK BLOOD SUGAR LEVELS ONCE DAILY 50 strip 0   Oxycodone HCl 20 MG TABS Take 20 mg by mouth every 4 (four) hours as needed (pain.). Take 1 tablet by mouth 4-5 times daily if tolerated     rosuvastatin (CRESTOR) 5 MG tablet Take 1 tablet (5 mg total) by mouth at bedtime. 90 tablet 0   traZODone (DESYREL) 50 MG tablet TAKE 1 TABLET BY MOUTH AT BEDTIME AS NEEDED FOR INSOMNIA (Patient taking differently: Take 50 mg by mouth at bedtime.) 90 tablet 1   No current facility-administered medications for this  visit.    REVIEW OF SYSTEMS (negative unless checked):   Cardiac:  []  Chest pain or chest pressure? []  Shortness of breath upon activity? []  Shortness of breath when lying flat? []  Irregular heart rhythm?  Vascular:  []  Pain in calf, thigh, or hip brought on by walking? []  Pain in feet at night that wakes you up from your sleep? []  Blood clot in your veins? []  Leg swelling?  Pulmonary:  []  Oxygen at home? []  Productive cough? []  Wheezing?  Neurologic:  []  Sudden weakness in arms or legs? []  Sudden numbness in arms or legs? []  Sudden onset of difficult speaking or slurred speech? []  Temporary loss of vision in one eye? []  Problems with dizziness?  Gastrointestinal:  []  Blood in stool? []  Vomited blood?  Genitourinary:  []  Burning  when urinating? []  Blood in urine?  Psychiatric:  []  Major depression  Hematologic:  []  Bleeding problems? []  Problems with blood clotting?  Dermatologic:  []  Rashes or ulcers?  Constitutional:  []  Fever or chills?  Ear/Nose/Throat:  []  Change in hearing? []  Nose bleeds? []  Sore throat?  Musculoskeletal:  []  Back pain? []  Joint pain? []  Muscle pain?   Physical Examination   Vitals:   03/18/23 0943  BP: 120/83  Pulse: 69  Resp: 16  Temp: 97.8 F (36.6 C)  TempSrc: Temporal  SpO2: 94%  Weight: 220 lb (99.8 kg)  Height: 5\' 9"  (1.753 m)   Body mass index is 32.49 kg/m.  General:  WDWN in NAD; vital signs documented above Gait: Not observed HENT: WNL, normocephalic Pulmonary: normal non-labored breathing , without rales, rhonchi,  wheezing Cardiac: regular Abdomen: soft, NT, no masses Skin: without rashes Vascular Exam/Pulses: palpable right PT pulse Extremities: without ischemic changes, without gangrene , without cellulitis; without open wounds; well healed right groin and right lower extremity incisions Musculoskeletal: no muscle wasting or atrophy  Neurologic: A&O X 3;  No focal weakness or paresthesias are detected Psychiatric:  The pt has Normal affect.   Medical Decision Making   Jim [name] is a 67 y.o. male who presents for repeat incision check  The patient underwent right femoral to posterior tibial artery bypass on 12/13/2022.  Postoperatively he experienced issues with right lower extremity swelling, causing dehiscence of one of his incisions at the knee He returns today for repeat incision check.  His right groin and right lower extremity incisions are well-healed.  There are no signs of infection, dehiscence, or skin necrosis He endorses wearing compression stockings and elevating his legs as needed to help with swelling.  He denies any issues with right leg swelling for the past week On exam his right lower extremity is warm and  well-perfused with a palpable right PT pulse.  He denies any claudication, rest pain, or tissue loss He can follow-up with our office in 3 months with bypass graft duplex and ABIs   Loel Dubonnet PA-C [facility] [place] Office: [phone]  Clinic MD: Chestine Spore

## 2023-03-21 ENCOUNTER — Other Ambulatory Visit: Payer: Self-pay | Admitting: Family Medicine

## 2023-03-21 DIAGNOSIS — M47816 Spondylosis without myelopathy or radiculopathy, lumbar region: Secondary | ICD-10-CM | POA: Diagnosis not present

## 2023-03-22 DIAGNOSIS — M47816 Spondylosis without myelopathy or radiculopathy, lumbar region: Secondary | ICD-10-CM | POA: Diagnosis not present

## 2023-03-22 DIAGNOSIS — G5602 Carpal tunnel syndrome, left upper limb: Secondary | ICD-10-CM | POA: Diagnosis not present

## 2023-03-24 ENCOUNTER — Other Ambulatory Visit: Payer: 59

## 2023-04-03 ENCOUNTER — Other Ambulatory Visit: Payer: Self-pay | Admitting: Family Medicine

## 2023-04-09 ENCOUNTER — Other Ambulatory Visit: Payer: Self-pay | Admitting: Family Medicine

## 2023-04-11 ENCOUNTER — Ambulatory Visit (HOSPITAL_COMMUNITY): Payer: 59

## 2023-04-12 ENCOUNTER — Ambulatory Visit (INDEPENDENT_AMBULATORY_CARE_PROVIDER_SITE_OTHER): Payer: 59 | Admitting: Family Medicine

## 2023-04-12 VITALS — BP 122/74 | HR 80 | Temp 97.2°F | Ht 69.0 in | Wt 215.6 lb

## 2023-04-12 DIAGNOSIS — E1169 Type 2 diabetes mellitus with other specified complication: Secondary | ICD-10-CM | POA: Diagnosis not present

## 2023-04-12 DIAGNOSIS — I10 Essential (primary) hypertension: Secondary | ICD-10-CM

## 2023-04-12 DIAGNOSIS — Z79899 Other long term (current) drug therapy: Secondary | ICD-10-CM | POA: Diagnosis not present

## 2023-04-12 DIAGNOSIS — D696 Thrombocytopenia, unspecified: Secondary | ICD-10-CM | POA: Diagnosis not present

## 2023-04-12 DIAGNOSIS — E1140 Type 2 diabetes mellitus with diabetic neuropathy, unspecified: Secondary | ICD-10-CM | POA: Diagnosis not present

## 2023-04-12 DIAGNOSIS — E785 Hyperlipidemia, unspecified: Secondary | ICD-10-CM

## 2023-04-12 NOTE — Progress Notes (Signed)
Subjective:    Patient ID: Jim [name], male    DOB: [birth year], 67 y.o.   MRN: [id number]  HPI Pt comes in today for 3 month follow up. No questions or concerns at this time. Type 2 diabetes mellitus with diabetic neuropathy, without long-term current use of insulin (HCC) - Plan: Microalbumin/Creatinine Ratio, Urine, Hemoglobin A1c  HTN (hypertension), benign - Plan: Basic Metabolic Panel  Hyperlipidemia associated with type 2 diabetes mellitus (HCC) - Plan: Lipid Panel  High risk medication use - Plan: Hepatic Function Panel  Thrombocytopenia (HCC) - Plan: CBC with Differential  Patient for blood pressure check up.  The patient does have hypertension.   Patient relates dietary measures try to minimize salt The importance of healthy diet and activity were discussed Patient relates compliance  Patient here for follow-up regarding cholesterol.    Patient relates taking medication on a regular basis Denies problems with medication Importance of dietary measures discussed Regular lab work regarding lipid and liver was checked and if needing additional labs was appropriately ordered The patient was seen today as part of a comprehensive diabetic check up. Patient has diabetes Patient relates good compliance with taking the medication. We discussed their diet and exercise activities  We also discussed the importance of notifying us if any excessively high glucoses or low sugars.   Patient also has thrombocytopenia issues but no bleeding issues He has peripheral vascular disease he still smokes he has been counseled to quit smoking but unlikely to do so Patient does state he is trying to eat relatively healthy Denies any excessive starch intake   Review of Systems     Objective:   Physical Exam  General-in no acute distress Eyes-no discharge Lungs-respiratory rate normal, CTA CV-no murmurs,RRR Extremities skin warm dry no edema Neuro grossly normal Behavior normal,  alert       Assessment & Plan:  1. Type 2 diabetes mellitus with diabetic neuropathy, without long-term current use of insulin (HCC) The patient was seen today as part of a comprehensive visit for diabetes. The importance of keeping her A1c at or below 7 range was discussed.  Discussed diet, activity, and medication compliance Emphasized healthy eating primarily with vegetables fruits and if utilizing meats lean meats such as chicken or fish grilled baked broiled Avoid sugary drinks Minimize and avoid processed foods Fit in regular physical activity preferably 25 to 30 minutes 4 times per week Standard follow-up visit recommended.  Patient aware lack of control and follow-up increases risk of diabetic complications. Regular follow-up visits Yearly ophthalmology Yearly foot exam  - Microalbumin/Creatinine Ratio, Urine - Hemoglobin A1c  2. HTN (hypertension), benign HTN- patient seen for follow-up regarding HTN.   Diet, medication compliance, appropriate labs and refills were completed.   Importance of keeping blood pressure under good control to lessen the risk of complications discussed Regular follow-up visits discussed  - Basic Metabolic Panel  3. Hyperlipidemia associated with type 2 diabetes mellitus (HCC) Hyperlipidemia-importance of diet, weight control, activity, compliance with medications discussed.   Recent labs reviewed.   Any additional labs or refills ordered.   Importance of keeping under good control discussed. Regular follow-up visits discussed  - Lipid Panel  4. High risk medication use Check labs - Hepatic Function Panel  5. Thrombocytopenia (HCC) No sign of bleeding issues but he does have some bruising on the arms we will check the platelet count - CBC with Differential Patient was strongly encouraged to quit smoking unlikely he will do so but  I challenged him to quit smoking by the end of the year  Follow-up in approximately 6 months sooner if any  issues

## 2023-04-14 ENCOUNTER — Ambulatory Visit: Payer: 59 | Admitting: Cardiology

## 2023-04-14 NOTE — Telephone Encounter (Signed)
My nature is I dont trust this process On his med list there is no coreg. Therefore before proceeding verify these with patient. He may have 90 day with 1 rf of the ones he is still on and cancel any others If any concerns ask me SAL

## 2023-04-19 DIAGNOSIS — G5602 Carpal tunnel syndrome, left upper limb: Secondary | ICD-10-CM | POA: Diagnosis not present

## 2023-04-19 DIAGNOSIS — M47816 Spondylosis without myelopathy or radiculopathy, lumbar region: Secondary | ICD-10-CM | POA: Diagnosis not present

## 2023-04-19 DIAGNOSIS — Z79891 Long term (current) use of opiate analgesic: Secondary | ICD-10-CM | POA: Diagnosis not present

## 2023-04-19 DIAGNOSIS — G894 Chronic pain syndrome: Secondary | ICD-10-CM | POA: Diagnosis not present

## 2023-04-20 DIAGNOSIS — M47816 Spondylosis without myelopathy or radiculopathy, lumbar region: Secondary | ICD-10-CM | POA: Diagnosis not present

## 2023-04-27 ENCOUNTER — Ambulatory Visit (HOSPITAL_COMMUNITY)
Admission: RE | Admit: 2023-04-27 | Discharge: 2023-04-27 | Disposition: A | Discharge status: Home / Self Care | Payer: 59 | Source: Ambulatory Visit | Attending: Family Medicine | Admitting: Family Medicine

## 2023-04-27 ENCOUNTER — Ambulatory Visit: Payer: 59 | Admitting: Nurse Practitioner

## 2023-04-27 VITALS — BP 129/78 | HR 79 | Temp 98.1°F | Ht 69.0 in | Wt 217.8 lb

## 2023-04-27 DIAGNOSIS — R0789 Other chest pain: Secondary | ICD-10-CM | POA: Diagnosis not present

## 2023-04-27 DIAGNOSIS — I700 Atherosclerosis of aorta: Secondary | ICD-10-CM | POA: Diagnosis not present

## 2023-04-27 DIAGNOSIS — K219 Gastro-esophageal reflux disease without esophagitis: Secondary | ICD-10-CM

## 2023-04-27 DIAGNOSIS — R051 Acute cough: Secondary | ICD-10-CM

## 2023-04-27 MED ORDER — PANTOPRAZOLE SODIUM 40 MG PO TBEC: 40.0000 mg | DELAYED_RELEASE_TABLET | Freq: Every day | ORAL | 0 refills | Status: DC

## 2023-04-27 NOTE — Progress Notes (Unsigned)
Subjective:    Patient ID: Jim [name], male    DOB: 1956/05/07, 67 y.o.   MRN: [id number]  HPI Pt comes in today for sick visit with symptoms that consist of cough congestion for two weeks.  Presents with complaints of cough for the past 3 weeks.  Was having an occasional cough until 2 days ago when he fell off his porch and hit the right mid rib area.  Cough has worsened since then.  Describes a prolonged spell of harsh coughing to the point where he vomits.  Nonproductive.  No wheezing.  Localized chest pain otherwise no chest discomfort.  No fever.  Some sore throat.  No ear pain.  Off-and-on sinus pressure.  No nausea.  No abdominal pain.  Denies any GERD symptoms.  No diarrhea constipation or change in the color of his stools.  Using albuterol inhaler twice a day with slight improvement of his cough. Has significant caffeine intake.  Also diet has citrus and spicy foods.  Denies any excessive NSAID use.  Has taken NyQuil for his cough at bedtime.       Objective:   Physical Exam NAD.  Alert, oriented.  TMs no erythema, retracted on the left side.  Pharynx clear, mucous membranes moist.  Neck supple with mild soft anterior adenopathy.  Lungs breath sounds diminished but clear.  No tachypnea.  Normal color.  Occasional harsh nonproductive cough noted.  Localized tenderness with palpation of the right middle chest wall towards the anterior axillary line.  Skin clear.  Heart regular rate rhythm.  Abdomen soft nondistended with mild epigastric area tenderness noted. Today's Vitals   04/27/23 1609  BP: 129/78  Pulse: 79  Temp: 98.1 F (36.7 C)  SpO2: 92%  Weight: 217 lb 12.8 oz (98.8 kg)  Height: 5\' 9"  (1.753 m)   Body mass index is 32.16 kg/m.        Assessment & Plan:   Problem List Items Addressed This Visit       Digestive   Gastroesophageal reflux disease without esophagitis   Relevant Medications   pantoprazole (PROTONIX) 40 MG tablet     Other   Right-sided  chest wall pain   Relevant Orders   DG Chest 2 View   Other Visit Diagnoses     Acute cough    -  Primary      Meds ordered this encounter  Medications   pantoprazole (PROTONIX) 40 MG tablet    Sig: Take 1 tablet (40 mg total) by mouth daily. For acid reflux    Dispense:  30 tablet    Refill:  0    Order Specific Question:   Supervising Provider    Answer:   Babs Sciara [phone]   Given written and verbal information on GERD.  Start pantoprazole 40 mg daily.  Explained that this is probably contributing to his sore throat and cough. Because of the change in his cough as well as his recent injury, a chest x-ray was ordered. Continue OTC meds as directed for cough. Further follow-up based on x-ray report.  Patient to contact office or go to ED sooner if any new or worsening symptoms.

## 2023-04-27 NOTE — Patient Instructions (Signed)
Gastroesophageal Reflux Disease, Adult    Gastroesophageal reflux (GER) happens when acid from the stomach flows up into the tube that connects the mouth and the stomach (esophagus). Normally, food travels down the esophagus and stays in the stomach to be digested. However, when a person has GER, food and stomach acid sometimes move back up into the esophagus. If this becomes a more serious problem, the person may be diagnosed with a disease called gastroesophageal reflux disease (GERD). GERD occurs when the reflux:  Happens often.  Causes frequent or severe symptoms.  Causes problems such as damage to the esophagus.  When stomach acid comes in contact with the esophagus, the acid may cause inflammation in the esophagus. Over time, GERD may create small holes (ulcers) in the lining of the esophagus.  What are the causes?  This condition is caused by a problem with the muscle between the esophagus and the stomach (lower esophageal sphincter, or LES). Normally, the LES muscle closes after food passes through the esophagus to the stomach. When the LES is weakened or abnormal, it does not close properly, and that allows food and stomach acid to go back up into the esophagus.  The LES can be weakened by certain dietary substances, medicines, and medical conditions, including:  Tobacco use.  Pregnancy.  Having a hiatal hernia.  Alcohol use.  Certain foods and beverages, such as coffee, chocolate, onions, and peppermint.  What increases the risk?  You are more likely to develop this condition if you:  Have an increased body weight.  Have a connective tissue disorder.  Take NSAIDs, such as ibuprofen.  What are the signs or symptoms?  Symptoms of this condition include:  Heartburn.  Difficult or painful swallowing and the feeling of having a lump in the throat.  A bitter taste in the mouth.  Bad breath and having a large amount of saliva.  Having an upset or bloated stomach and belching.  Chest pain. Different conditions can  cause chest pain. Make sure you see your health care provider if you experience chest pain.  Shortness of breath or wheezing.  Ongoing (chronic) cough or a nighttime cough.  Wearing away of tooth enamel.  Weight loss.  How is this diagnosed?  This condition may be diagnosed based on a medical history and a physical exam. To determine if you have mild or severe GERD, your health care provider may also monitor how you respond to treatment. You may also have tests, including:  A test to examine your stomach and esophagus with a small camera (endoscopy).  A test that measures the acidity level in your esophagus.  A test that measures how much pressure is on your esophagus.  A barium swallow or modified barium swallow test to show the shape, size, and functioning of your esophagus.  How is this treated?  Treatment for this condition may vary depending on how severe your symptoms are. Your health care provider may recommend:  Changes to your diet.  Medicine.  Surgery.  The goal of treatment is to help relieve your symptoms and to prevent complications.  Follow these instructions at home:  Eating and drinking    Follow a diet as recommended by your health care provider. This may involve avoiding foods and drinks such as:  Coffee and tea, with or without caffeine.  Drinks that contain alcohol.  Energy drinks and sports drinks.  Carbonated drinks or sodas.  Chocolate and cocoa.  Peppermint and mint flavorings.  Garlic and onions.  Horseradish.  Spicy and acidic foods, including peppers, chili powder, curry powder, vinegar, hot sauces, and barbecue sauce.  Citrus fruit juices and citrus fruits, such as oranges, lemons, and limes.  Tomato-based foods, such as red sauce, chili, salsa, and pizza with red sauce.  Fried and fatty foods, such as donuts, french fries, potato chips, and high-fat dressings.  High-fat meats, such as hot dogs and fatty cuts of red and white meats, such as rib eye steak, sausage, ham, and  bacon.  High-fat dairy items, such as whole milk, butter, and cream cheese.  Eat small, frequent meals instead of large meals.  Avoid drinking large amounts of liquid with your meals.  Avoid eating meals during the 2-3 hours before bedtime.  Avoid lying down right after you eat.  Do not exercise right after you eat.  Lifestyle    Do not use any products that contain nicotine or tobacco. These products include cigarettes, chewing tobacco, and vaping devices, such as e-cigarettes. If you need help quitting, ask your health care provider.  Try to reduce your stress by using methods such as yoga or meditation. If you need help reducing stress, ask your health care provider.  If you are overweight, reduce your weight to an amount that is healthy for you. Ask your health care provider for guidance about a safe weight loss goal.  General instructions  Pay attention to any changes in your symptoms.  Take over-the-counter and prescription medicines only as told by your health care provider. Do not take aspirin, ibuprofen, or other NSAIDs unless your health care provider told you to take these medicines.  Wear loose-fitting clothing. Do not wear anything tight around your waist that causes pressure on your abdomen.  Raise (elevate) the head of your bed about 6 inches (15 cm). You can use a wedge to do this.  Avoid bending over if this makes your symptoms worse.  Keep all follow-up visits. This is important.  Contact a health care provider if:  You have:  New symptoms.  Unexplained weight loss.  Difficulty swallowing or it hurts to swallow.  Wheezing or a persistent cough.  A hoarse voice.  Your symptoms do not improve with treatment.  Get help right away if:  You have sudden pain in your arms, neck, jaw, teeth, or back.  You suddenly feel sweaty, dizzy, or light-headed.  You have chest pain or shortness of breath.  You vomit and the vomit is green, yellow, or black, or it looks like blood or coffee grounds.  You faint.  You  have stool that is red, bloody, or black.  You cannot swallow, drink, or eat.  These symptoms may represent a serious problem that is an emergency. Do not wait to see if the symptoms will go away. Get medical help right away. Call your local emergency services (911 in the [place] Do not drive yourself to the hospital.  Summary  Gastroesophageal reflux happens when acid from the stomach flows up into the esophagus. GERD is a disease in which the reflux happens often, causes frequent or severe symptoms, or causes problems such as damage to the esophagus.  Treatment for this condition may vary depending on how severe your symptoms are. Your health care provider may recommend diet and lifestyle changes, medicine, or surgery.  Contact a health care provider if you have new or worsening symptoms.  Take over-the-counter and prescription medicines only as told by your health care provider. Do not take aspirin, ibuprofen, or other NSAIDs  unless your health care provider told you to do so.  Keep all follow-up visits as told by your health care provider. This is important.  This information is not intended to replace advice given to you by your health care provider. Make sure you discuss any questions you have with your health care provider.  Document Revised: 01/26/2020 Document Reviewed: 01/28/2020  Elsevier Patient Education  2024 [employer].

## 2023-04-28 ENCOUNTER — Encounter: Payer: Self-pay | Admitting: Nurse Practitioner

## 2023-04-28 DIAGNOSIS — R0789 Other chest pain: Secondary | ICD-10-CM | POA: Insufficient documentation

## 2023-04-28 DIAGNOSIS — K219 Gastro-esophageal reflux disease without esophagitis: Secondary | ICD-10-CM | POA: Insufficient documentation

## 2023-04-29 ENCOUNTER — Telehealth: Payer: Self-pay

## 2023-04-29 NOTE — Telephone Encounter (Addendum)
-----   Message from Campbell Riches sent at 04/29/2023  4:39 PM EDT ----- Radiologist has not reviewed chest xray so Dr. Lorin Picket and I reviewed his chest xray. No rib fractures or pneumonia noted.   --- called patient and informed per provider result notes and recommendations.

## 2023-05-01 ENCOUNTER — Other Ambulatory Visit: Payer: Self-pay | Admitting: Family Medicine

## 2023-05-17 ENCOUNTER — Other Ambulatory Visit: Payer: Self-pay | Admitting: Family Medicine

## 2023-05-17 DIAGNOSIS — M47816 Spondylosis without myelopathy or radiculopathy, lumbar region: Secondary | ICD-10-CM | POA: Diagnosis not present

## 2023-05-20 ENCOUNTER — Other Ambulatory Visit: Payer: Self-pay | Admitting: Nurse Practitioner

## 2023-06-14 ENCOUNTER — Other Ambulatory Visit: Payer: Self-pay | Admitting: Family Medicine

## 2023-06-14 DIAGNOSIS — E1140 Type 2 diabetes mellitus with diabetic neuropathy, unspecified: Secondary | ICD-10-CM

## 2023-06-14 DIAGNOSIS — Z79891 Long term (current) use of opiate analgesic: Secondary | ICD-10-CM | POA: Diagnosis not present

## 2023-06-14 DIAGNOSIS — M47816 Spondylosis without myelopathy or radiculopathy, lumbar region: Secondary | ICD-10-CM | POA: Diagnosis not present

## 2023-06-14 DIAGNOSIS — I10 Essential (primary) hypertension: Secondary | ICD-10-CM

## 2023-06-14 DIAGNOSIS — R350 Frequency of micturition: Secondary | ICD-10-CM

## 2023-06-14 DIAGNOSIS — G894 Chronic pain syndrome: Secondary | ICD-10-CM | POA: Diagnosis not present

## 2023-06-15 ENCOUNTER — Other Ambulatory Visit: Payer: Self-pay | Admitting: Cardiology

## 2023-06-15 DIAGNOSIS — E782 Mixed hyperlipidemia: Secondary | ICD-10-CM

## 2023-06-16 ENCOUNTER — Other Ambulatory Visit: Payer: Self-pay | Admitting: Family Medicine

## 2023-06-17 ENCOUNTER — Other Ambulatory Visit (HOSPITAL_COMMUNITY): Payer: 59

## 2023-06-17 ENCOUNTER — Encounter (HOSPITAL_COMMUNITY): Payer: 59

## 2023-06-17 ENCOUNTER — Ambulatory Visit (INDEPENDENT_AMBULATORY_CARE_PROVIDER_SITE_OTHER): Payer: 59 | Admitting: Physician Assistant

## 2023-06-17 ENCOUNTER — Ambulatory Visit (INDEPENDENT_AMBULATORY_CARE_PROVIDER_SITE_OTHER)
Admission: RE | Admit: 2023-06-17 | Discharge: 2023-06-17 | Disposition: A | Discharge status: Home / Self Care | Payer: 59 | Source: Ambulatory Visit | Attending: Vascular Surgery | Admitting: Vascular Surgery

## 2023-06-17 ENCOUNTER — Ambulatory Visit (HOSPITAL_COMMUNITY)
Admission: RE | Admit: 2023-06-17 | Discharge: 2023-06-17 | Disposition: A | Discharge status: Home / Self Care | Payer: 59 | Source: Ambulatory Visit | Attending: Vascular Surgery

## 2023-06-17 ENCOUNTER — Ambulatory Visit: Payer: 59

## 2023-06-17 VITALS — BP 127/76 | HR 70 | Temp 98.7°F | Ht 69.0 in | Wt 219.7 lb

## 2023-06-17 DIAGNOSIS — I70213 Atherosclerosis of native arteries of extremities with intermittent claudication, bilateral legs: Secondary | ICD-10-CM

## 2023-06-17 DIAGNOSIS — I739 Peripheral vascular disease, unspecified: Secondary | ICD-10-CM | POA: Insufficient documentation

## 2023-06-17 DIAGNOSIS — Z951 Presence of aortocoronary bypass graft: Secondary | ICD-10-CM

## 2023-06-17 LAB — VAS US ABI WITH/WO TBI
Left ABI: 0.75
Right ABI: 0.96

## 2023-06-17 NOTE — Progress Notes (Signed)
[facility] [place] HISTORY AND PHYSICAL   History of Present Illness:  Patient is a 67 y.o. year old male who presents for evaluation of  Right leg critical limb ischemia with rest pain  S/P Right leg Femoral to posterior tibial artery bypass using ipsilateral, non-reversed greater saphenous vein.   He is very pleased with his outcome and states he can walk as far as he likes without claudication or rest pain.  He did stub his left GT and has a new wound. He states it is getting smaller.  He is medically managed on ASA and Statin daily.  He continue to use tobacco daily and is not willing to stop.     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 Left > Right   Esophageal ulcer    Ex-smoker    Quit 11/07/2011   Gastric ulcer    GERD (gastroesophageal reflux disease)    High cholesterol    History of colon polyps    Hyperlipidemia    Hypertension    Primary localized osteoarthritis of right knee    PVD (peripheral vascular disease) with claudication (HCC) 05/21/2018   Under the care of vascular surgery   Sleep apnea     Past Surgical History:  Procedure Laterality Date   ABDOMINAL AORTOGRAM W/LOWER EXTREMITY N/A 05/19/2018   Procedure: ABDOMINAL AORTOGRAM W/LOWER EXTREMITY;  Surgeon: Sherren Kerns, MD;  Location: [facility];  Service: Cardiovascular;  Laterality: N/A;   ABDOMINAL AORTOGRAM W/LOWER EXTREMITY Right 12/08/2022   Procedure: ABDOMINAL AORTOGRAM W/LOWER EXTREMITY;  Surgeon: [name], MD;  Location: [facility] INVASIVE CV LAB;  Service: Cardiovascular;  Laterality: Right;   BIOPSY  07/08/2022   Procedure: BIOPSY;  Surgeon: Dolores Frame, MD;  Location: [facility];  Service: Gastroenterology;;   CARPAL TUNNEL RELEASE     right   CARPAL TUNNEL  RELEASE Left 05/16/13   Dr Thurston Hole   COLONOSCOPY  01/31/2012   Procedure: COLONOSCOPY;  Surgeon: Corbin Ade, MD;  Location: [facility];  Service: Endoscopy;  Laterality: N/A;  7:30 AM   ESOPHAGEAL DILATION N/A 07/08/2022   Procedure: ESOPHAGEAL DILATION;  Surgeon: Dolores Frame, MD;  Location: [facility];  Service: Gastroenterology;  Laterality: N/A;   ESOPHAGOGASTRODUODENOSCOPY (EGD) WITH PROPOFOL N/A 07/08/2022   Procedure: ESOPHAGOGASTRODUODENOSCOPY (EGD) WITH PROPOFOL;  Surgeon: Dolores Frame, MD;  Location: [facility];  Service: Gastroenterology;  Laterality: N/A;   FEMORAL-TIBIAL BYPASS GRAFT Right 12/13/2022   Procedure: RIGHT COMMON FEMORAL-POSTERIOR TIBIAL ARTERY BYPASS WITH VEIN;  Surgeon: [name], MD;  Location: [facility] OR;  Service: Vascular;  Laterality: Right;   FINGER AMPUTATION     partial left 5th finger   JOINT REPLACEMENT     KNEE ARTHROSCOPY     right knee   ORIF FINGER FRACTURE     Right 5th finger, Amputation reatachment   PERIPHERAL VASCULAR INTERVENTION Right 05/19/2018   Procedure: PERIPHERAL VASCULAR INTERVENTION;  Surgeon: Sherren Kerns, MD;  Location: [facility];  Service: Cardiovascular;  Laterality: Right;  right popliteal   TONSILLECTOMY     TOTAL KNEE ARTHROPLASTY  09/04/2012   Procedure: TOTAL KNEE ARTHROPLASTY;  Surgeon: Nilda Simmer, MD;  Location: [facility] OR;  Service: Orthopedics;  Laterality: Left;   TOTAL KNEE ARTHROPLASTY Right 07/21/2015   Procedure: RIGHT TOTAL KNEE ARTHROPLASTY;  Surgeon: Salvatore Marvel, MD;  Location[facility] OR;  Service: Orthopedics;  Laterality: Right;    ROS:   General:  No weight loss, Fever, chills  HEENT: No recent headaches, no nasal bleeding, no visual changes, no sore throat  Neurologic: No dizziness, blackouts, seizures. No recent symptoms of stroke or mini- stroke. No recent episodes of slurred speech, or temporary blindness.  Cardiac: No recent episodes of chest  pain/pressure, no shortness of breath at rest.  No shortness of breath with exertion.  Denies history of atrial fibrillation or irregular heartbeat  Vascular: poasitive history of rest pain in feet.  No history of claudication.  No history of non-healing ulcer, No history of DVT   Pulmonary: No home oxygen, no productive cough, no hemoptysis,  No asthma or wheezing  Musculoskeletal:  [ ]  Arthritis, [ ]  Low back pain,  [ ]  Joint pain  Hematologic:No history of hypercoagulable state.  No history of easy bleeding.  No history of anemia  Gastrointestinal: No hematochezia or melena,  No gastroesophageal reflux, no trouble swallowing  Urinary: [ ]  chronic Kidney disease, [ ]  on HD - [ ]  MWF or [ ]  TTHS, [ ]  Burning with urination, [ ]  Frequent urination, [ ]  Difficulty urinating;   Skin: No rashes  Psychological: No history of anxiety,  No history of depression  Social History Social History   Tobacco Use   Smoking status: Every Day    Current packs/day: 1.00    Average packs/day: 1 pack/day for 40.0 years (40.0 ttl pk-yrs)    Types: Cigarettes    Passive exposure: Current   Smokeless tobacco: Never  Vaping Use   Vaping status: Never Used  Substance Use Topics   Alcohol use: Not Currently   Drug use: No    Family History Family History  Problem Relation Age of Onset   Heart disease Mother    Heart disease Father    Diabetes type I Father    Hypertension Father    Cancer Father    Heart disease Sister    Heart disease Brother     Allergies  Allergies  Allergen Reactions   Doxycycline Nausea Only    PATIENT STILL TOLERATES   Pravastatin Other (See Comments)    myalgia     Current Outpatient Medications  Medication Sig Dispense Refill   acetaminophen (TYLENOL) 500 MG tablet Take 500-1,000 mg by mouth every 6 (six) hours as needed (pain.).     albuterol (VENTOLIN HFA) 108 (90 Base) MCG/ACT inhaler Inhale 2 puffs into the lungs every 6 (six) hours as needed for  wheezing. 1 each 6   aspirin EC 81 MG tablet Take 1 tablet (81 mg total) by mouth daily. Swallow whole. 150 tablet 2   blood glucose meter kit and supplies Dispense based on patient and insurance preference. Use to check glucose once daily as directed. (FOR ICD-10 E11.9). 1 each 0   buPROPion (WELLBUTRIN XL) 150 MG 24 hr tablet TAKE 1 TABLET BY MOUTH EVERY DAY 90 tablet 1   clopidogrel (PLAVIX) 75 MG tablet TAKE 1 TABLET BY MOUTH EVERY DAY 90 tablet 1   empagliflozin (JARDIANCE) 10 MG TABS tablet Take 1 tablet (10 mg total) by mouth daily before breakfast. 90 tablet 1   ezetimibe (ZETIA) 10 MG tablet TAKE 1 TABLET BY MOUTH EVERY DAY 90 tablet 1  FLUoxetine (PROZAC) 20 MG capsule TAKE 3 CAPSULES BY MOUTH EVERY DAY 270 capsule 1   gabapentin (NEURONTIN) 400 MG capsule Take 400 mg by mouth at bedtime.     glipiZIDE (GLUCOTROL XL) 2.5 MG 24 hr tablet TAKE 1 TABLET BY MOUTH EVERY DAY IN THE MORNING 90 tablet 1   Lancets (ONETOUCH ULTRASOFT) lancets      metFORMIN (GLUCOPHAGE) 500 MG tablet TAKE 2 TABLET BY MOUTH EVERY MORNING AND EVENING     naloxone (NARCAN) nasal spray 4 mg/0.1 mL Use as directed 1 each 0   ONETOUCH VERIO test strip USE TO CHECK BLOOD SUGAR LEVELS ONCE DAILY 50 strip 0   Oxycodone HCl 20 MG TABS Take 20 mg by mouth every 4 (four) hours as needed (pain.). Take 1 tablet by mouth 4-5 times daily if tolerated     pantoprazole (PROTONIX) 40 MG tablet TAKE 1 TABLET (40 MG TOTAL) BY MOUTH DAILY. FOR ACID REFLUX 90 tablet 1   rosuvastatin (CRESTOR) 5 MG tablet TAKE 1 TABLET BY MOUTH EVERYDAY AT BEDTIME 90 tablet 2   traZODone (DESYREL) 50 MG tablet TAKE 1 TABLET BY MOUTH EVERY DAY AT BEDTIME AS NEEDED FOR INSOMNIA 90 tablet 1   No current facility-administered medications for this visit.    Physical Examination  Vitals:   06/17/23 0819  BP: 127/76  Pulse: 70  Temp: 98.7 F (37.1 C)  TempSrc: Temporal  SpO2: 90%  Weight: 219 lb 11.2 oz (99.7 kg)  Height: 5\' 9"  (1.753 m)     Body mass index is 32.44 kg/m.  General:  Alert and oriented, no acute distress HEENT: Normal Neck: No bruit or JVD Pulmonary: left LL rhonchi to auscultation, non labored breathing Cardiac: Regular Rate and Rhythm without murmur Abdomen: Soft, non-tender, non-distended, no mass, no scars Skin: No rash     Extremity Pulses:  radial,  femoral, pulses bilaterally Musculoskeletal: No deformity or min B LE edema  Neurologic: Upper and lower extremity motor 5/5 and symmetric  DATA:   ABI Findings:  +---------+------------------+-----+----------+--------+  Right   Rt Pressure (mmHg)IndexWaveform  Comment   +---------+------------------+-----+----------+--------+  Brachial 135                                        +---------+------------------+-----+----------+--------+  ATA     99                0.72 monophasic          +---------+------------------+-----+----------+--------+  PTA     131               0.96 monophasic          +---------+------------------+-----+----------+--------+  Great Toe73                0.53                     +---------+------------------+-----+----------+--------+   +---------+------------------+-----+----------+-------+  Left    Lt Pressure (mmHg)IndexWaveform  Comment  +---------+------------------+-----+----------+-------+  Brachial 137                                       +---------+------------------+-----+----------+-------+  ATA     72                0.53 monophasic         +---------+------------------+-----+----------+-------+  PTA     103               0.75 monophasic         +---------+------------------+-----+----------+-------+  Great Toe43                0.31                    +---------+------------------+-----+----------+-------+   +-------+-----------+-----------+------------+------------+  ABI/TBIToday's ABIToday's TBIPrevious ABIPrevious TBI   +-------+-----------+-----------+------------+------------+  Right 0.96       0.53       0.82        0.41          +-------+-----------+-----------+------------+------------+  Left  0.75       0.31       0.64        0.33          +-------+-----------+-----------+------------+------------+        I    Right Graft #1: CFA to PTA +------------------+--------+--------+----------+--------+                   PSV cm/sStenosisWaveform  Comments +------------------+--------+--------+----------+--------+ Inflow            143             monophasic         +------------------+--------+--------+----------+--------+ Prox Anastomosis  202             monophasic         +------------------+--------+--------+----------+--------+ Proximal Graft    87              triphasic          +------------------+--------+--------+----------+--------+ Mid Graft         85              monophasic         +------------------+--------+--------+----------+--------+ Distal Graft      104             monophasic         +------------------+--------+--------+----------+--------+ Distal Anastomosis136             monophasic         +------------------+--------+--------+----------+--------+ Outflow           163             monophasic         +------------------+--------+--------+----------+--------+    Summary: Right: Patent right CFA to PTA bypass graft with no stenosis identified. Unable to obtain higher outflow velocity as seen on previous exam.       ASSESSMENT/PLAN: S/P  Right leg Femoral to posterior tibial artery bypass using ipsilateral, non-reversed greater saphenous vein. This was done for CLI with rest pain.  He states he no longer has rest pain or claudication in the right LE.  The ABI's show improvement.  The incision have healed and he can walk as far as he likes.   New left GT wound 2 weeks ago je stubbed his toe.  He states it is  improving and smaller in size.    Angiogram showed patent large vessels the left LE ABI's demonstrate tibial disease.  I will have him follow up in [place] in 4 weeks for wound check.  If it has not healed he may need an angiogram on the left to improve inflow for healing.    On the left: Widely patent common iliac artery, hypogastric artery, external neck artery, common femoral artery, proximal superficial femoral artery, proximal profunda  Mosetta Pigeon PA-C [facility] [place] Office: [phone]  MD in clinic [place]

## 2023-06-20 ENCOUNTER — Other Ambulatory Visit: Payer: Self-pay | Admitting: Family Medicine

## 2023-06-20 DIAGNOSIS — E1140 Type 2 diabetes mellitus with diabetic neuropathy, unspecified: Secondary | ICD-10-CM

## 2023-06-20 DIAGNOSIS — I10 Essential (primary) hypertension: Secondary | ICD-10-CM

## 2023-06-24 ENCOUNTER — Ambulatory Visit (INDEPENDENT_AMBULATORY_CARE_PROVIDER_SITE_OTHER): Payer: 59

## 2023-06-24 VITALS — Ht 69.0 in | Wt 219.0 lb

## 2023-06-24 DIAGNOSIS — Z0000 Encounter for general adult medical examination without abnormal findings: Secondary | ICD-10-CM | POA: Diagnosis not present

## 2023-06-24 NOTE — Patient Instructions (Signed)
Mr. Orders , Thank you for taking time to come for your Medicare Wellness Visit. I appreciate your ongoing commitment to your health goals. Please review the following plan we discussed and let me know if I can assist you in the future.   Referrals/Orders/Follow-Ups/Clinician Recommendations: Aim for 30 minutes of exercise or brisk walking, 6-8 glasses of water, and 5 servings of fruits and vegetables each day.  This is a list of the screening recommended for you and due dates:  Health Maintenance  Topic Date Due   Eye exam for diabetics  10/02/2022   Yearly kidney health urinalysis for diabetes  02/10/2023   DTaP/Tdap/Td vaccine (2 - Td or Tdap) 03/18/2023   Hemoglobin A1C  05/03/2023   Zoster (Shingles) Vaccine (1 of 2) 07/28/2023*   Flu Shot  10/31/2023*   Screening for Lung Cancer  04/27/2024*   Complete foot exam   11/01/2023   Yearly kidney function blood test for diabetes  01/06/2024   Medicare Annual Wellness Visit  06/23/2024   Cologuard (Stool DNA test)  07/19/2025   Pneumonia Vaccine  Completed   Hepatitis C Screening  Completed   HPV Vaccine  Aged Out   Colon Cancer Screening  Discontinued   COVID-19 Vaccine  Discontinued  *Topic was postponed. The date shown is not the original due date.    Advanced directives: (ACP Link)Information on Advanced Care Planning can be found at [facility] [place] Advance Health Care Directives Advance Health Care Directives ([email or web address]   Next Medicare Annual Wellness Visit scheduled for next year: Yes

## 2023-06-24 NOTE — Progress Notes (Signed)
Subjective:   Jim [name] is a 67 y.o. male who presents for Medicare Annual/Subsequent preventive examination.  Visit Complete: Virtual I connected with  [name] on 06/24/23 by a audio enabled telemedicine application and verified that I am speaking with the correct person using two identifiers.  Patient Location: Home  Provider Location: Home Office  I discussed the limitations of evaluation and management by telemedicine. The patient expressed understanding and agreed to proceed.  Vital Signs: Because this visit was a virtual/telehealth visit, some criteria may be missing or patient reported. Any vitals not documented were not able to be obtained and vitals that have been documented are patient reported.  Cardiac Risk Factors include: advanced age (>33men, >42 women);diabetes mellitus;dyslipidemia;male gender;smoking/ tobacco exposure     Objective:    Today's Vitals   06/24/23 0951  Weight: 219 lb (99.3 kg)  Height: 5\' 9"  (1.753 m)   Body mass index is 32.34 kg/m.     06/24/2023   10:51 AM 12/13/2022    8:00 PM 12/08/2022   10:18 AM 07/08/2022    1:27 PM 07/08/2022   12:57 PM 07/07/2022    8:58 AM 05/31/2022    9:53 AM  Advanced Directives  Does Patient Have a Medical Advance Directive? No No No No  No No  Would patient like information on creating a medical advance directive? Yes (MAU/Ambulatory/Procedural Areas - Information given) No - Patient declined No - Patient declined No - Patient declined No - Patient declined  No - Patient declined    Current Medications (verified) Outpatient Encounter Medications as of 06/24/2023  Medication Sig   acetaminophen (TYLENOL) 500 MG tablet Take 500-1,000 mg by mouth every 6 (six) hours as needed (pain.).   albuterol (VENTOLIN HFA) 108 (90 Base) MCG/ACT inhaler Inhale 2 puffs into the lungs every 6 (six) hours as needed for wheezing.   aspirin EC 81 MG tablet Take 1 tablet (81 mg total) by mouth daily. Swallow whole.    blood glucose meter kit and supplies Dispense based on patient and insurance preference. Use to check glucose once daily as directed. (FOR ICD-10 E11.9).   buPROPion (WELLBUTRIN XL) 150 MG 24 hr tablet TAKE 1 TABLET BY MOUTH EVERY DAY   clopidogrel (PLAVIX) 75 MG tablet TAKE 1 TABLET BY MOUTH EVERY DAY   ezetimibe (ZETIA) 10 MG tablet TAKE 1 TABLET BY MOUTH EVERY DAY   FLUoxetine (PROZAC) 20 MG capsule TAKE 3 CAPSULES BY MOUTH EVERY DAY   gabapentin (NEURONTIN) 400 MG capsule Take 400 mg by mouth at bedtime.   glipiZIDE (GLUCOTROL XL) 2.5 MG 24 hr tablet TAKE 1 TABLET BY MOUTH EVERY DAY IN THE MORNING   JARDIANCE 10 MG TABS tablet TAKE 1 TABLET BY MOUTH DAILY BEFORE BREAKFAST.   Lancets (ONETOUCH ULTRASOFT) lancets    metFORMIN (GLUCOPHAGE) 500 MG tablet TAKE 2 TABLET BY MOUTH EVERY MORNING AND EVENING   naloxone (NARCAN) nasal spray 4 mg/0.1 mL Use as directed   ONETOUCH VERIO test strip USE TO CHECK BLOOD SUGAR LEVELS ONCE DAILY   Oxycodone HCl 20 MG TABS Take 20 mg by mouth every 4 (four) hours as needed (pain.). Take 1 tablet by mouth 4-5 times daily if tolerated   pantoprazole (PROTONIX) 40 MG tablet TAKE 1 TABLET (40 MG TOTAL) BY MOUTH DAILY. FOR ACID REFLUX   rosuvastatin (CRESTOR) 5 MG tablet TAKE 1 TABLET BY MOUTH EVERYDAY AT BEDTIME   traZODone (DESYREL) 50 MG tablet TAKE 1 TABLET BY MOUTH EVERY DAY  AT BEDTIME AS NEEDED FOR INSOMNIA   No facility-administered encounter medications on file as of 06/24/2023.    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 Left > Right   Esophageal ulcer    Ex-smoker    Quit 11/07/2011   Gastric ulcer    GERD (gastroesophageal reflux disease)    High cholesterol    History of colon polyps     Hyperlipidemia    Hypertension    Primary localized osteoarthritis of right knee    PVD (peripheral vascular disease) with claudication (HCC) 05/21/2018   Under the care of vascular surgery   Sleep apnea    Past Surgical History:  Procedure Laterality Date   ABDOMINAL AORTOGRAM W/LOWER EXTREMITY N/A 05/19/2018   Procedure: ABDOMINAL AORTOGRAM W/LOWER EXTREMITY;  Surgeon: Sherren Kerns, MD;  Location: [facility] INVASIVE CV LAB;  Service: Cardiovascular;  Laterality: N/A;   ABDOMINAL AORTOGRAM W/LOWER EXTREMITY Right 12/08/2022   Procedure: ABDOMINAL AORTOGRAM W/LOWER EXTREMITY;  Surgeon: [name], MD;  Location: [facility] INVASIVE CV LAB;  Service: Cardiovascular;  Laterality: Right;   BIOPSY  07/08/2022   Procedure: BIOPSY;  Surgeon: Dolores Frame, MD;  Location: [facility] ENDO SUITE;  Service: Gastroenterology;;   CARPAL TUNNEL RELEASE     right   CARPAL TUNNEL RELEASE Left 05/16/13   Dr Thurston Hole   COLONOSCOPY  01/31/2012   Procedure: COLONOSCOPY;  Surgeon: Corbin Ade, MD;  Location: [facility] ENDO [facility];  Service: Endoscopy;  Laterality: N/A;  7:30 AM   ESOPHAGEAL DILATION N/A 07/08/2022   Procedure: ESOPHAGEAL DILATION;  Surgeon: Dolores Frame, MD;  Location: [facility];  Service: Gastroenterology;  Laterality: N/A;   ESOPHAGOGASTRODUODENOSCOPY (EGD) WITH PROPOFOL N/A 07/08/2022   Procedure: ESOPHAGOGASTRODUODENOSCOPY (EGD) WITH PROPOFOL;  Surgeon: Dolores Frame, MD;  Location: [facility];  Service: Gastroenterology;  Laterality: N/A;   FEMORAL-TIBIAL BYPASS GRAFT Right 12/13/2022   Procedure: RIGHT COMMON FEMORAL-POSTERIOR TIBIAL ARTERY BYPASS WITH VEIN;  Surgeon: [name], MD;  Location: [facility] OR;  Service: Vascular;  Laterality: Right;   FINGER AMPUTATION     partial left 5th finger   JOINT REPLACEMENT     KNEE ARTHROSCOPY     right knee   ORIF FINGER FRACTURE     Right 5th finger, Amputation reatachment   PERIPHERAL VASCULAR INTERVENTION Right  05/19/2018   Procedure: PERIPHERAL VASCULAR INTERVENTION;  Surgeon: Sherren Kerns, MD;  Location: [facility] IN[facility];  Service: Cardiovascular;  Laterality: Right;  right popliteal   TONSILLECTOMY     TOTAL KNEE ARTHROPLASTY  09/04/2012   Procedure: TOTAL KNEE ARTHROPLASTY;  Surgeon: Nilda Simmer, MD;  Location: [facility] OR;  Service: Orthopedics;  Laterality: Left;   TOTAL KNEE ARTHROPLASTY Right 07/21/2015   Procedure: RIGHT TOTAL KNEE ARTHROPLASTY;  Surgeon: Salvatore Marvel, MD;  Location: [facility] OR;  Service: Orthopedics;  Laterality: Right;   Family History  Problem Relation Age of Onset   Heart disease Mother    Heart disease Father    Diabetes type I Father    Hypertension Father    Cancer Father    Diabetes Father    Heart disease Sister    Heart disease Brother  Social History   Socioeconomic History   Marital status: Married    Spouse name: Not on file   Number of children: Not on file   Years of education: Not on file   Highest education level: 12th grade  Occupational History   Occupation: disabled    Employer: OTHER  Tobacco Use   Smoking status: Every Day    Current packs/day: 1.00    Average packs/day: 1.1 packs/day for 53.3 years (60.0 ttl pk-yrs)    Types: Cigarettes    Passive exposure: Current   Smokeless tobacco: Never  Vaping Use   Vaping status: Never Used  Substance and Sexual Activity   Alcohol use: Not Currently   Drug use: No   Sexual activity: Not Currently  Other Topics Concern   Not on file  Social History Narrative   Not on file   Social Determinants of Health   Financial Resource Strain: Medium Risk (06/21/2023)   Overall Financial Resource Strain (CARDIA)    Difficulty of Paying Living Expenses: Somewhat hard  Food Insecurity: Food Insecurity Present (06/21/2023)   Hunger Vital Sign    Worried About Running Out of Food in the Last Year: Sometimes true    Ran Out of Food in the Last Year: Sometimes true  Transportation Needs: No  Transportation Needs (06/21/2023)   PRAPARE - Administrator, Civil Service (Medical): No    Lack of Transportation (Non-Medical): No  Physical Activity: Inactive (06/21/2023)   Exercise Vital Sign    Days of Exercise per Week: 0 days    Minutes of Exercise per Session: 0 min  Stress: No Stress Concern Present (06/21/2023)   [employer] of Occupational Health - Occupational Stress Questionnaire    Feeling of Stress : Only a little  Social Connections: Moderately Integrated (06/21/2023)   Social Connection and Isolation Panel [NHANES]    Frequency of Communication with Friends and Family: More than three times a week    Frequency of Social Gatherings with Friends and Family: More than three times a week    Attends Religious Services: Never    Database administrator or Organizations: Yes    Attends Engineer, structural: Patient declined    Marital Status: Married    Tobacco Counseling Ready to quit: Not Answered Counseling given: Not Answered   Clinical Intake:  Pre-visit preparation completed: Yes  Pain : No/denies pain  Diabetes: Yes CBG done?: No Did pt. bring in CBG monitor from home?: No  How often do you need to have someone help you when you read instructions, pamphlets, or other written materials from your doctor or pharmacy?: 1 - Never  Interpreter Needed?: No  Information entered by :: Kandis Fantasia LPN   Activities of Daily Living    06/21/2023    7:40 AM 12/13/2022   10:25 AM  In your present state of health, do you have any difficulty performing the following activities:  Hearing? 1   Vision? 0   Difficulty concentrating or making decisions? 1   Walking or climbing stairs? 1   Dressing or bathing? 0   Doing errands, shopping? 0 0  Preparing Food and eating ? N   Using the Toilet? N   In the past six months, have you accidently leaked urine? Y   Do you have problems with loss of bowel control? Y   Managing your  Medications? Y   Managing your Finances? Y   Housekeeping or managing your Housekeeping? Jeannie Fend  Patient Care Team: Babs Sciara, MD as PCP - General (Family Medicine)  Indicate any recent Medical Services you may have received from other than [facility] providers in the past year (date may be approximate).     Assessment:   This is a routine wellness examination for Jourden.  Hearing/Vision screen Hearing Screening - Comments:: Denies hearing difficulties   Vision Screening - Comments:: Wears rx glasses - up to date with routine eye exams with [facility]     Goals Addressed             This Visit's Progress    Remain active and independent        Depression Screen    06/24/2023   10:43 AM 04/12/2023    9:13 AM 01/07/2023    9:26 AM 12/24/2022    1:21 PM 07/15/2022   11:58 AM 05/31/2022    9:52 AM 10/20/2021    4:26 PM  PHQ 2/9 Scores  PHQ - 2 Score 0 0 2 4 0 0 2  PHQ- 9 Score  1 6 6   0 4    Fall Risk    06/21/2023    7:40 AM 04/12/2023    9:12 AM 01/07/2023    9:26 AM 12/24/2022    1:21 PM 07/15/2022   11:57 AM  Fall Risk   Falls in the past year? 1 0 0 1 1  Number falls in past yr: 1  0 0 1  Injury with Fall? 1  0 1 1  Risk for fall due to : History of fall(s);Impaired balance/gait;Impaired mobility   History of fall(s)   Follow up Falls prevention discussed;Education provided;Falls evaluation completed   Falls evaluation completed     MEDICARE RISK AT HOME: Medicare Risk at Home Any stairs in or around the home?: Yes If so, are there any without handrails?: No Home free of loose throw rugs in walkways, pet beds, electrical cords, etc?: Yes Adequate lighting in your home to reduce risk of falls?: Yes Life alert?: No Use of a cane, walker or w/c?: Yes Grab bars in the bathroom?: No Shower chair or bench in shower?: No Elevated toilet seat or a handicapped toilet?: No  TIMED UP AND GO:  Was the test performed?  No    Cognitive Function:       08/19/2017   12:18 PM  Montreal Cognitive Assessment   Visuospatial/ Executive (0/5) 5  Naming (0/3) 3  Attention: Read list of digits (0/2) 2  Attention: Read list of letters (0/1) 1  Attention: Serial 7 subtraction starting at 100 (0/3) 3  Language: Repeat phrase (0/2) 2  Language : Fluency (0/1) 1  Abstraction (0/2) 2  Delayed Recall (0/5) 2  Orientation (0/6) 6  Total 27  Adjusted Score (based on education) 28      06/24/2023   10:54 AM 05/31/2022    9:57 AM  6CIT Screen  What Year? 0 points 0 points  What month? 0 points 0 points  What time? 0 points 0 points  Count back from 20 0 points 0 points  Months in reverse 0 points 0 points  Repeat phrase 0 points 0 points  Total Score 0 points 0 points    Immunizations Immunization History  Administered Date(s) Administered   Fluad Quad(high Dose 65+) 04/30/2020, 05/27/2021   Influenza,inj,Quad PF,6+ Mos 05/12/2016, 06/09/2017, 06/14/2018, 05/29/2019   PFIZER Comirnaty(Gray Top)Covid-19 Tri-Sucrose Vaccine 07/31/2020   Pneumococcal Conjugate-13 01/07/2020   Pneumococcal Polysaccharide-23 08/13/2016, 06/11/2020   Tdap  03/17/2013    TDAP status: Due, Education has been provided regarding the importance of this vaccine. Advised may receive this vaccine at local pharmacy or Health Dept. Aware to provide a copy of the vaccination record if obtained from local pharmacy or Health Dept. Verbalized acceptance and understanding.  Flu Vaccine status: Declined, Education has been provided regarding the importance of this vaccine but patient still declined. Advised may receive this vaccine at local pharmacy or Health Dept. Aware to provide a copy of the vaccination record if obtained from local pharmacy or Health Dept. Verbalized acceptance and understanding.  Pneumococcal vaccine status: Up to date  Covid-19 vaccine status: Information provided on how to obtain vaccines.   Qualifies for Shingles Vaccine? Yes   Zostavax  completed No   Shingrix Completed?: No.    Education has been provided regarding the importance of this vaccine. Patient has been advised to call insurance company to determine out of pocket expense if they have not yet received this vaccine. Advised may also receive vaccine at local pharmacy or Health Dept. Verbalized acceptance and understanding.  Screening Tests Health Maintenance  Topic Date Due   OPHTHALMOLOGY EXAM  10/02/2022   Diabetic kidney evaluation - Urine ACR  02/10/2023   DTaP/Tdap/Td (2 - Td or Tdap) 03/18/2023   HEMOGLOBIN A1C  05/03/2023   Zoster Vaccines- Shingrix (1 of 2) 07/28/2023 (Originally 11/06/1974)   INFLUENZA VACCINE  10/31/2023 (Originally 03/03/2023)   Lung Cancer Screening  04/27/2024 (Originally 11/06/2004)   FOOT EXAM  11/01/2023   Diabetic kidney evaluation - eGFR measurement  01/06/2024   Medicare Annual Wellness (AWV)  06/23/2024   Fecal DNA (Cologuard)  07/19/2025   Pneumonia Vaccine 28+ Years old  Completed   Hepatitis C Screening  Completed   HPV VACCINES  Aged Out   Colonoscopy  Discontinued   COVID-19 Vaccine  Discontinued    Health Maintenance  Health Maintenance Due  Topic Date Due   OPHTHALMOLOGY EXAM  10/02/2022   Diabetic kidney evaluation - Urine ACR  02/10/2023   DTaP/Tdap/Td (2 - Td or Tdap) 03/18/2023   HEMOGLOBIN A1C  05/03/2023    Colorectal cancer screening: Type of screening: Cologuard. Completed 07/19/22. Repeat every 3 years  Lung Cancer Screening: (Low Dose CT Chest recommended if Age 45-80 years, 20 pack-year currently smoking OR have quit w/in 15years.) does qualify.   Lung Cancer Screening Referral: will discuss with pcp at future appointment   Additional Screening:  Hepatitis C Screening: does qualify; Completed 07/22/22  Vision Screening: Recommended annual ophthalmology exams for early detection of glaucoma and other disorders of the eye. Is the patient up to date with their annual eye exam?  Yes  Who is the  provider or what is the name of the office in which the patient attends annual eye exams? [facility]  If pt is not established with a provider, would they like to be referred to a provider to establish care? No .   Dental Screening: Recommended annual dental exams for proper oral hygiene  Diabetic Foot Exam: Diabetic Foot Exam: Completed 11/01/22  Community Resource Referral / Chronic Care Management: CRR required this visit?  No   CCM required this visit?  No     Plan:     I have personally reviewed and noted the following in the patient's chart:   Medical and social history Use of alcohol, tobacco or illicit drugs  Current medications and supplements including opioid prescriptions. Patient is currently taking opioid prescriptions. Information provided to  patient regarding non-opioid alternatives. Patient advised to discuss non-opioid treatment plan with their provider. Functional ability and status Nutritional status Physical activity Advanced directives List of other physicians Hospitalizations, surgeries, and ER visits in previous 12 months Vitals Screenings to include cognitive, depression, and falls Referrals and appointments  In addition, I have reviewed and discussed with patient certain preventive protocols, quality metrics, and best practice recommendations. A written personalized care plan for preventive services as well as general preventive health recommendations were provided to patient.     Kandis Fantasia [place], [place]   [id number]/05/[id number]   After Visit Summary: (MyChart) Due to this being a telephonic visit, the after visit summary with patients personalized plan was offered to patient via MyChart   Nurse Notes: No concerns at this time

## 2023-07-05 ENCOUNTER — Institutional Professional Consult (permissible substitution) (HOSPITAL_BASED_OUTPATIENT_CLINIC_OR_DEPARTMENT_OTHER): Payer: 59 | Admitting: Internal Medicine

## 2023-07-12 DIAGNOSIS — Z79891 Long term (current) use of opiate analgesic: Secondary | ICD-10-CM | POA: Diagnosis not present

## 2023-07-12 DIAGNOSIS — G894 Chronic pain syndrome: Secondary | ICD-10-CM | POA: Diagnosis not present

## 2023-07-12 DIAGNOSIS — I10 Essential (primary) hypertension: Secondary | ICD-10-CM | POA: Diagnosis not present

## 2023-07-20 ENCOUNTER — Other Ambulatory Visit: Payer: Self-pay | Admitting: Family Medicine

## 2023-07-20 DIAGNOSIS — E119 Type 2 diabetes mellitus without complications: Secondary | ICD-10-CM

## 2023-07-20 MED ORDER — BLOOD GLUCOSE METER KIT: PACK | 0 refills | Status: DC

## 2023-07-20 MED ORDER — ONETOUCH VERIO VI STRP: ORAL_STRIP | 0 refills | Status: DC

## 2023-07-21 ENCOUNTER — Ambulatory Visit: Payer: 59

## 2023-07-21 ENCOUNTER — Other Ambulatory Visit: Payer: Self-pay

## 2023-07-21 DIAGNOSIS — E119 Type 2 diabetes mellitus without complications: Secondary | ICD-10-CM

## 2023-07-21 MED ORDER — BLOOD GLUCOSE METER KIT: PACK | 0 refills | Status: AC

## 2023-07-21 NOTE — Telephone Encounter (Signed)
He does have diabetes but it is my understanding he is not on insulin therefore continuous glucose monitors are not covered by insurance companies for those reasons

## 2023-08-02 DIAGNOSIS — H2513 Age-related nuclear cataract, bilateral: Secondary | ICD-10-CM | POA: Diagnosis not present

## 2023-08-02 DIAGNOSIS — H524 Presbyopia: Secondary | ICD-10-CM | POA: Diagnosis not present

## 2023-08-02 LAB — HM DIABETES EYE EXAM

## 2023-08-08 ENCOUNTER — Other Ambulatory Visit: Payer: Self-pay

## 2023-08-08 MED ORDER — METFORMIN HCL 500 MG PO TABS: ORAL_TABLET | ORAL | 0 refills | Status: DC

## 2023-08-15 DIAGNOSIS — M47816 Spondylosis without myelopathy or radiculopathy, lumbar region: Secondary | ICD-10-CM | POA: Diagnosis not present

## 2023-08-15 DIAGNOSIS — Z5181 Encounter for therapeutic drug level monitoring: Secondary | ICD-10-CM | POA: Diagnosis not present

## 2023-08-15 DIAGNOSIS — I10 Essential (primary) hypertension: Secondary | ICD-10-CM | POA: Diagnosis not present

## 2023-08-15 DIAGNOSIS — G894 Chronic pain syndrome: Secondary | ICD-10-CM | POA: Diagnosis not present

## 2023-08-15 DIAGNOSIS — Z79891 Long term (current) use of opiate analgesic: Secondary | ICD-10-CM | POA: Diagnosis not present

## 2023-08-18 ENCOUNTER — Other Ambulatory Visit: Payer: Self-pay | Admitting: Family Medicine

## 2023-08-25 ENCOUNTER — Other Ambulatory Visit: Payer: Self-pay

## 2023-08-25 ENCOUNTER — Telehealth: Payer: Self-pay

## 2023-08-25 DIAGNOSIS — Z122 Encounter for screening for malignant neoplasm of respiratory organs: Secondary | ICD-10-CM

## 2023-08-25 DIAGNOSIS — Z87891 Personal history of nicotine dependence: Secondary | ICD-10-CM

## 2023-08-25 DIAGNOSIS — F17210 Nicotine dependence, cigarettes, uncomplicated: Secondary | ICD-10-CM

## 2023-08-25 NOTE — Telephone Encounter (Signed)
.  Lung Cancer Screening Narrative/Criteria Questionnaire (Cigarette Smokers Only- No Cigars/Pipes/vapes)   Jim [name]   SDMV:09/06/2023 @ 10:00am with Ladonna Snide LAWSON HOGREFE, RN   12-Jun-1956   LDCT: 09/09/2023 at 10:30am at [facility]    68 y.o.   Phone: [phone]  Lung Screening Narrative (confirm age 55-77 yrs Medicare / 50-80 yrs Private pay insurance)   Insurance information:UHC   Referring Provider:[place]   This screening involves an initial phone call with a team member from our program. It is called a shared decision making visit. The initial meeting is required by  insurance and Medicare to make sure you understand the program. This appointment takes about 15-20 minutes to complete. You will complete the screening scan at your scheduled date/time.  This scan takes about 5-10 minutes to complete. You can eat and drink normally before and after the scan.  Criteria questions for Lung Cancer Screening:   Are you a current or former smoker? Current Age began smoking: 37   If you are a former smoker, what year did you quit smoking? Quit 8 years (within 15 yrs)   To calculate your smoking history, I need an accurate estimate of how many packs of cigarettes you smoked per day and for how many years. (Not just the number of PPD you are now smoking)   Years smoking 42 x Packs per day 2 = Pack years 27   (at least 20 pack yrs)   (Make sure they understand that we need to know how much they have smoked in the past, not just the number of PPD they are smoking now)  Do you have a personal history of cancer?  No    Do you have a family history of cancer? Yes  (cancer type and and relative) Father skin  Are you coughing up blood?  No  Have you had unexplained weight loss of 15 lbs or more in the last 6 months? No  It looks like you meet all criteria.  When would be a good time for [place] to schedule you for this screening?   Additional information

## 2023-08-30 ENCOUNTER — Ambulatory Visit: Payer: 59 | Admitting: Physician Assistant

## 2023-08-30 ENCOUNTER — Ambulatory Visit: Payer: Self-pay | Admitting: Family Medicine

## 2023-08-30 ENCOUNTER — Ambulatory Visit: Payer: 59 | Admitting: Family Medicine

## 2023-08-30 NOTE — Telephone Encounter (Signed)
Chief Complaint: shortness of breath Symptoms: SOB, cough (non-productive), fever, N/V/D Frequency: sick since Friday Pertinent Negatives: Patient denies CP Disposition: [x] ED /[] Urgent Care (no appt availability in office) / [] Appointment(In office/virtual)/ []  [facility]/ [] Home Care/ [x] Refused Recommended Disposition /[] [facility]/ []  Follow-up with PCP Additional Notes: Pt reports SOB, non-productive cough, fever of 101F, and N/V/D. Wife also sick. Pt endorses moderate SOB and is SOB at rest. Pt states he is having to work harder than is typical for him to breathe. Pt also sounds SOB on the phone with the nurse. Pt states his O2 with a pulse ox is 90, states this is lower than his usual. Unable to tell RN his HR. Pt denies hx of COPD. Pt denies CP. Per protocol, pt advised to go to the ED. Pt states his wife cannot take him because she is also sick. Wife jumped on the phone to say that she was seen today in office with Dr. Gerda Diss who advised her to call and make an appt for her husband, the pt. RN stated that based on the pt's SOB and O2 sat, the ED was safer. RN offered to call EMS for the pt - pt declined. RN advised pt she would route this to the appropriate people for follow-up and that the appropriate people will advise on whether PCP and office staff are comfortable seeing the pt in the office. RN also advised to call 911 if SOB worsens or for any worsening symptoms. Pt verbalized understanding.   Copied from CRM [id number]. Topic: Clinical - Red Word Triage >> Aug 30, 2023  4:46 PM Shelah Lewandowsky wrote: Red Word that prompted transfer to Nurse Triage: cough, shortness of breath, fever 101, body aches, diarrhea Reason for Disposition  [1] MODERATE difficulty breathing (e.g., speaks in phrases, SOB even at rest, pulse 100-120) AND [2] NEW-onset or WORSE than normal  Answer Assessment - Initial Assessment Questions 1. RESPIRATORY STATUS: "Describe your breathing?" (e.g.,  wheezing, shortness of breath, unable to speak, severe coughing)      Short of breath at rest, able to talk during triage, non-productive cough 2. ONSET: "When did this breathing problem begin?"      Early Friday 3. PATTERN "Does the difficult breathing come and go, or has it been constant since it started?"      Intermittent 4. SEVERITY: "How bad is your breathing?" (e.g., mild, moderate, severe)    - MILD: No SOB at rest, mild SOB with walking, speaks normally in sentences, can lie down, no retractions, pulse < 100.    - MODERATE: SOB at rest, SOB with minimal exertion and prefers to sit, cannot lie down flat, speaks in phrases, mild retractions, audible wheezing, pulse 100-120.    - SEVERE: Very SOB at rest, speaks in single words, struggling to breathe, sitting hunched forward, retractions, pulse > 120      "Moderate" (SOB at rest, wheezing, using extra muscles) 5. RECURRENT SYMPTOM: "Have you had difficulty breathing before?" If Yes, ask: "When was the last time?" and "What happened that time?"      No 6. CARDIAC HISTORY: "Do you have any history of heart disease?" (e.g., heart attack, angina, bypass surgery, angioplasty)      No 7. LUNG HISTORY: "Do you have any history of lung disease?"  (e.g., pulmonary embolus, asthma, emphysema)     No 8. CAUSE: "What do you think is causing the breathing problem?"      Not sure 9. OTHER SYMPTOMS: "Do you  have any other symptoms? (e.g., dizziness, runny nose, cough, chest pain, fever)     Fever (101F), diarrhea (drinking a lot of orange juice), vomiting, no CP (except with coughing), cough (non-productive, dry), still eating and drinking, urinating more than normal 10. O2 SATURATION MONITOR:  "Do you use an oxygen saturation monitor (pulse oximeter) at home?" If Yes, ask: "What is your reading (oxygen level) today?" "What is your usual oxygen saturation reading?" (e.g., 95%)       90  Protocols used: Breathing Difficulty-A-AH

## 2023-08-31 NOTE — Telephone Encounter (Signed)
Spoke with pt's wife regarding patient status, he is currently sleeping and is stable, states she has an o2 sat and he showed 90 with activity but better when at rest. She was advised that we may see him to evaluate him this afternoon, but she says he would not be willing to come out, she was advised to contact the office if any worsening conditions noted.

## 2023-08-31 NOTE — Telephone Encounter (Signed)
Left message to return call to speak with patient

## 2023-09-06 ENCOUNTER — Encounter: Payer: Self-pay | Admitting: Physician Assistant

## 2023-09-06 ENCOUNTER — Ambulatory Visit: Payer: 59 | Admitting: Physician Assistant

## 2023-09-06 DIAGNOSIS — F17210 Nicotine dependence, cigarettes, uncomplicated: Secondary | ICD-10-CM

## 2023-09-06 NOTE — Progress Notes (Signed)
 Virtual Visit via Telephone Note  I connected with Jim [name] on 09/06/23 at  9:57 AM  by telephone and verified that I am speaking with the correct person using two identifiers.  Location: Patient: home Provider: working virtually from home   I discussed the limitations, risks, security and privacy concerns of performing an evaluation and management service by telephone and the availability of in person appointments. I also discussed with the patient that there may be a patient responsible charge related to this service. The patient expressed understanding and agreed to proceed.     Shared Decision Making Visit Lung Cancer Screening Program [phone])   Eligibility: Age 68 Pack Years Smoking History Calculation 64 (# packs/per year x # years smoked) Recent History of coughing up blood  No Unexplained weight loss? No ( >Than 15 pounds within the last 6 months ) Prior History Lung / other cancer No (Diagnosis within the last 5 years already requiring surveillance chest CT Scans). Smoking Status Current Smoker  Visit Components: Discussion included one or more decision making aids. Yes Discussion included risk/benefits of screening. Yes Discussion included potential follow up diagnostic testing for abnormal scans. Yes Discussion included meaning and risk of over diagnosis. Yes Discussion included meaning and risk of False Positives. Yes Discussion included meaning of total radiation exposure. Yes  Counseling Included: Importance of adherence to annual lung cancer LDCT screening. Yes Impact of comorbidities on ability to participate in the program. Yes Ability and willingness to under diagnostic treatment: Yes  Smoking Cessation Counseling: Current Smokers:  Discussed importance of smoking cessation. Yes Information about tobacco cessation classes and interventions provided to patient. Yes Symptomatic Patient. No Diagnosis Code: Tobacco Use Z72.0 Asymptomatic Patient  Yes  Counseling (Intermediate counseling: > three minutes counseling) H9563 Information about tobacco cessation classes and interventions provided to patient. Yes Written Order for Lung Cancer Screening with LDCT placed in Epic. Yes (CT Chest Lung Cancer Screening Low Dose W/O CM) PFH4422 Z12.2-Screening of respiratory organs Z87.891-Personal history of nicotine dependence   I have spent 25 minutes of face to face/ virtual visit  time with the patient discussing the risks and benefits of lung cancer screening. We discussed the above noted topics. We paused at intervals to allow for questions to be asked and answered to ensure understanding.We discussed that the single most powerful action that anyone can take to decrease their risk of developing lung cancer is to quit smoking.  We discussed options for tools to aid in quitting smoking including nicotine replacement therapy, non-nicotine medications, support groups, Quit Smart classes, and behavior modification. We discussed that often times setting smaller, more achievable goals, such as eliminating 1 cigarette a day for a week and then 2 cigarettes a day for a week can be helpful in slowly decreasing the number of cigarettes smoked. I provided  them  with smoking cessation  information  with contact information for community resources, classes, free nicotine replacement therapy, and access to mobile apps, text messaging, and on-line smoking cessation help. I have also provided  them  the office contact information in the event they have any questions. We discussed the time and location of the scan, and that either Karna Curly RN, Karna Doom, RN  or I will call / send a letter with the results within 24-72 hours of receiving them. The patient verbalized understanding of all of  the above and had no further questions upon leaving the office. They have my contact information in the event they have any  further questions.  I spent 3 minutes counseling  on smoking cessation and the health risks of continued tobacco abuse.  I explained to the patient that there has been a high incidence of coronary artery disease noted on these exams. I explained that this is a non-gated exam therefore degree or severity cannot be determined. This patient is on statin therapy. I have asked the patient to follow-up with their PCP regarding any incidental finding of coronary artery disease and management with diet or medication as their PCP  feels is clinically indicated. The patient verbalized understanding of the above and had no further questions upon completion of the visit.    Jim SAUNDERS Marisah Laker, PA-C

## 2023-09-06 NOTE — Patient Instructions (Signed)

## 2023-09-09 ENCOUNTER — Ambulatory Visit (HOSPITAL_COMMUNITY): Payer: 59

## 2023-09-12 ENCOUNTER — Encounter: Payer: Self-pay | Admitting: Family Medicine

## 2023-09-12 ENCOUNTER — Ambulatory Visit: Payer: 59 | Admitting: Family Medicine

## 2023-09-12 DIAGNOSIS — G894 Chronic pain syndrome: Secondary | ICD-10-CM | POA: Diagnosis not present

## 2023-09-12 DIAGNOSIS — I10 Essential (primary) hypertension: Secondary | ICD-10-CM | POA: Diagnosis not present

## 2023-09-12 DIAGNOSIS — Z5181 Encounter for therapeutic drug level monitoring: Secondary | ICD-10-CM | POA: Diagnosis not present

## 2023-09-12 DIAGNOSIS — M47816 Spondylosis without myelopathy or radiculopathy, lumbar region: Secondary | ICD-10-CM | POA: Diagnosis not present

## 2023-09-12 DIAGNOSIS — Z79891 Long term (current) use of opiate analgesic: Secondary | ICD-10-CM | POA: Diagnosis not present

## 2023-09-14 DIAGNOSIS — M47816 Spondylosis without myelopathy or radiculopathy, lumbar region: Secondary | ICD-10-CM | POA: Diagnosis not present

## 2023-09-14 DIAGNOSIS — Z5181 Encounter for therapeutic drug level monitoring: Secondary | ICD-10-CM | POA: Diagnosis not present

## 2023-09-23 ENCOUNTER — Ambulatory Visit: Payer: 59 | Admitting: Family Medicine

## 2023-09-23 ENCOUNTER — Ambulatory Visit (HOSPITAL_COMMUNITY): Payer: 59

## 2023-10-05 ENCOUNTER — Encounter: Payer: Self-pay | Admitting: Physician Assistant

## 2023-10-05 ENCOUNTER — Ambulatory Visit: Admitting: Physician Assistant

## 2023-10-05 VITALS — BP 106/87 | HR 79 | Temp 98.0°F | Ht 69.0 in | Wt 195.0 lb

## 2023-10-05 DIAGNOSIS — E1140 Type 2 diabetes mellitus with diabetic neuropathy, unspecified: Secondary | ICD-10-CM | POA: Diagnosis not present

## 2023-10-05 DIAGNOSIS — J019 Acute sinusitis, unspecified: Secondary | ICD-10-CM | POA: Diagnosis not present

## 2023-10-05 DIAGNOSIS — B9689 Other specified bacterial agents as the cause of diseases classified elsewhere: Secondary | ICD-10-CM | POA: Insufficient documentation

## 2023-10-05 DIAGNOSIS — Z79899 Other long term (current) drug therapy: Secondary | ICD-10-CM | POA: Diagnosis not present

## 2023-10-05 DIAGNOSIS — E1169 Type 2 diabetes mellitus with other specified complication: Secondary | ICD-10-CM

## 2023-10-05 DIAGNOSIS — E785 Hyperlipidemia, unspecified: Secondary | ICD-10-CM | POA: Diagnosis not present

## 2023-10-05 DIAGNOSIS — I10 Essential (primary) hypertension: Secondary | ICD-10-CM

## 2023-10-05 MED ORDER — AMOXICILLIN-POT CLAVULANATE 875-125 MG PO TABS
1.0000 | ORAL_TABLET | Freq: Two times a day (BID) | ORAL | 0 refills | Status: AC
Start: 2023-10-05 — End: 2023-10-12

## 2023-10-05 NOTE — Assessment & Plan Note (Signed)
 Stable. Continue current management.  Patient is on aspirin and statin. Lipid panel today. Microalbumin today.  Continue dietary efforts and physical activity. Routine diabetic retinopathy screening: up-to-date.

## 2023-10-05 NOTE — Assessment & Plan Note (Signed)
 Stable. Continue with current management without changes. Discussed healthy diet and lifestyle.

## 2023-10-05 NOTE — Progress Notes (Signed)
 Established Patient Office Visit  Subjective   Patient ID: Jim [name], male    DOB: [birth year]  Age: 68 y.o. MRN: [id number]  Chief Complaint  Patient presents with   Cough    Sometimes productive cough, fatigue, fevers and weight loss x's 1 month.     Patient presents today for follow up regarding diabetes, hypertension, and hyperlipidemia. He reports daily compliance with medications and denies adverse effects. He states adequate sugar control, however unable to give a measurement range. He denies shortness of breath, chest pain, vision changes, or headache.  In addition, patient is complaining about cough and congestion x 1 month. He reports associated fevers and fatigue. Denies shortness of breath or chest pain. Wife is also sick. He is taking tylenol and Nyquil as needed. He has an upcoming chest CT for lung cancer screening.    Review of Systems  Constitutional:  Positive for fever and malaise/fatigue.  HENT:  Positive for congestion and sinus pain.   Eyes:  Negative for blurred vision and double vision.  Respiratory:  Positive for cough. Negative for shortness of breath and wheezing.   Cardiovascular:  Negative for chest pain and palpitations.  Gastrointestinal:  Positive for diarrhea. Negative for constipation and vomiting.  Neurological:  Negative for headaches.  Psychiatric/Behavioral:  Positive for depression. The patient is not nervous/anxious.       Objective:     BP 106/87   Pulse 79   Temp 98 F (36.7 C)   Ht 5\' 9"  (1.753 m)   Wt 195 lb (88.5 kg)   SpO2 95%   BMI 28.80 kg/m    Physical Exam Constitutional:      Appearance: Normal appearance.  HENT:     Head: Normocephalic.     Nose: Congestion and rhinorrhea present.     Mouth/Throat:     Mouth: Mucous membranes are moist.     Pharynx: Oropharynx is clear. Posterior oropharyngeal erythema present.  Eyes:     Extraocular Movements: Extraocular movements intact.     Conjunctiva/sclera:  Conjunctivae normal.  Cardiovascular:     Rate and Rhythm: Normal rate and regular rhythm.     Heart sounds: No murmur heard.    No gallop.  Pulmonary:     Effort: Pulmonary effort is normal.     Breath sounds: No wheezing, rhonchi or rales.  Musculoskeletal:     Right lower leg: No edema.     Left lower leg: No edema.  Skin:    General: Skin is warm and dry.  Neurological:     General: No focal deficit present.     Mental Status: He is alert and oriented to person, place, and time.  Psychiatric:        Mood and Affect: Mood normal.        Behavior: Behavior normal.     No results found for any visits on 10/05/23.  The 10-year ASCVD risk score (Arnett DK, et al., 2019) is: 29%    Assessment & Plan:   Return in about 3 months (around 01/05/2024).   HTN (hypertension), benign Assessment & Plan: 106/87 Controlled. Continue current medications. No change in management. Discussed DASH diet and dietary sodium restrictions.  Continue dietary efforts and physical activity.    Type 2 diabetes mellitus with diabetic neuropathy, without long-term current use of insulin (HCC) Assessment & Plan: Stable. Continue current management.  Patient is on aspirin and statin. Lipid panel today. Microalbumin today.  Continue dietary efforts and physical activity.  Routine diabetic retinopathy screening: up-to-date.   Hyperlipidemia associated with type 2 diabetes mellitus (HCC) Assessment & Plan: Stable. Continue with current management without changes. Discussed healthy diet and lifestyle.    Acute bacterial rhinosinusitis Assessment & Plan: Presentation was consistent with sinusitis.  No evidence of other bacterial infections including pneumonia, pharyngitis, otitis media, or orbital cellulitis. Discussed that this fits the picture of viral vs bacterial sinusitis and that due to type and duration of symptoms and exam findings, we will treat as bacterial sinusitis.  Antibiotics  prescribed. Advised to continue Tylenol at home. The patient was instructed to return if the worsens in any way, especially if not tolerating fluids, increased sinus pain or swelling, worsening headache, persistent fever, difficulty swallowing or breathing, or as needed. The patient agreed with the plan.    Orders: -     Amoxicillin-Pot Clavulanate; Take 1 tablet by mouth 2 (two) times daily for 7 days.  Dispense: 14 tablet; Refill: 0    Aniel Hubble, PA-C

## 2023-10-05 NOTE — Assessment & Plan Note (Signed)
 Presentation was consistent with sinusitis.  No evidence of other bacterial infections including pneumonia, pharyngitis, otitis media, or orbital cellulitis. Discussed that this fits the picture of viral vs bacterial sinusitis and that due to type and duration of symptoms and exam findings, we will treat as bacterial sinusitis.  Antibiotics prescribed. Advised to continue Tylenol at home. The patient was instructed to return if the worsens in any way, especially if not tolerating fluids, increased sinus pain or swelling, worsening headache, persistent fever, difficulty swallowing or breathing, or as needed. The patient agreed with the plan.

## 2023-10-05 NOTE — Assessment & Plan Note (Signed)
 106/87 Controlled. Continue current medications. No change in management. Discussed DASH diet and dietary sodium restrictions.  Continue dietary efforts and physical activity.

## 2023-10-06 ENCOUNTER — Encounter: Payer: Self-pay | Admitting: Family Medicine

## 2023-10-06 LAB — LIPID PANEL
Chol/HDL Ratio: 5.3 ratio — ABNORMAL HIGH (ref 0.0–5.0)
Cholesterol, Total: 181 mg/dL (ref 100–199)
HDL: 34 mg/dL — ABNORMAL LOW (ref >39)
LDL Chol Calc (NIH): 79 mg/dL (ref 0–99)
Triglycerides: 421 mg/dL — ABNORMAL HIGH (ref 0–149)
VLDL Cholesterol Cal: 68 mg/dL — ABNORMAL HIGH (ref 5–40)

## 2023-10-06 LAB — BASIC METABOLIC PANEL
BUN/Creatinine Ratio: 10 (ref 10–24)
BUN: 10 mg/dL (ref 8–27)
CO2: 23 mmol/L (ref 20–29)
Calcium: 9.1 mg/dL (ref 8.6–10.2)
Chloride: 105 mmol/L (ref 96–106)
Creatinine, Ser: 1.05 mg/dL (ref 0.76–1.27)
Glucose: 121 mg/dL — ABNORMAL HIGH (ref 70–99)
Potassium: 4.9 mmol/L (ref 3.5–5.2)
Sodium: 143 mmol/L (ref 134–144)
eGFR: 77 mL/min/{1.73_m2} (ref >59)

## 2023-10-06 LAB — CBC WITH DIFFERENTIAL/PLATELET
Basophils Absolute: 0.1 10*3/uL (ref 0.0–0.2)
Basos: 1 %
EOS (ABSOLUTE): 0.3 10*3/uL (ref 0.0–0.4)
Eos: 3 %
Hematocrit: 50.7 % (ref 37.5–51.0)
Hemoglobin: 16.9 g/dL (ref 13.0–17.7)
Immature Grans (Abs): 0 10*3/uL (ref 0.0–0.1)
Immature Granulocytes: 0 %
Lymphocytes Absolute: 2.1 10*3/uL (ref 0.7–3.1)
Lymphs: 24 %
MCH: 30.8 pg (ref 26.6–33.0)
MCHC: 33.3 g/dL (ref 31.5–35.7)
MCV: 93 fL (ref 79–97)
Monocytes Absolute: 0.6 10*3/uL (ref 0.1–0.9)
Monocytes: 7 %
Neutrophils Absolute: 5.6 10*3/uL (ref 1.4–7.0)
Neutrophils: 65 %
Platelets: 152 10*3/uL (ref 150–450)
RBC: 5.48 x10E6/uL (ref 4.14–5.80)
RDW: 14.7 % (ref 11.6–15.4)
WBC: 8.5 10*3/uL (ref 3.4–10.8)

## 2023-10-06 LAB — HEPATIC FUNCTION PANEL
ALT: 39 IU/L (ref 0–44)
AST: 42 IU/L — ABNORMAL HIGH (ref 0–40)
Albumin: 4.5 g/dL (ref 3.9–4.9)
Alkaline Phosphatase: 119 IU/L (ref 44–121)
Bilirubin Total: 0.3 mg/dL (ref 0.0–1.2)
Bilirubin, Direct: 0.1 mg/dL (ref 0.00–0.40)
Total Protein: 6.7 g/dL (ref 6.0–8.5)

## 2023-10-06 LAB — HEMOGLOBIN A1C
Est. average glucose Bld gHb Est-mCnc: 146 mg/dL
Hgb A1c MFr Bld: 6.7 % — ABNORMAL HIGH (ref 4.8–5.6)

## 2023-10-06 LAB — MICROALBUMIN / CREATININE URINE RATIO
Creatinine, Urine: 90.6 mg/dL
Microalb/Creat Ratio: 27 mg/g{creat} (ref 0–29)
Microalbumin, Urine: 24.6 ug/mL

## 2023-10-09 ENCOUNTER — Other Ambulatory Visit: Payer: Self-pay | Admitting: Family Medicine

## 2023-10-10 ENCOUNTER — Ambulatory Visit (HOSPITAL_COMMUNITY): Payer: 59

## 2023-10-10 ENCOUNTER — Ambulatory Visit: Payer: 59 | Admitting: Family Medicine

## 2023-10-10 DIAGNOSIS — G894 Chronic pain syndrome: Secondary | ICD-10-CM | POA: Diagnosis not present

## 2023-10-10 DIAGNOSIS — Z79891 Long term (current) use of opiate analgesic: Secondary | ICD-10-CM | POA: Diagnosis not present

## 2023-10-11 ENCOUNTER — Ambulatory Visit (HOSPITAL_COMMUNITY): Admission: RE | Admit: 2023-10-11 | Payer: 59 | Source: Ambulatory Visit

## 2023-10-24 ENCOUNTER — Ambulatory Visit: Payer: Self-pay | Admitting: *Deleted

## 2023-10-24 NOTE — Telephone Encounter (Signed)
 Copied from CRM [id number]. Topic: Clinical - Red Word Triage >> Oct 24, 2023  2:00 PM Nyra Capes wrote: Red Word that prompted transfer to Nurse Triage: patient called in stating having swallowing sometimes, can drink water, sometimes food will go down, this swallowing problem is getting worse. Patient has had throat stretched a couple of years ago. Reason for Disposition  Swallowing difficulty is a chronic symptom (recurrent or ongoing AND present > 4 weeks)  Answer Assessment - Initial Assessment Questions 1. DESCRIPTION: "Tell me more about this problem." "Are you  having trouble swallowing liquids, solids, or both?" "Any trouble with swallowing saliva (spit)?"     I'm having trouble swallowing solids.    I can swallow liquids.  2. SEVERITY: "How bad is the swallowing difficulty?"  (e.g., Scale 1-10; or mild, moderate, severe)   - MILD (0-3): Occasional swallowing difficulty; has trouble swallowing certain types of foods or liquids.   - MODERATE (4-7): Frequent swallowing difficulty; only able to swallow small amounts of foods and fluids.   - SEVERE (8-10): Unable to swallow any foods, fluids, or saliva; sensation of "lump in throat" or "something stuck in throat", and frequent drooling or spitting may be present.     Mild 3. ONSET: "When did the swallowing problems begin?"      A couple of months ago progressively getting worse. 4. CAUSE: "What do you think is causing the problem?"  (e.g., dry mouth, food or pill stuck in throat, mouth pain, sore throat, progression of disease process such as dementia or Parkinson's disease).      My throat is needing to be stretched open again. 5. CHRONIC or RECURRENT: "Is this a new problem for you?"  If No, ask: "How long have you had this problem?" (e.g., days, weeks, months)      Yes it has started over the last couple of months 6. OTHER SYMPTOMS: "Do you have any other symptoms?" (e.g., chest pain, difficulty breathing, mouth sores, sore throat, swollen  tongue, chest pain)     I can breath fine and all 7. PREGNANCY: "Is there any chance you are pregnant?" "When was your last menstrual period?"     N/A  Protocols used: Swallowing Difficulty-A-AH  Chief Complaint: getting harder to swallow solids.   Needs throat stretched open again. Symptoms: Swallows liquids fine.   Breathing is fine. Frequency: For the last couple of months it's getting harder to swallow solids. Pertinent Negatives: Patient denies trouble breathing or swallowing liquids. Disposition: [] ED /[] Urgent Care (no appt availability in office) / [x] Appointment(In office/virtual)/ []  [facility]/ [] Home Care/ [] Refused Recommended Disposition /[]  Mobile Bus/ []  Follow-up with PCP Additional Notes: Appt made with Dr. Gerda Diss for 10/25/2023

## 2023-10-25 ENCOUNTER — Ambulatory Visit: Admitting: Family Medicine

## 2023-10-25 VITALS — BP 110/68 | HR 68 | Temp 98.0°F | Ht 69.0 in | Wt 202.0 lb

## 2023-10-25 DIAGNOSIS — I8510 Secondary esophageal varices without bleeding: Secondary | ICD-10-CM

## 2023-10-25 DIAGNOSIS — E1140 Type 2 diabetes mellitus with diabetic neuropathy, unspecified: Secondary | ICD-10-CM | POA: Diagnosis not present

## 2023-10-25 DIAGNOSIS — E1169 Type 2 diabetes mellitus with other specified complication: Secondary | ICD-10-CM

## 2023-10-25 DIAGNOSIS — E785 Hyperlipidemia, unspecified: Secondary | ICD-10-CM | POA: Diagnosis not present

## 2023-10-25 DIAGNOSIS — K7460 Unspecified cirrhosis of liver: Secondary | ICD-10-CM | POA: Diagnosis not present

## 2023-10-25 DIAGNOSIS — F321 Major depressive disorder, single episode, moderate: Secondary | ICD-10-CM

## 2023-10-25 DIAGNOSIS — R1319 Other dysphagia: Secondary | ICD-10-CM

## 2023-10-25 NOTE — Progress Notes (Signed)
   Subjective:    Patient ID: Jim [name], male    DOB: [birth year], 68 y.o.   MRN: [id number]  HPI Dysphagia -previous throat stretched in past.  Discussed the use of AI scribe software for clinical note transcription with the patient, who gave verbal consent to proceed.  History of Present Illness   SAMEUL [name] is a 68 year old male who presents with difficulty swallowing solids.  He has been experiencing difficulty swallowing solids for the past one to two months, with a sensation of food getting 'stuck' in the upper esophagus, necessitating regurgitation to clear it. Liquids do not cause any issues, but meats and certain other solids are particularly problematic. He has a history of esophageal dilation during a previous hospital stay. No bleeding or blood in vomit is reported, and bowel movements have improved recently. No episodes of low blood sugar have occurred.  He experiences significant leg swelling, particularly in one leg, which he attributes to past surgeries. The swelling feels 'like a rock' and is associated with itching. He uses skin lotion regularly and applies Campopanigone cream when the itching becomes severe, which provides relief.  He experiences depression, characterized by feeling down and a lack of enjoyment in activities he used to like. He finds it difficult to perform daily tasks such as taking out the garbage or showering. He confirms adherence to his medication regimen, using a pillbox to ensure timely intake.        Review of Systems     Objective:   Physical Exam  General-in no acute distress Eyes-no discharge Lungs-respiratory rate normal, CTA CV-no murmurs,RRR Extremities skin warm dry no edema Neuro grossly normal Behavior normal, alert Abdomen soft      Assessment & Plan:  1. Esophageal dysphagia (Primary) Referral for endoscopy Patient also due for colonoscopy Healthy diet recommended Denies any bleeding issues More than likely will  need stretching - Ambulatory referral to Gastroenterology Patient was cautioned to try to minimize foods that could get stuck  2. Depression, major, single episode, moderate (HCC) Patient already on medication.  Trying to cope with his declining health as best as possible referral for counseling - Ambulatory referral to Psychology  3. Secondary esophageal 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) Endoscopy should be able to help evaluate how this is doing for him Results for orders placed or performed in visit on 08/26/23  [facility] DIABETES EYE EXAM   Collection Time: 08/02/23 12:18 PM  Result Value Ref Range   [facility] Diabetic Eye Exam No Retinopathy No Retinopathy   Patient not anemic, LDL at 79, urine micro protein negative, kidney function normal, A1c improved at 6.7  The patient was seen today as part of a comprehensive visit for diabetes. The importance of keeping her A1c at or below 7 range was discussed.  Discussed diet, activity, and medication compliance Emphasized healthy eating primarily with vegetables fruits and if utilizing meats lean meats such as chicken or fish grilled baked broiled Avoid sugary drinks Minimize and avoid processed foods Fit in regular physical activity preferably 25 to 30 minutes 4 times per week Standard follow-up visit recommended.  Patient aware lack of control and follow-up increases risk of diabetic complications. Regular follow-up visits Yearly ophthalmology Yearly foot exam Follow-up within 6 months

## 2023-10-30 ENCOUNTER — Other Ambulatory Visit: Payer: Self-pay | Admitting: Family Medicine

## 2023-10-31 ENCOUNTER — Encounter (INDEPENDENT_AMBULATORY_CARE_PROVIDER_SITE_OTHER): Payer: Self-pay | Admitting: *Deleted

## 2023-11-06 ENCOUNTER — Other Ambulatory Visit: Payer: Self-pay | Admitting: Family Medicine

## 2023-11-07 ENCOUNTER — Other Ambulatory Visit: Payer: Self-pay | Admitting: Family Medicine

## 2023-11-07 ENCOUNTER — Other Ambulatory Visit: Payer: Self-pay

## 2023-11-07 DIAGNOSIS — I10 Essential (primary) hypertension: Secondary | ICD-10-CM

## 2023-11-07 DIAGNOSIS — R350 Frequency of micturition: Secondary | ICD-10-CM

## 2023-11-07 DIAGNOSIS — Z79891 Long term (current) use of opiate analgesic: Secondary | ICD-10-CM | POA: Diagnosis not present

## 2023-11-07 DIAGNOSIS — E1140 Type 2 diabetes mellitus with diabetic neuropathy, unspecified: Secondary | ICD-10-CM

## 2023-11-07 DIAGNOSIS — G894 Chronic pain syndrome: Secondary | ICD-10-CM | POA: Diagnosis not present

## 2023-11-07 MED ORDER — METFORMIN HCL 500 MG PO TABS: ORAL_TABLET | ORAL | 1 refills | Status: DC

## 2023-11-12 ENCOUNTER — Other Ambulatory Visit: Payer: Self-pay | Admitting: Family Medicine

## 2023-11-14 ENCOUNTER — Ambulatory Visit (HOSPITAL_COMMUNITY)

## 2023-11-14 ENCOUNTER — Other Ambulatory Visit: Payer: Self-pay

## 2023-11-14 MED ORDER — PANTOPRAZOLE SODIUM 40 MG PO TBEC: 40.0000 mg | DELAYED_RELEASE_TABLET | Freq: Every day | ORAL | 3 refills | Status: DC

## 2023-11-16 ENCOUNTER — Other Ambulatory Visit: Payer: Self-pay | Admitting: Medical Genetics

## 2023-11-16 ENCOUNTER — Ambulatory Visit (INDEPENDENT_AMBULATORY_CARE_PROVIDER_SITE_OTHER): Admitting: Gastroenterology

## 2023-11-16 DIAGNOSIS — G894 Chronic pain syndrome: Secondary | ICD-10-CM | POA: Diagnosis not present

## 2023-11-18 ENCOUNTER — Other Ambulatory Visit (HOSPITAL_COMMUNITY)
Admission: RE | Admit: 2023-11-18 | Discharge: 2023-11-18 | Disposition: A | Discharge status: Home / Self Care | Payer: Self-pay | Source: Ambulatory Visit | Attending: Medical Genetics | Admitting: Medical Genetics

## 2023-11-21 DIAGNOSIS — H905 Unspecified sensorineural hearing loss: Secondary | ICD-10-CM | POA: Diagnosis not present

## 2023-11-28 LAB — GENECONNECT MOLECULAR SCREEN: Genetic Analysis Overall Interpretation: NEGATIVE

## 2023-11-30 ENCOUNTER — Ambulatory Visit (INDEPENDENT_AMBULATORY_CARE_PROVIDER_SITE_OTHER): Admitting: Gastroenterology

## 2023-11-30 ENCOUNTER — Encounter: Payer: Self-pay | Admitting: Gastroenterology

## 2023-11-30 ENCOUNTER — Telehealth (INDEPENDENT_AMBULATORY_CARE_PROVIDER_SITE_OTHER): Payer: Self-pay | Admitting: Gastroenterology

## 2023-11-30 VITALS — BP 113/75 | HR 69 | Temp 97.8°F | Ht 68.0 in | Wt 196.8 lb

## 2023-11-30 DIAGNOSIS — Z860101 Personal history of adenomatous and serrated colon polyps: Secondary | ICD-10-CM

## 2023-11-30 DIAGNOSIS — Z8719 Personal history of other diseases of the digestive system: Secondary | ICD-10-CM | POA: Diagnosis not present

## 2023-11-30 DIAGNOSIS — K7460 Unspecified cirrhosis of liver: Secondary | ICD-10-CM | POA: Diagnosis not present

## 2023-11-30 DIAGNOSIS — Z860100 Personal history of colon polyps, unspecified: Secondary | ICD-10-CM | POA: Insufficient documentation

## 2023-11-30 DIAGNOSIS — R634 Abnormal weight loss: Secondary | ICD-10-CM | POA: Diagnosis not present

## 2023-11-30 DIAGNOSIS — R1310 Dysphagia, unspecified: Secondary | ICD-10-CM | POA: Diagnosis not present

## 2023-11-30 NOTE — Progress Notes (Signed)
 Gastroenterology Office Note    Referring Provider: Bennet Brasil, MD Primary Care Physician:  Jim Brasil, MD  Primary GI: Dr. Riley [name]    Chief Complaint   Chief Complaint  Patient presents with   Dysphagia    Patient here today due to having issues with swallowing for a few months now. Patient says he has been having food stuck in his throat and he is having to cough it back up. Patient says he is not taking any ppi.     History of Present Illness   Jim [name] is a 68 y.o. male presenting today at the request of Jim [name], Jim Marvel, MD due to dysphagia. He was last seen in Jan 2024 and actually has history of cirhosis felt secondary to [facility], prior negative evaluation in 2024. History of adenomas with last colonoscopy in 2013 and overdue for surveillance. He was lost to follow-up.   Few months ago started having solid food dysphagia, now worsening. Issues with chicken, rice, bread. States everything feels it gets hung. Liquids without any issues. Soft foods without any issues. Notes decreased appetite for past 6 months. No melena or overt GI bleeding. No abdominal pain. No abdominal distension. No constipation or diarrhea issues. Interested in colonoscopy now. No mental status changes or confusion. Notes decreased energy for awhile now. Since lower extremity bypass last year has not had any energy. No issues with GERD symptoms. No PPI.   Today 196, 220 in Aug 2024, typically around low 200s.     EGD 07/08/2022: -Grade 1/2 esophageal varices -Erythematous mucosa in the antrum s/p biopsy (mild chronic gastritis) -Normal duodenum -Advised PPI twice daily -Consider stopping hydrochlorothiazide  and start carvedilol  3.125 mg twice daily   Colonoscopy in 2013 with multiple tubular adenomas.    No FH colon cancer.     Past Medical History:  Diagnosis Date   Arthritis    Asthma    Back pain    Bursitis of hip    Bilateral   Depression    Diabetes mellitus  without complication (HCC)    DJD (degenerative joint disease) of knee    Bilateral Left > Right   Esophageal ulcer    Ex-smoker    Quit 11/07/2011   Gastric ulcer    GERD (gastroesophageal reflux disease)    High cholesterol    History of colon polyps    Hyperlipidemia    Hypertension    Primary localized osteoarthritis of right knee    PVD (peripheral vascular disease) with claudication (HCC) 05/21/2018   Under the care of vascular surgery   Sleep apnea     Past Surgical History:  Procedure Laterality Date   ABDOMINAL AORTOGRAM W/LOWER EXTREMITY N/A 05/19/2018   Procedure: ABDOMINAL AORTOGRAM W/LOWER EXTREMITY;  Surgeon: Jim Char, MD;  Location: [facility] INVASIVE CV LAB;  Service: Cardiovascular;  Laterality: N/A;   ABDOMINAL AORTOGRAM W/LOWER EXTREMITY Right 12/08/2022   Procedure: ABDOMINAL AORTOGRAM W/LOWER EXTREMITY;  Surgeon: Jim Part, MD;  Location: [facility] INVASIVE CV LAB;  Service: Cardiovascular;  Laterality: Right;   BIOPSY  07/08/2022   Procedure: BIOPSY;  Surgeon: Jim Garden, MD;  Location: [facility] ENDO SU[facility];  Service: Gastroenterology;;   CARPAL TUNNEL RELEASE     right   CARPAL TUNNEL RELEASE Left 05/16/13   Dr Jim [name]   COLONOSCOPY  01/31/2012   Procedure: COLONOSCOPY;  Surgeon: Jim Espy, MD;  Location: [facility];  Service: Endoscopy;  Laterality: N/A;  7:30  AM   ESOPHAGEAL DILATION N/A 07/08/2022   Procedure: ESOPHAGEAL DILATION;  Surgeon: Jim Garden, MD;  Location: [facility];  Service: Gastroenterology;  Laterality: N/A;   ESOPHAGOGASTRODUODENOSCOPY (EGD) WITH PROPOFOL  N/A 07/08/2022   Procedure: ESOPHAGOGASTRODUODENOSCOPY (EGD) WITH PROPOFOL ;  Surgeon: Jim Garden, MD;  Location: [facility];  Service: Gastroenterology;  Laterality: N/A;   FEMORAL-TIBIAL BYPASS GRAFT Right 12/13/2022   Procedure: RIGHT COMMON FEMORAL-POSTERIOR TIBIAL ARTERY BYPASS WITH VEIN;  Surgeon: Jim Part, MD;  Location: [facility] OR;   Service: Vascular;  Laterality: Right;   FINGER AMPUTATION     partial left 5th finger   JOINT REPLACEMENT     KNEE ARTHROSCOPY     right knee   ORIF FINGER FRACTURE     Right 5th finger, Amputation reatachment   PERIPHERAL VASCULAR INTERVENTION Right 05/19/2018   Procedure: PERIPHERAL VASCULAR INTERVENTION;  Surgeon: Jim Char, MD;  Location: [facility] INV[facility] CV LAB;  Service: Cardiovascular;  Laterality: Right;  right popliteal   TONSILLECTOMY     TOTAL KNEE ARTHROPLASTY  09/04/2012   Procedure: TOTAL KNEE ARTHROPLASTY;  Surgeon: Jim Kerns, MD;  Location: [facility] OR;  Service: Orthopedics;  Laterality: Left;   TOTAL KNEE ARTHROPLASTY Right 07/21/2015   Procedure: RIGHT TOTAL KNEE ARTHROPLASTY;  Surgeon: Jim Habermann, MD;  Location: [facility] OR;  Service: Orthopedics;  Laterality: Right;    Current Outpatient Medications  Medication Sig Dispense Refill   acetaminophen  (TYLENOL ) 500 MG tablet Take 500-1,000 mg by mouth every 6 (six) hours as needed (pain.).     albuterol  (VENTOLIN  HFA) 108 (90 Base) MCG/ACT inhaler Inhale 2 puffs into the lungs every 6 (six) hours as needed for wheezing. 1 each 6   aspirin  EC 81 MG tablet Take 1 tablet (81 mg total) by mouth daily. Swallow whole. 150 tablet 2   blood glucose meter kit and supplies Dispense based on patient and insurance preference. Use to check glucose once daily as directed. (FOR ICD-10 E11.9). 1 each 0   buPROPion  (WELLBUTRIN  XL) 150 MG 24 hr tablet TAKE 1 TABLET BY MOUTH EVERY DAY 90 tablet 1   clopidogrel  (PLAVIX ) 75 MG tablet TAKE 1 TABLET BY MOUTH EVERY DAY 90 tablet 1   ezetimibe  (ZETIA ) 10 MG tablet TAKE 1 TABLET BY MOUTH EVERY DAY 90 tablet 1   FLUoxetine  (PROZAC ) 20 MG capsule TAKE 3 CAPSULES BY MOUTH EVERY DAY 270 capsule 1   gabapentin  (NEURONTIN ) 400 MG capsule Take 400 mg by mouth at bedtime.     glipiZIDE  (GLUCOTROL  XL) 2.5 MG 24 hr tablet TAKE 1 TABLET BY MOUTH EVERY DAY IN THE MORNING 90 tablet 1   glucose blood (ONETOUCH  VERIO) test strip USE AS INSTRUCTED 50 strip 12   JARDIANCE  10 MG TABS tablet TAKE 1 TABLET BY MOUTH DAILY BEFORE BREAKFAST. 90 tablet 1   Lancets (ONETOUCH ULTRASOFT) lancets      metFORMIN  (GLUCOPHAGE ) 500 MG tablet TAKE 2 TABLET BY MOUTH EVERY MORNING AND EVENING 360 tablet 1   naloxone  (NARCAN ) nasal spray 4 mg/0.1 mL Use as directed 1 each 0   Oxycodone  HCl 20 MG TABS Take 20 mg by mouth every 4 (four) hours as needed (pain.). Take 1 tablet by mouth 4-5 times daily if tolerated     rosuvastatin  (CRESTOR ) 5 MG tablet TAKE 1 TABLET BY MOUTH EVERYDAY AT BEDTIME 90 tablet 2   traZODone  (DESYREL ) 50 MG tablet TAKE 1 TABLET BY MOUTH EVERY DAY AT BEDTIME AS NEEDED FOR INSOMNIA 90  tablet 1   pantoprazole  (PROTONIX ) 40 MG tablet Take 1 tablet (40 mg total) by mouth daily. For acid reflux (Patient not taking: Reported on 11/30/2023) 90 tablet 3   No current facility-administered medications for this visit.    Allergies as of 11/30/2023 - Review Complete 11/30/2023  Allergen Reaction Noted   Doxycycline  Nausea Only 10/23/2012   Pravastatin Other (See Comments) 10/23/2012    Family History  Problem Relation Age of Onset   Heart disease Mother    Heart disease Father    Diabetes type I Father    Hypertension Father    Cancer Father    Diabetes Father    Heart disease Sister    Heart disease Brother     Social History   Socioeconomic History   Marital status: Married    Spouse name: Not on file   Number of children: Not on file   Years of education: Not on file   Highest education level: 12th grade  Occupational History   Occupation: disabled    Employer: OTHER  Tobacco Use   Smoking status: Every Day    Current packs/day: 1.00    Average packs/day: 1.2 packs/day for 70.0 years (85.0 ttl pk-yrs)    Types: Cigarettes    Passive exposure: Current   Smokeless tobacco: Never  Vaping Use   Vaping status: Never Used  Substance and Sexual Activity   Alcohol  use: Not Currently    Drug use: No   Sexual activity: Not Currently  Other Topics Concern   Not on file  Social History Narrative   Not on file   Social Drivers of Health   Financial Resource Strain: Medium Risk (08/30/2023)   Overall Financial Resource Strain (CARDIA)    Difficulty of Paying Living Expenses: Somewhat hard  Food Insecurity: Food Insecurity Present (08/30/2023)   Hunger Vital Sign    Worried About Running Out of Food in the Last Year: Sometimes true    Ran Out of Food in the Last Year: Sometimes true  Transportation Needs: No Transportation Needs (08/30/2023)   PRAPARE - Administrator, Civil Service (Medical): No    Lack of Transportation (Non-Medical): No  Physical Activity: Inactive (08/30/2023)   Exercise Vital Sign    Days of Exercise per Week: 2 days    Minutes of Exercise per Session: 0 min  Stress: Stress Concern Present (08/30/2023)   Jim [name] of Occupational Health - Occupational Stress Questionnaire    Feeling of Stress : To some extent  Social Connections: Moderately Integrated (08/30/2023)   Social Connection and Isolation Panel [NHANES]    Frequency of Communication with Friends and Family: More than three times a week    Frequency of Social Gatherings with Friends and Family: Once a week    Attends Religious Services: 1 to 4 times per year    Active Member of [employer] or Organizations: No    Attends Banker Meetings: Patient declined    Marital Status: Married  Catering manager Violence: Not At Risk (06/24/2023)   Humiliation, Afraid, Rape, and Kick questionnaire    Fear of Current or Ex-Partner: No    Emotionally Abused: No    Physically Abused: No    Sexually Abused: No     Review of Systems   Gen: see HPI CV: Denies chest pain, heart palpitations, peripheral edema, syncope.  Resp: Denies shortness of breath at rest or with exertion. Denies wheezing or cough.  GI: see HPI GU : Denies urinary  burning, urinary frequency, urinary  hesitancy MS: Denies joint pain, muscle weakness, cramps, or limitation of movement.  Derm: Denies rash, itching, dry skin Psych: Denies depression, anxiety, memory loss, and confusion Heme: Denies bruising, bleeding, and enlarged lymph nodes.   Physical Exam   BP 113/75 (BP Location: Left Arm, Patient Position: Sitting, Cuff Size: Normal)   Pulse 69   Temp 97.8 F (36.6 C) (Temporal)   Ht 5\' 8"  (1.727 m)   Wt 196 lb 12.8 oz (89.3 kg)   BMI 29.92 kg/m  General:   Alert and oriented. Pleasant and cooperative. Well-nourished and well-developed.  Head:  Normocephalic and atraumatic. Eyes:  Without icterus Ears:  Normal auditory acuity. Lungs:  Clear to auscultation bilaterally.  Heart:  S1, S2 present without murmurs appreciated.  Abdomen:  +BS, soft, non-tender and non-distended. Liver margin enlarged and extending across to LUQ Rectal:  Deferred  Msk:  Symmetrical without gross deformities. Normal posture. Extremities:  Without edema. Neurologic:  Alert and  oriented x4;  grossly normal neurologically. Skin:  Intact without significant lesions or rashes. Psych:  Alert and cooperative. Normal mood and affect.   Assessment   Jim [name] is a 68 y.o. male presenting today at the request of  Dr. Fairy Homer due to dysphagia. History is also notable for MASH cirrhosis and history of adenomas, last seen in Jan 2024 and lost to follow-up.  Dysphagia: to solid foods. Associated weight loss noted from his baseline of low to mid 220s down to 196 today unintentionally. Last EGD Dec 2023 with Grade 1/2 esophageal varices, normal duodenum. No PPI currently. Could have dysphagia due to GERD but denies any symptoms. Will hold off on PPI until s/p EGD.   Adenomas: multiple in 2013 and overdue for surveillance. No overt GI bleeding. He would like to pursue this as well.  Cirrhosis: felt secondary to [facility] Grade 1/2 varices in 2023. Lost to follow-up. Will need updated US , labs, and EGD upcoming  for dilation can also serve for variceal surveillance. May benefit from non-selective beta blocker.   Weight loss : noted from his baseline of low to mid 220s down to 196 today unintentionally. No abdominal pain. Will need close follow-up in clinic following procedures.   Patient will remain on Plavix .    PLAN   Proceed with colonoscopy/EGD/dilation by Dr. Riley [name] in near future: the risks, benefits, and alternatives have been discussed with the patient in detail. The patient states understanding and desires to proceed.  Remain on Plavix   US  abdomen complete, MELD labs, AFP  Return in 3 months for close follow-up and further cirrhosis care    Delman Ferns, PhD, ANP-BC [facility]

## 2023-11-30 NOTE — Patient Instructions (Signed)
 We are arranging a colonoscopy, upper endoscopy, and dilation in the near future.  You will need to stop Jardiance  72 hours prior and no oral diabetes medications the morning of the procedure.  We are arranging an ultrasound of your liver and labs as well. We will see you in 3 months!  It was a pleasure to see you today. I want to create trusting relationships with patients and provide genuine, compassionate, and quality care. I truly value your feedback, so please be on the lookout for a survey regarding your visit with me today. I appreciate your time in completing this!         Delman Ferns, PhD, ANP-BC [facility]

## 2023-11-30 NOTE — Telephone Encounter (Signed)
    11/30/23  Jim [name] 02-Sep-1955  What type of surgery is being performed? Colonoscopy/EGD +/-ED  When is surgery scheduled? TBD  Are there any medications that need to be held prior to surgery and how long? Clearance to hold Plavix  x5 days prior  Name of physician performing surgery?  Dr. Loa Riling [facility] at [facility]  Anethesia type (none, local, MAC, general)? Choice

## 2023-11-30 NOTE — Telephone Encounter (Signed)
 Hi Tanya I am fine with him holding his Plavix  5 days prior to procedure Typically if all goes well gastroenterology can restart the Plavix  after the procedure The likelihood of peripheral vascular occlusion while off of the Plavix  for this procedure is low but it is still a possibility obviously if he runs into any vascular issues he would need to seek attention.  Otherwise it is standard to come off of this for 5 days prior It is important for your office to let the patient know about this And if the patient consents at that point and then he can be instructed to stop the Plavix  5 days prior If any questions let us  know thank you-Dr. Charlotta Cook

## 2023-12-01 LAB — COMPREHENSIVE METABOLIC PANEL WITH GFR
ALT: 39 IU/L (ref 0–44)
AST: 31 IU/L (ref 0–40)
Albumin: 4.7 g/dL (ref 3.9–4.9)
Alkaline Phosphatase: 107 IU/L (ref 44–121)
BUN/Creatinine Ratio: 18 (ref 10–24)
BUN: 18 mg/dL (ref 8–27)
Bilirubin Total: 0.7 mg/dL (ref 0.0–1.2)
CO2: 18 mmol/L — ABNORMAL LOW (ref 20–29)
Calcium: 9.1 mg/dL (ref 8.6–10.2)
Chloride: 108 mmol/L — ABNORMAL HIGH (ref 96–106)
Creatinine, Ser: 1.01 mg/dL (ref 0.76–1.27)
Globulin, Total: 2.1 g/dL (ref 1.5–4.5)
Glucose: 113 mg/dL — ABNORMAL HIGH (ref 70–99)
Potassium: 4.1 mmol/L (ref 3.5–5.2)
Sodium: 142 mmol/L (ref 134–144)
Total Protein: 6.8 g/dL (ref 6.0–8.5)
eGFR: 81 mL/min/{1.73_m2} (ref >59)

## 2023-12-01 LAB — PROTIME-INR
INR: 1 (ref 0.9–1.2)
Prothrombin Time: 11.4 s (ref 9.1–12.0)

## 2023-12-01 LAB — AFP TUMOR MARKER: AFP, Serum, Tumor Marker: 5.7 ng/mL (ref 0.0–8.4)

## 2023-12-01 NOTE — Telephone Encounter (Signed)
 noted

## 2023-12-01 NOTE — Telephone Encounter (Signed)
 Dr.Rourk has no openings that work with pt. Pt OK scheduling in June.

## 2023-12-05 DIAGNOSIS — Z79891 Long term (current) use of opiate analgesic: Secondary | ICD-10-CM | POA: Diagnosis not present

## 2023-12-05 DIAGNOSIS — G894 Chronic pain syndrome: Secondary | ICD-10-CM | POA: Diagnosis not present

## 2023-12-07 ENCOUNTER — Encounter: Payer: Self-pay | Admitting: *Deleted

## 2023-12-07 ENCOUNTER — Other Ambulatory Visit: Payer: Self-pay | Admitting: *Deleted

## 2023-12-07 MED ORDER — PEG 3350-KCL-NA BICARB-NACL 420 G PO SOLR: 4000.0000 mL | Freq: Once | ORAL | 0 refills | Status: AC

## 2023-12-14 ENCOUNTER — Ambulatory Visit (HOSPITAL_COMMUNITY)

## 2023-12-20 ENCOUNTER — Ambulatory Visit (HOSPITAL_COMMUNITY)
Admission: RE | Admit: 2023-12-20 | Discharge: 2023-12-20 | Disposition: A | Discharge status: Home / Self Care | Source: Ambulatory Visit | Attending: Gastroenterology | Admitting: Gastroenterology

## 2023-12-20 DIAGNOSIS — R161 Splenomegaly, not elsewhere classified: Secondary | ICD-10-CM | POA: Diagnosis not present

## 2023-12-20 DIAGNOSIS — K7460 Unspecified cirrhosis of liver: Secondary | ICD-10-CM | POA: Diagnosis not present

## 2023-12-20 DIAGNOSIS — K8020 Calculus of gallbladder without cholecystitis without obstruction: Secondary | ICD-10-CM | POA: Diagnosis not present

## 2023-12-20 DIAGNOSIS — Z860100 Personal history of colon polyps, unspecified: Secondary | ICD-10-CM | POA: Insufficient documentation

## 2023-12-28 DIAGNOSIS — L309 Dermatitis, unspecified: Secondary | ICD-10-CM | POA: Diagnosis not present

## 2024-01-02 ENCOUNTER — Ambulatory Visit: Payer: Self-pay | Admitting: Gastroenterology

## 2024-01-02 ENCOUNTER — Ambulatory Visit (HOSPITAL_COMMUNITY)

## 2024-01-02 ENCOUNTER — Encounter (HOSPITAL_COMMUNITY): Payer: Self-pay

## 2024-01-04 ENCOUNTER — Encounter (HOSPITAL_COMMUNITY): Payer: Self-pay

## 2024-01-04 ENCOUNTER — Other Ambulatory Visit: Payer: Self-pay

## 2024-01-04 ENCOUNTER — Encounter (HOSPITAL_COMMUNITY)
Admission: RE | Admit: 2024-01-04 | Discharge: 2024-01-04 | Disposition: A | Discharge status: Home / Self Care | Source: Ambulatory Visit | Attending: Internal Medicine | Admitting: Internal Medicine

## 2024-01-05 ENCOUNTER — Encounter (HOSPITAL_COMMUNITY): Payer: Self-pay | Admitting: Certified Registered Nurse Anesthetist

## 2024-01-06 ENCOUNTER — Ambulatory Visit (HOSPITAL_COMMUNITY): Admission: RE | Admit: 2024-01-06 | Source: Home / Self Care | Admitting: Internal Medicine

## 2024-01-06 ENCOUNTER — Encounter (HOSPITAL_COMMUNITY): Admission: RE | Payer: Self-pay | Source: Home / Self Care

## 2024-01-06 SURGERY — COLONOSCOPY
Anesthesia: Choice

## 2024-01-06 NOTE — Telephone Encounter (Signed)
 Pt had to cancel his procedure today due to his wife being very sick and he is his only transportation. Pt states he will call back to reschedule once his wife is feeling better.

## 2024-01-09 DIAGNOSIS — Z79891 Long term (current) use of opiate analgesic: Secondary | ICD-10-CM | POA: Diagnosis not present

## 2024-01-09 DIAGNOSIS — G894 Chronic pain syndrome: Secondary | ICD-10-CM | POA: Diagnosis not present

## 2024-01-23 ENCOUNTER — Encounter: Payer: Self-pay | Admitting: Gastroenterology

## 2024-01-26 ENCOUNTER — Ambulatory Visit: Payer: Self-pay

## 2024-01-26 ENCOUNTER — Encounter: Payer: Self-pay | Admitting: Family Medicine

## 2024-01-26 ENCOUNTER — Ambulatory Visit: Admitting: Family Medicine

## 2024-01-26 ENCOUNTER — Ambulatory Visit (HOSPITAL_COMMUNITY)
Admission: RE | Admit: 2024-01-26 | Discharge: 2024-01-26 | Disposition: A | Discharge status: Home / Self Care | Source: Ambulatory Visit | Attending: Family Medicine | Admitting: Family Medicine

## 2024-01-26 VITALS — BP 120/74 | HR 74 | Temp 98.3°F | Ht 68.0 in

## 2024-01-26 DIAGNOSIS — M25521 Pain in right elbow: Secondary | ICD-10-CM | POA: Insufficient documentation

## 2024-01-26 DIAGNOSIS — W19XXXA Unspecified fall, initial encounter: Secondary | ICD-10-CM | POA: Diagnosis not present

## 2024-01-26 DIAGNOSIS — M25512 Pain in left shoulder: Secondary | ICD-10-CM | POA: Diagnosis not present

## 2024-01-26 DIAGNOSIS — M25511 Pain in right shoulder: Secondary | ICD-10-CM

## 2024-01-26 DIAGNOSIS — M778 Other enthesopathies, not elsewhere classified: Secondary | ICD-10-CM | POA: Diagnosis not present

## 2024-01-26 DIAGNOSIS — M19021 Primary osteoarthritis, right elbow: Secondary | ICD-10-CM | POA: Diagnosis not present

## 2024-01-26 DIAGNOSIS — M19011 Primary osteoarthritis, right shoulder: Secondary | ICD-10-CM | POA: Diagnosis not present

## 2024-01-26 MED ORDER — BACLOFEN 10 MG PO TABS: 5.0000 mg | ORAL_TABLET | Freq: Three times a day (TID) | ORAL | 0 refills | Status: AC | PRN

## 2024-01-26 NOTE — Patient Instructions (Signed)
 Xrays at the hospital.  Contact your pain management practice regarding your pain.  Take care  Dr. Bluford

## 2024-01-26 NOTE — Telephone Encounter (Signed)
 FYI Only or Action Required?: FYI only for provider.  Patient was last seen in primary care on 10/25/2023 by Alphonsa Glendia LABOR, MD. Called Nurse Triage reporting Fall. Symptoms began yesterday. Interventions attempted: Prescription medications: oxycodone . Symptoms are: unchanged.  Triage Disposition: See HCP Within 4 Hours (Or PCP Triage)  Patient/caregiver understands and will follow disposition?: Yes  Appt today with Dr. Bluford 1120.   Copied from CRM [id number]. Topic: Clinical - Red Word Triage >> Jan 26, 2024  8:42 AM Powell HERO wrote: Red Word that prompted transfer to Nurse Triage: Patient states he fell off his porch yesterday and is in severe pain. States the pain is all over, the worst part is between his shoulder blades onto the right side of his neck, his collarbone on the left side. Reason for Disposition  [1] MODERATE weakness (i.e., interferes with work, school, normal activities) AND [2] new-onset or worsening  Answer Assessment - Initial Assessment Questions 1. MECHANISM: How did the fall happen?     Tripped over bag of trash and fell off porch  3. ONSET: When did the fall happen? (e.g., minutes, hours, or days ago)     yesterday 4. LOCATION: What part of the body hit the ground? (e.g., back, buttocks, head, hips, knees, hands, head, stomach)     Fell on R back side 5. INJURY: Did you hurt (injure) yourself when you fell? If Yes, ask: What did you injure? Tell me more about this? (e.g., body area; type of injury; pain severity)     Shoulder blades, collarbone, L knee, arms 6. PAIN: Is there any pain? If Yes, ask: How bad is the pain? (e.g., Scale 1-10; or mild,  moderate, severe)   - NONE (0): No pain   - MILD (1-3): Doesn't interfere with normal activities    - MODERATE (4-7): Interferes with normal activities or awakens from sleep    - SEVERE (8-10): Excruciating pain, unable to do any normal activities      severe 9. OTHER SYMPTOMS: Do you have any other  symptoms? (e.g., dizziness, fever, weakness; new onset or worsening).      No  Pt goes to pain clinic but prescribed pain meds aint helping with pain.  Protocols used: Falls and [facility]

## 2024-01-26 NOTE — Telephone Encounter (Signed)
 Noted

## 2024-01-27 ENCOUNTER — Ambulatory Visit: Payer: Self-pay | Admitting: Family Medicine

## 2024-01-28 ENCOUNTER — Other Ambulatory Visit: Payer: Self-pay | Admitting: Cardiology

## 2024-01-28 ENCOUNTER — Other Ambulatory Visit: Payer: Self-pay | Admitting: Family Medicine

## 2024-01-28 DIAGNOSIS — E782 Mixed hyperlipidemia: Secondary | ICD-10-CM

## 2024-01-28 DIAGNOSIS — I10 Essential (primary) hypertension: Secondary | ICD-10-CM

## 2024-01-28 DIAGNOSIS — E1140 Type 2 diabetes mellitus with diabetic neuropathy, unspecified: Secondary | ICD-10-CM

## 2024-01-29 DIAGNOSIS — W19XXXA Unspecified fall, initial encounter: Secondary | ICD-10-CM | POA: Insufficient documentation

## 2024-01-29 NOTE — Progress Notes (Signed)
 Subjective:  Patient ID: Jim [name], male    DOB: 03-11-1956  Age: 68 y.o. MRN: [id number]  CC:   Chief Complaint  Patient presents with   Felton Rasmussen off of porch approx 3-4 feet down. Rolled when falling landing mostly on right shoulder, hit right knee and head.  No LOC.  Pain in ribs, shoulder and clavicle area.     HPI:  68 year old male presents for evaluation of a fall.  Had a fall on Tuesday. States that he slipped and fell off his porch. Patient denies head injury to me.  Has known chronic pain and is on chronic opioids.  Reports that he hurts all over. Localized most of the pain from the fall to the right shoulder, left clavicle, right side of the chest and right elbow.   Patient Active Problem List   Diagnosis Date Noted   Fall 01/29/2024   History of colon polyps 11/30/2023   Gastroesophageal reflux disease without esophagitis 04/28/2023   Critical limb ischemia of right lower extremity (HCC) 12/13/2022   Secondary esophageal 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) 08/06/2022   Thrombocytopenia (HCC) 08/06/2022   Odynophagia 07/08/2022   Hepatic cirrhosis (HCC) 07/08/2022   Spinal stenosis of lumbar region with neurogenic claudication 12/10/2020   Hyperlipidemia associated with type 2 diabetes mellitus (HCC) 10/21/2020   Arthropathy of lumbar facet joint 06/23/2020   PVD (peripheral vascular disease) with claudication (HCC) 05/21/2018   Type 2 diabetes mellitus with diabetic neuropathy, unspecified (HCC) 01/11/2018   Major depression in partial remission (HCC) 07/07/2016   Insomnia 04/13/2016   Primary localized osteoarthritis of right knee    Chronic pain syndrome 12/10/2014   HTN (hypertension), benign 08/15/2013   Gastric ulcer    Arthritis    DJD (degenerative joint disease) of knee    Ex-smoker     Social Hx   Social History   Socioeconomic History   Marital status: Married    Spouse name: Not on file   Number of children: Not on file   Years of  education: Not on file   Highest education level: 12th grade  Occupational History   Occupation: disabled    Employer: OTHER  Tobacco Use   Smoking status: Every Day    Current packs/day: 1.00    Average packs/day: 1.2 packs/day for 70.0 years (85.0 ttl pk-yrs)    Types: Cigarettes    Passive exposure: Current   Smokeless tobacco: Never  Vaping Use   Vaping status: Never Used  Substance and Sexual Activity   Alcohol  use: Not Currently    Comment: occasionally   Drug use: No   Sexual activity: Not Currently  Other Topics Concern   Not on file  Social History Narrative   Not on file   Social Drivers of Health   Financial Resource Strain: Medium Risk (08/30/2023)   Overall Financial Resource Strain (CARDIA)    Difficulty of Paying Living Expenses: Somewhat hard  Food Insecurity: Food Insecurity Present (08/30/2023)   Hunger Vital Sign    Worried About Running Out of Food in the Last Year: Sometimes true    Ran Out of Food in the Last Year: Sometimes true  Transportation Needs: No Transportation Needs (08/30/2023)   PRAPARE - Administrator, Civil Service (Medical): No    Lack of Transportation (Non-Medical): No  Physical Activity: Inactive (08/30/2023)   Exercise Vital Sign    Days of Exercise per Week: 2 days    Minutes  of Exercise per Session: 0 min  Stress: Stress Concern Present (08/30/2023)   [employer] of Occupational Health - Occupational Stress Questionnaire    Feeling of Stress : To some extent  Social Connections: Moderately Integrated (08/30/2023)   Social Connection and Isolation Panel    Frequency of Communication with Friends and Family: More than three times a week    Frequency of Social Gatherings with Friends and Family: Once a week    Attends Religious Services: 1 to 4 times per year    Active Member of [employer] or Organizations: No    Attends Engineer, structural: Patient declined    Marital Status: Married    Review of  Systems Per HPI  Objective:  BP 120/74   Pulse 74   Temp 98.3 F (36.8 C)   Ht 5' 8 (1.727 m)   SpO2 94%   BMI 29.92 kg/m      01/26/2024   11:07 AM 11/30/2023   10:05 AM 10/25/2023   10:32 AM  BP/Weight  Systolic BP 120 113 110  Diastolic BP 74 75 68  Wt. (Lbs)  196.8 202  BMI  29.92 kg/m2 29.83 kg/m2    Physical Exam Vitals and nursing note reviewed.  Constitutional:      Comments: Chronically ill appearing male in no acute distress.  HENT:     Head: Normocephalic and atraumatic.   Eyes:     General:        Right eye: No discharge.        Left eye: No discharge.     Conjunctiva/sclera: Conjunctivae normal.   Pulmonary:     Effort: Pulmonary effort is normal. No respiratory distress.   Musculoskeletal:     Comments: Tenderness over the left clavicle. No crepitus. Tenderness of the right shoulder.   Neurological:     General: No focal deficit present.     Mental Status: He is alert.     Lab Results  Component Value Date   WBC 8.5 10/05/2023   HGB 16.9 10/05/2023   HCT 50.7 10/05/2023   PLT 152 10/05/2023   GLUCOSE 113 (H) 11/30/2023   CHOL 181 10/05/2023   TRIG 421 (H) 10/05/2023   HDL 34 (L) 10/05/2023   LDLCALC 79 10/05/2023   ALT 39 11/30/2023   AST 31 11/30/2023   NA 142 11/30/2023   K 4.1 11/30/2023   CL 108 (H) 11/30/2023   CREATININE 1.01 11/30/2023   BUN 18 11/30/2023   CO2 18 (L) 11/30/2023   TSH 2.040 11/28/2019   PSA 0.70 08/15/2013   INR 1.0 11/30/2023   HGBA1C 6.7 (H) 10/05/2023   MICROALBUR 1.04 08/15/2013     Assessment & Plan:  Fall, initial encounter Assessment & Plan: Xrays obtained today. Questionable right 3rd rib fracture. No other acute findings. Continue home pain medication. Rx sent for baclofen .  Orders: -     DG Clavicle Left -     DG Shoulder Right -     DG ELBOW COMPLETE RIGHT (3+VIEW)  Other orders -     Baclofen ; Take 0.5-1 tablets (5-10 mg total) by mouth 3 (three) times daily as needed.   Dispense: 30 each; Refill: 0    Follow-up:  Return if symptoms worsen or fail to improve.  Jacqulyn Ahle DO [facility]

## 2024-01-29 NOTE — Assessment & Plan Note (Signed)
 Xrays obtained today. Questionable right 3rd rib fracture. No other acute findings. Continue home pain medication. Rx sent for baclofen .

## 2024-02-06 DIAGNOSIS — G894 Chronic pain syndrome: Secondary | ICD-10-CM | POA: Diagnosis not present

## 2024-02-06 DIAGNOSIS — Z79891 Long term (current) use of opiate analgesic: Secondary | ICD-10-CM | POA: Diagnosis not present

## 2024-03-05 ENCOUNTER — Ambulatory Visit: Admitting: Family Medicine

## 2024-03-05 DIAGNOSIS — S2249XA Multiple fractures of ribs, unspecified side, initial encounter for closed fracture: Secondary | ICD-10-CM | POA: Diagnosis not present

## 2024-03-05 DIAGNOSIS — G894 Chronic pain syndrome: Secondary | ICD-10-CM | POA: Diagnosis not present

## 2024-03-07 ENCOUNTER — Other Ambulatory Visit: Payer: Self-pay | Admitting: Family Medicine

## 2024-03-07 ENCOUNTER — Other Ambulatory Visit: Payer: Self-pay | Admitting: Cardiology

## 2024-03-07 ENCOUNTER — Telehealth: Payer: Self-pay | Admitting: Family Medicine

## 2024-03-07 DIAGNOSIS — E1140 Type 2 diabetes mellitus with diabetic neuropathy, unspecified: Secondary | ICD-10-CM

## 2024-03-07 DIAGNOSIS — E782 Mixed hyperlipidemia: Secondary | ICD-10-CM

## 2024-03-07 DIAGNOSIS — I10 Essential (primary) hypertension: Secondary | ICD-10-CM

## 2024-03-07 DIAGNOSIS — R350 Frequency of micturition: Secondary | ICD-10-CM

## 2024-03-07 NOTE — Telephone Encounter (Signed)
 Nurses Please order lipid, liver, metabolic 7, A1c, PSA  Screening prostate cancer, hyperlipidemia, high risk men, diabetes Please inform the patient to do this lab work several days before his follow-up visit later in August Also please see refills he had request for refills He was also requesting tamsulosin  but according to the electronics this was removed from the med list by different provider Please try to find out is he taking tamsulosin  or not so would know whether or not to renew that medicine otherwise we will see him later in August thank you

## 2024-03-07 NOTE — Telephone Encounter (Signed)
 They have 6 months refills on all the medicines except for tamsulosin  Please try to talk with the patient to find out is he still taking tamsulosin ?  According to the electronic system was removed by different provider thank you

## 2024-03-08 ENCOUNTER — Other Ambulatory Visit: Payer: Self-pay

## 2024-03-08 DIAGNOSIS — E1169 Type 2 diabetes mellitus with other specified complication: Secondary | ICD-10-CM

## 2024-03-08 DIAGNOSIS — Z125 Encounter for screening for malignant neoplasm of prostate: Secondary | ICD-10-CM

## 2024-03-08 DIAGNOSIS — E1140 Type 2 diabetes mellitus with diabetic neuropathy, unspecified: Secondary | ICD-10-CM

## 2024-03-08 DIAGNOSIS — Z79899 Other long term (current) drug therapy: Secondary | ICD-10-CM

## 2024-03-08 DIAGNOSIS — I10 Essential (primary) hypertension: Secondary | ICD-10-CM

## 2024-03-08 NOTE — Telephone Encounter (Signed)
 Please also notify his pharmacy that he is no longer on tamsulosin  so that way they will stop sending us  request thank you

## 2024-03-09 NOTE — Telephone Encounter (Signed)
 Spoke with patient and he states he has not been taking tamsulosin  and its not on his med list at home.

## 2024-03-15 DIAGNOSIS — L720 Epidermal cyst: Secondary | ICD-10-CM | POA: Diagnosis not present

## 2024-03-15 DIAGNOSIS — D485 Neoplasm of uncertain behavior of skin: Secondary | ICD-10-CM | POA: Diagnosis not present

## 2024-03-26 ENCOUNTER — Ambulatory Visit: Admitting: Family Medicine

## 2024-03-26 ENCOUNTER — Ambulatory Visit (INDEPENDENT_AMBULATORY_CARE_PROVIDER_SITE_OTHER): Admitting: Family Medicine

## 2024-03-26 VITALS — BP 106/64 | HR 79 | Temp 97.5°F | Ht 68.0 in | Wt 187.2 lb

## 2024-03-26 DIAGNOSIS — R413 Other amnesia: Secondary | ICD-10-CM | POA: Diagnosis not present

## 2024-03-26 DIAGNOSIS — Z79899 Other long term (current) drug therapy: Secondary | ICD-10-CM

## 2024-03-26 DIAGNOSIS — Z1329 Encounter for screening for other suspected endocrine disorder: Secondary | ICD-10-CM | POA: Diagnosis not present

## 2024-03-26 DIAGNOSIS — I739 Peripheral vascular disease, unspecified: Secondary | ICD-10-CM | POA: Diagnosis not present

## 2024-03-26 DIAGNOSIS — F09 Unspecified mental disorder due to known physiological condition: Secondary | ICD-10-CM

## 2024-03-26 NOTE — Progress Notes (Signed)
 Subjective:    Patient ID: Jim [name], male    DOB: 1956/03/05, 68 y.o.   MRN: [id number]  HPI History of Present Illness  Jim [name] is a 68 year old male who presents with cognitive concerns and frequent falls.  Over the past year, he has experienced frequent falls, including incidents such as falling off the back porch due to a snake encounter, slipping in the chicken run when it was muddy, and tripping over a heavy door at a home improvement store. No loss of consciousness has occurred during these falls.  He reports persistent fatigue and muscle weakness, feeling tired throughout the day and needing to rest after short walks, such as from the car to the clinic. No shortness of breath is noted, and he attributes the need to rest to muscle weakness.  He has dry skin and a rash on his legs, described as feeling like 'an alligator's back'. Daily use of Cerave psoriasis cream has improved the condition. He also has a peeling scar with some thickening.  He experiences burning sensations on the bottom of his feet and acknowledges having pinched nerves in his back. He acknowledges having pinched nerves.  Cognitive concerns include short-term memory issues, such as forgetting tasks or conversations. [name] notes he often forgets to feed the dogs or remember items not on a shopping list. He sometimes repeats questions about plans discussed earlier in the day, and he reports a decline in memory over the past few months.  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 He participates in shopping and driving, although [name] does most of the driving. He acknowledges some memory lapses but denies forgetting conversations shortly after they occur.    Review of Systems     Objective:   Physical Exam General-in no acute distress Eyes-no discharge Lungs-respiratory rate normal, CTA CV-no murmurs,RRR Extremities skin  warm dry no edema Neuro grossly normal Behavior normal, alert  Montreal cognitive assessment 23 out of 30       Assessment & Plan:   Cognitive dysfunction with short-term memory impairment Cognitive dysfunction with short-term memory impairment over the past few months. Differential includes mini strokes, vitamin B12 deficiency, and thyroid  dysfunction. - Order MRI of the brain without contrast to evaluate for mini strokes. - Order blood tests to check B12 and thyroid  levels. - Schedule follow-up in six months to reassess cognitive function. - Advise to monitor for signs of worsening cognitive function, such as getting lost while driving or unusual behavior.  Falls Multiple falls this year attributed to balance issues and environmental factors. Increased risk of injury with age. - Advise to be cautious with balance and avoid situations that may lead to falls. - Discuss with pain management doctor if medications may be contributing to balance issues.  Peripheral vascular disease with dry skin and dermatitis of lower extremities Dry skin and dermatitis on lower extremities, likely exacerbated by peripheral vascular disease. Risk of skin cracking and infection if not managed. - Recommend daily use of Cerave psoriasis cream and a good moisturizer twice a day. - Advise to consider using a humidifier in the winter to prevent skin dryness. - Educate on signs of localized infection, such as bright red areas that are tender and painful.  Burning sensation in feet (possible neuropathy) Burning sensation in feet, possibly due to neuropathy or pinched nerves in the back. Further evaluation needed to determine cause.  Tobacco  use Current tobacco use at one pack per day. Discussed benefits of reducing smoking to half a pack per day or cessation if possible. - Encourage reduction of smoking to half a pack per day or cessation if possible.  General Health Maintenance Discussed lifestyle  modifications to improve overall health, including smoking reduction and alcohol  moderation. - Advise to minimize alcohol  consumption due to potential interactions with medications and negative health effects. - Suggest considering non-alcoholic beer options.

## 2024-03-29 DIAGNOSIS — L570 Actinic keratosis: Secondary | ICD-10-CM | POA: Diagnosis not present

## 2024-03-29 DIAGNOSIS — R202 Paresthesia of skin: Secondary | ICD-10-CM | POA: Diagnosis not present

## 2024-03-30 DIAGNOSIS — Z1329 Encounter for screening for other suspected endocrine disorder: Secondary | ICD-10-CM | POA: Diagnosis not present

## 2024-03-30 DIAGNOSIS — R413 Other amnesia: Secondary | ICD-10-CM | POA: Diagnosis not present

## 2024-03-31 ENCOUNTER — Ambulatory Visit: Payer: Self-pay | Admitting: Family Medicine

## 2024-03-31 LAB — TSH+FREE T4
Free T4: 1.22 ng/dL (ref 0.82–1.77)
TSH: 2.29 u[IU]/mL (ref 0.450–4.500)

## 2024-03-31 LAB — VITAMIN B12: Vitamin B-12: 444 pg/mL (ref 232–1245)

## 2024-04-03 DIAGNOSIS — S2249XA Multiple fractures of ribs, unspecified side, initial encounter for closed fracture: Secondary | ICD-10-CM | POA: Diagnosis not present

## 2024-04-03 DIAGNOSIS — W1830XD Fall on same level, unspecified, subsequent encounter: Secondary | ICD-10-CM | POA: Diagnosis not present

## 2024-04-03 DIAGNOSIS — G894 Chronic pain syndrome: Secondary | ICD-10-CM | POA: Diagnosis not present

## 2024-04-05 ENCOUNTER — Ambulatory Visit (HOSPITAL_COMMUNITY)
Admission: RE | Admit: 2024-04-05 | Discharge: 2024-04-05 | Disposition: A | Discharge status: Home / Self Care | Source: Ambulatory Visit | Attending: Family Medicine | Admitting: Family Medicine

## 2024-04-05 DIAGNOSIS — F09 Unspecified mental disorder due to known physiological condition: Secondary | ICD-10-CM | POA: Diagnosis present

## 2024-04-05 DIAGNOSIS — R413 Other amnesia: Secondary | ICD-10-CM

## 2024-04-16 ENCOUNTER — Telehealth: Payer: Self-pay

## 2024-04-16 DIAGNOSIS — G894 Chronic pain syndrome: Secondary | ICD-10-CM

## 2024-04-16 NOTE — Telephone Encounter (Signed)
 Patient wife called requesting a referral to new pain management.  Referral placed.

## 2024-04-17 ENCOUNTER — Ambulatory Visit: Payer: Self-pay

## 2024-04-17 ENCOUNTER — Ambulatory Visit (INDEPENDENT_AMBULATORY_CARE_PROVIDER_SITE_OTHER): Admitting: Physician Assistant

## 2024-04-17 ENCOUNTER — Encounter: Payer: Self-pay | Admitting: Physician Assistant

## 2024-04-17 ENCOUNTER — Telehealth: Payer: Self-pay

## 2024-04-17 ENCOUNTER — Ambulatory Visit (HOSPITAL_COMMUNITY)
Admission: RE | Admit: 2024-04-17 | Discharge: 2024-04-17 | Disposition: A | Discharge status: Home / Self Care | Source: Ambulatory Visit | Attending: Physician Assistant | Admitting: Physician Assistant

## 2024-04-17 VITALS — BP 135/74 | HR 69 | Temp 97.3°F | Ht 68.0 in | Wt 202.0 lb

## 2024-04-17 DIAGNOSIS — M25551 Pain in right hip: Secondary | ICD-10-CM | POA: Diagnosis not present

## 2024-04-17 DIAGNOSIS — M19011 Primary osteoarthritis, right shoulder: Secondary | ICD-10-CM | POA: Diagnosis not present

## 2024-04-17 DIAGNOSIS — M25511 Pain in right shoulder: Secondary | ICD-10-CM

## 2024-04-17 DIAGNOSIS — M1611 Unilateral primary osteoarthritis, right hip: Secondary | ICD-10-CM | POA: Diagnosis not present

## 2024-04-17 NOTE — Assessment & Plan Note (Signed)
 Severe right shoulder pain affecting range of motion, worsening post-fall, unrelieved by current medication. - Order x-ray of right shoulder. - Continue oxycodone  and Tylenol . - Advise use of ice and heat. - Instruct to report worsening symptoms or increased frequency of falls.

## 2024-04-17 NOTE — Telephone Encounter (Signed)
 Copied from CRM [id number]. Topic: General - Other >> Apr 17, 2024  8:46 AM Charlet HERO wrote: Reason for CRM: Patient is calling bc his pain clinic is moving an hour away and he would like to have a new pain clinic found for him by Dr. Alphonsa. Please call the patient back with suggestions.

## 2024-04-17 NOTE — Progress Notes (Signed)
 Acute Office Visit  Subjective:     Patient ID: Jim [name], male    DOB: 05-04-1956, 68 y.o.   MRN: [id number]   Discussed the use of AI scribe software for clinical note transcription with the patient, who gave verbal consent to proceed.  History of Present Illness Jim [name] is a 68 year old male who presents with worsening right hip and shoulder pain following a fall.  He experiences severe right hip pain radiating from the front and side to the top of the knee, worsening since a fall two weeks ago. The pain is intermittent, occurring several times a day, and causes significant discomfort while sitting or lying down. There is no numbness or tingling in the foot.  The fall occurred when he landed on his left side, but the pain is primarily on the right. A previous fall was attributed to his right leg turning outwards, causing tripping, and this issue has been worsening.  He takes oxycodone  20 mg four times a day and Tylenol  as needed, but these do not alleviate his pain. He cannot take ibuprofen due to stomach upset.  He also experiences significant right shoulder pain, which began after the fall. The pain is intermittent and severe enough to wake him from sleep. He has difficulty moving his shoulder and cannot lift a coffee pot with his right arm. There is no numbness or tingling in the hand. No lower back pain is present.   Review of Systems  Constitutional:  Negative for fever, malaise/fatigue and weight loss.  Musculoskeletal:  Positive for falls and joint pain. Negative for back pain, myalgias and neck pain.  Neurological:  Negative for dizziness, loss of consciousness and headaches.        Objective:     BP 135/74   Pulse 69   Temp (!) 97.3 F (36.3 C)   Ht 5' 8 (1.727 m)   Wt 202 lb (91.6 kg)   SpO2 93%   BMI 30.71 kg/m   Physical Exam Constitutional:      Appearance: Normal appearance. He is obese. He is ill-appearing (chronically ill appearing, in  no acute distress).  HENT:     Head: Normocephalic and atraumatic.     Mouth/Throat:     Mouth: Mucous membranes are moist.     Pharynx: Oropharynx is clear.  Eyes:     Extraocular Movements: Extraocular movements intact.     Conjunctiva/sclera: Conjunctivae normal.  Cardiovascular:     Rate and Rhythm: Normal rate and regular rhythm.     Heart sounds: Normal heart sounds. No murmur heard. Pulmonary:     Effort: Pulmonary effort is normal.     Breath sounds: Normal breath sounds.  Musculoskeletal:     Right shoulder: Tenderness present. No swelling, deformity or bony tenderness. Decreased range of motion. Normal strength.     Right hip: Tenderness (lateral tenderness) present. Decreased range of motion.     Right lower leg: No edema.     Left lower leg: No edema.     Comments: Bilateral lower extremity stasis dermatitis  Skin:    General: Skin is warm and dry.  Neurological:     General: No focal deficit present.     Mental Status: He is alert and oriented to person, place, and time.  Psychiatric:        Mood and Affect: Mood normal.        Behavior: Behavior normal.     No results found for any  visits on 04/17/24.      Assessment & Plan:  Acute right hip pain Assessment & Plan: Severe right hip pain radiating to the knee, worsening post-fall, unrelieved by current medication. - Order x-ray of right hip. - Continue oxycodone  and Tylenol . - Advise use of ice and heat. - Instruct to report worsening symptoms or increased frequency of falls.  Orders: -     DG HIP UNILAT W OR W/O PELVIS 2-3 VIEWS RIGHT  Acute pain of right shoulder Assessment & Plan: Severe right shoulder pain affecting range of motion, worsening post-fall, unrelieved by current medication. - Order x-ray of right shoulder. - Continue oxycodone  and Tylenol . - Advise use of ice and heat. - Instruct to report worsening symptoms or increased frequency of falls.  Orders: -     DG Shoulder  Right   Return if symptoms worsen or fail to improve.  Charmaine Erykah Lippert, PA-C

## 2024-04-17 NOTE — Telephone Encounter (Signed)
 FYI Only or Action Required?: FYI only for provider.  Patient was last seen in primary care on 03/26/2024 by Alphonsa Glendia LABOR, MD.  Called Nurse Triage reporting Hip Pain.  Symptoms began several months ago.  Interventions attempted: Prescription medications: Oxycodone  as prescribed.  Symptoms are: gradually worsening.  Triage Disposition: See HCP Within 4 Hours (Or PCP Triage)  Patient/caregiver understands and will follow disposition?: Yes       Copied from CRM [id number]. Topic: Clinical - Red Word Triage >> Apr 17, 2024  8:48 AM Charlet HERO wrote: Red Word that prompted transfer to Nurse Triage: Patient is stating that he has severe pain in his right hip for a couple of months, he states that he fell on it twice . Reason for Disposition  [1] SEVERE pain (e.g., excruciating, unable to do any normal activities) AND [2] not improved after 2 hours of pain medicine  Answer Assessment - Initial Assessment Questions 1. LOCATION and RADIATION: Where is the pain located? Does the pain spread (shoot) anywhere else?     Right hip, down leg 2. QUALITY: What does the pain feel like?  (e.g., sharp, dull, aching, burning)     All of the above 3. SEVERITY: How bad is the pain? What does it keep you from doing?   (Scale 1-10; or mild, moderate, severe)     10/10 4. ONSET: When did the pain start? Does it come and go, or is it there all the time?     X 2-3 months following, worsening since  6. CAUSE: What do you think is causing the hip pain?      Possibly due to fall 7. AGGRAVATING FACTORS: What makes the hip pain worse? (e.g., walking, climbing stairs, running)     Intermittent 8. OTHER SYMPTOMS: Do you have any other symptoms? (e.g., back pain, pain shooting down leg,  fever, rash)     Pain shooting down leg  Protocols used: Hip Pain-A-AH

## 2024-04-17 NOTE — Telephone Encounter (Signed)
 He may have a referral for pain management but I cannot control the location of pain management.  Perhaps the referral team can try to find a pain management clinic closer.  In the meantime he should stick with who he has because our clinic does not prescribe that these levels please go ahead with referral for chronic pain

## 2024-04-17 NOTE — Assessment & Plan Note (Signed)
 Severe right hip pain radiating to the knee, worsening post-fall, unrelieved by current medication. - Order x-ray of right hip. - Continue oxycodone  and Tylenol . - Advise use of ice and heat. - Instruct to report worsening symptoms or increased frequency of falls.

## 2024-04-17 NOTE — Telephone Encounter (Signed)
 Appointment scheduled.

## 2024-04-18 ENCOUNTER — Ambulatory Visit: Payer: Self-pay | Admitting: Physician Assistant

## 2024-04-25 ENCOUNTER — Telehealth: Payer: Self-pay

## 2024-04-25 NOTE — Telephone Encounter (Signed)
 Nurses 1.  Please read message to the referral team to see where this stands 2.  Please let the patient know that referral team is trying May want to consider [facility] as an option Referral was already placed back on 15 September. Until he has someone else who will take this over I recommend that he continue with his current pain management thank you

## 2024-04-25 NOTE — Telephone Encounter (Signed)
 Copied from CRM #[id number]. Topic: Referral - Status >> Apr 25, 2024 10:41 AM DeAngela L wrote: Reason for CRM: patient calling to check and see if the office has been able to find him a pain management clinic closer since he has not heard anything additional from the office    Patient has one more appointment on 05/02/2024 the office has moved to [place] that's 2 hours and the patient can't keep driving this far so he will attend his last appointment on 05/02/2024  Pt num [phone] (M)

## 2024-04-26 ENCOUNTER — Other Ambulatory Visit: Payer: Self-pay | Admitting: Family Medicine

## 2024-05-02 DIAGNOSIS — G4701 Insomnia due to medical condition: Secondary | ICD-10-CM | POA: Diagnosis not present

## 2024-05-02 DIAGNOSIS — G894 Chronic pain syndrome: Secondary | ICD-10-CM | POA: Diagnosis not present

## 2024-05-02 DIAGNOSIS — M47816 Spondylosis without myelopathy or radiculopathy, lumbar region: Secondary | ICD-10-CM | POA: Diagnosis not present

## 2024-05-02 DIAGNOSIS — S2249XA Multiple fractures of ribs, unspecified side, initial encounter for closed fracture: Secondary | ICD-10-CM | POA: Diagnosis not present

## 2024-05-02 DIAGNOSIS — Z79891 Long term (current) use of opiate analgesic: Secondary | ICD-10-CM | POA: Diagnosis not present

## 2024-05-04 ENCOUNTER — Encounter: Payer: Self-pay | Admitting: Physician Assistant

## 2024-05-04 NOTE — Telephone Encounter (Signed)
 Nurses Please go ahead with the referral that patient mentioned for chronic pain thank you

## 2024-05-07 ENCOUNTER — Other Ambulatory Visit: Payer: Self-pay

## 2024-05-07 DIAGNOSIS — M25511 Pain in right shoulder: Secondary | ICD-10-CM

## 2024-05-07 DIAGNOSIS — G894 Chronic pain syndrome: Secondary | ICD-10-CM

## 2024-05-07 DIAGNOSIS — E782 Mixed hyperlipidemia: Secondary | ICD-10-CM

## 2024-05-07 DIAGNOSIS — M25551 Pain in right hip: Secondary | ICD-10-CM

## 2024-05-09 ENCOUNTER — Other Ambulatory Visit: Payer: Self-pay | Admitting: Cardiology

## 2024-05-09 DIAGNOSIS — E782 Mixed hyperlipidemia: Secondary | ICD-10-CM

## 2024-05-09 MED ORDER — ROSUVASTATIN CALCIUM 5 MG PO TABS: 5.0000 mg | ORAL_TABLET | Freq: Every day | ORAL | 0 refills | Status: AC

## 2024-05-14 ENCOUNTER — Telehealth: Payer: Self-pay | Admitting: Family Medicine

## 2024-05-14 NOTE — Telephone Encounter (Signed)
 duplicate

## 2024-05-14 NOTE — Telephone Encounter (Signed)
 Copied from CRM #[id number]. Topic: Referral - Status >> May 14, 2024  3:14 PM Olam RAMAN wrote: Reason for CRM: waiting for a pain clinic referral. [facility]. In [place]  [phone]

## 2024-05-15 NOTE — Telephone Encounter (Signed)
Message sent to referral coordinator

## 2024-05-15 NOTE — Telephone Encounter (Signed)
 Referral has been sent to [facility] in [place] as Patient requested.  MyChart Message sent to Patient with Specialty Office contact information.

## 2024-05-16 ENCOUNTER — Encounter (INDEPENDENT_AMBULATORY_CARE_PROVIDER_SITE_OTHER): Payer: Self-pay | Admitting: Gastroenterology

## 2024-05-23 ENCOUNTER — Telehealth: Payer: Self-pay | Admitting: Family Medicine

## 2024-05-23 NOTE — Telephone Encounter (Signed)
 Referral has been refaxed to fax numbers provided as requested.

## 2024-05-23 NOTE — Telephone Encounter (Unsigned)
 Copied from CRM [id number]. Topic: Referral - Status >> May 23, 2024  1:26 PM Tiffini S wrote: Reason for CRM: Powell with [facility] called about a referral for pain management/  chronic pain syndrome   Please fax referral to: [phone] or [phone] Their fax machine was down and is back working

## 2024-05-30 DIAGNOSIS — G5601 Carpal tunnel syndrome, right upper limb: Secondary | ICD-10-CM | POA: Diagnosis not present

## 2024-05-30 DIAGNOSIS — G894 Chronic pain syndrome: Secondary | ICD-10-CM | POA: Diagnosis not present

## 2024-05-30 DIAGNOSIS — M47816 Spondylosis without myelopathy or radiculopathy, lumbar region: Secondary | ICD-10-CM | POA: Diagnosis not present

## 2024-05-30 DIAGNOSIS — G5602 Carpal tunnel syndrome, left upper limb: Secondary | ICD-10-CM | POA: Diagnosis not present

## 2024-05-30 DIAGNOSIS — M25551 Pain in right hip: Secondary | ICD-10-CM | POA: Diagnosis not present

## 2024-06-22 ENCOUNTER — Telehealth: Payer: Self-pay

## 2024-06-22 NOTE — Telephone Encounter (Signed)
 Copied from CRM [id number]. Topic: Clinical - Medication Question >> Jun 22, 2024  3:21 PM Shanda MATSU wrote: Reason for CRM: Alyssa w/[facility] CB: [phone] called in wanting to know how long the patient needs to be off their blood thinner med, clopidogrel  (PLAVIX ) 75 MG tablet, before they can undergo dental extraction.

## 2024-06-25 ENCOUNTER — Encounter: Payer: Self-pay | Admitting: Family Medicine

## 2024-06-25 NOTE — Telephone Encounter (Signed)
 Under current guidelines that we follow a person does not have to come off of Plavix  to have the tooth pulled.  In those situations local hemostasis techniques would be applicable.  If in the opinion of the dentist the Plavix  needs to be stopped due to the complexity of the issue and concern for excessive bleeding-then it is recommended to stop Plavix  5 days prior and then resume the Plavix  within 24 hours once bleeding is under control.  There is a small theoretical risk of vascular thrombosis off of the Plavix  and whether or not to come off of Plavix  as a shared discussion between the dentist and the patient.  (Nurses-please relay this message to the dental office.  If they need this in the letter form please let me know.  I will be sending a copy of this to the patient via MyChart.)

## 2024-06-27 NOTE — Telephone Encounter (Signed)
 Called the dentist office back and they will be taking him off Plavix . I told Alyssa, that he would need to be taken off that medication 5 days prior and that he will get back on the pill within 24 hours once bleeding is under control. She verbalized she understood.   They do need a letter faxed to them stating this please.   Fax number is [phone] at [facility].

## 2024-07-02 ENCOUNTER — Encounter: Payer: Self-pay | Admitting: Family Medicine

## 2024-07-02 NOTE — Telephone Encounter (Signed)
 Letter was dictated-please forward to the dentist

## 2024-07-02 NOTE — Progress Notes (Signed)
 Please see telephone message-please make sure that this letter is forwarded to the dentist thank you

## 2024-07-06 ENCOUNTER — Ambulatory Visit: Payer: 59

## 2024-07-06 VITALS — Ht 68.0 in | Wt 202.0 lb

## 2024-07-06 DIAGNOSIS — Z0000 Encounter for general adult medical examination without abnormal findings: Secondary | ICD-10-CM | POA: Diagnosis not present

## 2024-07-06 NOTE — Patient Instructions (Signed)
 Mr. Jim [name],  Thank you for taking the time for your Medicare Wellness Visit. I appreciate your continued commitment to your health goals. Please review the care plan we discussed, and feel free to reach out if I can assist you further.  Please note that Annual Wellness Visits do not include a physical exam. Some assessments may be limited, especially if the visit was conducted virtually. If needed, we may recommend an in-person follow-up with your provider.  Ongoing Care Seeing your primary care provider every 3 to 6 months helps us  monitor your health and provide consistent, personalized care.   Referrals If a referral was made during today's visit and you haven't received any updates within two weeks, please contact the referred provider directly to check on the status.  Recommended Screenings:  Health Maintenance  Topic Date Due   Zoster (Shingles) Vaccine (1 of 2) Never done   Screening for Lung Cancer  Never done   Complete foot exam   11/01/2023   Hemoglobin A1C  04/06/2024   DTaP/Tdap/Td vaccine (2 - Td or Tdap) 10/24/2024*   Flu Shot  10/30/2024*   Eye exam for diabetics  08/01/2024   Yearly kidney health urinalysis for diabetes  10/04/2024   Yearly kidney function blood test for diabetes  11/29/2024   Medicare Annual Wellness Visit  07/06/2025   Cologuard (Stool DNA test)  07/19/2025   Pneumococcal Vaccine for age over 90  Completed   Hepatitis C Screening  Completed   Meningitis B Vaccine  Aged Out   Colon Cancer Screening  Discontinued   COVID-19 Vaccine  Discontinued  *Topic was postponed. The date shown is not the original due date.       07/06/2024    9:02 AM  Advanced Directives  Does Patient Have a Medical Advance Directive? No  Would patient like information on creating a medical advance directive? Yes (MAU/Ambulatory/Procedural Areas - Information given)   Information on Advanced Care Planning can be found at [place]  Secretary of [facility] Advance Health  Care Directives Advance Health Care Directives ([email or web address]   Vision: Annual vision screenings are recommended for early detection of glaucoma, cataracts, and diabetic retinopathy. These exams can also reveal signs of chronic conditions such as diabetes and high blood pressure.  Dental: Annual dental screenings help detect early signs of oral cancer, gum disease, and other conditions linked to overall health, including heart disease and diabetes.  Please see the attached documents for additional preventive care recommendations.

## 2024-07-06 NOTE — Progress Notes (Signed)
 Chief Complaint  Patient presents with   Medicare Wellness     Subjective:   Jim [name] is a 68 y.o. male who presents for a Medicare Annual Wellness Visit.  Visit info / Clinical Intake: Medicare Wellness Visit Type:: Subsequent Annual Wellness Visit Persons participating in visit and providing information:: patient Medicare Wellness Visit Mode:: Telephone If telephone:: video declined Since this visit was completed virtually, some vitals may be partially provided or unavailable. Missing vitals are due to the limitations of the virtual format.: Documented vitals are patient reported If Telephone or Video please confirm:: I connected with patient using audio/video enable telemedicine. I verified patient identity with two identifiers, discussed telehealth limitations, and patient agreed to proceed. Patient Location:: home Provider Location:: home office Interpreter Needed?: No Pre-visit prep was completed: yes AWV questionnaire completed by patient prior to visit?: no Living arrangements:: lives with spouse/significant other Patient's Overall Health Status Rating: good Typical amount of pain: (!) a lot Does pain affect daily life?: (!) yes Are you currently prescribed opioids?: (!) yes  Dietary Habits and Nutritional Risks How many meals a day?: 3 Eats fruit and vegetables daily?: yes Most meals are obtained by: having others provide food In the last 2 weeks, have you had any of the following?: none Diabetic:: (!) yes Any non-healing wounds?: no How often do you check your BS?: 1 Would you like to be referred to a Nutritionist or for Diabetic Management? : no  Functional Status Activities of Daily Living (to include ambulation/medication): Independent Ambulation: Independent Medication Administration: Independent Home Management (perform basic housework or laundry): Independent Manage your own finances?: yes Primary transportation is: driving Concerns about vision?:  no *vision screening is required for WTM* Concerns about hearing?: no  Fall Screening Falls in the past year?: 1 Number of falls in past year: 1 Was there an injury with Fall?: 0 Fall Risk Category Calculator: 2 Patient Fall Risk Level: Moderate Fall Risk  Fall Risk Patient at Risk for Falls Due to: History of fall(s); Medication side effect Fall risk Follow up: Falls evaluation completed; Education provided; Falls prevention discussed  Home and Transportation Safety: All rugs have non-skid backing?: yes All stairs or steps have railings?: yes Grab bars in the bathtub or shower?: yes Have non-skid surface in bathtub or shower?: yes Good home lighting?: yes Regular seat belt use?: yes Hospital stays in the last year:: no  Cognitive Assessment Difficulty concentrating, remembering, or making decisions? : yes Will 6CIT or Mini Cog be Completed: no 6CIT or Mini Cog Declined: patient declined Was the patient able to repeat memory words in 3 tries?: yes Which version was used?: Version1 : banana, sunrise, chair Clock numbers correct?: yes Clock time correct (11:10)?: yes Normal clock drawing test?: 2 How many words correct?: 2 Which version was used?: Version 1: banana, sunrise, chair Mini-Cog Scoring: 4  Advance Directives (For Healthcare) Does Patient Have a Medical Advance Directive?: No Would patient like information on creating a medical advance directive?: Yes (MAU/Ambulatory/Procedural Areas - Information given)  Reviewed/Updated  Reviewed/Updated: Reviewed All (Medical, Surgical, Family, Medications, Allergies, Care Teams, Patient Goals)    Allergies (verified) Doxycycline  and Pravastatin   Current Medications (verified) Outpatient Encounter Medications as of 07/06/2024  Medication Sig   acetaminophen  (TYLENOL ) 500 MG tablet Take 500-1,000 mg by mouth every 6 (six) hours as needed (pain.).   albuterol  (VENTOLIN  HFA) 108 (90 Base) MCG/ACT inhaler Inhale 2 puffs  into the lungs every 6 (six) hours as needed for wheezing.  baclofen  (LIORESAL ) 10 MG tablet Take 0.5-1 tablets (5-10 mg total) by mouth 3 (three) times daily as needed.   blood glucose meter kit and supplies Dispense based on patient and insurance preference. Use to check glucose once daily as directed. (FOR ICD-10 E11.9).   buPROPion  (WELLBUTRIN  XL) 150 MG 24 hr tablet TAKE 1 TABLET BY MOUTH EVERY DAY   clopidogrel  (PLAVIX ) 75 MG tablet TAKE 1 TABLET BY MOUTH EVERY DAY   ezetimibe  (ZETIA ) 10 MG tablet TAKE 1 TABLET BY MOUTH EVERY DAY   FLUoxetine  (PROZAC ) 20 MG capsule TAKE 3 CAPSULES BY MOUTH EVERY DAY   gabapentin  (NEURONTIN ) 400 MG capsule Take 400 mg by mouth 2 (two) times daily.   glipiZIDE  (GLUCOTROL  XL) 2.5 MG 24 hr tablet TAKE 1 TABLET BY MOUTH EVERY DAY IN THE MORNING   glucose blood (ONETOUCH VERIO) test strip USE AS INSTRUCTED   JARDIANCE  10 MG TABS tablet TAKE 1 TABLET BY MOUTH DAILY BEFORE BREAKFAST.   Lancets (ONETOUCH ULTRASOFT) lancets    naloxone  (NARCAN ) nasal spray 4 mg/0.1 mL Use as directed   Oxycodone  HCl 20 MG TABS Take 20 mg by mouth every 4 (four) hours as needed (pain.). Take 1 tablet by mouth 4-5 times daily if tolerated   rosuvastatin  (CRESTOR ) 5 MG tablet Take 1 tablet (5 mg total) by mouth daily.   traZODone  (DESYREL ) 50 MG tablet TAKE 1 TABLET BY MOUTH EVERY DAY AT BEDTIME AS NEEDED FOR INSOMNIA   No facility-administered encounter medications on file as of 07/06/2024.    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 Left > Right   Esophageal ulcer    Ex-smoker    Quit 11/07/2011   Gastric ulcer    GERD (gastroesophageal reflux disease)    High cholesterol    History of colon polyps    Hyperlipidemia    Hypertension    Primary localized osteoarthritis of right  knee    PVD (peripheral vascular disease) with claudication 05/21/2018   Under the care of vascular surgery   Sleep apnea    Past Surgical History:  Procedure Laterality Date   ABDOMINAL AORTOGRAM W/LOWER EXTREMITY N/A 05/19/2018   Procedure: ABDOMINAL AORTOGRAM W/LOWER EXTREMITY;  Surgeon: Harvey Carlin BRAVO, MD;  Location: [facility] INVASIVE [facility];  Service: Cardiovascular;  Laterality: N/A;   ABDOMINAL AORTOGRAM W/LOWER EXTREMITY Right 12/08/2022   Procedure: ABDOMINAL AORTOGRAM W/LOWER EXTREMITY;  Surgeon: Lanis Fonda BRAVO, MD;  Location: [facility] INVASIVE CV LAB;  Service: Cardiovascular;  Laterality: Right;   BIOPSY  07/08/2022   Procedure: BIOPSY;  Surgeon: Eartha Angelia Sieving, MD;  Location: [facility];  Service: Gastroenterology;;   CARPAL TUNNEL RELEASE     right   CARPAL TUNNEL RELEASE Left 05/16/13   Dr Jane   COLONOSCOPY  01/31/2012   Procedure: COLONOSCOPY;  Surgeon: Lamar CHRISTELLA Hollingshead, MD;  Location: [facility];  Service: Endoscopy;  Laterality: N/A;  7:30 AM   ESOPHAGEAL DILATION N/A 07/08/2022   Procedure: ESOPHAGEAL DILATION;  Surgeon: Eartha Angelia Sieving, MD;  Location: [facility];  Service: Gastroenterology;  Laterality: N/A;   ESOPHAGOGASTRODUODENOSCOPY (EGD) WITH PROPOFOL  N/A 07/08/2022   Procedure: ESOPHAGOGASTRODUODENOSCOPY (EGD) WITH PROPOFOL ;  Surgeon: Eartha Angelia Sieving, MD;  Location: [facility];  Service: Gastroenterology;  Laterality: N/A;   FEMORAL-TIBIAL BYPASS GRAFT Right 12/13/2022   Procedure: RIGHT COMMON FEMORAL-POSTERIOR TIBIAL ARTERY BYPASS WITH VEIN;  Surgeon: Lanis Fonda BRAVO, MD;  Location: [facility] OR;  Service: Vascular;  Laterality: Right;   FINGER AMPUTATION     partial left 5th finger   JOINT REPLACEMENT     KNEE ARTHROSCOPY     right knee   ORIF FINGER FRACTURE     Right 5th finger, Amputation reatachment   PERIPHERAL VASCULAR INTERVENTION Right 05/19/2018   Procedure: PERIPHERAL VASCULAR INTERVENTION;  Surgeon: Harvey Carlin BRAVO, MD;   Location: [facility];  Service: Cardiovascular;  Laterality: Right;  right popliteal   TONSILLECTOMY     TOTAL KNEE ARTHROPLASTY  09/04/2012   Procedure: TOTAL KNEE ARTHROPLASTY;  Surgeon: Lamar DELENA Millman, MD;  Location: [facility] OR;  Service: Orthopedics;  Laterality: Left;   TOTAL KNEE ARTHROPLASTY Right 07/21/2015   Procedure: RIGHT TOTAL KNEE ARTHROPLASTY;  Surgeon: Lamar Millman, MD;  Location: [facility] OR;  Service: Orthopedics;  Laterality: Right;   Family History  Problem Relation Age of Onset   Heart disease Mother    Heart disease Father    Diabetes type I Father    Hypertension Father    Cancer Father    Diabetes Father    Heart disease Sister    Heart disease Brother    Colon cancer Neg Hx    Social History   Occupational History   Occupation: disabled    Employer: OTHER  Tobacco Use   Smoking status: Every Day    Current packs/day: 1.00    Average packs/day: 1.2 packs/day for 70.0 years (85.0 ttl pk-yrs)    Types: Cigarettes    Passive exposure: Current   Smokeless tobacco: Never  Vaping Use   Vaping status: Never Used  Substance and Sexual Activity   Alcohol  use: Not Currently    Comment: occasionally   Drug use: No   Sexual activity: Not Currently   Tobacco Counseling Ready to quit: Not Answered Counseling given: Not Answered  SDOH Screenings   Food Insecurity: Food Insecurity Present (07/06/2024)  Housing: Low Risk  (07/06/2024)  Transportation Needs: No Transportation Needs (07/06/2024)  Utilities: Not At Risk (07/06/2024)  Alcohol  Screen: Low Risk  (03/05/2024)  Depression (PHQ2-9): Medium Risk (07/06/2024)  Financial Resource Strain: Medium Risk (03/05/2024)  Physical Activity: Inactive (07/06/2024)  Social Connections: Moderately Isolated (07/06/2024)  Stress: No Stress Concern Present (07/06/2024)  Tobacco Use: High Risk (07/06/2024)  Health Literacy: Adequate Health Literacy (07/06/2024)   See flowsheets for full screening details  Depression Screen PHQ  2 & 9 Depression Scale- Over the past 2 weeks, how often have you been bothered by any of the following problems? Little interest or pleasure in doing things: 1 Feeling down, depressed, or hopeless (PHQ Adolescent also includes...irritable): 1 PHQ-2 Total Score: 2 Trouble falling or staying asleep, or sleeping too much: 2 Feeling tired or having little energy: 2 Poor appetite or overeating (PHQ Adolescent also includes...weight loss): 0 Feeling bad about yourself - or that you are a failure or have let yourself or your family down: 0 Trouble concentrating on things, such as reading the newspaper or watching television (PHQ Adolescent also includes...like school work): 0 Moving or speaking so slowly that other people could have noticed. Or the opposite - being so fidgety or restless that you have been moving around a lot more than usual: 0 Thoughts that you would be better off dead, or of hurting yourself  in some way: 0 PHQ-9 Total Score: 6 If you checked off any problems, how difficult have these problems made it for you to do your work, take care of things at home, or get along with other people?: Somewhat difficult  Depression Treatment Depression Interventions/Treatment : Currently on Treatment; Medication     Goals Addressed             This Visit's Progress    Prevent falls   On track            Objective:    Today's Vitals   07/06/24 0856  Weight: 202 lb (91.6 kg)  Height: 5' 8 (1.727 m)   Body mass index is 30.71 kg/m.  Hearing/Vision screen Hearing Screening - Comments:: Patient is able to hear conversational tones without difficulty. No issues reported.   Vision Screening - Comments:: Wears rx glasses - up to date with routine eye exams with [facility]  Immunizations and Health Maintenance Health Maintenance  Topic Date Due   Zoster Vaccines- Shingrix (1 of 2) Never done   Lung Cancer Screening  Never done   FOOT EXAM  11/01/2023    HEMOGLOBIN A1C  04/06/2024   DTaP/Tdap/Td (2 - Td or Tdap) 10/24/2024 (Originally 03/18/2023)   Influenza Vaccine  10/30/2024 (Originally 03/02/2024)   OPHTHALMOLOGY EXAM  08/01/2024   Diabetic kidney evaluation - Urine ACR  10/04/2024   Diabetic kidney evaluation - eGFR measurement  11/29/2024   Medicare Annual Wellness (AWV)  07/06/2025   Fecal DNA (Cologuard)  07/19/2025   Pneumococcal Vaccine: 50+ Years  Completed   Hepatitis C Screening  Completed   Meningococcal B Vaccine  Aged Out   Colonoscopy  Discontinued   COVID-19 Vaccine  Discontinued        Assessment/Plan:  This is a routine wellness examination for Cris.  Patient Care Team: Alphonsa Glendia LABOR, MD as PCP - General (Family Medicine) [place], [facility] Daune Eleanor HERO, FNP as Nurse Practitioner (Pain Medicine) Eda Baxter, MD as Referring Physician (Dermatology)  I have personally reviewed and noted the following in the patient's chart:   Medical and social history Use of alcohol , tobacco or illicit drugs  Current medications and supplements including opioid prescriptions. Functional ability and status Nutritional status Physical activity Advanced directives List of other physicians Hospitalizations, surgeries, and ER visits in previous 12 months Vitals Screenings to include cognitive, depression, and falls Referrals and appointments  No orders of the defined types were placed in this encounter.  In addition, I have reviewed and discussed with patient certain preventive protocols, quality metrics, and best practice recommendations. A written personalized care plan for preventive services as well as general preventive health recommendations were provided to patient.   Lavelle Charmaine Browner, LPN   [id number]   Return in 1 year (on 07/06/2025).  After Visit Summary: (MyChart) Due to this being a telephonic visit, the after visit summary with patients personalized plan was offered to  patient via MyChart   Nurse Notes: No concerns at this time

## 2024-07-23 ENCOUNTER — Other Ambulatory Visit: Payer: Self-pay | Admitting: Family Medicine

## 2024-07-23 DIAGNOSIS — I10 Essential (primary) hypertension: Secondary | ICD-10-CM

## 2024-07-23 DIAGNOSIS — E1140 Type 2 diabetes mellitus with diabetic neuropathy, unspecified: Secondary | ICD-10-CM

## 2024-07-28 ENCOUNTER — Other Ambulatory Visit: Payer: Self-pay | Admitting: Family Medicine

## 2024-08-31 ENCOUNTER — Telehealth: Payer: Self-pay | Admitting: Family Medicine

## 2024-08-31 NOTE — Telephone Encounter (Signed)
 Copied from CRM #[id number]. Topic: General - Other >> Aug 31, 2024  3:03 PM Tiffini S wrote: Reason for CRM: Patient called asking for the handicap license plate- need the form to be complete Patient is purchasing a new vehicle and will need a additional tag  Please called the patient back at [phone]

## 2024-09-26 ENCOUNTER — Ambulatory Visit: Admitting: Family Medicine
